# Patient Record
Sex: Male | Born: 1935 | Race: White | Hispanic: No | Marital: Married | Smoking: Never smoker
Health system: Southern US, Community
[De-identification: ages and names within clinical notes are randomized; demographics above are authoritative.]

## PROBLEM LIST (undated history)

## (undated) DIAGNOSIS — M1990 Unspecified osteoarthritis, unspecified site: Secondary | ICD-10-CM

## (undated) DIAGNOSIS — E039 Hypothyroidism, unspecified: Secondary | ICD-10-CM

## (undated) DIAGNOSIS — K219 Gastro-esophageal reflux disease without esophagitis: Secondary | ICD-10-CM

## (undated) DIAGNOSIS — I480 Paroxysmal atrial fibrillation: Secondary | ICD-10-CM

## (undated) DIAGNOSIS — N183 Chronic kidney disease, stage 3 unspecified: Secondary | ICD-10-CM

## (undated) DIAGNOSIS — E611 Iron deficiency: Secondary | ICD-10-CM

## (undated) DIAGNOSIS — D509 Iron deficiency anemia, unspecified: Secondary | ICD-10-CM

## (undated) DIAGNOSIS — J45909 Unspecified asthma, uncomplicated: Secondary | ICD-10-CM

## (undated) DIAGNOSIS — I10 Essential (primary) hypertension: Secondary | ICD-10-CM

## (undated) DIAGNOSIS — I2510 Atherosclerotic heart disease of native coronary artery without angina pectoris: Secondary | ICD-10-CM

## (undated) DIAGNOSIS — I739 Peripheral vascular disease, unspecified: Secondary | ICD-10-CM

## (undated) DIAGNOSIS — D649 Anemia, unspecified: Secondary | ICD-10-CM

## (undated) DIAGNOSIS — I442 Atrioventricular block, complete: Secondary | ICD-10-CM

## (undated) DIAGNOSIS — K2970 Gastritis, unspecified, without bleeding: Secondary | ICD-10-CM

## (undated) DIAGNOSIS — K635 Polyp of colon: Secondary | ICD-10-CM

## (undated) HISTORY — DX: Gastritis, unspecified, without bleeding: K29.70

## (undated) HISTORY — DX: Unspecified asthma, uncomplicated: J45.909

## (undated) HISTORY — DX: Iron deficiency anemia, unspecified: D50.9

## (undated) HISTORY — PX: INSERT / REPLACE / REMOVE PACEMAKER: SUR710

## (undated) HISTORY — DX: Peripheral vascular disease, unspecified: I73.9

## (undated) HISTORY — PX: CATARACT EXTRACTION: SUR2

## (undated) HISTORY — DX: Polyp of colon: K63.5

## (undated) HISTORY — PX: HIP SURGERY: SHX245

---
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
## 1994-10-20 HISTORY — PX: JOINT REPLACEMENT: SHX530

## 1998-04-15 ENCOUNTER — Inpatient Hospital Stay (HOSPITAL_COMMUNITY): Admission: EM | Admit: 1998-04-15 | Discharge: 1998-04-18 | Payer: Self-pay | Admitting: Emergency Medicine

## 1998-08-12 ENCOUNTER — Encounter: Payer: Self-pay | Admitting: Emergency Medicine

## 1998-08-12 ENCOUNTER — Inpatient Hospital Stay (HOSPITAL_COMMUNITY): Admission: EM | Admit: 1998-08-12 | Discharge: 1998-08-15 | Payer: Self-pay | Admitting: Emergency Medicine

## 1999-01-18 ENCOUNTER — Inpatient Hospital Stay (HOSPITAL_COMMUNITY): Admission: EM | Admit: 1999-01-18 | Discharge: 1999-01-21 | Payer: Self-pay | Admitting: Emergency Medicine

## 1999-01-18 ENCOUNTER — Encounter: Payer: Self-pay | Admitting: Emergency Medicine

## 1999-02-13 ENCOUNTER — Ambulatory Visit (HOSPITAL_COMMUNITY): Admission: RE | Admit: 1999-02-13 | Discharge: 1999-02-13 | Payer: Self-pay | Admitting: Neurosurgery

## 1999-12-29 ENCOUNTER — Encounter: Payer: Self-pay | Admitting: Emergency Medicine

## 1999-12-30 ENCOUNTER — Encounter: Payer: Self-pay | Admitting: Emergency Medicine

## 1999-12-30 ENCOUNTER — Inpatient Hospital Stay: Admission: EM | Admit: 1999-12-30 | Discharge: 2000-01-05 | Payer: Self-pay | Admitting: Emergency Medicine

## 1999-12-30 ENCOUNTER — Inpatient Hospital Stay (HOSPITAL_COMMUNITY): Admission: EM | Admit: 1999-12-30 | Discharge: 1999-12-30 | Payer: Self-pay | Admitting: Psychiatry

## 2000-01-01 ENCOUNTER — Encounter: Payer: Self-pay | Admitting: Internal Medicine

## 2000-04-09 ENCOUNTER — Encounter: Payer: Self-pay | Admitting: *Deleted

## 2000-04-09 ENCOUNTER — Inpatient Hospital Stay (HOSPITAL_COMMUNITY): Admission: EM | Admit: 2000-04-09 | Discharge: 2000-04-13 | Payer: Self-pay | Admitting: *Deleted

## 2000-04-12 ENCOUNTER — Encounter: Payer: Self-pay | Admitting: Internal Medicine

## 2003-06-21 ENCOUNTER — Inpatient Hospital Stay (HOSPITAL_COMMUNITY): Admission: EM | Admit: 2003-06-21 | Discharge: 2003-06-22 | Payer: Self-pay | Admitting: Emergency Medicine

## 2003-09-22 ENCOUNTER — Ambulatory Visit (HOSPITAL_COMMUNITY): Admission: EM | Admit: 2003-09-22 | Discharge: 2003-09-22 | Payer: Self-pay | Admitting: Emergency Medicine

## 2003-09-23 ENCOUNTER — Observation Stay (HOSPITAL_COMMUNITY): Admission: EM | Admit: 2003-09-23 | Discharge: 2003-09-24 | Payer: Self-pay | Admitting: Emergency Medicine

## 2007-06-28 ENCOUNTER — Ambulatory Visit: Payer: Self-pay | Admitting: Cardiology

## 2007-06-29 ENCOUNTER — Ambulatory Visit: Payer: Self-pay | Admitting: Internal Medicine

## 2007-06-29 ENCOUNTER — Inpatient Hospital Stay (HOSPITAL_COMMUNITY): Admission: EM | Admit: 2007-06-29 | Discharge: 2007-07-04 | Payer: Self-pay | Admitting: Emergency Medicine

## 2007-07-04 ENCOUNTER — Encounter (INDEPENDENT_AMBULATORY_CARE_PROVIDER_SITE_OTHER): Payer: Self-pay | Admitting: *Deleted

## 2008-07-05 ENCOUNTER — Inpatient Hospital Stay (HOSPITAL_COMMUNITY): Admission: EM | Admit: 2008-07-05 | Discharge: 2008-07-11 | Payer: Self-pay | Admitting: Emergency Medicine

## 2010-10-05 LAB — CBC
HCT: 50.8 % (ref 39.0–52.0)
HCT: 36.1 % — ABNORMAL LOW (ref 39.0–52.0)
HCT: 37.5 % — ABNORMAL LOW (ref 39.0–52.0)
HCT: 41.8 % (ref 39.0–52.0)
Hemoglobin: 16.9 g/dL (ref 13.0–17.0)
Hemoglobin: 12.1 g/dL — ABNORMAL LOW (ref 13.0–17.0)
Hemoglobin: 12.8 g/dL — ABNORMAL LOW (ref 13.0–17.0)
Hemoglobin: 14.1 g/dL (ref 13.0–17.0)
MCHC: 33.2 g/dL (ref 30.0–36.0)
MCV: 89 fL (ref 78.0–100.0)
Platelets: 195 10*3/uL (ref 150–400)
Platelets: 143 10*3/uL — ABNORMAL LOW (ref 150–400)
Platelets: 150 10*3/uL (ref 150–400)
Platelets: 232 10*3/uL (ref 150–400)
RBC: 5.71 MIL/uL (ref 4.22–5.81)
RBC: 4.68 MIL/uL (ref 4.22–5.81)
RDW: 12.3 % (ref 11.5–15.5)
WBC: 24 10*3/uL — ABNORMAL HIGH (ref 4.0–10.5)
WBC: 10.8 10*3/uL — ABNORMAL HIGH (ref 4.0–10.5)
WBC: 5.6 10*3/uL (ref 4.0–10.5)
WBC: 7.3 10*3/uL (ref 4.0–10.5)

## 2010-10-05 LAB — URINALYSIS, ROUTINE W REFLEX MICROSCOPIC
Leukocytes, UA: NEGATIVE
Leukocytes, UA: NEGATIVE
Nitrite: NEGATIVE
Nitrite: NEGATIVE
Protein, ur: 100 mg/dL — AB
Specific Gravity, Urine: 1.015 (ref 1.005–1.030)
Specific Gravity, Urine: 1.018 (ref 1.005–1.030)
Urobilinogen, UA: 0.2 mg/dL (ref 0.0–1.0)
Urobilinogen, UA: 1 mg/dL (ref 0.0–1.0)
pH: 6.5 (ref 5.0–8.0)

## 2010-10-05 LAB — GLUCOSE, CAPILLARY
Glucose-Capillary: 140 mg/dL — ABNORMAL HIGH (ref 70–99)
Glucose-Capillary: 156 mg/dL — ABNORMAL HIGH (ref 70–99)
Glucose-Capillary: 102 mg/dL — ABNORMAL HIGH (ref 70–99)
Glucose-Capillary: 105 mg/dL — ABNORMAL HIGH (ref 70–99)
Glucose-Capillary: 106 mg/dL — ABNORMAL HIGH (ref 70–99)
Glucose-Capillary: 107 mg/dL — ABNORMAL HIGH (ref 70–99)
Glucose-Capillary: 109 mg/dL — ABNORMAL HIGH (ref 70–99)
Glucose-Capillary: 109 mg/dL — ABNORMAL HIGH (ref 70–99)
Glucose-Capillary: 110 mg/dL — ABNORMAL HIGH (ref 70–99)
Glucose-Capillary: 111 mg/dL — ABNORMAL HIGH (ref 70–99)
Glucose-Capillary: 112 mg/dL — ABNORMAL HIGH (ref 70–99)
Glucose-Capillary: 113 mg/dL — ABNORMAL HIGH (ref 70–99)
Glucose-Capillary: 114 mg/dL — ABNORMAL HIGH (ref 70–99)
Glucose-Capillary: 127 mg/dL — ABNORMAL HIGH (ref 70–99)
Glucose-Capillary: 139 mg/dL — ABNORMAL HIGH (ref 70–99)
Glucose-Capillary: 159 mg/dL — ABNORMAL HIGH (ref 70–99)
Glucose-Capillary: 166 mg/dL — ABNORMAL HIGH (ref 70–99)
Glucose-Capillary: 96 mg/dL (ref 70–99)
Glucose-Capillary: 97 mg/dL (ref 70–99)

## 2010-10-05 LAB — DIFFERENTIAL
Basophils Absolute: 0 10*3/uL (ref 0.0–0.1)
Basophils Absolute: 0 10*3/uL (ref 0.0–0.1)
Basophils Relative: 0 % (ref 0–1)
Eosinophils Relative: 0 % (ref 0–5)
Lymphocytes Relative: 2 % — ABNORMAL LOW (ref 12–46)
Monocytes Absolute: 0.7 10*3/uL (ref 0.1–1.0)
Monocytes Absolute: 1 10*3/uL (ref 0.1–1.0)
Neutro Abs: 22.7 10*3/uL — ABNORMAL HIGH (ref 1.7–7.7)
Neutro Abs: 8.8 10*3/uL — ABNORMAL HIGH (ref 1.7–7.7)
Neutrophils Relative %: 95 % — ABNORMAL HIGH (ref 43–77)

## 2010-10-05 LAB — URINE CULTURE
Colony Count: NO GROWTH
Culture: NO GROWTH
Special Requests: NEGATIVE

## 2010-10-05 LAB — BASIC METABOLIC PANEL
BUN: 10 mg/dL (ref 6–23)
BUN: 12 mg/dL (ref 6–23)
BUN: 13 mg/dL (ref 6–23)
BUN: 14 mg/dL (ref 6–23)
CO2: 18 mEq/L — ABNORMAL LOW (ref 19–32)
CO2: 20 mEq/L (ref 19–32)
CO2: 21 mEq/L (ref 19–32)
CO2: 23 mEq/L (ref 19–32)
CO2: 24 mEq/L (ref 19–32)
CO2: 22 mEq/L (ref 19–32)
CO2: 23 mEq/L (ref 19–32)
Calcium: 8 mg/dL — ABNORMAL LOW (ref 8.4–10.5)
Calcium: 8.1 mg/dL — ABNORMAL LOW (ref 8.4–10.5)
Calcium: 8.1 mg/dL — ABNORMAL LOW (ref 8.4–10.5)
Calcium: 7.8 mg/dL — ABNORMAL LOW (ref 8.4–10.5)
Calcium: 8.2 mg/dL — ABNORMAL LOW (ref 8.4–10.5)
Calcium: 9.4 mg/dL (ref 8.4–10.5)
Chloride: 89 mEq/L — ABNORMAL LOW (ref 96–112)
Chloride: 92 mEq/L — ABNORMAL LOW (ref 96–112)
Chloride: 94 mEq/L — ABNORMAL LOW (ref 96–112)
Creatinine, Ser: 1.14 mg/dL (ref 0.4–1.5)
Creatinine, Ser: 1.15 mg/dL (ref 0.4–1.5)
Creatinine, Ser: 0.81 mg/dL (ref 0.4–1.5)
Creatinine, Ser: 0.92 mg/dL (ref 0.4–1.5)
Creatinine, Ser: 0.97 mg/dL (ref 0.4–1.5)
GFR calc Af Amer: >60 mL/min (ref >60)
GFR calc Af Amer: >60 mL/min (ref >60)
GFR calc Af Amer: >60 mL/min (ref >60)
GFR calc Af Amer: >60 mL/min (ref >60)
GFR calc Af Amer: >60 mL/min (ref >60)
GFR calc Af Amer: >60 mL/min (ref >60)
GFR calc Af Amer: >60 mL/min (ref >60)
GFR calc non Af Amer: >60 mL/min (ref >60)
GFR calc non Af Amer: >60 mL/min (ref >60)
GFR calc non Af Amer: >60 mL/min (ref >60)
GFR calc non Af Amer: >60 mL/min (ref >60)
Glucose, Bld: 115 mg/dL — ABNORMAL HIGH (ref 70–99)
Glucose, Bld: 142 mg/dL — ABNORMAL HIGH (ref 70–99)
Glucose, Bld: 160 mg/dL — ABNORMAL HIGH (ref 70–99)
Glucose, Bld: 114 mg/dL — ABNORMAL HIGH (ref 70–99)
Glucose, Bld: 142 mg/dL — ABNORMAL HIGH (ref 70–99)
Potassium: 4 mEq/L (ref 3.5–5.1)
Potassium: 4.1 mEq/L (ref 3.5–5.1)
Potassium: 4.2 mEq/L (ref 3.5–5.1)
Potassium: 4.6 mEq/L (ref 3.5–5.1)
Potassium: 4 mEq/L (ref 3.5–5.1)
Potassium: 4.8 mEq/L (ref 3.5–5.1)
Sodium: 105 mEq/L — CL (ref 135–145)
Sodium: 109 mEq/L — CL (ref 135–145)
Sodium: 118 mEq/L — CL (ref 135–145)
Sodium: 122 mEq/L — ABNORMAL LOW (ref 135–145)
Sodium: 127 mEq/L — ABNORMAL LOW (ref 135–145)

## 2010-10-05 LAB — COMPREHENSIVE METABOLIC PANEL
ALT: 138 U/L — ABNORMAL HIGH (ref 0–53)
ALT: 72 U/L — ABNORMAL HIGH (ref 0–53)
ALT: 86 U/L — ABNORMAL HIGH (ref 0–53)
AST: 63 U/L — ABNORMAL HIGH (ref 0–37)
AST: 92 U/L — ABNORMAL HIGH (ref 0–37)
Albumin: 2.9 g/dL — ABNORMAL LOW (ref 3.5–5.2)
Albumin: 3.1 g/dL — ABNORMAL LOW (ref 3.5–5.2)
Albumin: 3.2 g/dL — ABNORMAL LOW (ref 3.5–5.2)
Alkaline Phosphatase: 116 U/L (ref 39–117)
Alkaline Phosphatase: 67 U/L (ref 39–117)
Alkaline Phosphatase: 70 U/L (ref 39–117)
Alkaline Phosphatase: 88 U/L (ref 39–117)
BUN: 7 mg/dL (ref 6–23)
BUN: 10 mg/dL (ref 6–23)
BUN: 12 mg/dL (ref 6–23)
BUN: 15 mg/dL (ref 6–23)
CO2: 14 mEq/L — ABNORMAL LOW (ref 19–32)
CO2: 22 mEq/L (ref 19–32)
CO2: 27 mEq/L (ref 19–32)
Calcium: 8.4 mg/dL (ref 8.4–10.5)
Calcium: 9.3 mg/dL (ref 8.4–10.5)
Chloride: 96 mEq/L (ref 96–112)
Chloride: 97 mEq/L (ref 96–112)
Creatinine, Ser: 0.97 mg/dL (ref 0.4–1.5)
GFR calc Af Amer: >60 mL/min (ref >60)
GFR calc Af Amer: >60 mL/min (ref >60)
GFR calc non Af Amer: 49 mL/min — ABNORMAL LOW (ref >60)
GFR calc non Af Amer: >60 mL/min (ref >60)
Glucose, Bld: 198 mg/dL — ABNORMAL HIGH (ref 70–99)
Glucose, Bld: 111 mg/dL — ABNORMAL HIGH (ref 70–99)
Glucose, Bld: 116 mg/dL — ABNORMAL HIGH (ref 70–99)
Glucose, Bld: 92 mg/dL (ref 70–99)
Potassium: 5.7 mEq/L — ABNORMAL HIGH (ref 3.5–5.1)
Potassium: 4.1 mEq/L (ref 3.5–5.1)
Potassium: 4.5 mEq/L (ref 3.5–5.1)
Potassium: 5.6 mEq/L — ABNORMAL HIGH (ref 3.5–5.1)
Sodium: 128 mEq/L — ABNORMAL LOW (ref 135–145)
Sodium: 133 mEq/L — ABNORMAL LOW (ref 135–145)
Total Bilirubin: 0.7 mg/dL (ref 0.3–1.2)
Total Bilirubin: 0.9 mg/dL (ref 0.3–1.2)
Total Protein: 7.3 g/dL (ref 6.0–8.3)
Total Protein: 5.8 g/dL — ABNORMAL LOW (ref 6.0–8.3)
Total Protein: 6.5 g/dL (ref 6.0–8.3)

## 2010-10-05 LAB — TSH: TSH: 1.59 u[IU]/mL (ref 0.350–4.500)

## 2010-10-05 LAB — CULTURE, BLOOD (ROUTINE X 2): Culture: NO GROWTH

## 2010-10-05 LAB — HEPATIC FUNCTION PANEL
ALT: 150 U/L — ABNORMAL HIGH (ref 0–53)
AST: 199 U/L — ABNORMAL HIGH (ref 0–37)
Albumin: 4.1 g/dL (ref 3.5–5.2)
Total Protein: 7.8 g/dL (ref 6.0–8.3)

## 2010-10-05 LAB — RENAL FUNCTION PANEL
Albumin: 2.8 g/dL — ABNORMAL LOW (ref 3.5–5.2)
BUN: 12 mg/dL (ref 6–23)
BUN: 9 mg/dL (ref 6–23)
CO2: 25 mEq/L (ref 19–32)
Chloride: 94 mEq/L — ABNORMAL LOW (ref 96–112)
Chloride: 98 mEq/L (ref 96–112)
Glucose, Bld: 111 mg/dL — ABNORMAL HIGH (ref 70–99)
Potassium: 4 mEq/L (ref 3.5–5.1)
Potassium: 5.1 mEq/L (ref 3.5–5.1)
Sodium: 130 mEq/L — ABNORMAL LOW (ref 135–145)

## 2010-10-05 LAB — HEMOGLOBIN A1C: Hgb A1c MFr Bld: 5.8 % (ref 4.6–6.1)

## 2010-10-05 LAB — ETHANOL: Alcohol, Ethyl (B): 10 mg/dL (ref 0–10)

## 2010-10-05 LAB — RAPID URINE DRUG SCREEN, HOSP PERFORMED
Amphetamines: NOT DETECTED
Benzodiazepines: NOT DETECTED
Tetrahydrocannabinol: NOT DETECTED

## 2010-10-05 LAB — URINE MICROSCOPIC-ADD ON

## 2010-10-05 LAB — SODIUM, URINE, RANDOM: Sodium, Ur: 81 mEq/L

## 2010-10-05 LAB — OSMOLALITY, URINE: Osmolality, Ur: 449 mOsm/kg (ref 390–1090)

## 2010-11-03 NOTE — Discharge Summary (Signed)
NAMEBRYDAN, DOWNARD              ACCOUNT NO.:  [id number]   MEDICAL RECORD NO.:  [id number]          PATIENT TYPE:  INP   LOCATION:  5506                         FACILITY:  [facility]   PHYSICIAN:  Peggye Pitt, M.D. DATE OF BIRTH:  1934-08-15   DATE OF ADMISSION:  07/04/2008  DATE OF DISCHARGE:  07/11/2008                               DISCHARGE SUMMARY   DISCHARGE DIAGNOSES:  1. Seizures thought to be secondary to a combination of alcohol      withdrawal plus severe hyponatremia.  2. Severe hyponatremia.  3. Alcohol abuse.  4. Urinary tract infection.  5. Transaminitis.   DISCHARGE MEDICATIONS:  1. Thiamine 100 mg daily.  2. Folate 1 mg daily.  3. Multivitamin 1 tablet daily.   DISPOSITION AND FOLLOWUP:  The patient is discharged home in stable  condition.  He has been aggressively counseled on alcohol cessation.  He  has refused inpatient placement for rehab.  I will ask case manager to  provide him with an AA number prior to discharge.  He has been also  advised to follow up with his primary care physician, Dr. Yetta Flock, in 2-4  weeks.   CONSULTATION ON THIS HOSPITALIZATION:  Dr. Darrick Penna with   [facility].   IMAGES AND PROCEDURES PERFORMED THIS HOSPITALIZATION:  1. Chest x-ray on July 04, 2008, no edema or pneumonia with a      probable hiatal hernia.  2. 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   HISTORY AND PHYSICAL EXAMINATION:  For full details, please refer to  history and physical dictated by Dr. Toniann Fail on July 04, 2008, but  in brief, Mr. Hard is a 76 year old Caucasian man with a known history  of alcohol abuse, who about a year ago was admitted for alcohol  withdrawal seizures, who was found to have tonic-clonic seizures  at  about 3:00 p.m. on day of admission by his wife.  He lost consciousness,  seizure lasted for about a minute, postictal state lasted for about 5  minutes.  He lost his bladder continence.  In the emergency department,  he was found to have a sodium of 103.  For that reason, he was brought  into the emergency department, and we were asked for further evaluation  and management.   HOSPITAL COURSE BY ACTIVE PROBLEMS:  1. Seizures:  He was treated with p.r.n. benzodiazepines.  He was      placed on an alcohol withdrawal protocol.  With correction of his      sodium, he had no further seizure activity.  2. For his severe hyponatremia with an initial sodium of 103, he was      started on hypertonic saline of 3%.  His sodium has since corrected      to 133 on day of discharge.  He has been off IV fluids for  the past      2 days.  He has been aggressively counseled on alcohol cessation as      this is probably the cause of his hyponatremia.  3. Transaminitis, which has been trending down.  I suspect this is      secondary to alcoholic hepatitis, which is resolving.  We recommend      a repeat liver function tests in about 4-6 weeks when he follows up      with his primary care physician.  4. For his urinary tract infection, he has completed a 5-day course of      Cipro for this.  5. For his alcohol abuse, he was placed on thiamine and folate and on      an alcohol withdrawal protocol while in the hospital.  6. Rest of chronic medical issues were not a problem this      hospitalization.   VITAL SIGNS ON DAY OF DISCHARGE:  Blood pressure 108/66, heart rate 90,  respirations 18, O2 sats 95% on room air with a temperature of 98.5.   LABORATORY DATA ON DAY OF DISCHARGE:  Sodium 133, potassium 4.5,  chloride 97, bicarb 29, BUN 15, creatinine 0.93 with a glucose of 92.  Total bili 0.7, alk phos 67, AST 64, and ALT 72.  Total protein 6.5 and  an albumin of 3.2.  WBCs 5.6, hemoglobin 12.8, and a  platelet count of  232.      Peggye Pitt, M.D.  Electronically Signed     EH/MEDQ  D:  07/11/2008  T:  07/12/2008  Job:  [id number]   cc:   Dr. Loretha Brasil  Dr. Darrick Penna

## 2010-11-03 NOTE — Procedures (Signed)
EEG NUMBER:  [id number].   REQUESTING PHYSICIAN:  Eduard Clos, MD   CLINICAL HISTORY:  A 76 year old man admitted with seizure and severe  hyponatremia.  History of alcohol abuse and withdrawal seizures.  EEG is  for evaluation.  The patient is described as awake and confused.  This  is a portable EEG done at the bedside without photic stimulation or  hyperventilation.   INTERPRETATION:  This record is greatly limited by abundant muscle and  movement artifact throughout, but there does appear to be a fairly  persistent low-amplitude waking background rhythm of approximately 9 Hz,  which appears roughly symmetrical in posterior areas.  No gross focal  abnormalities or epileptiform discharges are seen.  The patient appeared  to remain in awake state throughout the recording.  Single channel  devoted EKG is difficult to interpret, but apparently demonstrates an  irregular rhythm with a rate of approximately 72 beats per minute.   CONCLUSIONS:  Normal but very limited study.  There appears to be a low-  amplitude normal background, and no gross abnormalities were seen.  However, repeat EEG when the patient is more able to cooperate is  strongly recommended to more definitively exclude focal abnormalities or  the presence of epileptiform discharges.  A note is also made of an  apparently irregular EKG tracing, although again interpretation is  difficult.  Clinical correlation is strongly recommended.      Michael L. Thad Ranger, M.D.  Electronically Signed     YNW:GNFA  D:  07/05/2008 17:38:22  T:  07/06/2008 06:27:55  Job #:  [id number]

## 2010-11-03 NOTE — Consult Note (Signed)
[name], [name]              ACCOUNT NO.:  [id number]   MEDICAL RECORD NO.:  [id number]          PATIENT TYPE:  INP   LOCATION:  2103                         FACILITY:  [facility]   PHYSICIAN:  Fayrene Fearing L. Deterding, M.D.DATE OF BIRTH:  [birth year]   DATE OF CONSULTATION:  DATE OF DISCHARGE:                                 CONSULTATION   CONSULTING PHYSICIAN:  [facility].   REASON FOR CONSULT:  Severe hyponatremia.   HISTORY OF PRESENT ILLNESS:  This is a 76 year old gentleman with a  history of bilateral total hip replacement, COPD, hypertension, do not  know his medication at this time, history of gallstones, history of  abdominal hernia repair, who also has a long history of alcoholism,  history of seizures in the past, in January 2009 with hyponatremia,  sodium 114 at that time.  He says he drinks at least a 6-pack of beer a  day and is documented in this chart.  He drinks more when he has it.  He  was admitted with seizure on January 14, had a serum sodium 103.  He  cannot tell me his meds except ibuprofen and he does not know the  frequency or duration.  Meds on his records include albuterol and  Flovent.  In the past, he has been on clonidine and labetalol also.   His medications at current time include thiamine, probenecid, folic acid  and albuterol.   REVIEW OF SYSTEMS:  He answers just yes at the current time.   PAST MEDICAL HISTORY:  As listed above is all that is obtainable.   FAMILY HISTORY:  He cannot tell me.   SOCIAL HISTORY:  He is retired.  He does not know where he worked  before.  He apparently smokes, but he cannot tell me how much.   OBJECTIVE:  GENERAL:  He is plethoric,  disoriented.  VITAL SIGNS:  Blood pressure 136/45, temp 98.5, and 98% saturation on 2  L.  I's and O's:  He is putting out about 20-30 cc an hour of urine.  HEENT:  He has a lower denture that has gone at the current time.  Fundi  unremarkable.  NECK:  Without masses or  thyromegaly.  CARDIOVASCULAR:  Irregular with prematures and is sinus by EKG.  Grade  2/6 holosystolic murmur heard best at the apex.  PMI is 10 cm out from  the left midclavicular line in the fifth intercostal space.  Pulses are  2+/4+.  No bruit.  No edema.  LUNGS:  No rales, rhonchi, or wheezes.  Slightly decreased expansion and  decreased breath sounds.  ABDOMEN:  Positive bowel sounds.  The liver is down 5 cm.  Soft and  nontender.  SKIN:  Lot of excoriations on the legs.  NEUROLOGIC:  He is oriented only to person.  His motor is 4/5 and he  will not cooperate.  Cranial nerves II through XII grossly intact.  Deep  tendon reflexes are 1+/4+ and symmetric including upper and lower  extremities.   LABORATORY DATA:  Alcohol level 10 and cortisol 53.  Serum osmolality is  225  and urine osmolality 449.  Urine sodium 81, 0-2 white cells, 0-2 red  cells, and 300 mg of protein, bilirubin 1.8, alk phos 120, SGOT 199, and  SGPT of 150.   ASSESSMENT:  1. Decreased serum sodium secondary to beer drinker's potomania.  He      is not on __________ at this time, but it has __________ probably      also on his diuresis.  Urine osmolality is up a little bit,      probably consistent with __________ delivery.  He is correcting his      serum sodium at current rate that he had one night before changes      were made early this morning.  He has gone from 103 to 109 in      approximately 12 hours.  The current rate of fluid administration      of 3% sodium chloride at 3 cc an hour in 75 cc normal saline were      correct approximately half of his deficit in 24 hours with ongoing      adjustments based on laboratory and clinical status.  Risks are too      rapid of correction.  2. Alcohol abuse.  This is the primary issue that has caused this, and      he is at risk seizure as well as withdrawal.  3. Confusion.  4. Seizures secondary to #1 and #2.  5. Proteinuria on urinalysis.  Repeat that in  about 4-5 days.   PLAN:  1. Adjust his 3% in his normal saline.  2. Follows labs.  3. Prophylaxis procedures.  4. Repeat urinalysis.  5. Thiamine and magnesium.           ______________________________  Llana Aliment Deterding, M.D.     JLD/MEDQ  D:  07/05/2008  T:  07/06/2008  Job:  [id number]

## 2010-11-03 NOTE — Consult Note (Signed)
[name], [name]              ACCOUNT NO.:  [id number]   MEDICAL RECORD NO.:  [id number]          PATIENT TYPE:  INP   LOCATION:  3730                         FACILITY:  [facility]   PHYSICIAN:  Pricilla Riffle, MD, FACCDATE OF BIRTH:  [birth year]   DATE OF CONSULTATION:  07/03/2007  DATE OF DISCHARGE:                                 CONSULTATION   IDENTIFICATION:  The patient is a 76 year old gentleman who we are asked  to see regarding heart block.   HISTORY OF PRESENT ILLNESS:  The patient has no known history of cardiac  problems, question history of hypertension.  He was admitted on June 29, 2007, with an episode of blank stare and decreased level of  consciousness as well as left-sided pain.  He was at home in a recliner  at the time and said he just felt funny.  He asked his wife to get him  an Advil, and that is all he remembers.  He then woke up with EMS  surrounding him.  His wife said that she found with a blank stare when  she returned with the Advil.   He notes occasional dizziness with standing.  No spells like what he had  recently, no syncopal spells.  No chest pain.  He says he walks all day  without a problem.   On admission he was hypertensive, blood pressure over 210 systolic by  the records.  He was placed on Catapres patch TTS-2.  This was  discontinued on January 9, as well as labetalol 100 b.i.d.  This was  decreased to 50 b.i.d. on January 11 and discontinued.   ALLERGIES:  None.   CURRENT MEDICATIONS:  1. Aspirin 81.  2. Ativan 1 mg IV daily.  3. Multivitamin.  4. Nicotine patch.  5. Protonix 40 IV.  6. Sodium chloride IV.  7. Thiamine.   PAST MEDICAL HISTORY:  1. Asthma.  2. EtOH abuse with history of withdrawal in the past.  3. History of hypokalemia and hypomagnesemia secondary to EtOH use.  4. History of GE reflux.  5. Pancytopenia history.  6. History of hypertension.  7. Gallstones.  8. Bilateral hip replacements.  9. Status post  abdominal hernia repair.   SOCIAL HISTORY:  The patient lives in [place].  He is married,  does not smoke.  Drinks about 18 beers per week per his report.  He is a  retired Psychiatrist   FAMILY HISTORY:  Mother died of an MI.  Father died of a stroke at age  66.   REVIEW OF SYSTEMS:  Again, dizziness only when stands quickly.  No  syncope.  Has some cough and wheezing, nonproductive cough.  Arthralgias.  Otherwise, negative to the above problem except as noted  above.   PHYSICAL EXAMINATION:  Currently, the patient is in no acute distress  sitting in a recliner.  Blood pressure 130/65, pulse is 64 and regular,  temperature is 98.2, O2 saturation on room air is 96%.  HEENT:  Normocephalic, atraumatic.  EOMI.  PERRL.  Mucous membranes are  moist.  NECK:  JVP is normal, minus  bruit.  No thyromegaly.  LUNGS:  Bilateral wheezes.  No rales.  Moving air.  CARDIAC EXAM:  Regular rate and rhythm.  S1, S2.  No S3, S4, or murmurs.  PMI not definitely displaced.  ABDOMEN:  Supple, nontender.  No definite hepatomegaly.  Normal bowel  sounds.  No masses.  EXTREMITIES:  Show 2+ distal pulses.  No lower extremity edema.  NEUROLOGIC EXAM:  The patient currently alert and oriented x3.  Moving  all extremities.  Cranial nerves II-XII are grossly intact.  MUSCULOSKELETAL:  No joint deformities.  EXTREMITIES:  No cyanosis, clubbing or edema.     MRI of the brain incomplete, degraded sound because of motion.  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  No acute changes.  A 12-  lead EKG this a.m. shows normal sinus rhythm with type 1 second-degree  AV block.   LABS:  Significant for a hemoglobin of 14, WBC of 12.2.  BUN and  creatinine of 8 and 0.94, potassium of 4.1.  TSH of 2.98.  BNP 194 on  admission.  Note, MB fraction on admission was 39.1, myoglobin greater  than 500, troponin less than 0.05.  Total cholesterol 194, triglycerides  122, HDL of 38, LDL 132.   IMPRESSION:   The patient is a 76 year old gentleman with no known  cardiac problems other than hypertension, admitted with spell, absence-  like, today.  Telemetry with sinus rhythm and type 1 second-degree AV  block and 2:1 AV block.  He was asymptomatic.  Note beta blocker was  discontinued yesterday morning.  He was also placed on Catapres and  labetalol transiently secondary to hypertension.  Examination without  focal findings but found wheezing.  Labs significant for a TSH of 3 and  CK-MB is positive on the point-of-care markers of 39.1 but troponin was  normal at the time, less than 0.05.  Note myoglobin also increased at  greater than 500, probable skeletal.   IMPRESSION:  Bradycardia, no clear association with spell.  Would  continue telemetry.  Get echocardiogram.  Do not treat BP in future with  medications that slow AV conduction.  Can use Norvasc if needed.  His  blood pressure has been good here today.  Lipids, diet consult, EtOH  counseled.  Will continue to follow.  Would do full CK-MB and troponin.      Pricilla Riffle, MD, [facility]  Electronically Signed     PVR/MEDQ  D:  07/03/2007  T:  07/03/2007  Job:  [id number]

## 2010-11-03 NOTE — Procedures (Signed)
EEG NUMBER:  [id number] ordered by Dr. Garnette Czech.   HISTORY:  Premedicated with Valium.  This is a 76 year old man with left-  sided abdominal pain, decreased level of consciousness, episode of rigid  arms and blank stare, question of history of alcoholism, question of  seizure activity.   MEDICATIONS:  1. Ativan.  2. Catapres.  3. Normodyne.  4. Protonix.  5. Reglan.  6. Lopressor.  7. Xopenex.  8. Phenergan.  9. Robitussin.  10.Oxycodone.  11.Zofran.  12.Dilaudid.  13.Valium.   PROCEDURE:  This was a portable 17-channel EEG with 1 channel devoted to  EKG utilizing [employer] 1020 lead placement system.  The patient was  described clinically as being awake and combative.  Electrographically,  he appeared to be awake, possibly even asleep at the beginning of the  study with some central beta formation.  Overall however, the background  is very low amplitude and probably some slowing into the theta range,  but difficult to see because of the very low amplitude.  Again, there  was some beta activity seen chiefly centrally which could be drug  related or could be sleep spindles.  Later towards the of the study,  there was considerable motion artifact and patient movement.  No  definite interhemispheric asymmetries identified however and no definite  epileptiform discharges are seen.  Activation procedures were not  performed.  The EKG monitor reveals relatively regular rhythm with mild  tachycardia at 102 beats per minute.   CONCLUSION:  Mildly abnormal EEG demonstrating low amplitude study with  beta both of which may be seen in sleep or as a medication effect,  particularly with benzodiazepines.  No definite focal abnormalities or  seizure-like episodes are seen during the course of today's recording.  Clinical correlation is recommended.      Catherine A. Orlin Hilding, M.D.  Electronically Signed     VOZ:DGUY  D:  06/30/2007 21:20:52  T:  07/01/2007 11:50:58  Job #:   [id number]

## 2010-11-03 NOTE — H&P (Signed)
[name], [name]              ACCOUNT NO.:  [id number]   MEDICAL RECORD NO.:  [id number]          PATIENT TYPE:  EMS   LOCATION:  MAJO                         FACILITY:  [facility]   PHYSICIAN:  Eduard Clos, MDDATE OF BIRTH:  1935-02-16   DATE OF ADMISSION:  07/04/2008  DATE OF DISCHARGE:                              HISTORY & PHYSICAL   History provided by patient's wife and ER physician and previous  reports.   CHIEF COMPLAINT:  Seizures.   HISTORY OF PRESENT ILLNESS:  A 76 year old male with known history of  bronchial asthma and chronic alcoholism was brought into the ER after  the patient was found to have seizures by his wife.  His wife stated the  patient was suddenly thrown into fits around 3:00 p.m.  He had seizures  where he shook his whole body and lost consciousness.  The whole  incident of seizure lasted around a minute.  He lost consciousness where  around 5 minutes he had incontinence of his urine.  Subsequently, he  started regaining consciousness, he was completely confused.  Patient is  still confused.  He is able to talk somewhat in the last few hours.  Initially, he was not able to do even that.  Patient had a similar  seizure a year ago, which was attributable to his hyponatremia and  possible alcohol withdrawal.  The patient at this time is not  communicative, and most of the history was provided by the patient's  wife.  Patient has not complained of any chest pain or shortness of  breath, abdominal pain, nausea, vomiting, diarrhea, fever, chills, or  headache.  After this incident, the patient has no weakness of the  limbs.  He is sable to move all of his extremities.   In the ER, the patient was found to have a sodium of 103 and is  presently getting fluids and is being admitted for further evaluation  and management of his seizure and severe hyponatremia.  Patient's wife  also states that he drinks a lot of alcohol every day along with some  water.  He does drink a lot of water, per patient's wife.  He drinks  around 5-6 cans of beer every day.   PAST MEDICAL HISTORY:  1. History of seizures a year ago.  2. Bronchial asthma.   PAST SURGICAL HISTORY:  1. Left hip replacement.  2. Abdominal hernia repairs.   MEDICATIONS PRIOR TO ADMISSION:  1. Albuterol p.r.n. HFA.  2. Flovent 2 puffs every morning.   ALLERGIES:  No known drug allergies.   SOCIAL HISTORY:  Patient lives with his wife.  He does not smoke  cigarettes, drinks around 5-6 cans of beer every day.  He has no drug  abuse.   FAMILY HISTORY:  Nothing is contributory.   REVIEW OF SYSTEMS:  As in the history of present illness, nothing else  significant.   PHYSICAL EXAMINATION:  Patient is examined at the bedside.  Appears  confused.  VITAL SIGNS:  Blood pressure is 130/47, pulse 80 per minute, temperature  98.9, respirations 18 per minute, O2 sat 96%.  HEENT:  Anicteric.  No facial asymmetry.  PERRLA positive.  No neck  rigidity.  CHEST:  Bilateral air entry present.  No rhonchi, no crepitation.  HEART:  S1 and S2 heard.  ABDOMEN:  Soft.  Nontender.  Bowel sounds heard.  CNS:  Patient is alert but confused.  Does not follow commands.  Moves  upper and lower extremities 5/5.  EXTREMITIES:  Peripheral pulses felt.  No edema.   LABS:  Chest x-ray reveals a right central line deep in the right  atrium, 5 cm deep to the cavoatrial junction.  No new pneumothorax.  A  pleural density in the left retrocardiac region may be related to a  hiatal hernia but recommend a two-view.   Initial chest x-ray:  Low volume film with edema or __________  pneumonia, probable hiatal hernia.   CT head without contrast shows no acute intracranial abnormality.   CBC:  WBC is 24,000, hemoglobin 16.9, hematocrit 50.8, platelets 195,  neutrophils 95%.  Glucose 243.  Complete metabolic panel:  Sodium 103,  potassium 5.6, chloride less than 70, carbon dioxide 14, glucose 198,   BUN 7, creatinine 1.4, AST 185, ALT 138, total bilirubin 2, calcium 8.4,  alkaline phosphatase 116, albumin 3.9.  Drug screen negative.  Alcohol  level 10.  UA shows large blood, ketones 15, wbc's 0, leukocytes  negative, nitrites negative, cultures pending.   ASSESSMENT:  1. Severe hyponatremia.  2. Seizure, probably from severe hyponatremia and also alcohol      withdrawal.  3. Leukocytosis, probably reactionary.  4. Encephalopathy, probably from postictal state.  5. History of bronchial asthma.   PLAN:  We will admit patient to ICU.  Place patient on seizure  precautions.  Neuro checks.  After calculating patient's sodium deficit,  __________ deficit, and taking into consideration patient's seizure and  encephalopathy state, will start patient on 3% sodium chloride, wherein  we have increased his sodium by not more than 10 mg the first 24 hours  and 18 mg the next 48 hours.  We will do frequent BMET check every 4  hours to insure that it is not rapidly corrected or is not corrected.  Will get blood cultures, urine cultures.  We will ask the ER physician  to pull the central line a little above the right atrium.  I discussed  with the patient's wife about the critical nature of the patient's  condition.  We will get MRI, EEG, and probably need a neurology consult  in the a.m.      Eduard Clos, MD  Electronically Signed     ANK/MEDQ  D:  07/04/2008  T:  07/05/2008  Job:  [id number]

## 2010-11-03 NOTE — H&P (Signed)
[name], [name]              ACCOUNT NO.:  [id number]   MEDICAL RECORD NO.:  [id number]          PATIENT TYPE:  INP   LOCATION:  1828                         FACILITY:  [facility]   PHYSICIAN:  Elliot Cousin, M.D.    DATE OF BIRTH:  [birth year]   DATE OF ADMISSION:  06/28/2007  DATE OF DISCHARGE:                              HISTORY & PHYSICAL   PRIMARY CARE PHYSICIAN:  Dr. Yetta Flock in [place].   CHIEF COMPLAINT:  Left-sided abdominal pain, decreased in the level of  consciousness and an episode of rigid arms and a blank stare.   HISTORY OF THE PRESENT ILLNESS:  The patient is a 76 year old man with a  past medical history significant for alcoholism, status post bilateral  hip replacements and asthma.  He presents to the emergency department  with the chief complaint of left-sided pain.  The history is provided  mostly by the patient's wife, Mrs. Crom.  According to Mrs. Canty the  patient has not felt well over the past 3-4 days.  Earlier yesterday he  complained of left-sided abdominal pain.  He had not been eating or  drinking very much.  The patient, however, had been drinking beer,  approximately four to six beers daily by Mrs. Mcmullen's account. He  requested Advil for the pain  She went to the medicine cabinet to get  the Advil; however, when she came back the patient had a blank stare on  his face.  His eyes were glassy.  All of a sudden his arms extended out  in front of him rigid and with some shaking.  The rigid arms and blank  stare lasted for approximately 15 seconds.  Then he became unresponsive  for approximately 15 minutes.  Mrs. Hukill called the EMS and when the  EMS arrived he became more alert.  The patient was subsequently brought  to the emergency department at [facility].  During his stay in the emergency department the patient had one episode  of a coffee ground-like emesis.   At home the patient had had several days of diarrhea, nausea and  vomiting.  No bright red blood per rectum, no black tarry stools and no  coffee ground emesis at home.  The patient states that the abdominal  pain has subsided; however, he now has some left arm pain after the IV  was placed/inserted.   During the evaluation in the emergency department the patient was noted  to be hypertensive.  A number of studies were ordered by the emergency  department physician.  The CT scan of the head revealed a small  amount  of high density along the left frontal convexity, blood versus artifact.  His lab data were significant for a serum sodium of 114, WBC of 12.2,  glucose of 147 and an SGOT of 61.  The patient will be admitted for  further evaluation and management.   PAST MEDICAL HISTORY:  1. Asthma.  2. Alcoholism with a history of alcohol withdrawal symptoms in 2001      and 2005.  3. Hypokalemia, hypomagnesemia and pancytopenia all secondary to  alcohol abuse in 2001-2005.  4. Gastroesophageal reflux disease.  5. History of hypertension.  6. History of gallstones.  7. Status post bilateral hip replacements and subsequent ORIF of the      right hip arthroplasty.  8. Status post abdominal hernia repairs in the past.   MEDICATIONS:  1. Albuterol MDI as needed.  2. Flovent 2 puffs every morning.   ALLERGIES:  No known drug allergies.   SOCIAL HISTORY:  The patient is married.  [place]  He has two children.  He is semi retired.  He denies  tobacco use.  He drinks six beers daily and sometimes less.  He seldom  drinks hard liquor anymore.  He acknowledges that he does not drink as  much as he used to.   FAMILY HISTORY:  The patient's father died of a stroke.  His mother died  of a heart attack.   PHYSICAL EXAMINATION:  VITAL SIGNS:  Temperature 97.9, blood pressure  187/107, pulse 74, respiratory rate 18, and oxygen saturation 99% on  room air.  GENERAL APPEARANCE:  In general the patient is a 76 year old  obese  Caucasian man who is currently lying in bed in no acute distress,  although he does appear ill.  HEENT:  The head is normocephalic and atraumatic.  Pupils are equal,  round and react to light.  Extraocular movements are intact.  Conjunctivae are clear.  Sclerae are white.  Nasal mucosa is dry.  No  sinus tenderness.  Oropharynx reveals dentures.  Mucous membranes are  dry.  No posterior exudates or erythema.  NECK:  The neck is supple.  No adenopathy.  No thyromegaly.  No bruits.  No JVD.  LUNGS:  The lungs have decreased breath sounds in the bases, otherwise  clear.  HEART:  The heart reveals S1 and S2 with no murmurs, rubs or gallops.  ABDOMEN:  The abdomen is obese with positive bowel sounds.  Soft,  nontender and nondistended.  No hepatosplenomegaly. No masses palpated.  There is only mild left-sided CVA tenderness.  GENITALIA AND RECTAL:  Genitourinary and rectal exams are deferred.  EXTREMITIES:  In the extremities the pedal pulses are palpable  bilateral.  No pretibial edema and no pedal edema.  The left arm has  some tenderness at the antecubital area at approximately the IV site;  however, there is no surrounding erythema, edema or swelling.  There is  no effusion at the joint.  NEUROLOGIC EXAMINATION:  Neurologically the patient is alert and  oriented times two.  Cranial nerves II-XII are intact.  The patient does  appear tremulous.  Strength is 5/5 throughout.  Sensation is intact.   LABORATORY DATA:  Admission laboratories;  EKG reveals normal sinus  rhythm with a first degree A-V block and a heart rate of 96 beats per  minute.  Sodium 114 and repeat was 117 after 2 liters of normal saline,  potassium 4.4, chloride 81, CO2 22, glucose 147, BUN 6, and creatinine  0.85. Total bilirubin 0.7, alkaline phosphatase 102, SGOT 61, SGPT 31,  total protein 6.7, albumin 3.5, and calcium 7.6.  CK/MB 39.1, troponin I  less 0.05 and myoglobin greater than 500.  WBC 12.2,  hemoglobin 14.1 and  platelets 235,000.   ASSESSMENT:  1. Severe hyponatremia.  The patient's presenting serum sodium was      114.  More than likely the hyponatremia is secondary to a      combination of alcohol abuse, vomiting,  diarrhea, and generalized      poor by mouth intake otherwise.  2. Probably seizure activity.  The patient's wife disclosed symptoms      consistent with a possible seizure.  More than likely the patient      did have a seizure in the setting of severe hyponatremia and      possible alcohol abuse without recent beer intake over the past 12-      18 hours.  3. Nonspecific left-sided abdominal pain.  The patient's abdominal      examination is benign.  4. Questionable blood versus artifact per computerized tomographic      scan of the head.  5. Nausea, vomiting and diarrhea.  There is a question of hematemesis      in the emergency department.  The patient probably has an      underlying gastroenteritis; however, the patient certainly has risk      factors for acute gastritis, peptic ulcer disease and esophagitis.  6. Left arm pain.  The x-ray of the left humerus was negative.   PLAN:  1. The patient will be admitted for further evaluation and management.  2. The patient received 2 liters of normal saline in the emergency      department.  We will continue volume repletion with normal saline      at 150 mL an hour.  For further evaluation we will check a BMET      every six hours, amylase, lipase and abdominal x-ray.  3. For workup of the hyponatremia we will check a serum cortisol      level, uric acid, TSH and osmolalities.  4. We will start the Ativan protocol and vitamin therapy.  5. Seizure precautions.  6. Protonix intravenously every 12 hours.  7. We will start a clear liquid diet only for now.  8. Blood pressure management with clonidine and labetalol.  9. For further evaluation we will check a follow up CT scan of the      head and obtain an  EEG.      Elliot Cousin, M.D.  Electronically Signed     DF/MEDQ  D:  06/29/2007  T:  06/29/2007  Job:  [id number]

## 2010-11-03 NOTE — Consult Note (Signed)
[name], [name]              ACCOUNT NO.:  [id number]   MEDICAL RECORD NO.:  [id number]          PATIENT TYPE:  INP   LOCATION:  3730                         FACILITY:  [facility]   PHYSICIAN:  Pramod P. Pearlean Brownie, MD    DATE OF BIRTH:  [birth year]   DATE OF CONSULTATION:  07/03/2007  DATE OF DISCHARGE:                                 CONSULTATION   REASON FOR REFERRAL:  Possible seizure.   HISTORY OF PRESENT ILLNESS:  [name] is a 76 year old Caucasian male  with known medical history of chronic alcoholism, asthma, hypertension,  bilateral hip replacement who presented to the emergency room, brought  by EMS following an episode when he did not feel well, had some nausea,  vomiting, did not eat well for the past two or three days.  His wife  noted that he would tend to drink beer despite eating anything.  He told  his wife he has some abdominal pain and when she was to the kitchen to  bring him some medicine upon returning, noted that he had a blank stare  and glassy eyes.  He extended his arms in front of him and started  jerking them for few minutes and then it took him around 15 minutes to  slowly start recovering to his baseline.  He remained confused and had  some speech difficulties by the time EMS got there.  He complained of  some left shoulder pain on arrival here.  He had some mild cough.  His  admission sodium was 114 and it was repeated and was found to be 117.  He has no known prior history of seizures or similar episodes.  No  history of subdural hematoma or strokes.   PAST MEDICAL HISTORY:  As stated above.   PAST SURGICAL HISTORY:  1. Bilateral hip replacement.  2. Abdominal hernia repair.   HOME MEDICATIONS:  Flovent and albuterol.   SOCIAL HISTORY:  The patient admits to drinking three to four beers  every day, more on weekends.  He does not smoke.  He lives in  [place], [place], with his wife.  He is semi-retired.   REVIEW OF SYSTEMS:  As  documented above.   PHYSICAL EXAMINATION:  GENERAL APPEARANCE:  A middle-aged Caucasian male  who is, at present, not in distress.  VITAL SIGNS:  He is afebrile today with temperature 98.2, pulse rate 64  per minute, regular sinus, respiratory rate 18 per minute, blood  pressure 130/65, Sats 96% on room air.  HEENT:  Head is nontraumatic.  Neck is supple.  There is no bruit.  ENT  exam unremarkable.  CARDIAC:  Regular heart sounds.  NEUROLOGIC:  The patient is awake, alert and oriented to time, place and  person.  There is no aphasia, apraxia or dysarthria.  He is slow to  respond to questions and has a flat affect, but follows commands quite  well.  Face is symmetric.  Tongue is midline.  Palatal movements normal.  Motor system exam reveals no upper extremity drift.  He has symmetric  strength, tone, reflexes.  He has a broad-based,  slightly ataxic gait.  His left plantar is upgoing, right is downgoing.   LABORATORY DATA:  Today sodium is up to 135.  On admission, it was 117  and 114.  His RPR is negative.  B12 is normal.  Cortisol level was 22.6.  Hemoglobin A1c is 5.6.  THS is __________ .   CT scan on admission showed a questionable area of hyperdensity in the  left frontal region, query artifact.  An MRI scan was obtained but was  suboptimal due to motion artifacts, however, shows no acute abnormality.  There are questionable remote age right frontal infarct may be seen but  is not definite.   EEG dated June 30, 2007, shows low amplitude waves with excessive  beta activity.   IMPRESSION:  A 72-year gentleman with single episode of what sounds like  a complex partial seizure with and without secondary generalization in  the setting of severe dehydration and chronic alcoholism.   PLAN:  I agree with correction of his reversible medical parameters.  I  do not believe anticonvulsant therapy is indicated at the present time.  Counseled him to quit smoking and advised him not  to drive for the next  3-6 months as mandated by [employer].  If he continues to have  seizures which are nonalcohol-related or has an abnormal EEG or MRI upon  follow-up, may reconsider.  Kindly call for questions.           ______________________________  Sunny Schlein. Pearlean Brownie, MD     PPS/MEDQ  D:  07/03/2007  T:  07/03/2007  Job:  [id number]

## 2010-11-06 NOTE — H&P (Signed)
[facility]  Patient:    [name], [name]                     MRN: [id number] Adm. Date:  [id number] Attending:  Miguel Aschoff Dictator:   Eduard Roux, N.P.                         History and Physical  IDENTIFYING INFORMATION:  The patient is a 76 year old married white male voluntarily admitted for alcohol dependency and is requesting detox.  HISTORY OF PRESENT ILLNESS:  The family brought the patient to the emergency room stating that they had been trying to detox him from alcohol for the past three weeks, and have been staying with him around the clock.  He was unable to go without alcohol for greater than than 15 minutes at that time and, according to the family, had been drinking two pints of vodka a day x 3 weeks prior to this.  The patients history was greater than that.  His last drink was on December 30, 1999.  Additionally, the patient fell one week ago while drinking, and received a fractured left ankle and did not know that it was fractured.  This was treated and splinted in the emergency room on December 30, 1999.   The patient was extremely agitated while being evaluated in the emergency room.  He was requesting alcohol and attempting to leave.  He presents today extremely tremulous, very anxious.  The family and the patient report decreased sleep, only four hours per night, that he has not eaten in three weeks.  He is denying suicidal ideation and homicidal ideation.  The family and the patient initially state that the patient has no history of seizures with alcohol cessation.  However, the patient later disclosed to this writer that perhaps he had had a small one.  The patient appears quite ill and is difficult to participate in treatment.  A great deal of this H&P has been gleaned from the assessment process on December 30, 1999.  PAST PSYCHIATRIC HISTORY:  The patient has had three previous detox admissions at [facility]  [facility], [facility] and [facility] in the year 2000.  SOCIAL HISTORY:  He has been married for 45 years and lives with his wife.  he has extremely supportive children and, as noted in the HPI, presented to the emergency room in the company of his son and daughter-in-law.  FAMILY HISTORY:  Both parents were alcoholics.  Bother is in recovery.  ALCOHOL AND DRUG HISTORY:  The patient has been drinking since the age of 54.  His longest period of sobriety has been seven years.  He is unable to articulate when that was.  As noted in the HPI, he feels extremely ill. Questionable history of seizures and DTs with cessation.  PAST MEDICAL HISTORY:  PRIMARY CARE PHYSICIAN:  Unable to ascertain at this point.  MEDICAL PROBLEMS:  Hypertension.  Asthma.  bilateral hip replacements in 1988. Reflux.  MEDICATIONS:  Albuterol MDI inhaler.  Zestril 10 mg.  DRUG ALLERGIES:  No known drug allergies.  PHYSICAL EXAMINATION:  Physical examination was performed at [facility] Emergency Room with significant findings of left ankle fracture that was splinted and Ace wrapped secondary to a fall that he sustained one week ago. He is extremely tremulous and having diarrhea.  He received 2 L of IV fluid while in the hospital, 100 mg of  Librium while in the emergency room, multivitamin, 1 mg of folate, 40 mEq potassium and thiamine 100 mg.  His I-STAT was drawn and revealed a potassium of 3.1.  His blood alcohol level was 382.  LABORATORY DATA:  Laboratory values drawn on December 30, 1999 in the morning reveal pancytopenia, white blood cells at 1.9, red blood cells 3.23, hemoglobin 9.8, hematocrit 29.  MCV 89 and platelets were 67.  MENTAL STATUS EXAMINATION:  Disheveled white male.  He exhibits gross body tremors.  His speech is halting and relevant.  His mood identify very irritable and anxious.  He appears very ill.  Thought processes are coherent. There is no evidence  that he is attending internal stimuli or tactile hallucinations.  His cognitive function is unable to ascertain at present.  he presents with impaired insight and judgement.  ADMITTING DIAGNOSES: Axis I:    Alcohol dependency. Axis II:   None. Axis III:  1. Asthma.            2. Hypertension.            3. History of left ankle fracture one week ago.            4. History of bilateral hip replacements. Axis IV:   Severe related to psychosocial problems with alcohol dependence and            medical problems. Axis V:    Current global assessment of functioning 20.  Highest in the past            year 60.  TREATMENT PLAN AND RECOMMENDATIONS:  The patient was voluntarily admitted to [facility], 15 minute checks for safety were provided as well as fall precautions.  The patient will be transferred to [facility] Emergency Room on December 30, 1999 in the morning for evaluation of pancytopenia. The patient is at higher risk for detox and perhaps will need IV fluids as well as IV Ativan in order to assist with detox.  The patient was initially started on a high Librium protocol.  We attempted to force fluids.  PENDING LABORATORY VALUES:  TSH, T4, T3 and CMET.  TENTATIVE LENGTH OF STAY AND DISCHARGE PLAN:  Transfer to [facility] Emergency Room.  Will ascertain after that if the patient is admitted.  The patient is to follow up with [facility] in 3-4 days. DD:  12/30/99 TD:  12/30/99 Job: [id number]

## 2010-11-06 NOTE — H&P (Signed)
[place]. [facility]  Patient:    [name], [name]                     MRN: [id number] Adm. Date:  [id number] Disc. Date: [id number] Attending:  Miguel Aschoff Dictator:   Jetty Duhamel, M.D. CC:         Charlott Rakes, M.D., [place], [place]   History and Physical  DATE OF BIRTH: 1935/04/18  CHIEF COMPLAINT: Alcohol withdrawal/tremors.  HISTORY OF PRESENT ILLNESS: Mr. Donald is a 76 year old Caucasian male with a previous history significant for alcoholism.  He has been seen multiple times in the emergency room and has required admission on prior occasional due to alcohol related problems.  Tonight the patient was found at home by his son shaking uncontrollably and he stated that he desired to be seen by a physician.  The son reports that the patient has been on a "binge" for the last four to five weeks.  He does state that he believes his father has been trying to cut back on his drinking and that he has noticed he has drank considerably less over the past two days.  The son states his father has become so weak that he is unable to get his own alcohol and therefore his recent absence may have been involuntary.  The patient at this time is heavily sedated with Ativan given by the emergency room physician and therefore is unable to provide his own history.  The son does report it is commonplace for the patient to stay at home and drink heavily and to suffer multiple falls while doing so.  The patient is apparently married but his wife is out of town at a business meeting and has gone for approximately three weeks now.  The son is not aware of his father losing consciousness.  REVIEW OF SYSTEMS: Unable to obtain Review Of Systems from the patient because he is sedated with Ativan at this time.  The son states his father had had no complaints until tonight, as detailed in the History of Present Illness.  PAST MEDICAL HISTORY:  1. Alcoholism.   The son reports the patient has been drinking since the age     of [age] as his family used alcohol to treat his "asthma".  He has been     prone to binge drinking for approximately three to four years now, but     apparently was very functional and "not an alcoholic" prior to the last     three years, per the son.  The patient required admission in July 2000     secondary to alcohol withdrawal and a left temporal cerebral contusion     secondary to an alcohol related fall.  2. History of hypertension.  3. History of gastroesophageal reflux disease.  CT demonstrated thickening     at the GE junction of unknown etiology in July 2000.  4. Bilateral hip osteoarthritis requiring bilateral hip replacements at     unknown date.  5. Status post abdominal herniorrhaphy.  6. "Asthma", per son.  7. History of hypertension.  MEDICATIONS:  1. Zestril 20 mg p.o. q.d.  2. Flovent 110 mcg 3-4 puffs p.r.n. - ?  3. Prevacid 15 mg p.o. q.d.  Medications noted from discharge in July 2000 - family unaware of medications.  ALLERGIES: No known drug allergies per old medical records.  FAMILY HISTORY: Per old medical records the patients mother died in her 76s from myocardial  infarction.  Father died in his 101s from myocardial infarction.  The patient has one brother with prostate cancer and a different brother with bladder cancer.  SOCIAL HISTORY: The patient lives in [place], [place], [place] outside of [place], [place].  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  He is married.  He does not smoke but he does use snuff occasionally.  PHYSICAL EXAMINATION:  VITAL SIGNS: Temperature 99.1 degrees, blood pressure 111/72, heart rate 132, respiratory rate 22.  GENERAL: Disheveled appearing 76 year old Caucasian male, who is not responding to questions and is quite somnolent secondary to high-dose Ativan.  HEENT: The patient has  prominent bilateral black eyes, with multiple cutaneous abrasions of varying ages about the forehead and zygomatic arches.  There is no evidence of broken nose.  PERRLA.  I am unable to test extraocular movements.  The left conjunctiva is injected and the right is unremarkable. There is no epistaxis and nasal mucosa is pink and moist.  There is no hemotympanum and tympanic membranes are clear without bulging or discharge bilaterally.  OC/OP clear.  Poor dental hygiene.  NECK: No lymphadenopathy, no thyromegaly.  LUNGS: Clear to auscultation bilaterally, though breaths are shallow, with no appreciable wheeze.  CARDIOVASCULAR: Tachycardic at approximately 120 beats per minute, with 2/6 systolic flow-type murmur, with no gallop or rub.  ABDOMEN: Thin, diffusely tender, soft.  Bowel sounds present.  No organomegaly.  No rebound.  No ascites.  No spider angioma.  EXTREMITIES: Poorly kept nails, with multiple fractured nails and lost nails of upper and lower extremities.  Femoral and radial pulses 2+ bilaterally with trace dorsalis pedis pulses bilaterally in lower extremities.  No edema or cyanosis.  CUTANEOUS: The patient has multiple ecchymoses of varying ages, with the most prominent being about his left acromion process, as well as a large ecchymotic area involving the majority of the lateral aspect of the right thigh.  GU: The patient has a rash in the groin consistent with tinea.  Uncircumcised, normal male genitalia.  NEUROLOGIC: The patient is unable to cooperate with neurologic examination.  I am unable to check cranial nerves or extremity strength.  The patient is moving all four extremities spontaneously intermittently.  There is clonus at the ankles bilaterally.  DTRs are 1+ and symmetric throughout, and the patient does withdraw to pressure at nail beds in bilateral upper and lower extremities.  LABORATORY DATA: Alcohol level is 15.  Hemoglobin 12, WBC 4.1,  platelets 103,000; MCV 86.  Sodium 130, potassium 4.4, chloride 95, CO2 16, BUN 8,  creatinine 0.8.  Calcium 7.4, glucose 114.  Total protein 5.7, albumin 2.8. AST 70, ALT 26.  Alkaline phosphatase 130, total bilirubin 1.4.  CT of the head per the ER physician was read as showing no acute disease with generalized cerebral atrophy.  Cervical spine film reading per radiologist as reported by ER physician shows evidence of old C6 fracture with no acute disease.  IMPRESSION/PLAN:  1. Alcohol withdrawal.  The patient clearly is in symptoms of alcohol     withdrawal.  This likely is secondary to the fact that he has become so     weak from malnutrition as evidenced by his albumin of 2.8 that he has been     unable to obtain his own alcohol.  Alcohol level of 15 suggests that the     patient has not had anything to drink for quite a while in that on  previous admissions his standard alcohol level seems to be around 400.  We     will treat symptoms with Ativan in that Librium is currently not available     in the hospital.  Additionally, we will dose him with intravenous fluids     in the form of D5 normal saline with supplemental potassium to assure that     he maintains appropriate hydration and intravascular volume.  Supplemental     thiamine and folate will be given and gastrointestinal prophylaxis will     also be dosed.  We will maintain the patient in a telemetry bed so as to     monitor and assure that no cardiac arrhythmias are encountered.     Additionally, he will be placed on seizure precautions.  Despite his     multiple contusions and abrasions physical examination as well as     radiologic examination failed to reveal any evidence of fractured bones.     As the patient begins to become more conscious we will counsel him     extensively on the detrimental effects of ongoing alcohol abuse.  If the     patient is willing we will consult Dr. Jeanie Sewer for more thorough      evaluation and recommendations for future treatment strategies.  2. Gastroesophageal reflux disease.  Will treat the patient with intravenous     Protonix while his mental status is diminished and change him to p.o. form     when he is awake and tolerating this.  At that time we will further     question him as to work-up of his CT scan results from July 2000 which     revealed a thickening at the gastroesophageal junction.  It is concerning     that this could represent an esophageal cancer and I am not able to     interview the patient for symptoms of such at this time due to his     depressed mental status.  3. Hypertension.  Will treat the patient with intravenous metoprolol on a     p.r.n. basis.  At this time I do not expect that hypertension will be     a problem but with withdrawal we must watch this closely.  Will change to     p.o. medications as soon as the patient is alert and tolerating such.  4. Asthma.  There is no evidence of asthma at this time and certainly no     active wheezing.  We will continue to follow examination and treat on a     p.r.n. basis. DD:  04/10/00 TD:  04/10/00 Job: [id number]

## 2010-11-06 NOTE — Consult Note (Signed)
[facility]  Patient:    [name], [name]                     MRN: [id number] Proc. Date: 01/01/00 Adm. Date:  [id number] Attending:  Miguel Aschoff CC:         R. Valma Cava, M.D.             Miguel Aschoff, M.D.                          Consultation Report  HISTORY OF PRESENT ILLNESS:  Mr. Ciano is a 76 year old male currently admitted to Dr. Charissa Bash for alcohol withdrawal, pancytopenia and a left distal fibula fracture. There was no clear cut history of the patients fall that could be documented secondary to the fact that he has been on a drinking binge for quite some time; however, it is estimated that he may have fallen over a week ago. Nevertheless, he was brought to [facility] on July 10 and was found to be in delirium tremens and was x-rayed and showed a left distal fibula fracture. He was splinted. He has seen Dr. Montez Morita in the past and my office was called.  The patient now complains a little bit of left ankle pain but not bad.  PHYSICAL EXAMINATION:  His splint is removed. He has some resolving ecchymosis which appears to be more than just 32 days old. A little bit of tenderness of the distal fibula, nontender medially. Good general range of motion, good pulses, sensation to light touch.  X-rays show essentially a minimally displaced Weber type B distal fibula fracture with ankle mortis being symmetric and small avulsion off the talus.  IMPRESSION:  Left distal fibula fracture small bulging off talus, acceptable position.  RECOMMENDATION:  More than likely this patient has been walking on this for over a week before the x-rays were made, therefore, I think it is probably stable.  Today we will place him into a well molded short leg fiberglass cast and allow him to weightbear as tolerated at the decision of physical therapy. This would normally be treated as an outpatient. I recommend follow-up in the office  with x-rays in a couple of weeks. Pain medicine of choice Darvocet-N 100. Follow the patient as an outpatient after his discharge. Please make sure the discharge plan is arranged. Follow-up as noted above. Thank you for allowing [place] to see this patient in consult. DD:  01/01/00 TD:  01/01/00 Job: [id number]

## 2010-11-06 NOTE — Op Note (Signed)
NAME:  [name], [name]                        ACCOUNT NO.:  [id number]   MEDICAL RECORD NO.:  [id number]                   PATIENT TYPE:  INP   LOCATION:  0109                                 FACILITY:  [facility]   PHYSICIAN:  Madlyn Frankel. Charlann Boxer, M.D.               DATE OF BIRTH:  1935/02/05   DATE OF PROCEDURE:  09/23/2003  DATE OF DISCHARGE:                                 OPERATIVE REPORT   PREOPERATIVE DIAGNOSIS:  Posteriorly dislocated right total hip replacement.   POSTOPERATIVE DIAGNOSES:  1. Posterior dislocation, right total hip replacement.  2. Weber-B right ankle fracture, stable.   PROCEDURE:  1. Closed reduction, right total hip replacement.  2. Closed reduction and application of splint, right ankle.   ANESTHESIA:  General.   COMPLICATIONS:  None apparent.   ASSISTANT:  None.   INDICATIONS FOR PROCEDURE:  [name] is a 76 year old white male with a  significant history of alcoholism who is status post right total hip  replacement by Dr. Montez Morita unknown years ago per his wife.  The patient is  also status post revision left total hip replacement, unknown date.   The patient was seen and evaluated as early as April 3rd, which was  yesterday, for dislocation of the same hip.  He underwent closed reduction  from Dr. Lequita Halt.  The patient was placed into a knee immobilizer and  discharged to home.  The patient drank approximately a fifth of liquor at  home and dislocating it and presented to the ER the morning of April 4.  Upon evaluation, the patient was noted to have painful swelling of the right  ankle with an unknown history of incidence.  He had been given medication  that subdued his temperament.  However, he was sitting in a flexed and  internally rotated position indicating a posterior dislocation.  Radiographic evaluation indicated posterior hip dislocation without  periprosthetic branch or loosening of components.  No ankle films were  obtained.   The  patient's wife was available for consent for closed reduction.   PROCEDURE IN DETAIL:  The patient was brought to the operative theatre.  Once adequate anesthesia was established, the patient was positioned supine  on the operating table.  Fluoroscopic images of the ankle revealed a well-  perfused type ankle pattern which was stable through an external rotation  stress.  Under general anesthesia, the patient's hip was reduced without  complication or difficulty.  Fluoroscopic imaging of the hip revealed a  stable hip.  Stable range of motion at 90 degrees of hip flexion  and  internal rotation to about 20 degrees, the hip appeared stable.   At this point, the patient's right ankle was placed into a posterior simple  sugar tone splint.  The patient's knee was placed in a knee immobilizer and  abduction pillow placed.   The patient will be admitted for observation with 1 mg of Ativan given  q.8h.  in addition to his pain medications.                                               Madlyn Frankel Charlann Boxer, M.D.    MDO/MEDQ  D:  09/23/2003  T:  09/23/2003  Job:  [id number]

## 2010-11-06 NOTE — Discharge Summary (Signed)
[facility]  Patient:    [name], [name]                     MRN: [id number] Adm. Date:  [id number] Disc. Date: [id number] Attending:  Miguel Aschoff                           Discharge Summary  ADMISSION DIAGNOSES: Axis I:    Alcohol dependence. Axis II:   None. Axis III:  1. Asthma.            2. Hypertension.            3. History of left ankle fracture one week ago.            4. History of bilateral hip replacements. Axis IV:   Severe, related to psychosocial problems and alcohol dependence and            medical problems. Axis V:    GAF 20, highest past year 60.  DISCHARGE DIAGNOSES: Axis I:    1. Alcohol dependence.            2. Delirium tremens. Axis II:   Deferred. Axis III:  1. Asthma.            2. Hypertension.            3. History of left ankle fracture one week ago.            4. History of bilateral hip replacements. Axis IV:   Severe, related to psychosocial and alcohol dependence. Axis V:    Global assessment of functioning on admission 20, 20 on            discharge.  BRIEF HISTORY:  The patient is a 77 year old married Caucasian male who is admitted for detox on a voluntary basis.  The family brought the patient to the emergency room at [facility], stating they had been trying to have him detoxified from alcohol for the past few weeks.  The patient has been moderately tremulous and the family had been supplying him with up to two pints a day of vodka for three weeks.  His last drink was on the day of his admission.  Additionally, the patient fell a week ago while drinking and secured a fracture of the left ankle and did not even know it was fractured because of his inebriated state.  The fracture was splinted in the emergency room on the day of his admission.  PERTINENT PHYSICAL AND LABORATORY DATA:  Physical examination on admission showed marked tremor and history of hypertension and alcohol-related seizures. His speech  was slurred and he had a posterior splint supporting the fracture in place.  We took an x-ray of his left ankle which showed a distal left fibular fracture.  X-ray of the left foot showed a non-displaced fracture of the distal left fibular ______diaphyseal region.  Urine drug screen was unremarkable.  Blood chemistry showed hypokalemia with potassium of 3.1, decreased BUN at 3, glucose mildly elevated at 114, LDH was low at 7.6, PCO2 was 17.7, bicarbonate 19, hematocrit was 37.  Alcohol level on admission 368.  The patient also showed pancytopenia.  HOSPITAL COURSE:  Given the magnitude of his physical distress and his pancytopenia, the patient was transferred to [facility] ER.  He was evaluated at [facility] ER and admitted at the med-psych ward at that hospital for medical detoxification and management of his multiple  physical problems. DD:  02/08/00 TD:  02/08/00 Job: [id number]

## 2010-11-06 NOTE — Discharge Summary (Signed)
NAMEEVART, MCDONNELL              ACCOUNT NO.:  [id number]   MEDICAL RECORD NO.:  [id number]          PATIENT TYPE:  INP   LOCATION:  3730                         FACILITY:  [facility]   PHYSICIAN:  Hettie Holstein, D.O.    DATE OF BIRTH:  Sep 20, 1934   DATE OF ADMISSION:  06/28/2007  DATE OF DISCHARGE:  07/04/2007                               DISCHARGE SUMMARY   PRIMARY CARE PHYSICIAN:  Dr. Yetta Flock in [place].   FINAL DIAGNOSIS:  Isolated seizure event and alteration in mental  status.   SECONDARY DIAGNOSES:  1. Hyponatremia.  His urine osmolality was at 120 and this was felt to      possibly be related to the potomania.  2. Alcoholism with withdrawal; a questionable seizure without      reoccurrence, perhaps withdrawal associated.  3. Cerebrovascular disease with a chronic infarct on the right frontal      lobe per magnetic resonance imaging.  4. Right rotator cuff tear.   CONSULTATIONS THIS ADMISSION:  Marlan Palau, MD, of Neurology.   MEDICATIONS ON DISCHARGE:  1. Albuterol q.i.d. p.r.n.  2. Flovent inhaled daily as before.  3. Prilosec 20 mg daily as before.   DISPOSITION:  He was instructed not to drive until seizure-free x6  months.  He was instructed to follow with his primary care physician,  Dr. Yetta Flock, in [place].  He was instructed to follow up with Dr. Cornelius Moras.  He was instructed to call at [phone].   LABORATORY DATA:  The patient's sodium at time of discharge was 135,  potassium 4.1, BUN 8, creatinine 0.94.  This was improved from his  presenting numbers where his sodium was quite low at 114.  This improved  with fluid restriction.   MRI as noted above revealed no evidence of acute infarct.  He was seen  by Dr. Pearlean Brownie, who felt that his seizure was from the hyponatremia.  He  underwent a two-D echocardiogram, which revealed an ejection fraction of  60 to 65%, mild increase in LV wall thickness.  He also underwent a  Cardiology consultation for bradycardia, a  Mobitz Type I, blood pressure  was discontinued, and these resolved without reoccurrence.  He was felt  to be medically stable for  discharge home as well as to address his rotator cuff tear with his  primary orthopedic surgeon, and follow up with his primary care  physician ultimately for medical clearance and release to drive after  confirmation that he has been seizure-free for six months.      Hettie Holstein, D.O.  Electronically Signed     ESS/MEDQ  D:  08/23/2007  T:  08/23/2007  Job:  [id number]   cc:   Dr. Yetta Flock in [place] (which one??)

## 2010-11-06 NOTE — Op Note (Signed)
NAME:  MAISON, KESTENBAUM                        ACCOUNT NO.:  [id number]   MEDICAL RECORD NO.:  [id number]                   PATIENT TYPE:  EMS   LOCATION:  ED                                   FACILITY:  [facility]   PHYSICIAN:  Ollen Gross, M.D.                 DATE OF BIRTH:  28-May-1935   DATE OF PROCEDURE:  09/22/2003  DATE OF DISCHARGE:                                 OPERATIVE REPORT   PREOPERATIVE DIAGNOSES:  Right hip dislocation.   POSTOPERATIVE DIAGNOSES:  Right hip dislocation.   PROCEDURE:  Closed reduction right total hip arthroplasty dislocation.   SURGEON:  Ollen Gross, M.D.   ANESTHESIA:  General.   COMPLICATIONS:  None.   CONDITION:  Stable to recovery.   BRIEF CLINICAL NOTE:  Mr. Hermann is a 76 year old male who according to his  wife was drinking heavily this weekend and fell off the couch earlier this  morning with immediate right hip pain and deformity. He was rushed to our  emergency room by [facility] and noted to have a right total hip  arthroplasty dislocation. Dr. Debria Garret had done his primary hip  replacement in 1988.  Mr. Griffie sustained a similar dislocation in December  of 2004 treated with closed reduction. The radiographs show a posterior  superior dislocation. He presents now for closed reduction. Of note, his  alcohol level was 306 upon arrival to the emergency room.   DESCRIPTION OF PROCEDURE:  After successful administration of general  anesthetic, the reduction maneuvers performed with the hip in the 90/90  position and traction applied.  I was initially unable to reduce and close  on the structure and thus we moved him over to the hospital bed to use  fluoroscopic guidance. With further muscle relaxing, I was subsequently then  able to get him close reduced and was confirmed to be a concentric reduction  with C-arm fluoroscopy.  His range of motion was stable with flexion to 90,  rotation about 30 in each direction and then  full extension and abduction to  about 40.  Leg lengths were equalized after the reduction. I then placed him  into a knee immobilizer and he was awakened and transported to recovery in  stable condition.                                               Ollen Gross, M.D.    FA/MEDQ  D:  09/22/2003  T:  09/22/2003  Job:  [id number]

## 2010-11-06 NOTE — Discharge Summary (Signed)
[facility]  Patient:    [name], [name]                     MRN: [id number] Adm. Date:  [id number] Disc. Date: [id number] Attending:  Miguel Aschoff CC:         R. Valma Cava, M.D.   Discharge Summary  DATE OF BIRTH:  03-03-1935  CONSULTATIONS:  Dr. Valma Cava.  PROCEDURES:  Placement of fiberglass short-leg cast left leg.  DISCHARGE DIAGNOSES:  1. Alcohol intoxication with withdrawal.  2. Chronic alcoholism.  3. 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  4. Alcohol hepatitis.  5. History of gastroesophageal reflux disease, status post hiatal hernia     repair.  6. History of hypertension.  7. History of bilateral total hip replacement.  8. History of hernia repair.  9. Nondisplaced distal left fibular fracture. 10. Hypokalemia. 11. History of chronic obstructive pulmonary disease. 12. History of diet-controlled type 2 diabetes mellitus. 13. Cholelithiasis by ultrasound.  DISCHARGE MEDICATIONS:  1. Zestril 10 mg p.o. q.d.  2. Prevacid 15 mg p.o. q.d.  3. Aspirin 325 mg p.o. q.d.  4. Flovent 220 mcg two puffs b.i.d.  5. Albuterol inhaler two puffs q.4h. p.r.n.  6. Darvocet-N 100 one to two q.4-6h. p.r.n. leg pain.  DISCHARGE FOLLOWUP:  Dr. Thomasena Edis should evaluate the patient during last week of July or first week of August for repeat x-ray and fracture re-evaluation. He is to schedule an appointment with his regular physician in two to four weeks for repeat blood work.  He is referred to [facility] for alcohol rehab.  SUMMARY OF HISTORY:  The patient is a 76 year old chronic alcoholic who had been on a binge since before July 4 with minimal food intake resulting in marked weakness and severe gait disorder.  He was taken to the [facility] Emergency Room for help where he was assessed to have alcohol intoxication and a nondisplaced left fibular fracture, thereafter sent to [facility] for detox. Abnormal  results on lab work prompted transfer back to [facility] Emergency Room for evaluation where alcohol marrow suppression was surmised and no indication for admission was noted.  Dr. Jodi Marble did not feel he could manage the patient at [facility], however, and I was asked to admit the patient on unassigned call as he was at that time very lethargic secondary to Librium and sleep deprivation.  For details of Admission History and Physical, please see H & P.  HOSPITAL COURSE: #1 - ALCOHOL INTOXICATION AND WITHDRAWAL:  Mr. Turpin was noted to be quite tremulous.  He was placed on IV fluids, Librium taper, thiamine, and vitamins. He did sustain a fairly prolonged withdrawal period with marked delirium, but did not have seizures.  He gradually awakened and was increasingly able to tolerate meals, although with a chronically depressed appetite secondary to his prior binge.  His family expressed concern and fear about Mr. Mclaren continued alcohol problem and dangerous personality transformations when under the influence.  Mr. Brenning awakened from his delirium to be quite remorseful, and he indicated a desire to undergo therapy.  To this end he is referred to the [facility] treatment facility.  #2 - ALCOHOL-RELATED BONE MARROW SUPPRESSION PANCYTOPENIA:  On admission during dehydrated state hemoglobin noted low at 11, platelets 67, WBC 1.4. Within two days hemoglobin had drifted to 10.8 probably secondary to rehydration, and white blood count had risen to 2.4 with platelets rising to 115.  This is consistent with alcohol-related marrow suppression, and is expected to take many days to weeks before recovery to normal levels.  The anemia is likely to be multifactorial in an alcoholic secondary to direct effect of alcohol on marrow as well as potential gastritis-related GI blood losses.  Right upper quadrant and left upper quadrant ultrasounds were obtained to rule out  splenomegaly.  They revealed normal-appearing liver and spleen parenchyma and size with incidental finding of cholelithiasis.  #3 - NONDISPLACED DISTAL LEFT FIBULAR FRACTURE:  Mr. Arvelo probably sustained his injury several days prior to admission, as he had been complaining of a sore ankle during attempts to ambulate at home.  This information was not apparent on admission, although swollen ankle was noted on exam and pain reported when he was able to cooperate with exam.  Ortho consultation was provided, and short-leg walking cast was applied.  DISPOSITION:  Mr. Dains improved to the point where he was ambulatory, although weak.  He is advised to continue walking with a Batt.  DISCHARGE LABORATORIES:  Sodium 141, potassium 3.6, glucose 85, BUN 6, creatinine 0.7, albumin 3, AST 60, ALT 44, ALP 86, total bilirubin 1.  Iron 64, total iron-binding capacity 192, percent saturation 33, B12 659, RBC folate 410, ferritin 500.  Urinalysis negative.  CBC reported above. DD:  03/22/00 TD:  03/22/00 Job: [id number]

## 2010-11-06 NOTE — Discharge Summary (Signed)
[name]. [facility]  Patient:    [name], [name]                     MRN: [id number] Adm. Date:  [id number] Disc. Date: [id number] Attending:  Miguel Ruiz CC:         [name], M.D., [name], [place]   Discharge Summary  CONSULTS:  None.  PROCEDURES:  None.  DISCHARGE DIAGNOSES: 1. Chronic alcoholism with alcohol withdrawal, recurrent. 2. Pancytopenia secondary to chronic alcoholism. 3. Hypokalemia, hypomagnesemia, secondary to chronic alcoholism. 4. History of asthma. 5. History of hypertension. 6. History of gastroesophageal reflux disease, status post hiatal hernia    repair. 7. History of abdominal herniorrhaphy. 8. History of bilateral hip osteoarthritis requiring bilateral hip    replacements.  DISCHARGE MEDICATIONS: 1. Flovent and albuterol to resume his previous dose (he does not recall    dosing regimen). 2. Magnesium oxide 400 mg p.o. b.i.d. x 2 weeks. 3. ______ 25 mg p.o. q.d. to complete course. 4. Multivitamin 1 p.o. q.d.  DISCHARGE FOLLOW-UP:  The patient is to make an appointment with his primary care doctor to be seen within two weeks.  He is to contact [facility] for substance abuse counseling and for treatment resources. Home health physical therapy has been arranged.  SUMMARY:  The patient is a 76 year old man with a significant history of chronic alcoholism, seen multiple times in the emergency room and requiring admission at least twice during the preceding year.  On the night of admission, he was found at home by his son shaking uncontrollably and requesting to see a physician.  His family reported that he had been on a binge for the preceding 4-5 weeks, to the exclusion of proper nutrition or hydration.  He was admitted for further evaluation and care.  For further details of the H&P, please see dictated report by Dr. Jetty Ruiz, the admitting physician on unassigned call.  HOSPITAL  COURSE: #1 - CHRONIC ALCOHOLISM WITH ACUTE WITHDRAWAL:  [name] was in withdrawal upon admission, which was treated with scheduled Librium and p.r.n. Ativan. With time, his symptoms diminished, although he had some persistence in delirium at the time of discharge home.  He was able to ambulate with a [name] under the care of physical therapy at the time of discharge, and recommendation is for continued physical therapy, but if it is in the home to concentrate on gait and strengthening.  [name] again was confronted with his alcoholism problem, which is of grave concern to his family.  All alcohol had been cleared from the home prior to his return home.  During a recent hospitalization at [facility], we had requested the patient seek alcohol rehab counseling at [facility].  He states that he felt too weak to attend, and soon after his return home, resumed drinking and never established contact.  Again, he desires assistance and seems motivated, but I am not confident he will follow through.  #2 - ELECTROLYTE ABNORMALITY SECONDARY TO CHRONIC ALCOHOLISM:  Potassium was noted to be low at 3.1 and magnesium 0.7, both of which were repleted this hospitalization.  He will continue to require some magnesium repletion upon discharge.  #3 - PANCYTOPENIA:  This has been noted on prior admissions, with historic improvement following sedation in his case.  Ultrasound was done at [facility] within the past six months.  At discharge, hemoglobin is 10.5, white count 2.8, platelets 72, MCV 87.3.  Serum evaluation  was not done this admission.  #4 - ASTHMA:  The patient states he has had asthma since childhood.  During his withdrawal, he experienced significant side effects from beta agonist therapy.  He will need to continue his MDIs upon discharge. DD:  05/05/00 TD:  05/05/00 Job: [id number]

## 2010-11-06 NOTE — Op Note (Signed)
NAME:  [name], [name]                        ACCOUNT NO.:  [id number]   MEDICAL RECORD NO.:  [id number]                   PATIENT TYPE:  INP   LOCATION:  0102                                 FACILITY:  [facility]   PHYSICIAN:  Vania Rea. Supple, M.D.               DATE OF BIRTH:  [birth year]   DATE OF PROCEDURE:  06/21/2003  DATE OF DISCHARGE:                                 OPERATIVE REPORT   PREOPERATIVE DIAGNOSIS:  Dislocated right total hip arthroplasty.   POSTOPERATIVE DIAGNOSIS:  Dislocated right total hip arthroplasty.   PROCEDURE:  Closed reduction of dislocated right total hip arthroplasty.   SURGEON OF RECORD:  Vania Rea. Supple, M.D.   ANESTHESIA:  General endotracheal.   COMPLICATIONS:  None.   HISTORY:  [name] is a 76 year old gentleman who has had a right total  hip arthroplasty by Ronnell Guadalajara, M.D., a number of years ago and he has  done well until last night, at which time he apparently fell while at home  and was noted to have increased right hip pain, loss of motion, and apparent  leg length discrepancy.  He apparently was placed on the couch to sleep last  night and when he awoke at 4 this morning had continued complaints of right  hip deformity and was brought to the emergency room, where x-rays confirmed  the posterior dislocation of the right total hip implant.  This is his first  dislocation.  The patient was subsequently brought to the operating room at  this time for planned closed reduction.   Preoperatively [name] was counseled on treatment options as well as  risks versus benefits thereof.  Possible complications of failure of  reduction, fracture, or recurrent dislocation were reviewed.  He understands  and accepts and agrees with the planned procedure.   PROCEDURE IN DETAIL:  After undergoing routine preoperative clearance, the  patient is brought to the operating room and on the emergency room gurney he  went smooth induction of general  endotracheal anesthesia.  A reduction  maneuver was then performed with flexion of the hip and internal rotation of  the thigh and longitudinal traction.  Several attempts were required and  ultimately the hip was successfully reduced with fluoroscopic imaging used  to confirm concentric reduction of the hip and a stable range of motion.  The patient was then placed into a knee immobilizer, extubated, and taken to  the recovery room in stable condition.                                               Vania Rea. Rennis Chris, M.D.    KMS/MEDQ  D:  06/21/2003  T:  06/21/2003  Job:  [id number]

## 2011-03-10 LAB — BASIC METABOLIC PANEL
BUN: 5 — ABNORMAL LOW
BUN: 6
BUN: 8
BUN: 8
CO2: 24
CO2: 27
Calcium: 8.1 — ABNORMAL LOW
Calcium: 8.8
Chloride: 100
Chloride: 101
Chloride: 102
Creatinine, Ser: 0.94
Creatinine, Ser: 0.96
Creatinine, Ser: 0.97
Creatinine, Ser: 0.99
GFR calc Af Amer: >60
GFR calc non Af Amer: >60
GFR calc non Af Amer: >60
Glucose, Bld: 106 — ABNORMAL HIGH

## 2011-03-10 LAB — OSMOLALITY, URINE: Osmolality, Ur: 120 — ABNORMAL LOW

## 2011-03-10 LAB — COMPREHENSIVE METABOLIC PANEL
Albumin: 3.5
Alkaline Phosphatase: 102
BUN: 6
Calcium: 7.6 — ABNORMAL LOW
Creatinine, Ser: 0.85
Glucose, Bld: 147 — ABNORMAL HIGH
Potassium: 4.4
Total Protein: 6.7

## 2011-03-10 LAB — DIFFERENTIAL
Basophils Relative: 0
Lymphocytes Relative: 4 — ABNORMAL LOW
Lymphs Abs: 0.5 — ABNORMAL LOW
Monocytes Absolute: 1.5 — ABNORMAL HIGH
Monocytes Relative: 12
Neutro Abs: 10.3 — ABNORMAL HIGH
Neutrophils Relative %: 84 — ABNORMAL HIGH

## 2011-03-10 LAB — URINE MICROSCOPIC-ADD ON

## 2011-03-10 LAB — I-STAT 8, (EC8 V) (CONVERTED LAB)
Chloride: 84 — ABNORMAL LOW
Glucose, Bld: 149 — ABNORMAL HIGH
Hemoglobin: 15.6
Potassium: 4.7
Sodium: 114 — CL
TCO2: 22
pH, Ven: 7.345 — ABNORMAL HIGH

## 2011-03-10 LAB — HEMOGLOBIN A1C
Hgb A1c MFr Bld: 5.6
Mean Plasma Glucose: 122

## 2011-03-10 LAB — URIC ACID: Uric Acid, Serum: 6.7

## 2011-03-10 LAB — MYOGLOBIN, SERUM: Myoglobin: 228 — ABNORMAL HIGH

## 2011-03-10 LAB — URINALYSIS, ROUTINE W REFLEX MICROSCOPIC
Bilirubin Urine: NEGATIVE
Glucose, UA: NEGATIVE
Specific Gravity, Urine: 1.015

## 2011-03-10 LAB — CBC
HCT: 40.4
Hemoglobin: 14.1
MCHC: 34.8
MCV: 86.3
Platelets: 235
RDW: 12.4

## 2011-03-10 LAB — LIPID PANEL
HDL: 38 — ABNORMAL LOW
LDL Cholesterol: 132 — ABNORMAL HIGH
Triglycerides: 122

## 2011-03-10 LAB — CARDIAC PANEL(CRET KIN+CKTOT+MB+TROPI): Relative Index: 0.3

## 2011-03-10 LAB — OSMOLALITY: Osmolality: 241 — ABNORMAL LOW

## 2011-03-10 LAB — MAGNESIUM: Magnesium: 1.8

## 2011-03-10 LAB — VITAMIN B12: Vitamin B-12: >2000 — ABNORMAL HIGH (ref 211–911)

## 2011-03-10 LAB — PROTIME-INR
INR: 1
Prothrombin Time: 13.7

## 2011-03-10 LAB — RPR: RPR Ser Ql: NONREACTIVE

## 2011-03-10 LAB — APTT: aPTT: 33

## 2011-03-10 LAB — POCT CARDIAC MARKERS: Myoglobin, poc: >500

## 2015-04-01 DIAGNOSIS — Z45018 Encounter for adjustment and management of other part of cardiac pacemaker: Secondary | ICD-10-CM | POA: Insufficient documentation

## 2015-04-01 DIAGNOSIS — I480 Paroxysmal atrial fibrillation: Secondary | ICD-10-CM

## 2015-04-01 DIAGNOSIS — I442 Atrioventricular block, complete: Secondary | ICD-10-CM

## 2015-04-01 DIAGNOSIS — Z0000 Encounter for general adult medical examination without abnormal findings: Secondary | ICD-10-CM

## 2015-04-01 DIAGNOSIS — E039 Hypothyroidism, unspecified: Secondary | ICD-10-CM | POA: Insufficient documentation

## 2015-04-01 HISTORY — DX: Paroxysmal atrial fibrillation: I48.0

## 2015-04-01 HISTORY — DX: Atrioventricular block, complete: I44.2

## 2015-04-01 HISTORY — DX: Encounter for general adult medical examination without abnormal findings: Z00.00

## 2015-04-23 DIAGNOSIS — R072 Precordial pain: Secondary | ICD-10-CM | POA: Insufficient documentation

## 2015-04-23 DIAGNOSIS — I739 Peripheral vascular disease, unspecified: Secondary | ICD-10-CM | POA: Insufficient documentation

## 2015-04-23 HISTORY — DX: Precordial pain: R07.2

## 2015-07-16 DIAGNOSIS — I498 Other specified cardiac arrhythmias: Secondary | ICD-10-CM | POA: Diagnosis not present

## 2015-07-16 DIAGNOSIS — Z45018 Encounter for adjustment and management of other part of cardiac pacemaker: Secondary | ICD-10-CM | POA: Diagnosis not present

## 2015-07-31 DIAGNOSIS — M47816 Spondylosis without myelopathy or radiculopathy, lumbar region: Secondary | ICD-10-CM | POA: Diagnosis not present

## 2015-07-31 DIAGNOSIS — E039 Hypothyroidism, unspecified: Secondary | ICD-10-CM | POA: Diagnosis not present

## 2015-07-31 DIAGNOSIS — I739 Peripheral vascular disease, unspecified: Secondary | ICD-10-CM | POA: Diagnosis not present

## 2015-07-31 DIAGNOSIS — Z6827 Body mass index (BMI) 27.0-27.9, adult: Secondary | ICD-10-CM | POA: Diagnosis not present

## 2015-07-31 DIAGNOSIS — I2510 Atherosclerotic heart disease of native coronary artery without angina pectoris: Secondary | ICD-10-CM | POA: Diagnosis not present

## 2015-07-31 DIAGNOSIS — I359 Nonrheumatic aortic valve disorder, unspecified: Secondary | ICD-10-CM | POA: Diagnosis not present

## 2015-07-31 DIAGNOSIS — E785 Hyperlipidemia, unspecified: Secondary | ICD-10-CM | POA: Diagnosis not present

## 2015-09-01 DIAGNOSIS — Z6827 Body mass index (BMI) 27.0-27.9, adult: Secondary | ICD-10-CM | POA: Diagnosis not present

## 2015-09-01 DIAGNOSIS — J441 Chronic obstructive pulmonary disease with (acute) exacerbation: Secondary | ICD-10-CM | POA: Diagnosis not present

## 2015-09-09 DIAGNOSIS — I209 Angina pectoris, unspecified: Secondary | ICD-10-CM | POA: Diagnosis not present

## 2015-09-09 DIAGNOSIS — I739 Peripheral vascular disease, unspecified: Secondary | ICD-10-CM | POA: Diagnosis not present

## 2015-09-09 DIAGNOSIS — I442 Atrioventricular block, complete: Secondary | ICD-10-CM | POA: Diagnosis not present

## 2015-09-09 DIAGNOSIS — I480 Paroxysmal atrial fibrillation: Secondary | ICD-10-CM | POA: Diagnosis not present

## 2015-10-09 DIAGNOSIS — I442 Atrioventricular block, complete: Secondary | ICD-10-CM | POA: Diagnosis not present

## 2015-10-09 DIAGNOSIS — Z950 Presence of cardiac pacemaker: Secondary | ICD-10-CM | POA: Diagnosis not present

## 2015-10-09 DIAGNOSIS — I209 Angina pectoris, unspecified: Secondary | ICD-10-CM | POA: Diagnosis not present

## 2015-10-09 DIAGNOSIS — I739 Peripheral vascular disease, unspecified: Secondary | ICD-10-CM | POA: Diagnosis not present

## 2015-10-09 DIAGNOSIS — I480 Paroxysmal atrial fibrillation: Secondary | ICD-10-CM | POA: Diagnosis not present

## 2015-10-16 DIAGNOSIS — I209 Angina pectoris, unspecified: Secondary | ICD-10-CM | POA: Diagnosis not present

## 2015-10-21 DIAGNOSIS — Z950 Presence of cardiac pacemaker: Secondary | ICD-10-CM | POA: Diagnosis not present

## 2015-12-03 DIAGNOSIS — E785 Hyperlipidemia, unspecified: Secondary | ICD-10-CM | POA: Diagnosis not present

## 2015-12-03 DIAGNOSIS — M47816 Spondylosis without myelopathy or radiculopathy, lumbar region: Secondary | ICD-10-CM | POA: Diagnosis not present

## 2015-12-03 DIAGNOSIS — I739 Peripheral vascular disease, unspecified: Secondary | ICD-10-CM | POA: Diagnosis not present

## 2015-12-03 DIAGNOSIS — Z6827 Body mass index (BMI) 27.0-27.9, adult: Secondary | ICD-10-CM | POA: Diagnosis not present

## 2015-12-03 DIAGNOSIS — I2510 Atherosclerotic heart disease of native coronary artery without angina pectoris: Secondary | ICD-10-CM | POA: Diagnosis not present

## 2015-12-03 DIAGNOSIS — I359 Nonrheumatic aortic valve disorder, unspecified: Secondary | ICD-10-CM | POA: Diagnosis not present

## 2015-12-03 DIAGNOSIS — E039 Hypothyroidism, unspecified: Secondary | ICD-10-CM | POA: Diagnosis not present

## 2015-12-03 DIAGNOSIS — J449 Chronic obstructive pulmonary disease, unspecified: Secondary | ICD-10-CM | POA: Diagnosis not present

## 2015-12-04 DIAGNOSIS — Z950 Presence of cardiac pacemaker: Secondary | ICD-10-CM | POA: Diagnosis not present

## 2015-12-04 DIAGNOSIS — I739 Peripheral vascular disease, unspecified: Secondary | ICD-10-CM | POA: Diagnosis not present

## 2015-12-04 DIAGNOSIS — I442 Atrioventricular block, complete: Secondary | ICD-10-CM | POA: Diagnosis not present

## 2015-12-04 DIAGNOSIS — I209 Angina pectoris, unspecified: Secondary | ICD-10-CM | POA: Diagnosis not present

## 2015-12-04 DIAGNOSIS — R072 Precordial pain: Secondary | ICD-10-CM | POA: Diagnosis not present

## 2015-12-04 DIAGNOSIS — I480 Paroxysmal atrial fibrillation: Secondary | ICD-10-CM | POA: Diagnosis not present

## 2016-02-03 DIAGNOSIS — Z0000 Encounter for general adult medical examination without abnormal findings: Secondary | ICD-10-CM | POA: Diagnosis not present

## 2016-02-12 DIAGNOSIS — I442 Atrioventricular block, complete: Secondary | ICD-10-CM | POA: Diagnosis not present

## 2016-02-12 DIAGNOSIS — I480 Paroxysmal atrial fibrillation: Secondary | ICD-10-CM | POA: Diagnosis not present

## 2016-02-12 DIAGNOSIS — I739 Peripheral vascular disease, unspecified: Secondary | ICD-10-CM | POA: Diagnosis not present

## 2016-02-12 DIAGNOSIS — Z950 Presence of cardiac pacemaker: Secondary | ICD-10-CM | POA: Diagnosis not present

## 2016-03-15 DIAGNOSIS — I739 Peripheral vascular disease, unspecified: Secondary | ICD-10-CM | POA: Diagnosis not present

## 2016-03-15 DIAGNOSIS — I209 Angina pectoris, unspecified: Secondary | ICD-10-CM | POA: Diagnosis not present

## 2016-03-15 DIAGNOSIS — I480 Paroxysmal atrial fibrillation: Secondary | ICD-10-CM | POA: Diagnosis not present

## 2016-03-17 DIAGNOSIS — Z1211 Encounter for screening for malignant neoplasm of colon: Secondary | ICD-10-CM | POA: Diagnosis not present

## 2016-04-08 DIAGNOSIS — E785 Hyperlipidemia, unspecified: Secondary | ICD-10-CM | POA: Diagnosis not present

## 2016-04-08 DIAGNOSIS — I2510 Atherosclerotic heart disease of native coronary artery without angina pectoris: Secondary | ICD-10-CM | POA: Diagnosis not present

## 2016-04-08 DIAGNOSIS — E039 Hypothyroidism, unspecified: Secondary | ICD-10-CM | POA: Diagnosis not present

## 2016-04-08 DIAGNOSIS — J449 Chronic obstructive pulmonary disease, unspecified: Secondary | ICD-10-CM | POA: Diagnosis not present

## 2016-04-08 DIAGNOSIS — I359 Nonrheumatic aortic valve disorder, unspecified: Secondary | ICD-10-CM | POA: Diagnosis not present

## 2016-04-08 DIAGNOSIS — I739 Peripheral vascular disease, unspecified: Secondary | ICD-10-CM | POA: Diagnosis not present

## 2016-04-15 DIAGNOSIS — R972 Elevated prostate specific antigen [PSA]: Secondary | ICD-10-CM | POA: Diagnosis not present

## 2016-04-15 DIAGNOSIS — N3000 Acute cystitis without hematuria: Secondary | ICD-10-CM | POA: Diagnosis not present

## 2016-04-20 DIAGNOSIS — N4289 Other specified disorders of prostate: Secondary | ICD-10-CM | POA: Diagnosis not present

## 2016-04-20 DIAGNOSIS — R972 Elevated prostate specific antigen [PSA]: Secondary | ICD-10-CM | POA: Diagnosis not present

## 2016-04-20 DIAGNOSIS — N4232 Atypical small acinar proliferation of prostate: Secondary | ICD-10-CM | POA: Diagnosis not present

## 2016-04-20 DIAGNOSIS — C61 Malignant neoplasm of prostate: Secondary | ICD-10-CM | POA: Diagnosis not present

## 2016-04-27 DIAGNOSIS — C61 Malignant neoplasm of prostate: Secondary | ICD-10-CM | POA: Diagnosis not present

## 2016-05-14 DIAGNOSIS — Z950 Presence of cardiac pacemaker: Secondary | ICD-10-CM | POA: Diagnosis not present

## 2016-07-02 DIAGNOSIS — I209 Angina pectoris, unspecified: Secondary | ICD-10-CM | POA: Diagnosis not present

## 2016-07-02 DIAGNOSIS — I480 Paroxysmal atrial fibrillation: Secondary | ICD-10-CM | POA: Diagnosis not present

## 2016-07-02 DIAGNOSIS — I442 Atrioventricular block, complete: Secondary | ICD-10-CM | POA: Diagnosis not present

## 2016-07-02 DIAGNOSIS — Z45018 Encounter for adjustment and management of other part of cardiac pacemaker: Secondary | ICD-10-CM | POA: Diagnosis not present

## 2016-07-02 DIAGNOSIS — I739 Peripheral vascular disease, unspecified: Secondary | ICD-10-CM | POA: Diagnosis not present

## 2016-08-13 DIAGNOSIS — Z950 Presence of cardiac pacemaker: Secondary | ICD-10-CM | POA: Diagnosis not present

## 2016-08-17 DIAGNOSIS — C61 Malignant neoplasm of prostate: Secondary | ICD-10-CM | POA: Diagnosis not present

## 2016-08-17 DIAGNOSIS — E782 Mixed hyperlipidemia: Secondary | ICD-10-CM | POA: Diagnosis not present

## 2016-08-17 DIAGNOSIS — S134XXA Sprain of ligaments of cervical spine, initial encounter: Secondary | ICD-10-CM | POA: Diagnosis not present

## 2016-08-17 DIAGNOSIS — E038 Other specified hypothyroidism: Secondary | ICD-10-CM | POA: Diagnosis not present

## 2016-08-17 DIAGNOSIS — I25110 Atherosclerotic heart disease of native coronary artery with unstable angina pectoris: Secondary | ICD-10-CM | POA: Diagnosis not present

## 2016-08-17 DIAGNOSIS — E039 Hypothyroidism, unspecified: Secondary | ICD-10-CM | POA: Diagnosis not present

## 2016-08-25 DIAGNOSIS — N3020 Other chronic cystitis without hematuria: Secondary | ICD-10-CM | POA: Diagnosis not present

## 2016-08-25 DIAGNOSIS — R972 Elevated prostate specific antigen [PSA]: Secondary | ICD-10-CM | POA: Diagnosis not present

## 2016-08-25 DIAGNOSIS — N318 Other neuromuscular dysfunction of bladder: Secondary | ICD-10-CM | POA: Diagnosis not present

## 2016-08-25 DIAGNOSIS — C61 Malignant neoplasm of prostate: Secondary | ICD-10-CM | POA: Diagnosis not present

## 2016-11-12 DIAGNOSIS — Z950 Presence of cardiac pacemaker: Secondary | ICD-10-CM | POA: Diagnosis not present

## 2016-12-14 DIAGNOSIS — C61 Malignant neoplasm of prostate: Secondary | ICD-10-CM | POA: Diagnosis not present

## 2016-12-14 DIAGNOSIS — J449 Chronic obstructive pulmonary disease, unspecified: Secondary | ICD-10-CM | POA: Diagnosis not present

## 2016-12-14 DIAGNOSIS — E782 Mixed hyperlipidemia: Secondary | ICD-10-CM | POA: Diagnosis not present

## 2016-12-14 DIAGNOSIS — I359 Nonrheumatic aortic valve disorder, unspecified: Secondary | ICD-10-CM | POA: Diagnosis not present

## 2016-12-14 DIAGNOSIS — I10 Essential (primary) hypertension: Secondary | ICD-10-CM | POA: Diagnosis not present

## 2016-12-14 DIAGNOSIS — I2510 Atherosclerotic heart disease of native coronary artery without angina pectoris: Secondary | ICD-10-CM | POA: Diagnosis not present

## 2016-12-14 DIAGNOSIS — E038 Other specified hypothyroidism: Secondary | ICD-10-CM | POA: Diagnosis not present

## 2016-12-29 DIAGNOSIS — E785 Hyperlipidemia, unspecified: Secondary | ICD-10-CM | POA: Insufficient documentation

## 2016-12-29 DIAGNOSIS — I442 Atrioventricular block, complete: Secondary | ICD-10-CM | POA: Diagnosis not present

## 2016-12-29 DIAGNOSIS — I739 Peripheral vascular disease, unspecified: Secondary | ICD-10-CM

## 2016-12-29 DIAGNOSIS — Z45018 Encounter for adjustment and management of other part of cardiac pacemaker: Secondary | ICD-10-CM | POA: Diagnosis not present

## 2016-12-29 DIAGNOSIS — I209 Angina pectoris, unspecified: Secondary | ICD-10-CM | POA: Diagnosis not present

## 2016-12-29 DIAGNOSIS — I779 Disorder of arteries and arterioles, unspecified: Secondary | ICD-10-CM | POA: Insufficient documentation

## 2016-12-29 DIAGNOSIS — I480 Paroxysmal atrial fibrillation: Secondary | ICD-10-CM | POA: Diagnosis not present

## 2016-12-29 HISTORY — DX: Disorder of arteries and arterioles, unspecified: I77.9

## 2016-12-29 HISTORY — DX: Hyperlipidemia, unspecified: E78.5

## 2017-02-07 ENCOUNTER — Inpatient Hospital Stay: Admission: AD | Admit: 2017-02-07 | Payer: Self-pay | Source: Other Acute Inpatient Hospital | Admitting: Cardiology

## 2017-02-07 DIAGNOSIS — I252 Old myocardial infarction: Secondary | ICD-10-CM | POA: Diagnosis not present

## 2017-02-07 DIAGNOSIS — Z0000 Encounter for general adult medical examination without abnormal findings: Secondary | ICD-10-CM | POA: Diagnosis not present

## 2017-02-07 DIAGNOSIS — I25110 Atherosclerotic heart disease of native coronary artery with unstable angina pectoris: Secondary | ICD-10-CM | POA: Diagnosis not present

## 2017-02-07 DIAGNOSIS — E785 Hyperlipidemia, unspecified: Secondary | ICD-10-CM | POA: Diagnosis not present

## 2017-02-07 DIAGNOSIS — Z950 Presence of cardiac pacemaker: Secondary | ICD-10-CM | POA: Diagnosis not present

## 2017-02-07 DIAGNOSIS — R072 Precordial pain: Secondary | ICD-10-CM | POA: Diagnosis not present

## 2017-02-07 DIAGNOSIS — R110 Nausea: Secondary | ICD-10-CM | POA: Diagnosis not present

## 2017-02-07 DIAGNOSIS — R0602 Shortness of breath: Secondary | ICD-10-CM | POA: Diagnosis not present

## 2017-02-07 DIAGNOSIS — Z6831 Body mass index (BMI) 31.0-31.9, adult: Secondary | ICD-10-CM | POA: Diagnosis not present

## 2017-02-07 DIAGNOSIS — R079 Chest pain, unspecified: Secondary | ICD-10-CM | POA: Diagnosis not present

## 2017-02-07 DIAGNOSIS — I10 Essential (primary) hypertension: Secondary | ICD-10-CM | POA: Diagnosis not present

## 2017-02-08 ENCOUNTER — Inpatient Hospital Stay (HOSPITAL_COMMUNITY)
Admission: RE | Disposition: A | Discharge status: Home / Self Care | Payer: Self-pay | Source: Ambulatory Visit | Attending: Cardiology

## 2017-02-08 ENCOUNTER — Inpatient Hospital Stay (HOSPITAL_COMMUNITY): Payer: PPO

## 2017-02-08 ENCOUNTER — Inpatient Hospital Stay (HOSPITAL_COMMUNITY)
Admission: RE | Admit: 2017-02-08 | Discharge: 2017-02-09 | DRG: 287 | Disposition: A | Discharge status: Home / Self Care | Payer: PPO | Source: Ambulatory Visit | Attending: Cardiology | Admitting: Cardiology

## 2017-02-08 ENCOUNTER — Encounter (HOSPITAL_COMMUNITY): Payer: Self-pay | Admitting: Nurse Practitioner

## 2017-02-08 DIAGNOSIS — I10 Essential (primary) hypertension: Secondary | ICD-10-CM | POA: Diagnosis not present

## 2017-02-08 DIAGNOSIS — I25110 Atherosclerotic heart disease of native coronary artery with unstable angina pectoris: Principal | ICD-10-CM | POA: Diagnosis present

## 2017-02-08 DIAGNOSIS — Z8249 Family history of ischemic heart disease and other diseases of the circulatory system: Secondary | ICD-10-CM

## 2017-02-08 DIAGNOSIS — Z950 Presence of cardiac pacemaker: Secondary | ICD-10-CM | POA: Diagnosis not present

## 2017-02-08 DIAGNOSIS — E538 Deficiency of other specified B group vitamins: Secondary | ICD-10-CM | POA: Diagnosis not present

## 2017-02-08 DIAGNOSIS — I2510 Atherosclerotic heart disease of native coronary artery without angina pectoris: Secondary | ICD-10-CM

## 2017-02-08 DIAGNOSIS — I6529 Occlusion and stenosis of unspecified carotid artery: Secondary | ICD-10-CM | POA: Diagnosis not present

## 2017-02-08 DIAGNOSIS — J45909 Unspecified asthma, uncomplicated: Secondary | ICD-10-CM | POA: Diagnosis present

## 2017-02-08 DIAGNOSIS — I208 Other forms of angina pectoris: Secondary | ICD-10-CM | POA: Diagnosis present

## 2017-02-08 DIAGNOSIS — R079 Chest pain, unspecified: Secondary | ICD-10-CM | POA: Diagnosis not present

## 2017-02-08 DIAGNOSIS — K219 Gastro-esophageal reflux disease without esophagitis: Secondary | ICD-10-CM | POA: Diagnosis present

## 2017-02-08 DIAGNOSIS — I200 Unstable angina: Secondary | ICD-10-CM | POA: Diagnosis not present

## 2017-02-08 DIAGNOSIS — I2089 Other forms of angina pectoris: Secondary | ICD-10-CM | POA: Diagnosis present

## 2017-02-08 DIAGNOSIS — E039 Hypothyroidism, unspecified: Secondary | ICD-10-CM | POA: Diagnosis not present

## 2017-02-08 DIAGNOSIS — I447 Left bundle-branch block, unspecified: Secondary | ICD-10-CM | POA: Diagnosis not present

## 2017-02-08 DIAGNOSIS — I441 Atrioventricular block, second degree: Secondary | ICD-10-CM | POA: Diagnosis not present

## 2017-02-08 DIAGNOSIS — E785 Hyperlipidemia, unspecified: Secondary | ICD-10-CM | POA: Diagnosis present

## 2017-02-08 DIAGNOSIS — I480 Paroxysmal atrial fibrillation: Secondary | ICD-10-CM | POA: Diagnosis present

## 2017-02-08 DIAGNOSIS — Z7982 Long term (current) use of aspirin: Secondary | ICD-10-CM

## 2017-02-08 HISTORY — PX: LEFT HEART CATH AND CORONARY ANGIOGRAPHY: CATH118249

## 2017-02-08 HISTORY — DX: Other forms of angina pectoris: I20.8

## 2017-02-08 HISTORY — DX: Other forms of angina pectoris: I20.89

## 2017-02-08 HISTORY — DX: Unstable angina: I20.0

## 2017-02-08 LAB — LIPID PANEL
CHOL/HDL RATIO: 4 ratio
CHOLESTEROL: 172 mg/dL (ref 0–200)
HDL: 43 mg/dL (ref >40)
LDL Cholesterol: 94 mg/dL (ref 0–99)
Triglycerides: 176 mg/dL — ABNORMAL HIGH (ref <150)
VLDL: 35 mg/dL (ref 0–40)

## 2017-02-08 LAB — TROPONIN I
Troponin I: <0.03 ng/mL (ref <0.03)
Troponin I: <0.03 ng/mL (ref <0.03)
Troponin I: <0.03 ng/mL (ref <0.03)

## 2017-02-08 LAB — BASIC METABOLIC PANEL
ANION GAP: 6 (ref 5–15)
BUN: 17 mg/dL (ref 6–20)
CHLORIDE: 103 mmol/L (ref 101–111)
CO2: 27 mmol/L (ref 22–32)
Calcium: 9.1 mg/dL (ref 8.9–10.3)
Creatinine, Ser: 1.36 mg/dL — ABNORMAL HIGH (ref 0.61–1.24)
GFR calc non Af Amer: 48 mL/min — ABNORMAL LOW (ref >60)
GFR, EST AFRICAN AMERICAN: 55 mL/min — AB (ref >60)
Glucose, Bld: 97 mg/dL (ref 65–99)
POTASSIUM: 4.1 mmol/L (ref 3.5–5.1)
Sodium: 136 mmol/L (ref 135–145)

## 2017-02-08 LAB — COMPREHENSIVE METABOLIC PANEL
ALT: 19 U/L (ref 17–63)
ANION GAP: 8 (ref 5–15)
AST: 29 U/L (ref 15–41)
Albumin: 3.4 g/dL — ABNORMAL LOW (ref 3.5–5.0)
Alkaline Phosphatase: 65 U/L (ref 38–126)
BILIRUBIN TOTAL: 0.6 mg/dL (ref 0.3–1.2)
BUN: 18 mg/dL (ref 6–20)
CO2: 25 mmol/L (ref 22–32)
Calcium: 9 mg/dL (ref 8.9–10.3)
Chloride: 101 mmol/L (ref 101–111)
Creatinine, Ser: 1.37 mg/dL — ABNORMAL HIGH (ref 0.61–1.24)
GFR calc Af Amer: 55 mL/min — ABNORMAL LOW (ref >60)
GFR, EST NON AFRICAN AMERICAN: 47 mL/min — AB (ref >60)
Glucose, Bld: 102 mg/dL — ABNORMAL HIGH (ref 65–99)
POTASSIUM: 4.1 mmol/L (ref 3.5–5.1)
SODIUM: 134 mmol/L — AB (ref 135–145)
TOTAL PROTEIN: 6.1 g/dL — AB (ref 6.5–8.1)

## 2017-02-08 LAB — CBC
HCT: 35.7 % — ABNORMAL LOW (ref 39.0–52.0)
Hemoglobin: 11.9 g/dL — ABNORMAL LOW (ref 13.0–17.0)
MCH: 29.5 pg (ref 26.0–34.0)
MCHC: 33.3 g/dL (ref 30.0–36.0)
MCV: 88.6 fL (ref 78.0–100.0)
PLATELETS: 194 10*3/uL (ref 150–400)
RBC: 4.03 MIL/uL — ABNORMAL LOW (ref 4.22–5.81)
RDW: 13.4 % (ref 11.5–15.5)
WBC: 4.8 10*3/uL (ref 4.0–10.5)

## 2017-02-08 LAB — CBC WITH DIFFERENTIAL/PLATELET
Basophils Absolute: 0 10*3/uL (ref 0.0–0.1)
Basophils Relative: 1 %
EOS ABS: 0.3 10*3/uL (ref 0.0–0.7)
EOS PCT: 4 %
HCT: 38 % — ABNORMAL LOW (ref 39.0–52.0)
Hemoglobin: 12.5 g/dL — ABNORMAL LOW (ref 13.0–17.0)
LYMPHS ABS: 1.1 10*3/uL (ref 0.7–4.0)
Lymphocytes Relative: 18 %
MCH: 29.2 pg (ref 26.0–34.0)
MCHC: 32.9 g/dL (ref 30.0–36.0)
MCV: 88.8 fL (ref 78.0–100.0)
Monocytes Absolute: 0.8 10*3/uL (ref 0.1–1.0)
Monocytes Relative: 14 %
Neutro Abs: 3.8 10*3/uL (ref 1.7–7.7)
Neutrophils Relative %: 63 %
PLATELETS: 202 10*3/uL (ref 150–400)
RBC: 4.28 MIL/uL (ref 4.22–5.81)
RDW: 13.5 % (ref 11.5–15.5)
WBC: 6 10*3/uL (ref 4.0–10.5)

## 2017-02-08 LAB — CREATININE, SERUM
CREATININE: 1.28 mg/dL — AB (ref 0.61–1.24)
GFR calc non Af Amer: 52 mL/min — ABNORMAL LOW (ref >60)
GFR, EST AFRICAN AMERICAN: 60 mL/min — AB (ref >60)

## 2017-02-08 LAB — TSH: TSH: 3.081 u[IU]/mL (ref 0.350–4.500)

## 2017-02-08 SURGERY — LEFT HEART CATH AND CORONARY ANGIOGRAPHY
Anesthesia: LOCAL

## 2017-02-08 MED ORDER — ASPIRIN 81 MG PO CHEW
324.0000 mg | CHEWABLE_TABLET | ORAL | Status: AC
  Administered 2017-02-08: 324 mg via ORAL
  Filled 2017-02-08: qty 4

## 2017-02-08 MED ORDER — AMLODIPINE BESYLATE 5 MG PO TABS
5.0000 mg | ORAL_TABLET | Freq: Every day | ORAL | Status: DC
  Administered 2017-02-08 – 2017-02-09 (×2): 5 mg via ORAL
  Filled 2017-02-08 (×2): qty 1

## 2017-02-08 MED ORDER — LIDOCAINE HCL (PF) 1 % IJ SOLN
INTRAMUSCULAR | Status: DC | PRN
  Administered 2017-02-08: 2 mL

## 2017-02-08 MED ORDER — SODIUM CHLORIDE 0.9 % IV SOLN: 250.0000 mL | INTRAVENOUS | Status: DC | PRN

## 2017-02-08 MED ORDER — RANOLAZINE ER 500 MG PO TB12
500.0000 mg | ORAL_TABLET | Freq: Two times a day (BID) | ORAL | Status: DC
  Administered 2017-02-08 – 2017-02-09 (×4): 500 mg via ORAL
  Filled 2017-02-08 (×4): qty 1

## 2017-02-08 MED ORDER — HEPARIN (PORCINE) IN NACL 2-0.9 UNIT/ML-% IJ SOLN
INTRAMUSCULAR | Status: AC
  Filled 2017-02-08: qty 500

## 2017-02-08 MED ORDER — IOPAMIDOL (ISOVUE-370) INJECTION 76%
INTRAVENOUS | Status: AC
  Filled 2017-02-08: qty 100

## 2017-02-08 MED ORDER — ONDANSETRON HCL 4 MG/2ML IJ SOLN: 4.0000 mg | Freq: Four times a day (QID) | INTRAMUSCULAR | Status: DC | PRN

## 2017-02-08 MED ORDER — ASPIRIN 81 MG PO CHEW
81.0000 mg | CHEWABLE_TABLET | ORAL | Status: AC
  Administered 2017-02-08: 81 mg via ORAL

## 2017-02-08 MED ORDER — ASPIRIN EC 81 MG PO TBEC
81.0000 mg | DELAYED_RELEASE_TABLET | Freq: Every day | ORAL | Status: DC
  Administered 2017-02-08 – 2017-02-09 (×2): 81 mg via ORAL
  Filled 2017-02-08 (×3): qty 1

## 2017-02-08 MED ORDER — FENTANYL CITRATE (PF) 100 MCG/2ML IJ SOLN
INTRAMUSCULAR | Status: DC | PRN
  Administered 2017-02-08: 25 ug via INTRAVENOUS

## 2017-02-08 MED ORDER — HEPARIN SODIUM (PORCINE) 5000 UNIT/ML IJ SOLN
5000.0000 [IU] | Freq: Three times a day (TID) | INTRAMUSCULAR | Status: DC
  Administered 2017-02-08: 5000 [IU] via SUBCUTANEOUS
  Filled 2017-02-08: qty 1

## 2017-02-08 MED ORDER — ATORVASTATIN CALCIUM 80 MG PO TABS
80.0000 mg | ORAL_TABLET | Freq: Every day | ORAL | Status: DC
  Administered 2017-02-08: 80 mg via ORAL
  Filled 2017-02-08: qty 1

## 2017-02-08 MED ORDER — PANTOPRAZOLE SODIUM 40 MG PO TBEC
40.0000 mg | DELAYED_RELEASE_TABLET | Freq: Every day | ORAL | Status: DC
  Administered 2017-02-08 – 2017-02-09 (×2): 40 mg via ORAL
  Filled 2017-02-08 (×2): qty 1

## 2017-02-08 MED ORDER — HEPARIN (PORCINE) IN NACL 2-0.9 UNIT/ML-% IJ SOLN
INTRAMUSCULAR | Status: AC | PRN
  Administered 2017-02-08: 1500 mL

## 2017-02-08 MED ORDER — ACETAMINOPHEN 325 MG PO TABS: 650.0000 mg | ORAL_TABLET | ORAL | Status: DC | PRN

## 2017-02-08 MED ORDER — HEPARIN SODIUM (PORCINE) 5000 UNIT/ML IJ SOLN
5000.0000 [IU] | Freq: Three times a day (TID) | INTRAMUSCULAR | Status: DC
  Administered 2017-02-08 – 2017-02-09 (×2): 5000 [IU] via SUBCUTANEOUS
  Filled 2017-02-08 (×3): qty 1

## 2017-02-08 MED ORDER — MIDAZOLAM HCL 2 MG/2ML IJ SOLN
INTRAMUSCULAR | Status: DC | PRN
  Administered 2017-02-08: 1 mg via INTRAVENOUS

## 2017-02-08 MED ORDER — VERAPAMIL HCL 2.5 MG/ML IV SOLN
INTRAVENOUS | Status: DC | PRN
  Administered 2017-02-08: 10 mL via INTRA_ARTERIAL

## 2017-02-08 MED ORDER — SODIUM CHLORIDE 0.9% FLUSH
3.0000 mL | Freq: Two times a day (BID) | INTRAVENOUS | Status: DC
  Administered 2017-02-08 – 2017-02-09 (×2): 3 mL via INTRAVENOUS

## 2017-02-08 MED ORDER — LEVOTHYROXINE SODIUM 50 MCG PO TABS
50.0000 ug | ORAL_TABLET | Freq: Every day | ORAL | Status: DC
  Administered 2017-02-08 – 2017-02-09 (×2): 50 ug via ORAL
  Filled 2017-02-08 (×2): qty 1

## 2017-02-08 MED ORDER — FENTANYL CITRATE (PF) 100 MCG/2ML IJ SOLN
INTRAMUSCULAR | Status: AC
  Filled 2017-02-08: qty 2

## 2017-02-08 MED ORDER — MIDAZOLAM HCL 2 MG/2ML IJ SOLN
INTRAMUSCULAR | Status: AC
  Filled 2017-02-08: qty 2

## 2017-02-08 MED ORDER — SODIUM CHLORIDE 0.9 % WEIGHT BASED INFUSION: 1.0000 mL/kg/h | INTRAVENOUS | Status: AC

## 2017-02-08 MED ORDER — IOPAMIDOL (ISOVUE-370) INJECTION 76%
INTRAVENOUS | Status: DC | PRN
  Administered 2017-02-08: 70 mL via INTRA_ARTERIAL

## 2017-02-08 MED ORDER — ASPIRIN 300 MG RE SUPP
300.0000 mg | RECTAL | Status: AC
  Filled 2017-02-08: qty 1

## 2017-02-08 MED ORDER — CHLORTHALIDONE 25 MG PO TABS
25.0000 mg | ORAL_TABLET | Freq: Every day | ORAL | Status: DC
  Administered 2017-02-08 – 2017-02-09 (×2): 25 mg via ORAL
  Filled 2017-02-08 (×2): qty 1

## 2017-02-08 MED ORDER — ISOSORBIDE MONONITRATE ER 60 MG PO TB24
60.0000 mg | ORAL_TABLET | Freq: Every day | ORAL | Status: DC
  Administered 2017-02-08 – 2017-02-09 (×2): 60 mg via ORAL
  Filled 2017-02-08 (×2): qty 1

## 2017-02-08 MED ORDER — SODIUM CHLORIDE 0.9% FLUSH: 3.0000 mL | INTRAVENOUS | Status: DC | PRN

## 2017-02-08 MED ORDER — SODIUM CHLORIDE 0.9 % IV SOLN
INTRAVENOUS | Status: DC
  Administered 2017-02-08: 06:00:00 via INTRAVENOUS

## 2017-02-08 MED ORDER — SODIUM CHLORIDE 0.9% FLUSH
3.0000 mL | INTRAVENOUS | Status: DC | PRN
  Administered 2017-02-09: 3 mL via INTRAVENOUS
  Filled 2017-02-08: qty 3

## 2017-02-08 MED ORDER — LIDOCAINE HCL (PF) 1 % IJ SOLN
INTRAMUSCULAR | Status: AC
Start: 2017-02-08 — End: ?
  Filled 2017-02-08: qty 30

## 2017-02-08 MED ORDER — HEPARIN SODIUM (PORCINE) 1000 UNIT/ML IJ SOLN
INTRAMUSCULAR | Status: AC
Start: 2017-02-08 — End: ?
  Filled 2017-02-08: qty 1

## 2017-02-08 MED ORDER — HEPARIN SODIUM (PORCINE) 1000 UNIT/ML IJ SOLN
INTRAMUSCULAR | Status: DC | PRN
  Administered 2017-02-08: 4500 [IU] via INTRAVENOUS

## 2017-02-08 MED ORDER — SODIUM CHLORIDE 0.9% FLUSH
3.0000 mL | Freq: Two times a day (BID) | INTRAVENOUS | Status: DC
  Administered 2017-02-08 (×2): 3 mL via INTRAVENOUS

## 2017-02-08 MED ORDER — SODIUM CHLORIDE 0.9% FLUSH
3.0000 mL | Freq: Two times a day (BID) | INTRAVENOUS | Status: DC
  Administered 2017-02-08: 3 mL via INTRAVENOUS

## 2017-02-08 MED ORDER — VERAPAMIL HCL 2.5 MG/ML IV SOLN
INTRAVENOUS | Status: AC
  Filled 2017-02-08: qty 2

## 2017-02-08 MED ORDER — NITROGLYCERIN 0.4 MG SL SUBL: 0.4000 mg | SUBLINGUAL_TABLET | SUBLINGUAL | Status: DC | PRN

## 2017-02-08 SURGICAL SUPPLY — 10 items
CATH 5FR JL3.5 JR4 ANG PIG MP (CATHETERS) ×2 IMPLANT
CATH LAUNCHER 5F RADL (CATHETERS) ×1 IMPLANT
CATHETER LAUNCHER 5F RADL (CATHETERS) ×2
GLIDESHEATH SLEND SS 6F .021 (SHEATH) ×2 IMPLANT
GUIDEWIRE INQWIRE 1.5J.035X260 (WIRE) ×1 IMPLANT
INQWIRE 1.5J .035X260CM (WIRE) ×2
KIT HEART LEFT (KITS) ×2 IMPLANT
PACK CARDIAC CATHETERIZATION (CUSTOM PROCEDURE TRAY) ×2 IMPLANT
TRANSDUCER W/STOPCOCK (MISCELLANEOUS) ×2 IMPLANT
TUBING CIL FLEX 10 FLL-RA (TUBING) ×2 IMPLANT

## 2017-02-08 NOTE — Progress Notes (Signed)
New pt admission from [facility]. Pt brought to the floor in stable condition. Vitals taken. Initial Assessment done. All immediate pertinent needs to patient addressed. Patient Guide given to patient. Important safety instructions relating to hospitalization reviewed with patient. Patient verbalized understanding. Paged cardiology for the patient's arrival to the unit. Will continue to monitor pt.

## 2017-02-08 NOTE — Interval H&P Note (Signed)
History and Physical Interval Note:  02/08/2017 12:04 PM  [name]  has presented today for surgery, with the diagnosis of cp  The various methods of treatment have been discussed with the patient and family. After consideration of risks, benefits and other options for treatment, the patient has consented to  Procedure(s): LEFT HEART CATH AND CORONARY ANGIOGRAPHY (N/A) as a surgical intervention .  The patient's history has been reviewed, patient examined, no change in status, stable for surgery.  I have reviewed the patient's chart and labs.  Questions were answered to the patient's satisfaction.   Cath Lab Visit (complete for each Cath Lab visit)  Clinical Evaluation Leading to the Procedure:   ACS: Yes.    Non-ACS:    Anginal Classification: CCS III  Anti-ischemic medical therapy: Maximal Therapy (2 or more classes of medications)  Non-Invasive Test Results: No non-invasive testing performed  Prior CABG: No previous CABG        [name] 02/08/2017 12:04 PM

## 2017-02-08 NOTE — Progress Notes (Signed)
Progress Note  Patient Name: [name] Date of Encounter: 02/08/2017  Primary Cardiologist: Agustin Cree  Subjective   No chest pain.   Inpatient Medications    Scheduled Meds: . amLODipine  5 mg Oral Daily  . aspirin EC  81 mg Oral Daily  . atorvastatin  80 mg Oral q1800  . chlorthalidone  25 mg Oral Daily  . heparin  5,000 Units Subcutaneous Q8H  . isosorbide mononitrate  60 mg Oral Daily  . levothyroxine  50 mcg Oral QAC breakfast  . pantoprazole  40 mg Oral Daily  . ranolazine  500 mg Oral BID  . sodium chloride flush  3 mL Intravenous Q12H  . sodium chloride flush  3 mL Intravenous Q12H   Continuous Infusions: . sodium chloride    . sodium chloride    . sodium chloride 75 mL/hr at 02/08/17 0539   PRN Meds: sodium chloride, sodium chloride, acetaminophen, nitroGLYCERIN, ondansetron (ZOFRAN) IV, sodium chloride flush, sodium chloride flush   Vital Signs    Vitals:   02/08/17 0017 02/08/17 0434 02/08/17 0500 02/08/17 0807  BP: (!) 150/61 (!) 173/70 (!) 160/80 (!) 165/59  Pulse: 74 64 62 65  Resp: 18 18  17   Temp: 97.8 F (36.6 C) 98.4 F (36.9 C)  98.2 F (36.8 C)  TempSrc: Oral Oral  Oral  SpO2: 95% 98%  96%  Weight: 191 lb 1.6 oz (86.7 kg)     Height: 5\' 4"  (1.626 m)       Intake/Output Summary (Last 24 hours) at 02/08/17 1042 Last data filed at 02/08/17 3810  Gross per 24 hour  Intake                0 ml  Output              600 ml  Net             -600 ml   Filed Weights   02/08/17 0017  Weight: 191 lb 1.6 oz (86.7 kg)    Telemetry    Vpaced - Personally Reviewed  ECG    Vpaced - Personally Reviewed  Physical Exam   General: Well developed, well nourished, male appearing in no acute distress. Head: Normocephalic, atraumatic.  Neck: Supple without bruits, JVD. Lungs:  Resp regular and unlabored, CTA. Heart: RRR, S1, S2, no S3, S4, or murmur; no rub. Abdomen: Soft, non-tender, non-distended with normoactive bowel sounds. No  hepatomegaly. No rebound/guarding. No obvious abdominal masses. Extremities: No clubbing, cyanosis, edema. Distal pedal pulses are 2+ bilaterally. Neuro: Alert and oriented X 3. Moves all extremities spontaneously. Psych: Normal affect.  Labs    Chemistry Recent Labs Lab 02/08/17 0204 02/08/17 0716  NA 134* 136  K 4.1 4.1  CL 101 103  CO2 25 27  GLUCOSE 102* 97  BUN 18 17  CREATININE 1.37* 1.36*  CALCIUM 9.0 9.1  PROT 6.1*  --   ALBUMIN 3.4*  --   AST 29  --   ALT 19  --   ALKPHOS 65  --   BILITOT 0.6  --   GFRNONAA 47* 48*  GFRAA 55* 55*  ANIONGAP 8 6     Hematology Recent Labs Lab 02/08/17 0204  WBC 6.0  RBC 4.28  HGB 12.5*  HCT 38.0*  MCV 88.8  MCH 29.2  MCHC 32.9  RDW 13.5  PLT 202    Cardiac Enzymes Recent Labs Lab 02/08/17 0204 02/08/17 0716  TROPONINI <0.03 <0.03   No  results for input(s): TROPIPOC in the last 168 hours.   BNPNo results for input(s): BNP, PROBNP in the last 168 hours.   DDimer No results for input(s): DDIMER in the last 168 hours.    Radiology    No results found.  Cardiac Studies     Patient Profile     82 y.o. 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   Assessment & Plan    1. CAD: Presented to [facility] with unstable angina and transferred to [facility] for further work up. Echo from [place] showed EF of 50-55%. EKG this morning showed vpacing with TWI in Lead I, and aVL. Seen by Dr. Aundra Dubin this am and planned for cardiac cath today. Trop negx2.  -- continue ASA, Imdur, Ranexa, and statin (added this admission)  2. Carotid Stenosis: carotid dopplers pending  3. PAF: SR on telemetry. Was on Xarelto in the past, but developed chest pain. ChadsVasc 4.   4. Hypothyroidism: on Levoxyl, TSH 3.081  5. 2nd degree HB: [employer] PPM  Signed, Reino Bellis, NP  02/08/2017, 10:42 AM  Pager # [phone]   See my note today.    Lauree Chandler 02/08/2017 3:57 PM

## 2017-02-08 NOTE — Discharge Summary (Signed)
Discharge Summary    Patient ID: [name],  MRN: [id number], DOB/AGE: Aug 05, 1934 82 y.o.  Admit date: 02/08/2017 Discharge date: 02/09/2017  Primary Care Provider: No primary care provider on file. Primary Cardiologist: Agustin Cree   Discharge Diagnoses    Active Problems:   Unstable angina ([place])   Allergies No Known Allergies  Diagnostic Studies/Procedures    LHC: 02/08/17  Conclusion     Mid RCA lesion, 60 %stenosed.  Prox LAD lesion, 40 %stenosed.  Prox LAD to Mid LAD lesion, 50 %stenosed.  LPDA lesion, 60 %stenosed.   1. Nonobstructive CAD  Plan: medical management.    _____________   History of Present Illness     [name] was generally doing well until 4-5 days prior to admission.  He works regularly in his [employer].  Normally, he does not have exertional dyspnea or chest pain.  However, he is on both ranolazine and Imdur.  He says that he has never had a heart catheterization and unable to find record of a heart cath on Care Everywhere.  He apparently has had chest pain at times in the past.  His last functional study appeared to have been a Cardiolite in 4/17 that per later notes (do not have original report) showed no ischemia.  He has a history of paroxysmal atrial fibrillation, but has refused anticoagulation since taking Xarelto and thinking that it caused chest pain.    4-5 days prior to admission, he started to develop central chest tightness after walking a short distance (around house).  Chest pain resolved with rest.  He has had multiple episodes a day.  He also felt short of breath walking a short distance. Given these symptoms, he went to the ER at [facility] and was admitted.  He was transferred from there to [facility].  At [place], he had an echo showing EF 50-55%, mild MR, mild AI, mild TR.  ECG per report showed a LBBB and NSR, but we were not sent a copy.  Troponin was negative based on labs sent to [facility] with him. No  chest pain on arrival.   Hospital Course     He underwent LHC with Dr. Martinique with non obstructive CAD noted. Post cath labs showed stable Cr 1.28 and Hgb 11.9. Of note Hx of PAF, but noted to be in SR with Vpacing during admission. Addressed the idea of [place] with him again, but patient not agreeable to start at this time. No complications noted post cath.  He was seen by Dr. Burt Knack and determined stable for discharge home. Follow up in the office has been arranged. Medications are listed below.     General: Well developed, well nourished, male appearing in no acute distress. Head: Normocephalic, atraumatic.  Neck: Supple without bruits, JVD. Lungs:  Resp regular and unlabored, CTA. Heart: RRR, S1, S2, no S3, S4, or murmur; no rub. Abdomen: Soft, non-tender, non-distended with normoactive bowel sounds. No hepatomegaly. No rebound/guarding. No obvious abdominal masses. Extremities: No clubbing, cyanosis, edema. Distal pedal pulses are 2+ bilaterally. R radial cath site stable without bruising or hematoma Neuro: Alert and oriented X 3. Moves all extremities spontaneously. Psych: Normal affect.  _____________  Discharge Vitals Blood pressure (!) 163/70, pulse 72, temperature 98.6 F (37 C), temperature source Oral, resp. rate 18, height 5\' 4"  (1.626 m), weight 190 lb 7.6 oz (86.4 kg), SpO2 97 %.  Filed Weights   02/08/17 0017 02/09/17 0601  Weight: 191 lb 1.6 oz (86.7  kg) 190 lb 7.6 oz (86.4 kg)    Labs & Radiologic Studies    CBC  Recent Labs  02/08/17 0204 02/08/17 1423  WBC 6.0 4.8  NEUTROABS 3.8  --   HGB 12.5* 11.9*  HCT 38.0* 35.7*  MCV 88.8 88.6  PLT 202 416   Basic Metabolic Panel  Recent Labs  02/08/17 0204 02/08/17 0716 02/08/17 1423  NA 134* 136  --   K 4.1 4.1  --   CL 101 103  --   CO2 25 27  --   GLUCOSE 102* 97  --   BUN 18 17  --   CREATININE 1.37* 1.36* 1.28*  CALCIUM 9.0 9.1  --    Liver Function Tests  Recent Labs  02/08/17 0204  AST 29    ALT 19  ALKPHOS 65  BILITOT 0.6  PROT 6.1*  ALBUMIN 3.4*   No results for input(s): LIPASE, AMYLASE in the last 72 hours. Cardiac Enzymes  Recent Labs  02/08/17 0204 02/08/17 0716 02/08/17 1319  TROPONINI <0.03 <0.03 <0.03   BNP Invalid input(s): POCBNP D-Dimer No results for input(s): DDIMER in the last 72 hours. Hemoglobin A1C No results for input(s): HGBA1C in the last 72 hours. Fasting Lipid Panel  Recent Labs  02/08/17 0204  CHOL 172  HDL 43  LDLCALC 94  TRIG 176*  CHOLHDL 4.0   Thyroid Function Tests  Recent Labs  02/08/17 0204  TSH 3.081   _____________  No results found. Disposition   Pt is being discharged home today in good condition.  Follow-up Plans & Appointments    Follow-up Information    [facility] On 02/22/2017.   Specialty:  Cardiology Why:  at 10am for your follow up appt.  Contact information: [address] [place]       Lillard Anes, MD. Go on 02/10/2017.   Specialty:  Family Medicine Why:  keep appointment on 8/23 Contact information: [place] [address] [phone]          Discharge Instructions    Call MD for:  redness, tenderness, or signs of infection (pain, swelling, redness, odor or green/yellow discharge around incision site)    Complete by:  As directed    Diet - low sodium heart healthy    Complete by:  As directed    Discharge instructions    Complete by:  As directed    Radial Site Care Refer to this sheet in the next few weeks. These instructions provide you with information on caring for yourself after your procedure. Your caregiver may also give you more specific instructions. Your treatment has been planned according to current medical practices, but problems sometimes occur. Call your caregiver if you have any problems or questions after your procedure. HOME CARE INSTRUCTIONS You may  shower the day after the procedure.Remove the bandage (dressing) and gently wash the site with plain soap and water.Gently pat the site dry.  Do not apply powder or lotion to the site.  Do not submerge the affected site in water for 3 to 5 days.  Inspect the site at least twice daily.  Do not flex or bend the affected arm for 24 hours.  No lifting over 5 pounds (2.3 kg) for 5 days after your procedure.  Do not drive home if you are discharged the same day of the procedure. Have someone else drive you.  You may drive  24 hours after the procedure unless otherwise instructed by your caregiver.  What to expect: Any bruising will usually fade within 1 to 2 weeks.  Blood that collects in the tissue (hematoma) may be painful to the touch. It should usually decrease in size and tenderness within 1 to 2 weeks.  SEEK IMMEDIATE MEDICAL CARE IF: You have unusual pain at the radial site.  You have redness, warmth, swelling, or pain at the radial site.  You have drainage (other than a small amount of blood on the dressing).  You have chills.  You have a fever or persistent symptoms for more than 72 hours.  You have a fever and your symptoms suddenly get worse.  Your arm becomes pale, cool, tingly, or numb.  You have heavy bleeding from the site. Hold pressure on the site.   Increase activity slowly    Complete by:  As directed       Discharge Medications   Discharge Medication List as of 02/09/2017  2:10 PM    CONTINUE these medications which have CHANGED   Details  chlorthalidone (HYGROTON) 25 MG tablet Take 1 tablet (25 mg total) by mouth daily., Starting Wed 02/09/2017, Normal      CONTINUE these medications which have NOT CHANGED   Details  albuterol (PROVENTIL HFA;VENTOLIN HFA) 108 (90 Base) MCG/ACT inhaler Inhale 2 puffs into the lungs 4 (four) times daily as needed for wheezing or shortness of breath., Historical Med    amLODipine (NORVASC) 5 MG tablet Take 5 mg by mouth daily.,  Historical Med    aspirin 325 MG tablet Take 325 mg by mouth daily., Historical Med    atorvastatin (LIPITOR) 40 MG tablet Take 40 mg by mouth daily., Historical Med    Calcium Carbonate-Vitamin D (CALCIUM 500 + D PO) Take 1 tablet by mouth daily., Historical Med    fluticasone (FLOVENT HFA) 110 MCG/ACT inhaler Inhale 2 puffs into the lungs 2 (two) times daily., Historical Med    folic acid (FOLVITE) 539 MCG tablet Take 400 mcg by mouth daily., Historical Med    isosorbide mononitrate (IMDUR) 60 MG 24 hr tablet Take 60 mg by mouth daily., Historical Med    levothyroxine (SYNTHROID, LEVOTHROID) 50 MCG tablet Take 50 mcg by mouth daily before breakfast., Historical Med    Multiple Vitamins-Minerals (MULTIVITAMIN PO) Take 1 tablet by mouth daily., Historical Med    nitroGLYCERIN (NITROSTAT) 0.4 MG SL tablet Place 0.4 mg under the tongue every 5 (five) minutes as needed for chest pain., Historical Med    omeprazole (PRILOSEC OTC) 20 MG tablet Take 20 mg by mouth daily., Historical Med    ranolazine (RANEXA) 500 MG 12 hr tablet Take 500 mg by mouth 2 (two) times daily., Historical Med    vitamin B-12 (CYANOCOBALAMIN) 250 MCG tablet Take 250 mcg by mouth daily., Historical Med          Outstanding Labs/Studies   N/A  Duration of Discharge Encounter   Greater than 30 minutes including physician time.  Signed, Reino Bellis NP-C 02/09/2017, 4:35 PM  Patient seen, examined. Available data reviewed. Agree with findings, assessment, and plan as outlined by Reino Bellis, NP-C. My exam today: Vitals:   02/09/17 0944 02/09/17 1139  BP: (!) 172/81 (!) 163/70  Pulse:  72  Resp:  18  Temp:  98.6 F (37 C)  SpO2:  97%   Pt is alert and oriented, NAD HEENT: normal Neck: JVP - normal Lungs: CTA bilaterally CV: RRR with 2/6  Systolic murmur at the LSB Abd: soft, NT, Positive BS, no hepatomegaly Ext: no C/C/E, distal pulses intact and equal, right radial site clear Skin:  warm/dry no rash  The patient's cardiac catheterization study is reviewed. He has nonobstructive coronary artery disease. Laboratory data is reviewed. He is stable for discharge today. He will follow-up with his primary cardiologist in [place]. He has declined a chronic oral anticoagulant drug for paroxysmal atrial fibrillation. There has been no atrial fibrillation identified on his telemetry while he has been here.  Sherren Mocha, M.D. 02/09/2017 4:35 PM

## 2017-02-08 NOTE — Progress Notes (Signed)
Pt slept well, pt is aware about cardiac cath, NPO, consent been taken, extra IV provided by IV team, NS started at 75cc/hr, pre-cath Aspirin given 81mg , will contiue to monitor

## 2017-02-08 NOTE — Progress Notes (Signed)
   02/08/17 1300  First Vascular Site Assessment  #1 - Location of Site Assessment Right radial  #1 - Vascular Site Assessment Scale Level 1  #1 - Air in TR Band 16 cc (2 cc added, pt arrived from cath lab with bleeding noted)

## 2017-02-08 NOTE — Progress Notes (Signed)
Pt admitted with chest pain. Cardiac cath today with moderate non-obstructive disease. Plans for medical management of CAD. He is on ASA/statin/Ranexa and Imdur. Not clear why he is not on a beta blocker, HR is mid 60s and he has pacemaker. He is having some oozing from his radial cath site. Will monitor overnight tonight. I addressed anti-coagulation given his atrial fib. He tried Xarelto in the past and it caused chest pain. He may be willing to try another agent. May be best to discuss with his cardiologist as an outpatient.   Lauree Chandler 02/08/2017 3:56 PM

## 2017-02-08 NOTE — Consult Note (Signed)
   [facility] CM Inpatient Consult   02/08/2017  [name] [birth year] [id number]   Patient was screened for [facility] Care Management needs in the [place].  Patient admitted with unstable angina and scheduled for a cardiac catheterization today.  Met briefly with the patient at the bedside.  Patient endorses Dr. Reinaldo Meeker, "he's the regular doctor I see in [place]."  Patient denies any issues with transportation, medications, or social needs at this time.  Explained [facility] Management as a benefit and the patient accepted a brochure, 24 hour magnet for nurse advise line and contact information.  No needs identified. For questions or referrals, please contact:  Natividad Brood, RN BSN [facility] Liaison  [phone] business mobile phone Toll free office [phone]

## 2017-02-08 NOTE — H&P (Signed)
Cardiology Admission History and Physical:   Patient ID: [name]; MRN: [id number]; DOB: Nov 07, 1934   Admission date: 02/08/2017  Primary Care Provider: No primary care provider on file. Primary Cardiologist: Agustin Cree  Chief Complaint:  Chest pain  Patient Profile:   DOMONIQUE BROUILLARD is a 82 y.o. 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   History of Present Illness:   Mr. Kouba was generally doing well until 4-5 days ago.  He works regularly in his Chi[employer].  Normally, he does not have exertional dyspnea or chest pain.  However, he is on both ranolazine and Imdur.  He says that he has never had a heart catheterization and I can find no record of a heart cath on Care Everywhere.  He apparently has had chest pain at times in the past.  His last functional study appears to have been a Cardiolite in 4/17 that per later notes (do not have original report) showed no ischemia.  He has a history of paroxysmal atrial fibrillation, but has refused anticoagulation since taking Xarelto and thinking that it caused chest pain.    4-5 days ago, he started to develop central chest tightness after walking a short distance (around house).  Chest pain resolves with rest.  He has had multiple episodes a day.  He also feels short of breath walking a short distance.  Given these symptoms, he went to the ER at [facility] and was admitted.  He was transferred from there to [facility].  At [place], he had an echo showing EF 50-55%, mild MR, mild AI, mild TR.  ECG per report showed a LBBB and NSR, but we were not sent a copy.  Troponin was negative based on labs sent up here with him. Currently, no chest pain.   PMH: 1. Atrial fibrillation: Paroxysmal.  Had chest pain with Xarelto and has refused further anticoagulation.  2. 2nd degree heart block: s/p [employer] PPM.  3. Hypothyroidism 4. Asthma  5. B12  deficiency 6. Carotid stenosis: Last dopplers in 2016 showed occluded RICA.  7. GERD 8. Chest pain: Cardiolite (4/17) with no ischemia per notes.  9. HTN 10. Hyperlipidemia  Medications Prior to Admission: ASA 325  Amlodipine 5 daily Imdur 60 daily Atorvastatin 10 daily Levoxyl 50 daily Omeprazole 20 daily Ranolazine 500 bid Chlorthalidone 25 daily       Allergies:   Not on File  Social History:   Married, lives in [place], never smoked, drinks about 2 beers/day.    Family History:   Mother with prior MI.   ROS:  All systems reviewed and negative except as per HPI.   Physical Exam/Data:   Vitals:   02/08/17 0017  BP: (!) 150/61  Pulse: 74  Resp: 18  Temp: 97.8 F (36.6 C)  TempSrc: Oral  SpO2: 95%  Weight: 191 lb 1.6 oz (86.7 kg)  Height: 5\' 4"  (1.626 m)   No intake or output data in the 24 hours ending 02/08/17 0116 Filed Weights   02/08/17 0017  Weight: 191 lb 1.6 oz (86.7 kg)   Body mass index is 32.8 kg/m.  General: NAD Neck: No JVD, no thyromegaly or thyroid nodule.  Lungs: Clear to auscultation bilaterally with normal respiratory effort. CV: Nondisplaced PMI.  Heart regular S1/S2, no S3/S4, 2/6 SEM RUSB.  No peripheral edema. Left carotid bruit.  Normal pedal pulses.  Abdomen: Soft, nontender, no hepatosplenomegaly, no distention.  Skin: Intact without lesions or rashes.  Neurologic: Alert and oriented x 3.  Psych: Normal affect. Extremities: No clubbing or cyanosis.  HEENT: Normal.   EKG:  The ECG that was done at [facility] reportedly showed NSR with LBBB but is not available.    Laboratory Data: K 4.2, creatinine 1.1, troponin < 0.01, pro-BNP 635, hgb 13.8  Assessment and Plan:   BRANDOM KERWIN is a 82 y.o. 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  1. CAD: I can find no record of prior cath but he has had episodes of chest pain and is on  anti-anginals at home.  Cardiolite in 4/17 per notes showed no ischemia.  ECG is not available but per notes showed NSR with LBBB.  Troponin negative at [place].  Patient's symptoms are concerning for unstable angina (chest tightness with minimal exertion).  He is currently chest pain free.  Echo at [place] with EF 50-55%.  - Will obtain ECG here.  - Continue to cycle troponin.  - Can hold off on IV heparin unless troponin returns positive (he is no having chest pain actively).  - Continue ASA, add atorvastatin 80 daily.  Continue home Imdur and ranolazine for now.  - I think he need coronary angiography.  I discussed risks/benefits of procedure and he agrees to proceed.   2. Carotid stenosis: RICA occlusion on prior study, has left bruit.  Will order carotid dopplers.  3. Atrial fibrillation: NSR currently on telemetry.  Paroxysmal AF, has refused anticoagulation multiple times in the past since he developed chest pain while on Xarelto.  Can re-address anticoagulation during this hospitalization.  CHADSVASC = 4.  4. Hypothyroidism: Continue Levoxyl.  5. 2nd degree heart block: Has MDT PPM.   Signed, Loralie Champagne, MD  02/08/2017 1:16 AM

## 2017-02-09 ENCOUNTER — Inpatient Hospital Stay (HOSPITAL_COMMUNITY): Payer: PPO

## 2017-02-09 DIAGNOSIS — R0989 Other specified symptoms and signs involving the circulatory and respiratory systems: Secondary | ICD-10-CM | POA: Diagnosis not present

## 2017-02-09 DIAGNOSIS — R079 Chest pain, unspecified: Secondary | ICD-10-CM

## 2017-02-09 DIAGNOSIS — I25110 Atherosclerotic heart disease of native coronary artery with unstable angina pectoris: Secondary | ICD-10-CM | POA: Diagnosis not present

## 2017-02-09 MED ORDER — CHLORTHALIDONE 25 MG PO TABS: 25.0000 mg | ORAL_TABLET | Freq: Every day | ORAL | 1 refills | Status: DC

## 2017-02-09 MED FILL — Lidocaine HCl Local Inj 1%: INTRAMUSCULAR | Qty: 20 | Status: AC

## 2017-02-09 NOTE — Progress Notes (Signed)
Discharged instructions reviewed with pt and wife verb understanding. Pt left via wheelchair in NAD with all belongings at side.sd

## 2017-02-09 NOTE — Progress Notes (Signed)
Patient doppler is complete and ok to be discharged per [facility].

## 2017-02-09 NOTE — Progress Notes (Signed)
CARDIAC REHAB PHASE I   PRE:  Rate/Rhythm: 74 paced  BP:  Sitting: 190/70        SaO2: 98 RA  MODE:  Ambulation: 400 ft   POST:  Rate/Rhythm: 89 paced  BP:  Sitting: 137/82         SaO2: 98 RA  Pt ambulated 400 ft on RA, IV, assist x1, steady gait, tolerated well with no complaints. BP somewhat elevated, has not received his morning medicine. Reviewed restrictions, ntg, exercise guidelines, and heart healthy diet handout. Pt verbalized understanding. Pt not a candidate for phase 2 cardiac rehab (no qualifying diagnosis). Pt to edge of bed per pt request after walk, call bell within reach.   [phone] Lenna Sciara, RN, BSN 02/09/2017 9:44 AM

## 2017-02-09 NOTE — Progress Notes (Signed)
VASCULAR LAB PRELIMINARY  PRELIMINARY  PRELIMINARY  PRELIMINARY  Carotid duplex completed.    Preliminary report:  Right - Known occlusion sine 2016. Vertebral artery flow is antegrade. Left - 1% to 39% ICA stenosis. Vertebral artery flow is antegrade.  Fausto Sampedro, RVS 02/09/2017, 1:51 PM

## 2017-02-22 ENCOUNTER — Encounter: Payer: Self-pay | Admitting: Cardiology

## 2017-02-22 ENCOUNTER — Ambulatory Visit (INDEPENDENT_AMBULATORY_CARE_PROVIDER_SITE_OTHER): Payer: PPO | Admitting: Cardiology

## 2017-02-22 VITALS — BP 116/60 | HR 84 | Resp 14 | Ht 64.0 in | Wt 192.8 lb

## 2017-02-22 DIAGNOSIS — I442 Atrioventricular block, complete: Secondary | ICD-10-CM

## 2017-02-22 DIAGNOSIS — I480 Paroxysmal atrial fibrillation: Secondary | ICD-10-CM

## 2017-02-22 DIAGNOSIS — I739 Peripheral vascular disease, unspecified: Secondary | ICD-10-CM | POA: Diagnosis not present

## 2017-02-22 MED ORDER — NITROGLYCERIN 0.4 MG SL SUBL: 0.4000 mg | SUBLINGUAL_TABLET | SUBLINGUAL | 5 refills | Status: DC | PRN

## 2017-02-22 MED ORDER — APIXABAN 5 MG PO TABS: 5.0000 mg | ORAL_TABLET | Freq: Two times a day (BID) | ORAL | 11 refills | Status: DC

## 2017-02-22 NOTE — Addendum Note (Signed)
Addended by: Aleatha Borer on: 02/22/2017 12:07 PM   Modules accepted: Orders

## 2017-02-22 NOTE — Patient Instructions (Addendum)
Medication Instructions:  Your physician has recommended you make the following change in your medication: Start Eliquis 5 mg 1 tablet twice a day, after being contacted with the results of todays labs. A prescription has been sent to th pharmacy as well however do not pick the medication up until you finish the samples.  STOP aspirin once we advise you that you can start Eliquis.   Labwork: Your physician recommends that you have lab work today to check you blood counts before starting blood thinner.   Testing/Procedures: None   Follow-Up: Your physician recommends that you schedule a follow-up appointment in: 3 months   Any Other Special Instructions Will Be Listed Below (If Applicable).  Please note that any paperwork needing to be filled out by the provider will need to be addressed at the front desk prior to seeing the provider. Please note that any paperwork FMLA, Disability or other documents regarding health condition is subject to a $25.00 charge that must be received prior to completion of paperwork in the form of a money order or check.     If you need a refill on your cardiac medications before your next appointment, please call your pharmacy.

## 2017-02-22 NOTE — Addendum Note (Signed)
Addended by: Kathyrn Sheriff on: 02/22/2017 10:54 AM   Modules accepted: Orders

## 2017-02-22 NOTE — Progress Notes (Signed)
Cardiology Office Note:    Date:  02/22/2017   ID:  [name], DOB 1934/11/26, MRN [id number]  PCP:  Lillard Anes, MD  Cardiologist:  Jenne Campus, MD    Referring MD: No ref. provider found   Chief Complaint  Patient presents with  . Hospitalization Follow-up  Visit after recent hospitalization  History of Present Illness:    [name] is a 82 y.o. male  with complete heart block, paroxysmal mitral fibrillation, presumed coronary artery disease. He started experiencing chest tightness and exertional shortness of breath and up going to [facility], after that he was transferred to [facility] for cardiac catheterization cardiac catheterization did not show any critical obstructive lesions, he was discharged home. Since that time he is doing well. 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 He is doing well. We talked again about anticoagulation he does have proximal mitral fibrillation which CHADS2Vas score of 4. Finally he agreed to it. I will discontinue his aspirin and I will check his CBC, I will give him samples of Eliquis 5 g twice daily.  Past Medical History:  Diagnosis Date  . Asthma   . Peripheral vascular disease [facility])     Past Surgical History:  Procedure Laterality Date  . CATARACT EXTRACTION    . HIP SURGERY    . INSERT / REPLACE / REMOVE PACEMAKER     [employer]  . LEFT HEART CATH AND CORONARY ANGIOGRAPHY N/A 02/08/2017   Procedure: LEFT HEART CATH AND CORONARY ANGIOGRAPHY;  Surgeon: Martinique, Peter M, MD;  Location: [facility];  Service: Cardiovascular;  Laterality: N/A;    Current Medications: Current Meds  Medication Sig  . albuterol (PROVENTIL HFA;VENTOLIN HFA) 108 (90 Base) MCG/ACT inhaler Inhale 2 puffs into the lungs 4 (four) times daily as needed for wheezing or shortness of breath.  Marland Kitchen amLODipine (NORVASC) 5 MG tablet Take 5 mg by mouth daily.  Marland Kitchen aspirin 325 MG  tablet Take 325 mg by mouth daily.  Marland Kitchen atorvastatin (LIPITOR) 40 MG tablet Take 40 mg by mouth daily.  . Calcium Carbonate-Vitamin D (CALCIUM 500 + D PO) Take 1 tablet by mouth daily.  . chlorthalidone (HYGROTON) 25 MG tablet Take 1 tablet (25 mg total) by mouth daily.  . fluticasone (FLOVENT HFA) 110 MCG/ACT inhaler Inhale 2 puffs into the lungs 2 (two) times daily.  . folic acid (FOLVITE) 440 MCG tablet Take 400 mcg by mouth daily.  . isosorbide mononitrate (IMDUR) 60 MG 24 hr tablet Take 60 mg by mouth daily.  Marland Kitchen levothyroxine (SYNTHROID, LEVOTHROID) 50 MCG tablet Take 50 mcg by mouth daily before breakfast.  . Multiple Vitamins-Minerals (MULTIVITAMIN PO) Take 1 tablet by mouth daily.  . nitroGLYCERIN (NITROSTAT) 0.4 MG SL tablet Place 0.4 mg under the tongue every 5 (five) minutes as needed for chest pain.  Marland Kitchen omeprazole (PRILOSEC OTC) 20 MG tablet Take 20 mg by mouth daily.  . ranolazine (RANEXA) 500 MG 12 hr tablet Take 500 mg by mouth 2 (two) times daily.  . vitamin B-12 (CYANOCOBALAMIN) 250 MCG tablet Take 250 mcg by mouth daily.     Allergies:   Patient has no known allergies.   Social History   Social History  . Marital status: Married    Spouse name: N/A  . Number of children: N/A  . Years of education: N/A   Social History Main Topics  . Smoking status: Never Smoker  .  Smokeless tobacco: Never Used  . Alcohol use Yes  . Drug use: No  . Sexual activity: Not Asked   Other Topics Concern  . None   Social History Narrative  . None     Family History: The patient's family history includes Heart attack in his mother. ROS:   Please see the history of present illness.    All 14 point review of systems negative except as described per history of present illness  EKGs/Labs/Other Studies Reviewed:      Recent Labs: 02/08/2017: ALT 19; BUN 17; Creatinine, Ser 1.28; Hemoglobin 11.9; Platelets 194; Potassium 4.1; Sodium 136; TSH 3.081  Recent Lipid Panel    Component  Value Date/Time   CHOL 172 02/08/2017 0204   TRIG 176 (H) 02/08/2017 0204   HDL 43 02/08/2017 0204   CHOLHDL 4.0 02/08/2017 0204   VLDL 35 02/08/2017 0204   LDLCALC 94 02/08/2017 0204    Physical Exam:    VS:  BP 116/60   Pulse 84   Resp 14   Ht 5\' 4"  (1.626 m)   Wt 192 lb 12.8 oz (87.5 kg)   BMI 33.09 kg/m     Wt Readings from Last 3 Encounters:  02/22/17 192 lb 12.8 oz (87.5 kg)  02/09/17 190 lb 7.6 oz (86.4 kg)     GEN:  Well nourished, well developed in no acute distress HEENT: Normal NECK: No JVD; No carotid bruits LYMPHATICS: No lymphadenopathy CARDIAC: RRR, no murmurs, no rubs, no gallops RESPIRATORY:  Clear to auscultation without rales, wheezing or rhonchi  ABDOMEN: Soft, non-tender, non-distended MUSCULOSKELETAL:  No edema; No deformity  SKIN: Warm and dry LOWER EXTREMITIES: no swelling NEUROLOGIC:  Alert and oriented x 3 PSYCHIATRIC:  Normal affect   ASSESSMENT:    1. Paroxysmal atrial fibrillation (HCC)   2. Peripheral vascular disease ([place])   3. Complete heart block (HCC)    PLAN:    In order of problems listed above:  1. Paroxysmal mitral fibrillation: Finally agreed for anticoagulation which I will initiate after checking his CBC. 2. Peripheral vascular disease, asymptomatic, he does have completely occluded carotid arterial disease. 3. Complete heart block: Recent interrogation of his device showed normal function. We'll follow-up.  All information from [facility] as well as from [place], hospitalization reviewed with the patient.   Medication Adjustments/Labs and Tests Ordered: Current medicines are reviewed at length with the patient today.  Concerns regarding medicines are outlined above.  No orders of the defined types were placed in this encounter.  Medication changes: No orders of the defined types were placed in this encounter.   Signed, Park Liter, MD, [facility] 02/22/2017 10:37 AM    [place]

## 2017-02-24 LAB — CBC WITH DIFFERENTIAL/PLATELET
BASOS ABS: 0.1 10*3/uL (ref 0.0–0.2)
BASOS: 1 %
EOS (ABSOLUTE): 0.3 10*3/uL (ref 0.0–0.4)
Eos: 4 %
HEMOGLOBIN: 13 g/dL (ref 13.0–17.7)
Hematocrit: 39.8 % (ref 37.5–51.0)
Immature Grans (Abs): 0 10*3/uL (ref 0.0–0.1)
Immature Granulocytes: 0 %
LYMPHS ABS: 1.5 10*3/uL (ref 0.7–3.1)
Lymphs: 18 %
MCH: 30.2 pg (ref 26.6–33.0)
MCHC: 32.7 g/dL (ref 31.5–35.7)
MCV: 92 fL (ref 79–97)
Monocytes Absolute: 1 10*3/uL — ABNORMAL HIGH (ref 0.1–0.9)
Monocytes: 12 %
NEUTROS ABS: 5.4 10*3/uL (ref 1.4–7.0)
Neutrophils: 65 %
PLATELETS: 279 10*3/uL (ref 150–379)
RBC: 4.31 x10E6/uL (ref 4.14–5.80)
RDW: 13.8 % (ref 12.3–15.4)
WBC: 8.3 10*3/uL (ref 3.4–10.8)

## 2017-02-28 ENCOUNTER — Other Ambulatory Visit: Payer: Self-pay | Admitting: Cardiology

## 2017-04-18 DIAGNOSIS — C61 Malignant neoplasm of prostate: Secondary | ICD-10-CM | POA: Diagnosis not present

## 2017-04-18 DIAGNOSIS — I10 Essential (primary) hypertension: Secondary | ICD-10-CM | POA: Diagnosis not present

## 2017-04-18 DIAGNOSIS — I69351 Hemiplegia and hemiparesis following cerebral infarction affecting right dominant side: Secondary | ICD-10-CM | POA: Diagnosis not present

## 2017-04-18 DIAGNOSIS — I2510 Atherosclerotic heart disease of native coronary artery without angina pectoris: Secondary | ICD-10-CM | POA: Diagnosis not present

## 2017-04-18 DIAGNOSIS — E038 Other specified hypothyroidism: Secondary | ICD-10-CM | POA: Diagnosis not present

## 2017-04-18 DIAGNOSIS — F5102 Adjustment insomnia: Secondary | ICD-10-CM | POA: Diagnosis not present

## 2017-04-18 DIAGNOSIS — I359 Nonrheumatic aortic valve disorder, unspecified: Secondary | ICD-10-CM | POA: Diagnosis not present

## 2017-04-18 DIAGNOSIS — E782 Mixed hyperlipidemia: Secondary | ICD-10-CM | POA: Diagnosis not present

## 2017-04-18 DIAGNOSIS — J449 Chronic obstructive pulmonary disease, unspecified: Secondary | ICD-10-CM | POA: Diagnosis not present

## 2017-04-20 DIAGNOSIS — R531 Weakness: Secondary | ICD-10-CM | POA: Diagnosis not present

## 2017-04-20 DIAGNOSIS — I69351 Hemiplegia and hemiparesis following cerebral infarction affecting right dominant side: Secondary | ICD-10-CM | POA: Diagnosis not present

## 2017-04-20 DIAGNOSIS — I639 Cerebral infarction, unspecified: Secondary | ICD-10-CM | POA: Diagnosis not present

## 2017-05-03 DIAGNOSIS — R1084 Generalized abdominal pain: Secondary | ICD-10-CM | POA: Diagnosis not present

## 2017-05-03 DIAGNOSIS — R1110 Vomiting, unspecified: Secondary | ICD-10-CM | POA: Diagnosis not present

## 2017-05-03 DIAGNOSIS — R109 Unspecified abdominal pain: Secondary | ICD-10-CM | POA: Diagnosis not present

## 2017-05-03 DIAGNOSIS — R140 Abdominal distension (gaseous): Secondary | ICD-10-CM | POA: Diagnosis not present

## 2017-05-11 DIAGNOSIS — I2510 Atherosclerotic heart disease of native coronary artery without angina pectoris: Secondary | ICD-10-CM | POA: Diagnosis not present

## 2017-05-11 DIAGNOSIS — F322 Major depressive disorder, single episode, severe without psychotic features: Secondary | ICD-10-CM | POA: Diagnosis not present

## 2017-05-13 DIAGNOSIS — Z950 Presence of cardiac pacemaker: Secondary | ICD-10-CM | POA: Diagnosis not present

## 2017-05-25 ENCOUNTER — Encounter: Payer: Self-pay | Admitting: Cardiology

## 2017-05-25 ENCOUNTER — Ambulatory Visit (INDEPENDENT_AMBULATORY_CARE_PROVIDER_SITE_OTHER): Payer: PPO | Admitting: Cardiology

## 2017-05-25 VITALS — BP 140/64 | HR 74 | Ht 64.0 in | Wt 192.0 lb

## 2017-05-25 DIAGNOSIS — E785 Hyperlipidemia, unspecified: Secondary | ICD-10-CM

## 2017-05-25 DIAGNOSIS — I6930 Unspecified sequelae of cerebral infarction: Secondary | ICD-10-CM

## 2017-05-25 DIAGNOSIS — I442 Atrioventricular block, complete: Secondary | ICD-10-CM | POA: Diagnosis not present

## 2017-05-25 DIAGNOSIS — I480 Paroxysmal atrial fibrillation: Secondary | ICD-10-CM | POA: Diagnosis not present

## 2017-05-25 DIAGNOSIS — I6521 Occlusion and stenosis of right carotid artery: Secondary | ICD-10-CM

## 2017-05-25 DIAGNOSIS — I6990 Unspecified sequelae of unspecified cerebrovascular disease: Secondary | ICD-10-CM

## 2017-05-25 HISTORY — DX: Unspecified sequelae of unspecified cerebrovascular disease: I69.90

## 2017-05-25 HISTORY — DX: Unspecified sequelae of cerebral infarction: I69.30

## 2017-05-25 MED ORDER — ASPIRIN EC 81 MG PO TBEC: 81.0000 mg | DELAYED_RELEASE_TABLET | Freq: Every day | ORAL | Status: DC

## 2017-05-25 NOTE — Addendum Note (Signed)
Addended by: Aleatha Borer on: 05/25/2017 10:23 AM   Modules accepted: Orders

## 2017-05-25 NOTE — Progress Notes (Signed)
Cardiology Office Note:    Date:  05/25/2017   ID:  [name], DOB March 31, 1935, MRN [id number]  PCP:  Lillard Anes, MD  Cardiologist:  Jenne Campus, MD    Referring MD: Lillard Anes,*   Chief Complaint  Patient presents with  . Follow-up  Not doing well  History of Present Illness:    [name] is a 82 y.o. male with coronary artery disease, peripheral vascular disease, paroxysmal atrial fibrillation.  Last time I have seen he was at the beginning of September and finally had accepted offer of anticoagulation.  Anticoagulation was promptly started however at the end of this month he developed stroke he is right side became weak he was not able to walk well however he did not go to hospital eventually about a month later he did have a CAT scan of his head done which showed some left frontal lobe age undetermined stroke.  There was no bleeding.  He is doing poorly he said he does not have energy he does not want to do anything the strength to his right arm and right leg improved so he is able to walk however he is basically sits all day in the chair does not do much.  Denies having any chest pain tightness squeezing pressure burning chest however a few weeks ago he ended up going to the hospital because of abdominal discomfort.  Then he was sent home with no final diagnosis.   Past Medical History:  Diagnosis Date  . Asthma   . Peripheral vascular disease [facility])     Past Surgical History:  Procedure Laterality Date  . CATARACT EXTRACTION    . HIP SURGERY    . INSERT / REPLACE / REMOVE PACEMAKER     [employer]  . LEFT HEART CATH AND CORONARY ANGIOGRAPHY N/A 02/08/2017   Procedure: LEFT HEART CATH AND CORONARY ANGIOGRAPHY;  Surgeon: Martinique, Peter M, MD;  Location: [place] [facility];  Service: Cardiovascular;  Laterality: N/A;    Current Medications: Current Meds  Medication Sig  . albuterol (PROVENTIL HFA;VENTOLIN HFA) 108 (90 Base) MCG/ACT inhaler  Inhale 2 puffs into the lungs 4 (four) times daily as needed for wheezing or shortness of breath.  Marland Kitchen amLODipine (NORVASC) 5 MG tablet TAKE 1 TABLET(5 MG) BY MOUTH DAILY  . apixaban (ELIQUIS) 5 MG TABS tablet Take 1 tablet (5 mg total) by mouth 2 (two) times daily.  Marland Kitchen atorvastatin (LIPITOR) 40 MG tablet Take 40 mg by mouth daily.  . Calcium Carbonate-Vitamin D (CALCIUM 500 + D PO) Take 1 tablet by mouth daily.  . chlorthalidone (HYGROTON) 25 MG tablet Take 1 tablet (25 mg total) by mouth daily.  . fluticasone (FLOVENT HFA) 110 MCG/ACT inhaler Inhale 2 puffs into the lungs 2 (two) times daily.  . folic acid (FOLVITE) 235 MCG tablet Take 400 mcg by mouth daily.  . isosorbide mononitrate (IMDUR) 60 MG 24 hr tablet TAKE 1 TABLET(60 MG) BY MOUTH DAILY  . levothyroxine (SYNTHROID, LEVOTHROID) 50 MCG tablet Take 50 mcg by mouth daily before breakfast.  . Multiple Vitamins-Minerals (MULTIVITAMIN PO) Take 1 tablet by mouth daily.  . nitroGLYCERIN (NITROSTAT) 0.4 MG SL tablet Place 1 tablet (0.4 mg total) under the tongue every 5 (five) minutes as needed for chest pain.  Marland Kitchen omeprazole (PRILOSEC OTC) 20 MG tablet Take 20 mg by mouth daily.  . ranolazine (RANEXA) 500 MG 12 hr tablet Take 500 mg by mouth 2 (two) times daily.  . vitamin  B-12 (CYANOCOBALAMIN) 250 MCG tablet Take 250 mcg by mouth daily.     Allergies:   Patient has no known allergies.   Social History   Socioeconomic History  . Marital status: Married    Spouse name: None  . Number of children: None  . Years of education: None  . Highest education level: None  Social Needs  . Financial resource strain: None  . Food insecurity - worry: None  . Food insecurity - inability: None  . Transportation needs - medical: None  . Transportation needs - non-medical: None  Occupational History  . None  Tobacco Use  . Smoking status: Never Smoker  . Smokeless tobacco: Never Used  Substance and Sexual Activity  . Alcohol use: Yes  . Drug use:  No  . Sexual activity: None  Other Topics Concern  . None  Social History Narrative  . None     Family History: The patient's family history includes Heart attack in his mother. ROS:   Please see the history of present illness.    All 14 point review of systems negative except as described per history of present illness  EKGs/Labs/Other Studies Reviewed:      Recent Labs: 02/08/2017: ALT 19; BUN 17; Creatinine, Ser 1.28; Potassium 4.1; Sodium 136; TSH 3.081 02/22/2017: Hemoglobin 13.0; Platelets 279  Recent Lipid Panel    Component Value Date/Time   CHOL 172 02/08/2017 0204   TRIG 176 (H) 02/08/2017 0204   HDL 43 02/08/2017 0204   CHOLHDL 4.0 02/08/2017 0204   VLDL 35 02/08/2017 0204   LDLCALC 94 02/08/2017 0204    Physical Exam:    VS:  BP 140/64   Pulse 74   Ht 5\' 4"  (1.626 m)   Wt 192 lb (87.1 kg)   SpO2 91%   BMI 32.96 kg/m     Wt Readings from Last 3 Encounters:  05/25/17 192 lb (87.1 kg)  02/22/17 192 lb 12.8 oz (87.5 kg)  02/09/17 190 lb 7.6 oz (86.4 kg)     GEN:  Well nourished, well developed in no acute distress HEENT: Normal NECK: No JVD; No carotid bruits LYMPHATICS: No lymphadenopathy CARDIAC: RRR, no murmurs, no rubs, no gallops RESPIRATORY:  Clear to auscultation without rales, wheezing or rhonchi  ABDOMEN: Soft, non-tender, non-distended MUSCULOSKELETAL:  No edema; No deformity  SKIN: Warm and dry LOWER EXTREMITIES: no swelling NEUROLOGIC:  Alert and oriented x 3 PSYCHIATRIC:  Normal affect   ASSESSMENT:    1. Complete heart block (HCC)   2. Paroxysmal atrial fibrillation ([place])   3. Occlusion of right carotid artery   4. Dyslipidemia   5. Late effect of cerebrovascular accident (CVA)    PLAN:    In order of problems listed above:  1. Complete heart block: That is being addressed with the pacemaker.  I reviewed interrogation of his device from the beginning of this month and looks like functioning properly and battery status is  good 2. Paroxysmal atrial fibrillation: Anticoagulated with his high chads vas score.  But since he ended up having a stroke event while on anticoagulation I will ask him to add aspirin to his medical regiment only 81 mg. 3. Completely occluded righ carotid artery: I will ask him to have another carotid ultrasound to rule out any problem with the left artery his symptoms involves the right side of his body which will correlate to the left side of his brain therefore looking at the left carotid artery will be essential. 4. Dyslipidemia:  Continue with atorvastatin. 5. Late effect of CVA he is recovering from it.   Medication Adjustments/Labs and Tests Ordered: Current medicines are reviewed at length with the patient today.  Concerns regarding medicines are outlined above.  No orders of the defined types were placed in this encounter.  Medication changes: No orders of the defined types were placed in this encounter.   Signed, Park Liter, MD, [facility] 05/25/2017 10:12 AM    [place]

## 2017-05-25 NOTE — Patient Instructions (Signed)
Medication Instructions:  Your physician has recommended you make the following change in your medication:  1) Start taking Asprin 81 mg daily  Labwork: Your physician recommends that you have lab work today: BMP and CBC, we are checking your Kidney Function, electrolytes and blood count.  Testing/Procedures: Your physician has requested that you have a carotid duplex. This test is an ultrasound of the carotid arteries in your neck. It looks at blood flow through these arteries that supply the brain with blood. Allow one hour for this exam. There are no restrictions or special instructions.  Follow-Up: Your physician recommends that you schedule a follow-up appointment in: 1 month with Dr. Agustin Cree.  Any Other Special Instructions Will Be Listed Below (If Applicable).     If you need a refill on your cardiac medications before your next appointment, please call your pharmacy.

## 2017-05-26 LAB — BASIC METABOLIC PANEL
BUN/Creatinine Ratio: 13 (ref 10–24)
BUN: 15 mg/dL (ref 8–27)
CALCIUM: 9.5 mg/dL (ref 8.6–10.2)
CHLORIDE: 103 mmol/L (ref 96–106)
CO2: 25 mmol/L (ref 20–29)
Creatinine, Ser: 1.19 mg/dL (ref 0.76–1.27)
GFR, EST AFRICAN AMERICAN: 66 mL/min/{1.73_m2} (ref >59)
GFR, EST NON AFRICAN AMERICAN: 57 mL/min/{1.73_m2} — AB (ref >59)
Glucose: 101 mg/dL — ABNORMAL HIGH (ref 65–99)
POTASSIUM: 4.7 mmol/L (ref 3.5–5.2)
SODIUM: 140 mmol/L (ref 134–144)

## 2017-05-26 LAB — CBC
HEMOGLOBIN: 12.3 g/dL — AB (ref 13.0–17.7)
Hematocrit: 37.9 % (ref 37.5–51.0)
MCH: 29.4 pg (ref 26.6–33.0)
MCHC: 32.5 g/dL (ref 31.5–35.7)
MCV: 91 fL (ref 79–97)
PLATELETS: 264 10*3/uL (ref 150–379)
RBC: 4.19 x10E6/uL (ref 4.14–5.80)
RDW: 13.2 % (ref 12.3–15.4)
WBC: 6.7 10*3/uL (ref 3.4–10.8)

## 2017-05-27 ENCOUNTER — Ambulatory Visit (HOSPITAL_BASED_OUTPATIENT_CLINIC_OR_DEPARTMENT_OTHER)
Admission: RE | Admit: 2017-05-27 | Discharge: 2017-05-27 | Disposition: A | Discharge status: Home / Self Care | Payer: PPO | Source: Ambulatory Visit | Attending: Cardiology | Admitting: Cardiology

## 2017-05-27 DIAGNOSIS — E785 Hyperlipidemia, unspecified: Secondary | ICD-10-CM | POA: Diagnosis not present

## 2017-05-27 DIAGNOSIS — I6523 Occlusion and stenosis of bilateral carotid arteries: Secondary | ICD-10-CM | POA: Diagnosis not present

## 2017-05-27 DIAGNOSIS — I442 Atrioventricular block, complete: Secondary | ICD-10-CM | POA: Diagnosis not present

## 2017-05-27 DIAGNOSIS — I480 Paroxysmal atrial fibrillation: Secondary | ICD-10-CM | POA: Diagnosis not present

## 2017-05-27 DIAGNOSIS — I6521 Occlusion and stenosis of right carotid artery: Secondary | ICD-10-CM | POA: Diagnosis not present

## 2017-05-27 DIAGNOSIS — I6930 Unspecified sequelae of cerebral infarction: Secondary | ICD-10-CM | POA: Diagnosis not present

## 2017-05-27 LAB — VAS US CAROTID
LCCADDIAS: 22 cm/s
LEFT ECA DIAS: -7 cm/s
LEFT VERTEBRAL DIAS: 12 cm/s
LICADDIAS: -23 cm/s
LICADSYS: -72 cm/s
Left CCA dist sys: 111 cm/s
Left CCA prox dias: 20 cm/s
Left CCA prox sys: 92 cm/s
Left ICA prox dias: -34 cm/s
Left ICA prox sys: -110 cm/s
RCCAPDIAS: 3 cm/s
RIGHT ECA DIAS: -6 cm/s
Right CCA prox sys: 62 cm/s

## 2017-05-27 NOTE — Progress Notes (Addendum)
  Tech performed carotid duplex ultrasound. Right ICA occluded, Left ICA is 1-39% stenosed, moderate to severe bilateral heterogeneous plaque is seen throughout the CCA, bulb, and bifurcation.  Joelene Millin 05/27/2017, 12:24 PM

## 2017-06-07 ENCOUNTER — Other Ambulatory Visit: Payer: Self-pay

## 2017-06-07 MED ORDER — RANOLAZINE ER 500 MG PO TB12: 500.0000 mg | ORAL_TABLET | Freq: Two times a day (BID) | ORAL | 6 refills | Status: DC

## 2017-06-10 DIAGNOSIS — F322 Major depressive disorder, single episode, severe without psychotic features: Secondary | ICD-10-CM | POA: Diagnosis not present

## 2017-06-10 DIAGNOSIS — I10 Essential (primary) hypertension: Secondary | ICD-10-CM | POA: Diagnosis not present

## 2017-06-27 ENCOUNTER — Ambulatory Visit (INDEPENDENT_AMBULATORY_CARE_PROVIDER_SITE_OTHER): Payer: PPO | Admitting: Cardiology

## 2017-06-27 ENCOUNTER — Encounter: Payer: Self-pay | Admitting: Cardiology

## 2017-06-27 ENCOUNTER — Other Ambulatory Visit: Payer: Self-pay

## 2017-06-27 VITALS — BP 134/72 | HR 72 | Ht 64.0 in | Wt 191.0 lb

## 2017-06-27 DIAGNOSIS — E785 Hyperlipidemia, unspecified: Secondary | ICD-10-CM | POA: Diagnosis not present

## 2017-06-27 DIAGNOSIS — I480 Paroxysmal atrial fibrillation: Secondary | ICD-10-CM | POA: Diagnosis not present

## 2017-06-27 DIAGNOSIS — I739 Peripheral vascular disease, unspecified: Secondary | ICD-10-CM | POA: Diagnosis not present

## 2017-06-27 DIAGNOSIS — I442 Atrioventricular block, complete: Secondary | ICD-10-CM

## 2017-06-27 DIAGNOSIS — I6930 Unspecified sequelae of cerebral infarction: Secondary | ICD-10-CM | POA: Diagnosis not present

## 2017-06-27 NOTE — Progress Notes (Signed)
Cardiology Office Note:    Date:  06/27/2017   ID:  [name], DOB 05/21/1935, MRN [id number]  PCP:  Lillard Anes, MD  Cardiologist:  Jenne Campus, MD    Referring MD: Lillard Anes,*   No chief complaint on file. Doing better  History of Present Illness:    [name] is a 83 y.o. male with recent CVA.  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  No chest pain tightness squeezing pressure burning chest.  Past Medical History:  Diagnosis Date  . Asthma   . Peripheral vascular disease [facility])     Past Surgical History:  Procedure Laterality Date  . CATARACT EXTRACTION    . HIP SURGERY    . INSERT / REPLACE / REMOVE PACEMAKER     [employer]  . LEFT HEART CATH AND CORONARY ANGIOGRAPHY N/A 02/08/2017   Procedure: LEFT HEART CATH AND CORONARY ANGIOGRAPHY;  Surgeon: Martinique, Peter M, MD;  Location: [place] [facility];  Service: Cardiovascular;  Laterality: N/A;    Current Medications: Current Meds  Medication Sig  . albuterol (PROVENTIL HFA;VENTOLIN HFA) 108 (90 Base) MCG/ACT inhaler Inhale 2 puffs into the lungs 4 (four) times daily as needed for wheezing or shortness of breath.  Marland Kitchen amLODipine (NORVASC) 5 MG tablet TAKE 1 TABLET(5 MG) BY MOUTH DAILY  . apixaban (ELIQUIS) 5 MG TABS tablet Take 1 tablet (5 mg total) by mouth 2 (two) times daily.  Marland Kitchen aspirin EC 81 MG tablet Take 1 tablet (81 mg total) by mouth daily.  Marland Kitchen atorvastatin (LIPITOR) 40 MG tablet Take 40 mg by mouth daily.  . Calcium Carbonate-Vitamin D (CALCIUM 500 + D PO) Take 1 tablet by mouth daily.  . chlorthalidone (HYGROTON) 25 MG tablet Take 1 tablet (25 mg total) by mouth daily.  . fluticasone (FLOVENT HFA) 110 MCG/ACT inhaler Inhale 2 puffs into the lungs 2 (two) times daily.  . folic acid  (FOLVITE) 505 MCG tablet Take 400 mcg by mouth daily.  . isosorbide mononitrate (IMDUR) 60 MG 24 hr tablet TAKE 1 TABLET(60 MG) BY MOUTH DAILY  . levothyroxine (SYNTHROID, LEVOTHROID) 50 MCG tablet Take 50 mcg by mouth daily before breakfast.  . Multiple Vitamins-Minerals (MULTIVITAMIN PO) Take 1 tablet by mouth daily.  . nitroGLYCERIN (NITROSTAT) 0.4 MG SL tablet Place 1 tablet (0.4 mg total) under the tongue every 5 (five) minutes as needed for chest pain.  Marland Kitchen omeprazole (PRILOSEC OTC) 20 MG tablet Take 20 mg by mouth daily.  . ranolazine (RANEXA) 500 MG 12 hr tablet Take 1 tablet (500 mg total) by mouth 2 (two) times daily.  . vitamin B-12 (CYANOCOBALAMIN) 250 MCG tablet Take 250 mcg by mouth daily.     Allergies:   Patient has no known allergies.   Social History   Socioeconomic History  . Marital status: Married    Spouse name: Not on file  . Number of children: Not on file  . Years of education: Not on file  . Highest education level: Not on file  Social Needs  . Financial resource strain: Not on file  . Food insecurity - worry: Not on file  . Food insecurity - inability: Not on file  . Transportation needs - medical: Not on file  . Transportation needs - non-medical:  Not on file  Occupational History  . Not on file  Tobacco Use  . Smoking status: Never Smoker  . Smokeless tobacco: Never Used  Substance and Sexual Activity  . Alcohol use: Yes  . Drug use: No  . Sexual activity: Not on file  Other Topics Concern  . Not on file  Social History Narrative  . Not on file     Family History: The patient's family history includes Heart attack in his mother. ROS:   Please see the history of present illness.    All 14 point review of systems negative except as described per history of present illness  EKGs/Labs/Other Studies Reviewed:      Recent Labs: 02/08/2017: ALT 19; TSH 3.081 05/25/2017: BUN 15; Creatinine, Ser 1.19; Hemoglobin 12.3; Platelets 264; Potassium 4.7;  Sodium 140  Recent Lipid Panel    Component Value Date/Time   CHOL 172 02/08/2017 0204   TRIG 176 (H) 02/08/2017 0204   HDL 43 02/08/2017 0204   CHOLHDL 4.0 02/08/2017 0204   VLDL 35 02/08/2017 0204   LDLCALC 94 02/08/2017 0204    Physical Exam:    VS:  BP 134/72 (BP Location: Left Arm, Patient Position: Sitting, Cuff Size: Normal)   Pulse 72   Ht 5\' 4"  (1.626 m)   Wt 191 lb 0.6 oz (86.7 kg)   SpO2 98%   BMI 32.79 kg/m     Wt Readings from Last 3 Encounters:  06/27/17 191 lb 0.6 oz (86.7 kg)  05/25/17 192 lb (87.1 kg)  02/22/17 192 lb 12.8 oz (87.5 kg)     GEN:  Well nourished, well developed in no acute distress HEENT: Normal NECK: No JVD; No carotid bruits LYMPHATICS: No lymphadenopathy CARDIAC: RRR, no murmurs, no rubs, no gallops RESPIRATORY:  Clear to auscultation without rales, wheezing or rhonchi  ABDOMEN: Soft, non-tender, non-distended MUSCULOSKELETAL:  No edema; No deformity  SKIN: Warm and dry LOWER EXTREMITIES: no swelling NEUROLOGIC:  Alert and oriented x 3 PSYCHIATRIC:  Normal affect   ASSESSMENT:    1. Complete heart block (HCC)   2. Paroxysmal atrial fibrillation ([place])   3. Peripheral vascular disease ([place])   4. Dyslipidemia   5. Late effect of cerebrovascular accident (CVA)    PLAN:    In order of problems listed above:  1. Complete heart block: He does have a pacemaker will continue monitoring. 2. Proximal atrial fibrillation: Denies having any palpitations.  Anticoagulated which I will continue 3. Peripheral vascular disease: Recent peripheral vascular study reviewed no new finding completed of the right carotid artery. Dyslipidemia will call primary care physician to get fasting lipid profile Late effect of CVA stable actually improving.  While he seems to be doing better I will see him back in about 3 months   Medication Adjustments/Labs and Tests Ordered: Current medicines are reviewed at length with the patient today.  Concerns  regarding medicines are outlined above.  No orders of the defined types were placed in this encounter.  Medication changes: No orders of the defined types were placed in this encounter.   Signed, Park Liter, MD, [facility] 06/27/2017 10:34 AM    [place]

## 2017-06-27 NOTE — Patient Instructions (Signed)
Medication Instructions:  Your physician recommends that you continue on your current medications as directed. Please refer to the Current Medication list given to you today.  Labwork: None   Testing/Procedures: None   Follow-Up: Your physician recommends that you schedule a follow-up appointment in: 3 months   Any Other Special Instructions Will Be Listed Below (If Applicable).  Please note that any paperwork needing to be filled out by the provider will need to be addressed at the front desk prior to seeing the provider. Please note that any paperwork FMLA, Disability or other documents regarding health condition is subject to a $25.00 charge that must be received prior to completion of paperwork in the form of a money order or check.     If you need a refill on your cardiac medications before your next appointment, please call your pharmacy.  

## 2017-07-26 ENCOUNTER — Other Ambulatory Visit: Payer: Self-pay | Admitting: Cardiology

## 2017-07-28 DIAGNOSIS — I2510 Atherosclerotic heart disease of native coronary artery without angina pectoris: Secondary | ICD-10-CM | POA: Diagnosis not present

## 2017-07-28 DIAGNOSIS — E038 Other specified hypothyroidism: Secondary | ICD-10-CM | POA: Diagnosis not present

## 2017-07-28 DIAGNOSIS — J449 Chronic obstructive pulmonary disease, unspecified: Secondary | ICD-10-CM | POA: Diagnosis not present

## 2017-07-28 DIAGNOSIS — E782 Mixed hyperlipidemia: Secondary | ICD-10-CM | POA: Diagnosis not present

## 2017-07-28 DIAGNOSIS — C61 Malignant neoplasm of prostate: Secondary | ICD-10-CM | POA: Diagnosis not present

## 2017-07-28 DIAGNOSIS — F5102 Adjustment insomnia: Secondary | ICD-10-CM | POA: Diagnosis not present

## 2017-07-28 DIAGNOSIS — S01511A Laceration without foreign body of lip, initial encounter: Secondary | ICD-10-CM | POA: Diagnosis not present

## 2017-07-28 DIAGNOSIS — I69351 Hemiplegia and hemiparesis following cerebral infarction affecting right dominant side: Secondary | ICD-10-CM | POA: Diagnosis not present

## 2017-07-28 DIAGNOSIS — I10 Essential (primary) hypertension: Secondary | ICD-10-CM | POA: Diagnosis not present

## 2017-07-28 DIAGNOSIS — I359 Nonrheumatic aortic valve disorder, unspecified: Secondary | ICD-10-CM | POA: Diagnosis not present

## 2017-08-14 ENCOUNTER — Encounter (HOSPITAL_COMMUNITY): Payer: Self-pay

## 2017-08-14 ENCOUNTER — Other Ambulatory Visit: Payer: Self-pay

## 2017-08-14 ENCOUNTER — Inpatient Hospital Stay (HOSPITAL_COMMUNITY)
Admission: EM | Admit: 2017-08-14 | Discharge: 2017-08-20 | DRG: 481 | Disposition: A | Discharge status: Home Health | Payer: PPO | Attending: Internal Medicine | Admitting: Internal Medicine

## 2017-08-14 DIAGNOSIS — Z7901 Long term (current) use of anticoagulants: Secondary | ICD-10-CM

## 2017-08-14 DIAGNOSIS — M79604 Pain in right leg: Secondary | ICD-10-CM | POA: Diagnosis not present

## 2017-08-14 DIAGNOSIS — Z950 Presence of cardiac pacemaker: Secondary | ICD-10-CM | POA: Diagnosis not present

## 2017-08-14 DIAGNOSIS — S72301A Unspecified fracture of shaft of right femur, initial encounter for closed fracture: Principal | ICD-10-CM | POA: Diagnosis present

## 2017-08-14 DIAGNOSIS — S72001A Fracture of unspecified part of neck of right femur, initial encounter for closed fracture: Secondary | ICD-10-CM | POA: Diagnosis not present

## 2017-08-14 DIAGNOSIS — M9701XA Periprosthetic fracture around internal prosthetic right hip joint, initial encounter: Secondary | ICD-10-CM | POA: Diagnosis present

## 2017-08-14 DIAGNOSIS — I10 Essential (primary) hypertension: Secondary | ICD-10-CM | POA: Diagnosis present

## 2017-08-14 DIAGNOSIS — Z96641 Presence of right artificial hip joint: Secondary | ICD-10-CM

## 2017-08-14 DIAGNOSIS — Z7982 Long term (current) use of aspirin: Secondary | ICD-10-CM

## 2017-08-14 DIAGNOSIS — Z79890 Hormone replacement therapy (postmenopausal): Secondary | ICD-10-CM

## 2017-08-14 DIAGNOSIS — M978XXA Periprosthetic fracture around other internal prosthetic joint, initial encounter: Secondary | ICD-10-CM | POA: Diagnosis not present

## 2017-08-14 DIAGNOSIS — I442 Atrioventricular block, complete: Secondary | ICD-10-CM | POA: Diagnosis present

## 2017-08-14 DIAGNOSIS — K5900 Constipation, unspecified: Secondary | ICD-10-CM | POA: Diagnosis not present

## 2017-08-14 DIAGNOSIS — D62 Acute posthemorrhagic anemia: Secondary | ICD-10-CM | POA: Diagnosis not present

## 2017-08-14 DIAGNOSIS — N179 Acute kidney failure, unspecified: Secondary | ICD-10-CM | POA: Diagnosis present

## 2017-08-14 DIAGNOSIS — E039 Hypothyroidism, unspecified: Secondary | ICD-10-CM | POA: Diagnosis present

## 2017-08-14 DIAGNOSIS — R0789 Other chest pain: Secondary | ICD-10-CM | POA: Diagnosis not present

## 2017-08-14 DIAGNOSIS — S299XXA Unspecified injury of thorax, initial encounter: Secondary | ICD-10-CM | POA: Diagnosis not present

## 2017-08-14 DIAGNOSIS — S72091A Other fracture of head and neck of right femur, initial encounter for closed fracture: Secondary | ICD-10-CM | POA: Diagnosis not present

## 2017-08-14 DIAGNOSIS — I480 Paroxysmal atrial fibrillation: Secondary | ICD-10-CM | POA: Diagnosis present

## 2017-08-14 DIAGNOSIS — I2510 Atherosclerotic heart disease of native coronary artery without angina pectoris: Secondary | ICD-10-CM | POA: Diagnosis present

## 2017-08-14 DIAGNOSIS — S7291XA Unspecified fracture of right femur, initial encounter for closed fracture: Secondary | ICD-10-CM | POA: Diagnosis not present

## 2017-08-14 DIAGNOSIS — Y92009 Unspecified place in unspecified non-institutional (private) residence as the place of occurrence of the external cause: Secondary | ICD-10-CM | POA: Diagnosis not present

## 2017-08-14 DIAGNOSIS — Z96643 Presence of artificial hip joint, bilateral: Secondary | ICD-10-CM | POA: Diagnosis present

## 2017-08-14 DIAGNOSIS — Z7951 Long term (current) use of inhaled steroids: Secondary | ICD-10-CM | POA: Diagnosis not present

## 2017-08-14 DIAGNOSIS — W010XXA Fall on same level from slipping, tripping and stumbling without subsequent striking against object, initial encounter: Secondary | ICD-10-CM | POA: Diagnosis present

## 2017-08-14 DIAGNOSIS — S72009A Fracture of unspecified part of neck of unspecified femur, initial encounter for closed fracture: Secondary | ICD-10-CM | POA: Diagnosis present

## 2017-08-14 DIAGNOSIS — M25551 Pain in right hip: Secondary | ICD-10-CM | POA: Diagnosis present

## 2017-08-14 DIAGNOSIS — N183 Chronic kidney disease, stage 3 (moderate): Secondary | ICD-10-CM | POA: Diagnosis present

## 2017-08-14 DIAGNOSIS — E860 Dehydration: Secondary | ICD-10-CM | POA: Diagnosis present

## 2017-08-14 DIAGNOSIS — I739 Peripheral vascular disease, unspecified: Secondary | ICD-10-CM | POA: Diagnosis present

## 2017-08-14 DIAGNOSIS — I25110 Atherosclerotic heart disease of native coronary artery with unstable angina pectoris: Secondary | ICD-10-CM | POA: Diagnosis not present

## 2017-08-14 DIAGNOSIS — Z79899 Other long term (current) drug therapy: Secondary | ICD-10-CM

## 2017-08-14 DIAGNOSIS — I129 Hypertensive chronic kidney disease with stage 1 through stage 4 chronic kidney disease, or unspecified chronic kidney disease: Secondary | ICD-10-CM | POA: Diagnosis present

## 2017-08-14 DIAGNOSIS — S3993XA Unspecified injury of pelvis, initial encounter: Secondary | ICD-10-CM | POA: Diagnosis not present

## 2017-08-14 DIAGNOSIS — R52 Pain, unspecified: Secondary | ICD-10-CM

## 2017-08-14 DIAGNOSIS — Z8673 Personal history of transient ischemic attack (TIA), and cerebral infarction without residual deficits: Secondary | ICD-10-CM | POA: Diagnosis not present

## 2017-08-14 DIAGNOSIS — J45909 Unspecified asthma, uncomplicated: Secondary | ICD-10-CM | POA: Diagnosis present

## 2017-08-14 DIAGNOSIS — M25559 Pain in unspecified hip: Secondary | ICD-10-CM | POA: Diagnosis not present

## 2017-08-14 HISTORY — DX: Essential (primary) hypertension: I10

## 2017-08-14 MED ORDER — MORPHINE SULFATE (PF) 4 MG/ML IV SOLN
4.0000 mg | Freq: Once | INTRAVENOUS | Status: AC
  Administered 2017-08-15: 4 mg via INTRAVENOUS
  Filled 2017-08-14: qty 1

## 2017-08-14 NOTE — ED Triage Notes (Signed)
Per [facility], pt arrives from home where he bent over and had a fall onto the carpet. Pt denies hitting his head or losing consciousness. Pt reports 10/10 right hip pain. Pt has hx of r hip replacement. EMS reports some shortening to right leg. Pt is on Eliquis.

## 2017-08-15 ENCOUNTER — Emergency Department (HOSPITAL_COMMUNITY): Payer: PPO

## 2017-08-15 ENCOUNTER — Encounter (HOSPITAL_COMMUNITY): Payer: Self-pay | Admitting: Internal Medicine

## 2017-08-15 ENCOUNTER — Other Ambulatory Visit: Payer: Self-pay

## 2017-08-15 DIAGNOSIS — M25551 Pain in right hip: Secondary | ICD-10-CM | POA: Diagnosis present

## 2017-08-15 DIAGNOSIS — S72009A Fracture of unspecified part of neck of unspecified femur, initial encounter for closed fracture: Secondary | ICD-10-CM | POA: Diagnosis present

## 2017-08-15 DIAGNOSIS — S7291XA Unspecified fracture of right femur, initial encounter for closed fracture: Secondary | ICD-10-CM | POA: Diagnosis present

## 2017-08-15 DIAGNOSIS — I10 Essential (primary) hypertension: Secondary | ICD-10-CM | POA: Diagnosis present

## 2017-08-15 DIAGNOSIS — R52 Pain, unspecified: Secondary | ICD-10-CM | POA: Diagnosis not present

## 2017-08-15 DIAGNOSIS — D62 Acute posthemorrhagic anemia: Secondary | ICD-10-CM | POA: Diagnosis not present

## 2017-08-15 DIAGNOSIS — Z96641 Presence of right artificial hip joint: Secondary | ICD-10-CM | POA: Diagnosis not present

## 2017-08-15 DIAGNOSIS — I129 Hypertensive chronic kidney disease with stage 1 through stage 4 chronic kidney disease, or unspecified chronic kidney disease: Secondary | ICD-10-CM | POA: Diagnosis present

## 2017-08-15 DIAGNOSIS — Z7982 Long term (current) use of aspirin: Secondary | ICD-10-CM | POA: Diagnosis not present

## 2017-08-15 DIAGNOSIS — J45909 Unspecified asthma, uncomplicated: Secondary | ICD-10-CM | POA: Diagnosis present

## 2017-08-15 DIAGNOSIS — Z79899 Other long term (current) drug therapy: Secondary | ICD-10-CM | POA: Diagnosis not present

## 2017-08-15 DIAGNOSIS — S72001A Fracture of unspecified part of neck of right femur, initial encounter for closed fracture: Secondary | ICD-10-CM | POA: Diagnosis present

## 2017-08-15 DIAGNOSIS — Z79890 Hormone replacement therapy (postmenopausal): Secondary | ICD-10-CM | POA: Diagnosis not present

## 2017-08-15 DIAGNOSIS — S72301A Unspecified fracture of shaft of right femur, initial encounter for closed fracture: Secondary | ICD-10-CM | POA: Diagnosis present

## 2017-08-15 DIAGNOSIS — I442 Atrioventricular block, complete: Secondary | ICD-10-CM | POA: Diagnosis not present

## 2017-08-15 DIAGNOSIS — E860 Dehydration: Secondary | ICD-10-CM | POA: Diagnosis present

## 2017-08-15 DIAGNOSIS — Z8673 Personal history of transient ischemic attack (TIA), and cerebral infarction without residual deficits: Secondary | ICD-10-CM | POA: Diagnosis not present

## 2017-08-15 DIAGNOSIS — K5900 Constipation, unspecified: Secondary | ICD-10-CM | POA: Diagnosis not present

## 2017-08-15 DIAGNOSIS — Z950 Presence of cardiac pacemaker: Secondary | ICD-10-CM | POA: Diagnosis not present

## 2017-08-15 DIAGNOSIS — W010XXA Fall on same level from slipping, tripping and stumbling without subsequent striking against object, initial encounter: Secondary | ICD-10-CM | POA: Diagnosis present

## 2017-08-15 DIAGNOSIS — I2510 Atherosclerotic heart disease of native coronary artery without angina pectoris: Secondary | ICD-10-CM | POA: Diagnosis present

## 2017-08-15 DIAGNOSIS — R0789 Other chest pain: Secondary | ICD-10-CM | POA: Diagnosis not present

## 2017-08-15 DIAGNOSIS — Z96643 Presence of artificial hip joint, bilateral: Secondary | ICD-10-CM | POA: Diagnosis present

## 2017-08-15 DIAGNOSIS — N179 Acute kidney failure, unspecified: Secondary | ICD-10-CM | POA: Diagnosis present

## 2017-08-15 DIAGNOSIS — Y92009 Unspecified place in unspecified non-institutional (private) residence as the place of occurrence of the external cause: Secondary | ICD-10-CM | POA: Diagnosis not present

## 2017-08-15 DIAGNOSIS — N183 Chronic kidney disease, stage 3 (moderate): Secondary | ICD-10-CM | POA: Diagnosis present

## 2017-08-15 DIAGNOSIS — M9701XA Periprosthetic fracture around internal prosthetic right hip joint, initial encounter: Secondary | ICD-10-CM | POA: Diagnosis present

## 2017-08-15 DIAGNOSIS — I739 Peripheral vascular disease, unspecified: Secondary | ICD-10-CM | POA: Diagnosis present

## 2017-08-15 DIAGNOSIS — E039 Hypothyroidism, unspecified: Secondary | ICD-10-CM | POA: Diagnosis present

## 2017-08-15 DIAGNOSIS — Z7901 Long term (current) use of anticoagulants: Secondary | ICD-10-CM | POA: Diagnosis not present

## 2017-08-15 DIAGNOSIS — I480 Paroxysmal atrial fibrillation: Secondary | ICD-10-CM | POA: Diagnosis present

## 2017-08-15 DIAGNOSIS — Z7951 Long term (current) use of inhaled steroids: Secondary | ICD-10-CM | POA: Diagnosis not present

## 2017-08-15 HISTORY — DX: Essential (primary) hypertension: I10

## 2017-08-15 LAB — CBC WITH DIFFERENTIAL/PLATELET
BASOS ABS: 0 10*3/uL (ref 0.0–0.1)
Basophils Relative: 1 %
EOS PCT: 4 %
Eosinophils Absolute: 0.3 10*3/uL (ref 0.0–0.7)
HCT: 32.8 % — ABNORMAL LOW (ref 39.0–52.0)
Hemoglobin: 10.9 g/dL — ABNORMAL LOW (ref 13.0–17.0)
LYMPHS ABS: 1.6 10*3/uL (ref 0.7–4.0)
LYMPHS PCT: 19 %
MCH: 29.9 pg (ref 26.0–34.0)
MCHC: 33.2 g/dL (ref 30.0–36.0)
MCV: 90.1 fL (ref 78.0–100.0)
MONO ABS: 0.5 10*3/uL (ref 0.1–1.0)
MONOS PCT: 6 %
Neutro Abs: 6.1 10*3/uL (ref 1.7–7.7)
Neutrophils Relative %: 70 %
Platelets: 233 10*3/uL (ref 150–400)
RBC: 3.64 MIL/uL — ABNORMAL LOW (ref 4.22–5.81)
RDW: 13.1 % (ref 11.5–15.5)
WBC: 8.6 10*3/uL (ref 4.0–10.5)

## 2017-08-15 LAB — BASIC METABOLIC PANEL
ANION GAP: 10 (ref 5–15)
BUN: 22 mg/dL — AB (ref 6–20)
CHLORIDE: 103 mmol/L (ref 101–111)
CO2: 22 mmol/L (ref 22–32)
Calcium: 8.9 mg/dL (ref 8.9–10.3)
Creatinine, Ser: 1.82 mg/dL — ABNORMAL HIGH (ref 0.61–1.24)
GFR calc Af Amer: 39 mL/min — ABNORMAL LOW (ref >60)
GFR, EST NON AFRICAN AMERICAN: 33 mL/min — AB (ref >60)
GLUCOSE: 159 mg/dL — AB (ref 65–99)
POTASSIUM: 3.5 mmol/L (ref 3.5–5.1)
Sodium: 135 mmol/L (ref 135–145)

## 2017-08-15 LAB — PROTIME-INR
INR: 1.41
Prothrombin Time: 17.1 seconds — ABNORMAL HIGH (ref 11.4–15.2)

## 2017-08-15 MED ORDER — HYDROCODONE-ACETAMINOPHEN 5-325 MG PO TABS
1.0000 | ORAL_TABLET | Freq: Four times a day (QID) | ORAL | Status: DC | PRN
  Administered 2017-08-15 – 2017-08-17 (×7): 2 via ORAL
  Filled 2017-08-15 (×7): qty 2

## 2017-08-15 MED ORDER — PANTOPRAZOLE SODIUM 40 MG PO TBEC
40.0000 mg | DELAYED_RELEASE_TABLET | Freq: Every day | ORAL | Status: DC
  Administered 2017-08-15 – 2017-08-19 (×5): 40 mg via ORAL
  Filled 2017-08-15 (×6): qty 1

## 2017-08-15 MED ORDER — ISOSORBIDE MONONITRATE ER 60 MG PO TB24
60.0000 mg | ORAL_TABLET | Freq: Every day | ORAL | Status: DC
  Administered 2017-08-15 – 2017-08-19 (×5): 60 mg via ORAL
  Filled 2017-08-15 (×5): qty 1

## 2017-08-15 MED ORDER — FOLIC ACID 1 MG PO TABS
500.0000 ug | ORAL_TABLET | Freq: Every day | ORAL | Status: DC
  Administered 2017-08-15 – 2017-08-20 (×6): 0.5 mg via ORAL
  Filled 2017-08-15 (×6): qty 1

## 2017-08-15 MED ORDER — BUDESONIDE 0.5 MG/2ML IN SUSP
0.5000 mg | Freq: Two times a day (BID) | RESPIRATORY_TRACT | Status: DC
  Filled 2017-08-15 (×3): qty 2

## 2017-08-15 MED ORDER — RANOLAZINE ER 500 MG PO TB12
500.0000 mg | ORAL_TABLET | Freq: Two times a day (BID) | ORAL | Status: DC
  Administered 2017-08-15 – 2017-08-20 (×11): 500 mg via ORAL
  Filled 2017-08-15 (×11): qty 1

## 2017-08-15 MED ORDER — AMLODIPINE BESYLATE 5 MG PO TABS
5.0000 mg | ORAL_TABLET | Freq: Every day | ORAL | Status: DC
  Administered 2017-08-15 – 2017-08-20 (×6): 5 mg via ORAL
  Filled 2017-08-15 (×6): qty 1

## 2017-08-15 MED ORDER — HYDROMORPHONE HCL 1 MG/ML IJ SOLN
0.5000 mg | INTRAMUSCULAR | Status: DC | PRN
  Administered 2017-08-15 (×2): 1 mg via INTRAVENOUS
  Administered 2017-08-16: 04:00:00 2 mg via INTRAVENOUS
  Administered 2017-08-16 – 2017-08-17 (×4): 1 mg via INTRAVENOUS
  Filled 2017-08-15 (×3): qty 1
  Filled 2017-08-15: qty 2
  Filled 2017-08-15 (×4): qty 1

## 2017-08-15 MED ORDER — ATORVASTATIN CALCIUM 40 MG PO TABS
40.0000 mg | ORAL_TABLET | Freq: Every day | ORAL | Status: DC
  Administered 2017-08-15 – 2017-08-20 (×6): 40 mg via ORAL
  Filled 2017-08-15 (×6): qty 1

## 2017-08-15 MED ORDER — CYANOCOBALAMIN 250 MCG PO TABS
250.0000 ug | ORAL_TABLET | Freq: Every day | ORAL | Status: DC
  Administered 2017-08-15 – 2017-08-19 (×5): 250 ug via ORAL
  Filled 2017-08-15 (×6): qty 1

## 2017-08-15 MED ORDER — LEVOTHYROXINE SODIUM 50 MCG PO TABS
50.0000 ug | ORAL_TABLET | Freq: Every day | ORAL | Status: DC
  Administered 2017-08-15 – 2017-08-20 (×6): 50 ug via ORAL
  Filled 2017-08-15 (×7): qty 1

## 2017-08-15 MED ORDER — MORPHINE SULFATE (PF) 4 MG/ML IV SOLN
0.5000 mg | INTRAVENOUS | Status: DC | PRN
  Administered 2017-08-15 (×2): 0.52 mg via INTRAVENOUS
  Filled 2017-08-15 (×2): qty 1

## 2017-08-15 MED ORDER — NITROGLYCERIN 0.4 MG SL SUBL
0.4000 mg | SUBLINGUAL_TABLET | SUBLINGUAL | Status: DC | PRN
  Administered 2017-08-19 (×5): 0.4 mg via SUBLINGUAL
  Filled 2017-08-15 (×5): qty 1

## 2017-08-15 MED ORDER — SODIUM CHLORIDE 0.9 % IV SOLN
INTRAVENOUS | Status: DC
  Administered 2017-08-15 – 2017-08-17 (×3): via INTRAVENOUS

## 2017-08-15 MED ORDER — ALBUTEROL SULFATE (2.5 MG/3ML) 0.083% IN NEBU: 3.0000 mL | INHALATION_SOLUTION | Freq: Four times a day (QID) | RESPIRATORY_TRACT | Status: DC | PRN
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
## 2017-08-15 NOTE — Progress Notes (Signed)
Nutrition Brief Note  Patient identified on the Malnutrition Screening Tool (MST) Report  Wt Readings from Last 15 Encounters:  08/14/17 185 lb (83.9 kg)  06/27/17 191 lb 0.6 oz (86.7 kg)  05/25/17 192 lb (87.1 kg)  02/22/17 192 lb 12.8 oz (87.5 kg)  02/09/17 190 lb 7.6 oz (86.4 kg)    Body mass index is 30.79 kg/m. Patient meets criteria for obesity based on current BMI. Pt has lost 6 lbs (3% body weight) in the past 1.5 months. This is not significant for time frame. Pt with PMH of afib, complete heart block s/p pacemaker placement, CVA, hypothyroidism, HTN. Pt admitted s/p fall after slipping on a rug and experiencing associated R hip fx. Plan for surgery on hold; Dr. Moise Boring note from over night states "Patient has taken his apixaban a few hours ago. So patient cannot have the surgery for another 24 hours. So patient will be eating for now and will be on pain relief medications."   Current diet order is Heart Healthy. Labs and medications reviewed.   No nutrition interventions warranted at this time. If nutrition issues arise, please consult RD.      Jarome Matin, MS, RD, LDN, [facility] Inpatient Clinical Dietitian Pager # [phone] After hours/weekend pager # [phone]

## 2017-08-15 NOTE — Plan of Care (Signed)
  In a great deal of pain.   Bed rest ordered.  Awaiting surgery in 24 hours

## 2017-08-15 NOTE — ED Notes (Signed)
ED Provider at bedside. 

## 2017-08-15 NOTE — Progress Notes (Signed)
Consult-SNF  CSW aware of SNF consult. CSW will continue to follow and assist with any SNF discharge needs.   Kathrin Greathouse, Latanya Presser, MSW Clinical Social Worker  [phone] 08/15/2017  1:57 PM

## 2017-08-15 NOTE — ED Provider Notes (Signed)
[place] EMERGENCY DEPARTMENT Provider Note   CSN: [id number] Arrival date & time: 08/14/17  2331     History   Chief Complaint Chief Complaint  Patient presents with  . Fall    HPI TRACER GUTRIDGE is a 82 y.o. male.  Patient is an 83 year old male with past medical history of peripheral vascular disease, coronary artery disease, hypertension, paroxysmal A. fib presenting for evaluation of right hip pain.  He was walking this evening when he tripped, fell, and injured his right hip.  He reports 2 prior dislocations, but believes he broke at this time.  He denies any other injury in the fall.  He denies having struck his head or any loss of consciousness.   The history is provided by the patient.  Hip Pain  This is a new problem. The current episode started less than 1 hour ago. The problem occurs constantly. The problem has not changed since onset.Exacerbated by: movement and palpation. Nothing relieves the symptoms. He has tried nothing for the symptoms. The treatment provided no relief.    Past Medical History:  Diagnosis Date  . Asthma   . Hypertension   . Peripheral vascular disease [facility])     Patient Active Problem List   Diagnosis Date Noted  . Late effect of cerebrovascular accident (CVA) 05/25/2017  . Unstable angina ([place]) 02/08/2017  . Dyslipidemia 12/29/2016  . Right-sided carotid artery disease ([place]) 12/29/2016  . Peripheral vascular disease ([place]) 04/23/2015  . Precordial pain 04/23/2015  . Complete heart block ([place]) 04/01/2015  . Hypothyroidism 04/01/2015  . Pacemaker reprogramming/check 04/01/2015  . Paroxysmal atrial fibrillation ([place]) 04/01/2015    Past Surgical History:  Procedure Laterality Date  . CATARACT EXTRACTION    . HIP SURGERY    . INSERT / REPLACE / REMOVE PACEMAKER     [employer]  . LEFT HEART CATH AND CORONARY ANGIOGRAPHY N/A 02/08/2017   Procedure: LEFT HEART CATH AND CORONARY ANGIOGRAPHY;  Surgeon: Martinique, Peter  M, MD;  Location: [place] [facility];  Service: Cardiovascular;  Laterality: N/A;       Home Medications    Prior to Admission medications   Medication Sig Start Date End Date Taking? Authorizing Provider  albuterol (PROVENTIL HFA;VENTOLIN HFA) 108 (90 Base) MCG/ACT inhaler Inhale 2 puffs into the lungs 4 (four) times daily as needed for wheezing or shortness of breath.    [provider]  amLODipine (NORVASC) 5 MG tablet TAKE 1 TABLET(5 MG) BY MOUTH DAILY 07/26/17   Park Liter, MD  apixaban (ELIQUIS) 5 MG TABS tablet Take 1 tablet (5 mg total) by mouth 2 (two) times daily. 02/22/17   Park Liter, MD  aspirin EC 81 MG tablet Take 1 tablet (81 mg total) by mouth daily. 05/25/17   Park Liter, MD  atorvastatin (LIPITOR) 40 MG tablet Take 40 mg by mouth daily.    [provider]  Calcium Carbonate-Vitamin D (CALCIUM 500 + D PO) Take 1 tablet by mouth daily.    [provider]  chlorthalidone (HYGROTON) 25 MG tablet Take 1 tablet (25 mg total) by mouth daily. 02/09/17   Cheryln Manly, NP  fluticasone (FLOVENT HFA) 110 MCG/ACT inhaler Inhale 2 puffs into the lungs 2 (two) times daily.    [provider]  folic acid (FOLVITE) 654 MCG tablet Take 400 mcg by mouth daily.    [provider]  isosorbide mononitrate (IMDUR) 60 MG 24 hr tablet TAKE 1 TABLET(60 MG) BY  MOUTH DAILY 07/26/17   Park Liter, MD  levothyroxine (SYNTHROID, LEVOTHROID) 50 MCG tablet Take 50 mcg by mouth daily before breakfast.    [provider]  Multiple Vitamins-Minerals (MULTIVITAMIN PO) Take 1 tablet by mouth daily.    [provider]  nitroGLYCERIN (NITROSTAT) 0.4 MG SL tablet Place 1 tablet (0.4 mg total) under the tongue every 5 (five) minutes as needed for chest pain. 02/22/17   Park Liter, MD  omeprazole (PRILOSEC OTC) 20 MG tablet Take 20 mg by mouth daily.    [provider]  ranolazine (RANEXA) 500 MG 12 hr  tablet Take 1 tablet (500 mg total) by mouth 2 (two) times daily. 06/07/17   Park Liter, MD  vitamin B-12 (CYANOCOBALAMIN) 250 MCG tablet Take 250 mcg by mouth daily.    [provider]    Family History Family History  Problem Relation Age of Onset  . Heart attack Mother     Social History Social History   Tobacco Use  . Smoking status: Never Smoker  . Smokeless tobacco: Never Used  Substance Use Topics  . Alcohol use: Yes  . Drug use: No     Allergies   Patient has no known allergies.   Review of Systems Review of Systems  All other systems reviewed and are negative.    Physical Exam Updated Vital Signs BP (!) 159/69 (BP Location: Left Arm)   Pulse 73   Temp 98.4 F (36.9 C) (Oral)   Resp 16   Ht 5\' 5"  (1.651 m)   Wt 83.9 kg (185 lb)   SpO2 98%   BMI 30.79 kg/m   Physical Exam  Constitutional: He is oriented to person, place, and time. He appears well-developed and well-nourished. No distress.  HENT:  Head: Normocephalic and atraumatic.  Mouth/Throat: Oropharynx is clear and moist.  Neck: Normal range of motion. Neck supple.  Cardiovascular: Normal rate and regular rhythm. Exam reveals no friction rub.  No murmur heard. Pulmonary/Chest: Effort normal and breath sounds normal. No respiratory distress. He has no wheezes. He has no rales.  Abdominal: Soft. Bowel sounds are normal. He exhibits no distension. There is no tenderness.  Musculoskeletal: Normal range of motion. He exhibits no edema.  The right lower extremity is slightly shortened and externally rotated.  PMS is intact to the right foot.  He has tenderness to palpation over the right hip and pain with any movement of the leg.  Neurological: He is alert and oriented to person, place, and time. Coordination normal.  Skin: Skin is warm and dry. He is not diaphoretic.  Nursing note and vitals reviewed.    ED Treatments / Results  Labs (all labs ordered are listed, but only  abnormal results are displayed) Labs Reviewed  BASIC METABOLIC PANEL  CBC WITH DIFFERENTIAL/PLATELET  PROTIME-INR    EKG  EKG Interpretation None       Radiology No results found.  Procedures Procedures (including critical care time)  Medications Ordered in ED Medications  morphine 4 MG/ML injection 4 mg (4 mg Intravenous Given 08/15/17 0002)     Initial Impression / Assessment and Plan / ED Course  I have reviewed the triage vital signs and the nursing notes.  Pertinent labs & imaging results that were available during my care of the patient were reviewed by me and considered in my medical decision making (see chart for details).  X-rays show a proximal femur fracture around the hip prosthesis.  This was discussed  with Dr. Corine Shelter from orthopedics who would like the patient be admitted to medicine, preferably at [facility] where Dr. Alvan Dame operates.  I have spoken with Dr. Hal Hope who will admit.  Final Clinical Impressions(s) / ED Diagnoses   Final diagnoses:  None    ED Discharge Orders    None       Veryl Speak, MD 08/15/17 [phone]

## 2017-08-15 NOTE — ED Notes (Signed)
Per, Hal Hope, MD pt allowed to eat/drink tonight. Surgery will not be for 24 hours.

## 2017-08-15 NOTE — H&P (Signed)
History and Physical    [name] VQM:[id number] DOB: 30-Jun-1934 DOA: 08/14/2017  PCP: Lillard Anes, MD  Patient coming from: Home.  Chief Complaint: Fall.  HPI: [name] is a 83 y.o. male with history of paroxysmal atrial fibrillation, history of complete heart block status post pacemaker placement, history of CVA, hypothyroidism, hypertension was brought into the ER after patient had a fall at home.  Patient stated he slipped on a rug and fell.  Denies hitting his head or losing consciousness.  Denies any chest pain or shortness of breath or palpitations.  ED Course: The ER x-rays revealed right hip periprosthetic fracture.  Dr. Stann Mainland has been consulted and Dr. Alvan Dame will be seeing patient the next day for further recommendation on surgery.  Patient last took his apixaban tonight at around 8 PM.  Patient otherwise denies any chest pain or shortness of breath.  Review of Systems: As per HPI, rest all negative.   Past Medical History:  Diagnosis Date  . Asthma   . Hypertension   . Peripheral vascular disease [facility])     Past Surgical History:  Procedure Laterality Date  . CATARACT EXTRACTION    . HIP SURGERY    . INSERT / REPLACE / REMOVE PACEMAKER     [employer]  . LEFT HEART CATH AND CORONARY ANGIOGRAPHY N/A 02/08/2017   Procedure: LEFT HEART CATH AND CORONARY ANGIOGRAPHY;  Surgeon: Martinique, Peter M, MD;  Location: [place] CV [facility];  Service: Cardiovascular;  Laterality: N/A;     reports that  has never smoked. he has never used smokeless tobacco. He reports that he drinks alcohol. He reports that he does not use drugs.  No Known Allergies  Family History  Problem Relation Age of Onset  . Heart attack Mother     Prior to Admission medications   Medication Sig Start Date End Date Taking? Authorizing Provider  albuterol (PROVENTIL HFA;VENTOLIN HFA) 108 (90 Base) MCG/ACT inhaler Inhale 2 puffs into the lungs 4 (four) times daily as needed for  wheezing or shortness of breath.   Yes [provider]  amLODipine (NORVASC) 5 MG tablet TAKE 1 TABLET(5 MG) BY MOUTH DAILY 07/26/17  Yes Park Liter, MD  apixaban (ELIQUIS) 5 MG TABS tablet Take 1 tablet (5 mg total) by mouth 2 (two) times daily. 02/22/17  Yes Park Liter, MD  aspirin EC 325 MG tablet Take 325 mg by mouth daily.   Yes [provider]  atorvastatin (LIPITOR) 40 MG tablet Take 40 mg by mouth daily.   Yes [provider]  Calcium Carbonate-Vitamin D (CALCIUM 500 + D PO) Take 1 tablet by mouth daily.   Yes [provider]  chlorthalidone (HYGROTON) 25 MG tablet Take 1 tablet (25 mg total) by mouth daily. 02/09/17  Yes Cheryln Manly, NP  fluticasone (FLOVENT HFA) 110 MCG/ACT inhaler Inhale 2 puffs into the lungs 2 (two) times daily.   Yes [provider]  folic acid (FOLVITE) 932 MCG tablet Take 400 mcg by mouth daily.   Yes [provider]  isosorbide mononitrate (IMDUR) 60 MG 24 hr tablet TAKE 1 TABLET(60 MG) BY MOUTH DAILY 07/26/17  Yes Park Liter, MD  levothyroxine (SYNTHROID, LEVOTHROID) 50 MCG tablet Take 50 mcg by mouth daily before breakfast.   Yes [provider]  Multiple Vitamins-Minerals (MULTIVITAMIN PO) Take 1 tablet by mouth daily.   Yes [provider]  nitroGLYCERIN (NITROSTAT) 0.4 MG SL tablet Place 1 tablet (  0.4 mg total) under the tongue every 5 (five) minutes as needed for chest pain. 02/22/17  Yes Park Liter, MD  omeprazole (PRILOSEC OTC) 20 MG tablet Take 20 mg by mouth daily.   Yes [provider]  ranolazine (RANEXA) 500 MG 12 hr tablet Take 1 tablet (500 mg total) by mouth 2 (two) times daily. 06/07/17  Yes Park Liter, MD  vitamin B-12 (CYANOCOBALAMIN) 250 MCG tablet Take 250 mcg by mouth daily.   Yes [provider]  aspirin EC 81 MG tablet Take 1 tablet (81 mg total) by mouth daily. Patient not taking: Reported on 08/15/2017 05/25/17    Park Liter, MD    Physical Exam: Vitals:   08/15/17 0100 08/15/17 0130 08/15/17 0200 08/15/17 0215  BP: 137/62 (!) 143/61 (!) 157/68 129/69  Pulse:  70 69 71  Resp: 13 11 14 18   Temp:      TempSrc:      SpO2:  97% 96% 98%  Weight:      Height:          Constitutional: Moderately built and nourished. Vitals:   08/15/17 0100 08/15/17 0130 08/15/17 0200 08/15/17 0215  BP: 137/62 (!) 143/61 (!) 157/68 129/69  Pulse:  70 69 71  Resp: 13 11 14 18   Temp:      TempSrc:      SpO2:  97% 96% 98%  Weight:      Height:       Eyes: Anicteric no pallor. ENMT: No discharge from the ears eyes nose or mouth. Neck: No mass felt.  No JVD appreciated.  No neck rigidity. Respiratory: No rhonchi or crepitations. Cardiovascular: S1-S2 heard no murmurs appreciated. Abdomen: Soft nontender bowel sounds present. Musculoskeletal: Pain on moving right hip. Skin: No rash. Neurologic: Alert awake oriented to time place and person.  Moves all extremities. Psychiatric: Appears normal.  Normal affect.   Labs on Admission: I have personally reviewed following labs and imaging studies  CBC: Recent Labs  Lab 08/15/17 0013  WBC 8.6  NEUTROABS 6.1  HGB 10.9*  HCT 32.8*  MCV 90.1  PLT 854   Basic Metabolic Panel: Recent Labs  Lab 08/15/17 0013  NA 135  K 3.5  CL 103  CO2 22  GLUCOSE 159*  BUN 22*  CREATININE 1.82*  CALCIUM 8.9   GFR: Estimated Creatinine Clearance: 32.3 mL/min (A) (by C-G formula based on SCr of 1.82 mg/dL (H)). Liver Function Tests: No results for input(s): AST, ALT, ALKPHOS, BILITOT, PROT, ALBUMIN in the last 168 hours. No results for input(s): LIPASE, AMYLASE in the last 168 hours. No results for input(s): AMMONIA in the last 168 hours. Coagulation Profile: Recent Labs  Lab 08/15/17 0013  INR 1.41   Cardiac Enzymes: No results for input(s): CKTOTAL, CKMB, CKMBINDEX, TROPONINI in the last 168 hours. BNP (last 3 results) No results for input(s):  PROBNP in the last 8760 hours. HbA1C: No results for input(s): HGBA1C in the last 72 hours. CBG: No results for input(s): GLUCAP in the last 168 hours. Lipid Profile: No results for input(s): CHOL, HDL, LDLCALC, TRIG, CHOLHDL, LDLDIRECT in the last 72 hours. Thyroid Function Tests: No results for input(s): TSH, T4TOTAL, FREET4, T3FREE, THYROIDAB in the last 72 hours. Anemia Panel: No results for input(s): VITAMINB12, FOLATE, FERRITIN, TIBC, IRON, RETICCTPCT in the last 72 hours. Urine analysis:    Component Value Date/Time   COLORURINE AMBER BIOCHEMICALS MAY BE AFFECTED BY COLOR (A) 07/05/2008 0314   APPEARANCEUR CLOUDY (  A) 07/05/2008 0314   LABSPEC 1.018 07/05/2008 0314   PHURINE 6.5 07/05/2008 0314   GLUCOSEU NEGATIVE 07/05/2008 0314   HGBUR LARGE (A) 07/05/2008 0314   BILIRUBINUR SMALL (A) 07/05/2008 0314   KETONESUR 15 (A) 07/05/2008 0314   PROTEINUR 100 (A) 07/05/2008 0314   UROBILINOGEN 1.0 07/05/2008 0314   NITRITE NEGATIVE 07/05/2008 0314   LEUKOCYTESUR NEGATIVE 07/05/2008 0314   Sepsis Labs: @LABRCNTIP (procalcitonin:4,lacticidven:4) )No results found for this or any previous visit (from the past 240 hour(s)).   Radiological Exams on Admission: Dg Chest 1 View  Result Date: 08/15/2017 CLINICAL DATA:  Fall, right leg pain EXAM: CHEST 1 VIEW COMPARISON:  02/07/2017 FINDINGS: Left pacer remains in place, unchanged. Cardiomegaly. Aortic atherosclerosis. No confluent airspace opacities, effusions or edema. No pneumothorax. No acute bony abnormality. IMPRESSION: Cardiomegaly.  No active disease. Electronically Signed   By: Rolm Baptise M.D.   On: 08/15/2017 01:00   Dg Pelvis 1-2 Views  Result Date: 08/15/2017 CLINICAL DATA:  Fall.  Right leg pain. EXAM: PELVIS - 1-2 VIEW COMPARISON:  Right femur series performed today FINDINGS: Bilateral replacements. The proximal femoral shaft fracture seen on the femoral series cannot be visualized on this pelvis image. No acute bony  abnormality seen. No subluxation or dislocation. IMPRESSION: Bilateral hip replacements. No acute bony abnormality. See right femur series for further findings. Electronically Signed   By: Rolm Baptise M.D.   On: 08/15/2017 01:00   Dg Femur Min 2 Views Right  Result Date: 08/15/2017 CLINICAL DATA:  Fall, right leg pain EXAM: RIGHT FEMUR 2 VIEWS COMPARISON:  None. FINDINGS: Changes of prior right hip replacement. There is a fracture noted through the proximal femoral shaft about the femoral component. Mildly displaced fracture fragments. No additional acute bony abnormality. IMPRESSION: Proximal right femoral shaft fracture around the shaft component of the right hip replacement. Electronically Signed   By: Rolm Baptise M.D.   On: 08/15/2017 01:01    EKG: Independently reviewed.  Normal sinus rhythm with LBBB.  Patient has previous history of LBBB.  Assessment/Plan Principal Problem:   Hip fracture requiring operative repair, right, closed, initial encounter [facility]) Active Problems:   Hypothyroidism   Paroxysmal atrial fibrillation ([place])   Essential hypertension   Hip fracture ([place])    1. Right hip periprosthetic fracture status post mechanical fall -patient denies any chest pain or shortness of breath.  Has had cardiac cath in August 2018 which showed nonobstructive CAD.  Patient is at moderate risk for intermediate risk procedure.  Patient has had taken his apixaban few hours ago at 8 PM.  So patient cannot have the surgery for another 24 hours.  So patient will be eating for now and will be on pain relief medications. 2. Paroxysmal atrial fibrillation -holding apixaban in anticipation of possible surgery.  Patient is not on any rate limiting medications. 3. History of complete heart block status post pacemaker placement. 4. Hypertension on amlodipine. 5. History of CVA on statins and apixaban. 6. Hypothyroidism on Synthroid. 7. Acute renal failure -probably secondary to dehydration we will  gently hydrate closely follow metabolic panel. 8. Normocytic normochromic anemia -hemoglobin has further decreased from compared to December 2018.  Closely follow CBC.   DVT prophylaxis: SCDs. Code Status: Full code. Family Communication: Patient's wife. Disposition Plan: May need rehab. Consults called: Orthopedics. Admission status: Inpatient.   Rise Patience MD [facility] Pager [phone].  If 7PM-7AM, please contact night-coverage [email or web address] Password [facility]  08/15/2017, 2:41 AM

## 2017-08-15 NOTE — ED Notes (Signed)
Patient transported to X-ray 

## 2017-08-15 NOTE — Progress Notes (Signed)
PROGRESS NOTE    Patient: [name]     PCP: Lillard Anes, MD                    DOB: May 27, 1935            DOA: 08/14/2017 RDE:[id number]             DOS: 08/15/2017, 11:08 AM   LOS: 0 days   Date of Service: The patient was seen and examined on 08/15/2017  Subjective:  Patient was seen and examined.  Stable, no acute distress.  Only right hip pain with intention of movement. Apixaban has been on hold anticipating ORIF around 8 PM today.    ----------------------------------------------------------------------------------------------------------------------  Brief Narrative:   83 year old male with a history of paroxysmal atrial fibrillation, complete heart block, status post pacemaker placement, history of CVA, hypothyroidism, hypertension, on chronic anticoagulation with Eliquis presented status post accidental fall as he slipped on a rug. Initial evaluation revealed right hip.  He prostatic fracture.  Orthopedic team Dr. Lois Huxley Dr. Alvan Dame been consulted.     Principal Problem:   Hip fracture requiring operative repair, right, closed, initial encounter [facility]) Active Problems:   Hypothyroidism   Paroxysmal atrial fibrillation [facility])   Essential hypertension   Hip fracture [facility])   Assessment & Plan:   Right hip periprosthetic fracture status post mechanical fall  - Has had cardiac cath in August 2018 which showed nonobstructive CAD.  Patient is at moderate risk for intermediate risk procedure. - Patient has had taken his apixaban few hours ago at 8 PM.   -NPO now  - on pain relief medications. Paroxysmal atrial fibrillation -holding apixaban in anticipation of possible surgery.  Patient is not on any rate limiting medications.  History of complete heart block status post pacemaker placement.  Hypertension on amlodipine.  History of CVA on statins and apixaban.  Hypothyroidism on Synthroid.  Acute renal failure -probably secondary to dehydration we  will gently hydrate closely follow metabolic panel.  Normocytic normochromic anemia -hemoglobin has further decreased from compared to December 2018.  Closely follow CBC.   DVT prophylaxis: SCDs. Code Status: Full code. Family Communication: Patient's wife. Disposition Plan: May need rehab. Consults called: Orthopedics. Admission status: Inpatient.  Procedures:    Pending ORIF   Antimicrobials:  Anti-infectives (From admission, onward)   None       Objective: Vitals:   08/15/17 0215 08/15/17 0245 08/15/17 0352 08/15/17 1004  BP: 129/69 (!) 133/54 114/67 (!) 159/70  Pulse: 71 70 71   Resp: 18 11 15    Temp:   98.7 F (37.1 C)   TempSrc:   Oral   SpO2: 98% 96% 98%   Weight:      Height:        Intake/Output Summary (Last 24 hours) at 08/15/2017 1108 Last data filed at 08/15/2017 0352 Gross per 24 hour  Intake 80 ml  Output -  Net 80 ml   Filed Weights   08/14/17 2341  Weight: 83.9 kg (185 lb)    Examination:  General exam: Appears calm and comfortable  Psychiatry: Judgement and insight appear normal. Mood & affect appropriate. HEENT: WNLs Respiratory system: Clear to auscultation. Respiratory effort normal. Cardiovascular system: S1 & S2 heard, RRR. No JVD, murmurs, rubs, gallops or clicks. No pedal edema. Gastrointestinal system: Abd. nondistended, soft and nontender. No organomegaly or masses felt. Normal bowel sounds heard. Central nervous system: Alert and oriented. No focal neurological deficits. Extremities: Symmetric 5 x  5 power.,  Right hip pain, limited range of movement secondary to pain Skin: No rashes, lesions or ulcers   Data Reviewed: I have personally reviewed following labs and imaging studies  CBC: Recent Labs  Lab 08/15/17 0013  WBC 8.6  NEUTROABS 6.1  HGB 10.9*  HCT 32.8*  MCV 90.1  PLT 563   Basic Metabolic Panel: Recent Labs  Lab 08/15/17 0013  NA 135  K 3.5  CL 103  CO2 22  GLUCOSE 159*  BUN 22*  CREATININE 1.82*   CALCIUM 8.9   GFR: Estimated Creatinine Clearance: 32.3 mL/min (A) (by C-G formula based on SCr of 1.82 mg/dL (H)). Liver Function Tests: No results for input(s): AST, ALT, ALKPHOS, BILITOT, PROT, ALBUMIN in the last 168 hours. No results for input(s): LIPASE, AMYLASE in the last 168 hours. No results for input(s): AMMONIA in the last 168 hours. Coagulation Profile: Recent Labs  Lab 08/15/17 0013  INR 1.41   Cardiac Enzymes: No results for input(s): CKTOTAL, CKMB, CKMBINDEX, TROPONINI in the last 168 hours. BNP (last 3 results) No results for input(s): PROBNP in the last 8760 hours. HbA1C: No results for input(s): HGBA1C in the last 72 hours. CBG: No results for input(s): GLUCAP in the last 168 hours. Lipid Profile: No results for input(s): CHOL, HDL, LDLCALC, TRIG, CHOLHDL, LDLDIRECT in the last 72 hours. Thyroid Function Tests: No results for input(s): TSH, T4TOTAL, FREET4, T3FREE, THYROIDAB in the last 72 hours. Anemia Panel: No results for input(s): VITAMINB12, FOLATE, FERRITIN, TIBC, IRON, RETICCTPCT in the last 72 hours. Sepsis Labs: No results for input(s): PROCALCITON, LATICACIDVEN in the last 168 hours.  No results found for this or any previous visit (from the past 240 hour(s)).    Radiology Studies: Dg Chest 1 View  Result Date: 08/15/2017 CLINICAL DATA:  Fall, right leg pain EXAM: CHEST 1 VIEW COMPARISON:  02/07/2017 FINDINGS: Left pacer remains in place, unchanged. Cardiomegaly. Aortic atherosclerosis. No confluent airspace opacities, effusions or edema. No pneumothorax. No acute bony abnormality. IMPRESSION: Cardiomegaly.  No active disease. Electronically Signed   By: Rolm Baptise M.D.   On: 08/15/2017 01:00   Dg Pelvis 1-2 Views  Result Date: 08/15/2017 CLINICAL DATA:  Fall.  Right leg pain. EXAM: PELVIS - 1-2 VIEW COMPARISON:  Right femur series performed today FINDINGS: Bilateral replacements. The proximal femoral shaft fracture seen on the femoral series  cannot be visualized on this pelvis image. No acute bony abnormality seen. No subluxation or dislocation. IMPRESSION: Bilateral hip replacements. No acute bony abnormality. See right femur series for further findings. Electronically Signed   By: Rolm Baptise M.D.   On: 08/15/2017 01:00   Dg Femur Min 2 Views Right  Result Date: 08/15/2017 CLINICAL DATA:  Fall, right leg pain EXAM: RIGHT FEMUR 2 VIEWS COMPARISON:  None. FINDINGS: Changes of prior right hip replacement. There is a fracture noted through the proximal femoral shaft about the femoral component. Mildly displaced fracture fragments. No additional acute bony abnormality. IMPRESSION: Proximal right femoral shaft fracture around the shaft component of the right hip replacement. Electronically Signed   By: Rolm Baptise M.D.   On: 08/15/2017 01:01    Scheduled Meds: . amLODipine  5 mg Oral Daily  . atorvastatin  40 mg Oral Daily  . budesonide  0.5 mg Inhalation BID  . folic acid  875 mcg Oral Daily  . isosorbide mononitrate  60 mg Oral Daily  . levothyroxine  50 mcg Oral QAC breakfast  . pantoprazole  40  mg Oral Daily  . ranolazine  500 mg Oral BID  . vitamin B-12  250 mcg Oral Daily   Continuous Infusions: . sodium chloride      Time spent: >25 minutes  Deatra James, MD [facility],  Pager [phone]  If 7PM-7AM, please contact night-coverage [email or web address]   Password [email or web address]  08/15/2017, 11:08 AM

## 2017-08-16 DIAGNOSIS — N179 Acute kidney failure, unspecified: Secondary | ICD-10-CM

## 2017-08-16 DIAGNOSIS — Z7901 Long term (current) use of anticoagulants: Secondary | ICD-10-CM

## 2017-08-16 LAB — BASIC METABOLIC PANEL
Anion gap: 9 (ref 5–15)
BUN: 24 mg/dL — AB (ref 6–20)
CHLORIDE: 101 mmol/L (ref 101–111)
CO2: 24 mmol/L (ref 22–32)
CREATININE: 1.81 mg/dL — AB (ref 0.61–1.24)
Calcium: 8.8 mg/dL — ABNORMAL LOW (ref 8.9–10.3)
GFR calc Af Amer: 39 mL/min — ABNORMAL LOW (ref >60)
GFR calc non Af Amer: 34 mL/min — ABNORMAL LOW (ref >60)
Glucose, Bld: 106 mg/dL — ABNORMAL HIGH (ref 65–99)
Potassium: 4.3 mmol/L (ref 3.5–5.1)
Sodium: 134 mmol/L — ABNORMAL LOW (ref 135–145)

## 2017-08-16 LAB — CBC
HEMATOCRIT: 31 % — AB (ref 39.0–52.0)
HEMOGLOBIN: 10.1 g/dL — AB (ref 13.0–17.0)
MCH: 29.6 pg (ref 26.0–34.0)
MCHC: 32.6 g/dL (ref 30.0–36.0)
MCV: 90.9 fL (ref 78.0–100.0)
Platelets: 236 10*3/uL (ref 150–400)
RBC: 3.41 MIL/uL — ABNORMAL LOW (ref 4.22–5.81)
RDW: 12.8 % (ref 11.5–15.5)
WBC: 7.3 10*3/uL (ref 4.0–10.5)

## 2017-08-16 MED ORDER — CHLORHEXIDINE GLUCONATE 4 % EX LIQD
60.0000 mL | Freq: Once | CUTANEOUS | Status: AC
  Administered 2017-08-17: 4 via TOPICAL

## 2017-08-16 MED ORDER — SODIUM CHLORIDE 0.9 % IV SOLN
INTRAVENOUS | Status: DC
  Administered 2017-08-16: 15:00:00 via INTRAVENOUS

## 2017-08-16 MED ORDER — CEFAZOLIN SODIUM-DEXTROSE 2-4 GM/100ML-% IV SOLN
2.0000 g | INTRAVENOUS | Status: AC
  Administered 2017-08-17: 2 g via INTRAVENOUS
  Filled 2017-08-16: qty 100

## 2017-08-16 MED ORDER — POVIDONE-IODINE 10 % EX SWAB
2.0000 "application " | Freq: Once | CUTANEOUS | Status: AC
  Administered 2017-08-17: 2 via TOPICAL

## 2017-08-16 NOTE — Progress Notes (Signed)
Patient ID: [name], male   DOB: [birth year], 83 y.o.   MRN: [id number] Subjective:  doing ok given predicament Ready to get hip fixed though    Patient reports pain as moderate to severe with movement  Objective:   VITALS:   Vitals:   08/16/17 0603 08/16/17 0953  BP: (!) 168/67 (!) 165/56  Pulse: 88 86  Resp: 15 16  Temp: 98.3 F (36.8 C) 99.1 F (37.3 C)  SpO2: 90% 94%    Neurologically intact  RLE externally rotated  LABS Recent Labs    08/15/17 0013 08/16/17 0551  HGB 10.9* 10.1*  HCT 32.8* 31.0*  WBC 8.6 7.3  PLT 233 236    Recent Labs    08/15/17 0013 08/16/17 0551  NA 135 134*  K 3.5 4.3  BUN 22* 24*  CREATININE 1.82* 1.81*  GLUCOSE 159* 106*    Recent Labs    08/15/17 0013  INR 1.41     Assessment/Plan:   Right closed peri-prosthetic proximal femur fracture with stable femoral component   Plan: NPO after midnight To OR tomorrow for ORIF right periprosthetic femur fracture with stable prothesis Off ELiquis >48 hrs by that time Consent ordered

## 2017-08-16 NOTE — Progress Notes (Addendum)
Nurse tech went into room because patient was coughing. Nurse tech sat patients bed up because patient was coughing. Patient had been laying down trying to eat breakfast. Nurse offered to sit patient up to eat breakfast right after breakfast arrived. Patient refused. Nurse entered room, pt reports "choking" on egg. Patients breathing is labored, as patient is clearing throat. Nurse assessed patients lung sounds. Lungs are clear with expiratory wheezes in all fields. Patient reports feeling okay at this time and breathing is slowed to unlabored breaths before nurse left room. Patient called nurse into room again, needed basin to spit in. Patient reports spitting up the rest of the egg. Patient is not coughing at this time and is resting. Nurse will make provider aware of choking episode. Nurse reassessed lungs. Lung sounds are clear and patient reports feeling good.

## 2017-08-16 NOTE — Evaluation (Signed)
Clinical/Bedside Swallow Evaluation Patient Details  Name: [name] MRN: [id number] Date of Birth: Feb 13, 1935  Today's Date: 08/16/2017 Time: SLP Start Time (ACUTE ONLY): 8185 SLP Stop Time (ACUTE ONLY): 1648 SLP Time Calculation (min) (ACUTE ONLY): 13 min  Past Medical History:  Past Medical History:  Diagnosis Date  . Asthma   . Hypertension   . Peripheral vascular disease [facility])    Past Surgical History:  Past Surgical History:  Procedure Laterality Date  . CATARACT EXTRACTION    . HIP SURGERY    . INSERT / REPLACE / REMOVE PACEMAKER     [employer]  . LEFT HEART CATH AND CORONARY ANGIOGRAPHY N/A 02/08/2017   Procedure: LEFT HEART CATH AND CORONARY ANGIOGRAPHY;  Surgeon: Martinique, Peter M, MD;  Location: [place] [facility];  Service: Cardiovascular;  Laterality: N/A;   HPI:  83 year old male with history of paroxysmal atrial fibrillation on chronic anticoagulation with Eliquis, complete heart block status post pacemaker placement, history of CVA, hypothyroidism, hypertension presented after an accidental fall and was found to have right periprosthetic femur fracture.  SLP swallow eval ordered due to choking incident pt had this am on a small piece of scrambled egg, per pt's report.  He is emphatic that his swallowing is fine.    Assessment / Plan / Recommendation Clinical Impression  Pt presents with a normal oropharyngeal swallow with adequate mastication, brisk swallow response, and no overt s/s of aspiration.  He denies any swallowing problems and cites this morning's egg incident as unusual.  Continue current diet; no dysphagia.  SLP will sign off.  SLP Visit Diagnosis: Dysphagia, unspecified (R13.10)    Aspiration Risk  No limitations    Diet Recommendation   regular diet, thin liquids  Medication Administration: Whole meds with liquid    Other  Recommendations Oral Care Recommendations: Oral care BID   Follow up Recommendations        Frequency and Duration            Prognosis        Swallow Study   General Date of Onset: 08/14/17 HPI: 83 year old male with history of paroxysmal atrial fibrillation on chronic anticoagulation with Eliquis, complete heart block status post pacemaker placement, history of CVA, hypothyroidism, hypertension presented after an accidental fall and was found to have right periprosthetic femur fracture.  SLP swallow eval ordered due to choking incident pt has this am on a small piece of scrambled egg, per pt's report.  He is emphatic that his swallowing is fine.  Type of Study: Bedside Swallow Evaluation Previous Swallow Assessment: no Diet Prior to this Study: Regular;Thin liquids Temperature Spikes Noted: Yes Respiratory Status: Room air History of Recent Intubation: No Behavior/Cognition: Alert;Cooperative;Pleasant mood Oral Cavity Assessment: Within Functional Limits Oral Care Completed by SLP: No Oral Cavity - Dentition: Adequate natural dentition Vision: Functional for self-feeding Self-Feeding Abilities: Able to feed self Patient Positioning: Upright in bed Baseline Vocal Quality: Hoarse Volitional Cough: Strong Volitional Swallow: Able to elicit    Oral/Motor/Sensory Function Overall Oral Motor/Sensory Function: Within functional limits   Ice Chips Ice chips: Within functional limits   Thin Liquid Thin Liquid: Within functional limits Presentation: Straw;Self Fed    Nectar Thick Nectar Thick Liquid: Not tested   Honey Thick Honey Thick Liquid: Not tested   Puree Puree: Within functional limits Presentation: Self Fed   Solid   GO   Solid: Within functional limits Presentation: Self Fed        [name] 08/16/2017,4:51  PM

## 2017-08-16 NOTE — Progress Notes (Signed)
Patient ID: [name], male   DOB: 15-Sep-1934, 83 y.o.   MRN: [id number]  PROGRESS NOTE    SEBASTHIAN STAILEY  OEV:[id number] DOB: February 24, 1935 DOA: 08/14/2017 PCP: Lillard Anes, MD   Brief Narrative:  83 year old male with history of paroxysmal atrial fibrillation on chronic anticoagulation with Eliquis, complete heart block status post pacemaker placement, history of CVA, hypothyroidism, hypertension presented after an accidental fall and was found to have right periprosthetic femur fracture.  Orthopedics was consulted.  Assessment & Plan:   Principal Problem:   Hip fracture requiring operative repair, right, closed, initial encounter [facility]) Active Problems:   Hypothyroidism   Paroxysmal atrial fibrillation (HCC)   Essential hypertension   Hip fracture (HCC)   Right hip periprosthetic fracture status post mechanical fall -Has had cardiac cath in August 2018 which showed nonobstructive CAD. Patient is at moderate risk for intermediate risk procedure. -Orthopedics following.  Timing of surgery is being decided by orthopedics as patient took Eliquis evening prior to presentation.  Eliquis has been held since admission. -Continue pain management.  Fall precautions  Paroxysmal atrial fibrillation-holding apixaban in anticipation of possible surgery. Patient is not on any rate limiting medications.  Rate controlled.  History of complete heart block status post pacemaker placement. -Outpatient follow-up with cardiology/EP  Hypertension: Blood pressure on the higher side.  Continue amlodipine and isosorbide mononitrate.  History of CVA: Continue statin.  Apixaban on hold.  Hypothyroidism on Synthroid.  Acute renal failure-probably secondary to dehydration.  Continue hydration.  Repeat a.m. Labs  Normocytic normochromic anemia-hemoglobin stable.  Follow CBC    DVT prophylaxis: SCDs Code Status:   Full Family Communication: None at bedside Disposition Plan:  Depends on clinical outcome  Consultants: Orthopedics  Procedures: None  Antimicrobials: None   Subjective: Patient seen and examined at bedside.  He denies overnight fever, nausea or vomiting.  Complains of right hip pain.  Objective: Vitals:   08/15/17 2106 08/15/17 2352 08/16/17 0603 08/16/17 0953  BP: (!) 179/76 (!) 144/62 (!) 168/67 (!) 165/56  Pulse: 83 87 88 86  Resp: 15 16 15 16   Temp: 99.1 F (37.3 C) 100.2 F (37.9 C) 98.3 F (36.8 C) 99.1 F (37.3 C)  TempSrc: Oral Oral Oral Oral  SpO2: 94% 94% 90% 94%  Weight:      Height:        Intake/Output Summary (Last 24 hours) at 08/16/2017 1042 Last data filed at 08/16/2017 0953 Gross per 24 hour  Intake 2557.5 ml  Output 950 ml  Net 1607.5 ml   Filed Weights   08/14/17 2341  Weight: 83.9 kg (185 lb)    Examination:  General exam: Appears calm and comfortable  Respiratory system: Bilateral decreased breath sounds at bases Cardiovascular system: S1 & S2 heard, rate controlled.  Gastrointestinal system: Abdomen is nondistended, soft and nontender. Normal bowel sounds heard. Extremities: No cyanosis, clubbing, edema    Data Reviewed: I have personally reviewed following labs and imaging studies  CBC: Recent Labs  Lab 08/15/17 0013 08/16/17 0551  WBC 8.6 7.3  NEUTROABS 6.1  --   HGB 10.9* 10.1*  HCT 32.8* 31.0*  MCV 90.1 90.9  PLT 233 829   Basic Metabolic Panel: Recent Labs  Lab 08/15/17 0013 08/16/17 0551  NA 135 134*  K 3.5 4.3  CL 103 101  CO2 22 24  GLUCOSE 159* 106*  BUN 22* 24*  CREATININE 1.82* 1.81*  CALCIUM 8.9 8.8*   GFR: Estimated Creatinine Clearance: 32.5 mL/min (A) (  by C-G formula based on SCr of 1.81 mg/dL (H)). Liver Function Tests: No results for input(s): AST, ALT, ALKPHOS, BILITOT, PROT, ALBUMIN in the last 168 hours. No results for input(s): LIPASE, AMYLASE in the last 168 hours. No results for input(s): AMMONIA in the last 168 hours. Coagulation Profile: Recent  Labs  Lab 08/15/17 0013  INR 1.41   Cardiac Enzymes: No results for input(s): CKTOTAL, CKMB, CKMBINDEX, TROPONINI in the last 168 hours. BNP (last 3 results) No results for input(s): PROBNP in the last 8760 hours. HbA1C: No results for input(s): HGBA1C in the last 72 hours. CBG: No results for input(s): GLUCAP in the last 168 hours. Lipid Profile: No results for input(s): CHOL, HDL, LDLCALC, TRIG, CHOLHDL, LDLDIRECT in the last 72 hours. Thyroid Function Tests: No results for input(s): TSH, T4TOTAL, FREET4, T3FREE, THYROIDAB in the last 72 hours. Anemia Panel: No results for input(s): VITAMINB12, FOLATE, FERRITIN, TIBC, IRON, RETICCTPCT in the last 72 hours. Sepsis Labs: No results for input(s): PROCALCITON, LATICACIDVEN in the last 168 hours.  No results found for this or any previous visit (from the past 240 hour(s)).       Radiology Studies: Dg Chest 1 View  Result Date: 08/15/2017 CLINICAL DATA:  Fall, right leg pain EXAM: CHEST 1 VIEW COMPARISON:  02/07/2017 FINDINGS: Left pacer remains in place, unchanged. Cardiomegaly. Aortic atherosclerosis. No confluent airspace opacities, effusions or edema. No pneumothorax. No acute bony abnormality. IMPRESSION: Cardiomegaly.  No active disease. Electronically Signed   By: Rolm Baptise M.D.   On: 08/15/2017 01:00   Dg Pelvis 1-2 Views  Result Date: 08/15/2017 CLINICAL DATA:  Fall.  Right leg pain. EXAM: PELVIS - 1-2 VIEW COMPARISON:  Right femur series performed today FINDINGS: Bilateral replacements. The proximal femoral shaft fracture seen on the femoral series cannot be visualized on this pelvis image. No acute bony abnormality seen. No subluxation or dislocation. IMPRESSION: Bilateral hip replacements. No acute bony abnormality. See right femur series for further findings. Electronically Signed   By: Rolm Baptise M.D.   On: 08/15/2017 01:00   Dg Femur Min 2 Views Right  Result Date: 08/15/2017 CLINICAL DATA:  Fall, right leg pain  EXAM: RIGHT FEMUR 2 VIEWS COMPARISON:  None. FINDINGS: Changes of prior right hip replacement. There is a fracture noted through the proximal femoral shaft about the femoral component. Mildly displaced fracture fragments. No additional acute bony abnormality. IMPRESSION: Proximal right femoral shaft fracture around the shaft component of the right hip replacement. Electronically Signed   By: Rolm Baptise M.D.   On: 08/15/2017 01:01        Scheduled Meds: . amLODipine  5 mg Oral Daily  . atorvastatin  40 mg Oral Daily  . budesonide  0.5 mg Inhalation BID  . folic acid  454 mcg Oral Daily  . isosorbide mononitrate  60 mg Oral Daily  . levothyroxine  50 mcg Oral QAC breakfast  . pantoprazole  40 mg Oral Daily  . ranolazine  500 mg Oral BID  . vitamin B-12  250 mcg Oral Daily   Continuous Infusions: . sodium chloride 75 mL/hr at 08/15/17 2327     LOS: 1 day        Aline August, MD [facility] Pager [phone]  If 7PM-7AM, please contact night-coverage [email or web address] Password [facility] 08/16/2017, 10:42 AM

## 2017-08-17 ENCOUNTER — Inpatient Hospital Stay (HOSPITAL_COMMUNITY): Payer: PPO | Admitting: Anesthesiology

## 2017-08-17 ENCOUNTER — Encounter (HOSPITAL_COMMUNITY): Payer: Self-pay | Admitting: Anesthesiology

## 2017-08-17 ENCOUNTER — Encounter (HOSPITAL_COMMUNITY)
Admission: EM | Disposition: A | Discharge status: Home Health | Payer: Self-pay | Source: Home / Self Care | Attending: Nephrology

## 2017-08-17 DIAGNOSIS — R52 Pain, unspecified: Secondary | ICD-10-CM

## 2017-08-17 HISTORY — PX: ORIF PERIPROSTHETIC FRACTURE: SHX5034

## 2017-08-17 LAB — CBC
HEMATOCRIT: 26.5 % — AB (ref 39.0–52.0)
HEMOGLOBIN: 9.1 g/dL — AB (ref 13.0–17.0)
MCH: 31.3 pg (ref 26.0–34.0)
MCHC: 34.3 g/dL (ref 30.0–36.0)
MCV: 91.1 fL (ref 78.0–100.0)
Platelets: 189 10*3/uL (ref 150–400)
RBC: 2.91 MIL/uL — AB (ref 4.22–5.81)
RDW: 12.7 % (ref 11.5–15.5)
WBC: 6.3 10*3/uL (ref 4.0–10.5)

## 2017-08-17 LAB — BASIC METABOLIC PANEL
Anion gap: 7 (ref 5–15)
BUN: 18 mg/dL (ref 6–20)
CHLORIDE: 102 mmol/L (ref 101–111)
CO2: 25 mmol/L (ref 22–32)
Calcium: 8.5 mg/dL — ABNORMAL LOW (ref 8.9–10.3)
Creatinine, Ser: 1.34 mg/dL — ABNORMAL HIGH (ref 0.61–1.24)
GFR calc non Af Amer: 48 mL/min — ABNORMAL LOW (ref >60)
GFR, EST AFRICAN AMERICAN: 56 mL/min — AB (ref >60)
Glucose, Bld: 104 mg/dL — ABNORMAL HIGH (ref 65–99)
Potassium: 3.9 mmol/L (ref 3.5–5.1)
Sodium: 134 mmol/L — ABNORMAL LOW (ref 135–145)

## 2017-08-17 LAB — TYPE AND SCREEN
ABO/RH(D): O NEG
Antibody Screen: NEGATIVE

## 2017-08-17 LAB — ABO/RH: ABO/RH(D): O NEG

## 2017-08-17 LAB — SURGICAL PCR SCREEN
MRSA, PCR: NEGATIVE
STAPHYLOCOCCUS AUREUS: NEGATIVE

## 2017-08-17 LAB — MAGNESIUM: MAGNESIUM: 1.4 mg/dL — AB (ref 1.7–2.4)

## 2017-08-17 SURGERY — OPEN REDUCTION INTERNAL FIXATION (ORIF) PERIPROSTHETIC FRACTURE
Anesthesia: General | Laterality: Right

## 2017-08-17 MED ORDER — PROPOFOL 10 MG/ML IV BOLUS
INTRAVENOUS | Status: DC | PRN
  Administered 2017-08-17: 110 mg via INTRAVENOUS

## 2017-08-17 MED ORDER — METHOCARBAMOL 1000 MG/10ML IJ SOLN
500.0000 mg | Freq: Four times a day (QID) | INTRAVENOUS | Status: DC | PRN
  Filled 2017-08-17: qty 5

## 2017-08-17 MED ORDER — EPHEDRINE 5 MG/ML INJ
INTRAVENOUS | Status: AC
  Filled 2017-08-17: qty 10

## 2017-08-17 MED ORDER — PHENYLEPHRINE 40 MCG/ML (10ML) SYRINGE FOR IV PUSH (FOR BLOOD PRESSURE SUPPORT)
PREFILLED_SYRINGE | INTRAVENOUS | Status: DC | PRN
  Administered 2017-08-17 (×3): 80 ug via INTRAVENOUS

## 2017-08-17 MED ORDER — METHOCARBAMOL 500 MG PO TABS
500.0000 mg | ORAL_TABLET | Freq: Four times a day (QID) | ORAL | Status: DC | PRN
  Administered 2017-08-17 – 2017-08-19 (×3): 500 mg via ORAL
  Filled 2017-08-17 (×3): qty 1

## 2017-08-17 MED ORDER — METOCLOPRAMIDE HCL 5 MG/ML IJ SOLN: 5.0000 mg | Freq: Three times a day (TID) | INTRAMUSCULAR | Status: DC | PRN

## 2017-08-17 MED ORDER — HYDROCODONE-ACETAMINOPHEN 5-325 MG PO TABS: 1.0000 | ORAL_TABLET | ORAL | 0 refills | Status: DC | PRN

## 2017-08-17 MED ORDER — MENTHOL 3 MG MT LOZG
1.0000 | LOZENGE | OROMUCOSAL | Status: DC | PRN
  Filled 2017-08-17: qty 9

## 2017-08-17 MED ORDER — LIDOCAINE 2% (20 MG/ML) 5 ML SYRINGE
INTRAMUSCULAR | Status: AC
  Filled 2017-08-17: qty 10

## 2017-08-17 MED ORDER — PHENYLEPHRINE 40 MCG/ML (10ML) SYRINGE FOR IV PUSH (FOR BLOOD PRESSURE SUPPORT)
PREFILLED_SYRINGE | INTRAVENOUS | Status: AC
  Filled 2017-08-17: qty 10

## 2017-08-17 MED ORDER — MAGNESIUM CITRATE PO SOLN: 1.0000 | Freq: Once | ORAL | Status: DC | PRN

## 2017-08-17 MED ORDER — HYDROCODONE-ACETAMINOPHEN 5-325 MG PO TABS
1.0000 | ORAL_TABLET | ORAL | Status: DC | PRN
  Administered 2017-08-17: 22:00:00 2 via ORAL
  Administered 2017-08-18 – 2017-08-20 (×9): 1 via ORAL
  Filled 2017-08-17 (×4): qty 1
  Filled 2017-08-17: qty 2
  Filled 2017-08-17 (×3): qty 1
  Filled 2017-08-17: qty 2
  Filled 2017-08-17: qty 1

## 2017-08-17 MED ORDER — ALUM & MAG HYDROXIDE-SIMETH 200-200-20 MG/5ML PO SUSP
30.0000 mL | ORAL | Status: DC | PRN
  Administered 2017-08-19 (×2): 30 mL via ORAL
  Filled 2017-08-17 (×2): qty 30

## 2017-08-17 MED ORDER — DOCUSATE SODIUM 100 MG PO CAPS
100.0000 mg | ORAL_CAPSULE | Freq: Two times a day (BID) | ORAL | Status: DC
  Administered 2017-08-17 – 2017-08-20 (×6): 100 mg via ORAL
  Filled 2017-08-17 (×6): qty 1

## 2017-08-17 MED ORDER — MEPERIDINE HCL 50 MG/ML IJ SOLN: 6.2500 mg | INTRAMUSCULAR | Status: DC | PRN

## 2017-08-17 MED ORDER — ONDANSETRON HCL 4 MG/2ML IJ SOLN: 4.0000 mg | Freq: Four times a day (QID) | INTRAMUSCULAR | Status: DC | PRN

## 2017-08-17 MED ORDER — ROCURONIUM BROMIDE 10 MG/ML (PF) SYRINGE
PREFILLED_SYRINGE | INTRAVENOUS | Status: DC | PRN
  Administered 2017-08-17: 60 mg via INTRAVENOUS

## 2017-08-17 MED ORDER — FERROUS SULFATE 325 (65 FE) MG PO TABS
325.0000 mg | ORAL_TABLET | Freq: Three times a day (TID) | ORAL | Status: DC
  Administered 2017-08-18 – 2017-08-20 (×6): 325 mg via ORAL
  Filled 2017-08-17 (×6): qty 1

## 2017-08-17 MED ORDER — ONDANSETRON HCL 4 MG PO TABS: 4.0000 mg | ORAL_TABLET | Freq: Four times a day (QID) | ORAL | Status: DC | PRN

## 2017-08-17 MED ORDER — KETOROLAC TROMETHAMINE 15 MG/ML IJ SOLN
INTRAMUSCULAR | Status: AC
  Filled 2017-08-17: qty 1

## 2017-08-17 MED ORDER — SUCCINYLCHOLINE CHLORIDE 200 MG/10ML IV SOSY
PREFILLED_SYRINGE | INTRAVENOUS | Status: AC
  Filled 2017-08-17: qty 10

## 2017-08-17 MED ORDER — HYDROMORPHONE HCL 1 MG/ML IJ SOLN: 0.2500 mg | INTRAMUSCULAR | Status: DC | PRN

## 2017-08-17 MED ORDER — PHENOL 1.4 % MT LIQD
1.0000 | OROMUCOSAL | Status: DC | PRN
  Filled 2017-08-17: qty 177

## 2017-08-17 MED ORDER — SUGAMMADEX SODIUM 200 MG/2ML IV SOLN
INTRAVENOUS | Status: AC
  Filled 2017-08-17: qty 2

## 2017-08-17 MED ORDER — METHOCARBAMOL 500 MG PO TABS: 500.0000 mg | ORAL_TABLET | Freq: Four times a day (QID) | ORAL | 0 refills | Status: DC | PRN

## 2017-08-17 MED ORDER — ROCURONIUM BROMIDE 10 MG/ML (PF) SYRINGE
PREFILLED_SYRINGE | INTRAVENOUS | Status: AC
Start: 2017-08-17 — End: 2017-08-17
  Filled 2017-08-17: qty 10

## 2017-08-17 MED ORDER — HYDROMORPHONE HCL 1 MG/ML IJ SOLN
INTRAMUSCULAR | Status: AC
  Filled 2017-08-17: qty 1

## 2017-08-17 MED ORDER — ONDANSETRON HCL 4 MG/2ML IJ SOLN
INTRAMUSCULAR | Status: DC | PRN
  Administered 2017-08-17: 4 mg via INTRAVENOUS

## 2017-08-17 MED ORDER — BISACODYL 10 MG RE SUPP: 10.0000 mg | Freq: Every day | RECTAL | Status: DC | PRN

## 2017-08-17 MED ORDER — LIDOCAINE 2% (20 MG/ML) 5 ML SYRINGE
INTRAMUSCULAR | Status: DC | PRN
  Administered 2017-08-17: 100 mg via INTRAVENOUS

## 2017-08-17 MED ORDER — SUGAMMADEX SODIUM 200 MG/2ML IV SOLN
INTRAVENOUS | Status: DC | PRN
  Administered 2017-08-17: 200 mg via INTRAVENOUS

## 2017-08-17 MED ORDER — DEXAMETHASONE SODIUM PHOSPHATE 10 MG/ML IJ SOLN
INTRAMUSCULAR | Status: DC | PRN
  Administered 2017-08-17: 10 mg via INTRAVENOUS

## 2017-08-17 MED ORDER — METOCLOPRAMIDE HCL 5 MG PO TABS: 5.0000 mg | ORAL_TABLET | Freq: Three times a day (TID) | ORAL | Status: DC | PRN

## 2017-08-17 MED ORDER — POLYETHYLENE GLYCOL 3350 17 G PO PACK: 17.0000 g | PACK | Freq: Every day | ORAL | Status: DC | PRN

## 2017-08-17 MED ORDER — FENTANYL CITRATE (PF) 100 MCG/2ML IJ SOLN
INTRAMUSCULAR | Status: AC
  Filled 2017-08-17: qty 2

## 2017-08-17 MED ORDER — APIXABAN 2.5 MG PO TABS
2.5000 mg | ORAL_TABLET | Freq: Two times a day (BID) | ORAL | Status: DC
  Administered 2017-08-18 – 2017-08-20 (×5): 2.5 mg via ORAL
  Filled 2017-08-17 (×5): qty 1

## 2017-08-17 MED ORDER — LACTATED RINGERS IV SOLN
INTRAVENOUS | Status: DC | PRN
  Administered 2017-08-17: 19:00:00 via INTRAVENOUS
  Administered 2017-08-17: 1000 mL via INTRAVENOUS
  Administered 2017-08-17: 14:00:00 via INTRAVENOUS

## 2017-08-17 MED ORDER — KETOROLAC TROMETHAMINE 15 MG/ML IJ SOLN
15.0000 mg | Freq: Once | INTRAMUSCULAR | Status: AC
  Administered 2017-08-17: 15 mg via INTRAVENOUS

## 2017-08-17 MED ORDER — CEFAZOLIN SODIUM-DEXTROSE 2-4 GM/100ML-% IV SOLN
2.0000 g | Freq: Four times a day (QID) | INTRAVENOUS | Status: AC
  Administered 2017-08-18 (×2): 2 g via INTRAVENOUS
  Filled 2017-08-17 (×2): qty 100

## 2017-08-17 MED ORDER — FENTANYL CITRATE (PF) 100 MCG/2ML IJ SOLN
INTRAMUSCULAR | Status: DC | PRN
  Administered 2017-08-17: 75 ug via INTRAVENOUS
  Administered 2017-08-17: 25 ug via INTRAVENOUS

## 2017-08-17 MED ORDER — PROMETHAZINE HCL 25 MG/ML IJ SOLN: 6.2500 mg | INTRAMUSCULAR | Status: DC | PRN

## 2017-08-17 SURGICAL SUPPLY — 60 items
BAG ZIPLOCK 12X15 (MISCELLANEOUS) IMPLANT
BLADE SAW SGTL 11.0X1.19X90.0M (BLADE) IMPLANT
CABLE CERLAGE W/CRIMP 1.8 (Cable) ×6 IMPLANT
CABLE CERLAGE W/CRIMP 1.8MM (Cable) ×3 IMPLANT
CLOSURE WOUND 1/2 X4 (GAUZE/BANDAGES/DRESSINGS)
COVER SURGICAL LIGHT HANDLE (MISCELLANEOUS) ×3 IMPLANT
DERMABOND ADVANCED (GAUZE/BANDAGES/DRESSINGS) ×2
DERMABOND ADVANCED .7 DNX12 (GAUZE/BANDAGES/DRESSINGS) ×1 IMPLANT
DRAPE INCISE IOBAN 66X45 STRL (DRAPES) ×3 IMPLANT
DRAPE ORTHO SPLIT 77X108 STRL (DRAPES) ×4
DRAPE POUCH INSTRU U-SHP 10X18 (DRAPES) ×3 IMPLANT
DRAPE SURG 17X11 SM STRL (DRAPES) ×3 IMPLANT
DRAPE SURG ORHT 6 SPLT 77X108 (DRAPES) ×2 IMPLANT
DRAPE U-SHAPE 47X51 STRL (DRAPES) ×3 IMPLANT
DRSG AQUACEL AG ADV 3.5X14 (GAUZE/BANDAGES/DRESSINGS) ×3 IMPLANT
DRSG EMULSION OIL 3X16 NADH (GAUZE/BANDAGES/DRESSINGS) IMPLANT
DRSG PAD ABDOMINAL 8X10 ST (GAUZE/BANDAGES/DRESSINGS) IMPLANT
DURAPREP 26ML APPLICATOR (WOUND CARE) ×3 IMPLANT
ELECT BLADE TIP CTD 4 INCH (ELECTRODE) ×3 IMPLANT
ELECT REM PT RETURN 15FT ADLT (MISCELLANEOUS) ×3 IMPLANT
EVACUATOR 1/8 PVC DRAIN (DRAIN) IMPLANT
FACESHIELD WRAPAROUND (MASK) ×9 IMPLANT
GAUZE SPONGE 4X4 12PLY STRL (GAUZE/BANDAGES/DRESSINGS) IMPLANT
GLOVE BIOGEL M 7.0 STRL (GLOVE) IMPLANT
GLOVE BIOGEL PI IND STRL 7.5 (GLOVE) ×1 IMPLANT
GLOVE BIOGEL PI IND STRL 8.5 (GLOVE) ×1 IMPLANT
GLOVE BIOGEL PI INDICATOR 7.5 (GLOVE) ×2
GLOVE BIOGEL PI INDICATOR 8.5 (GLOVE) ×2
GLOVE ECLIPSE 8.0 STRL XLNG CF (GLOVE) IMPLANT
GLOVE ORTHO TXT STRL SZ7.5 (GLOVE) ×3 IMPLANT
GLOVE SURG ORTHO 8.0 STRL STRW (GLOVE) ×3 IMPLANT
GOWN STRL REUS W/TWL LRG LVL3 (GOWN DISPOSABLE) ×12 IMPLANT
GOWN STRL REUS W/TWL XL LVL3 (GOWN DISPOSABLE) IMPLANT
IMMOBILIZER KNEE 20 (SOFTGOODS)
IMMOBILIZER KNEE 20 THIGH 36 (SOFTGOODS) IMPLANT
KIT BASIN OR (CUSTOM PROCEDURE TRAY) ×3 IMPLANT
MANIFOLD NEPTUNE II (INSTRUMENTS) ×3 IMPLANT
NS IRRIG 1000ML POUR BTL (IV SOLUTION) ×3 IMPLANT
PACK TOTAL JOINT (CUSTOM PROCEDURE TRAY) ×3 IMPLANT
PASSER SUT SWANSON 36MM LOOP (INSTRUMENTS) IMPLANT
POSITIONER SURGICAL ARM (MISCELLANEOUS) ×3 IMPLANT
SPONGE LAP 18X18 X RAY DECT (DISPOSABLE) IMPLANT
SPONGE LAP 4X18 X RAY DECT (DISPOSABLE) IMPLANT
STAPLER SKIN PROX WIDE 3.9 (STAPLE) IMPLANT
STAPLER VISISTAT 35W (STAPLE) IMPLANT
STRIP CLOSURE SKIN 1/2X4 (GAUZE/BANDAGES/DRESSINGS) IMPLANT
SUCTION FRAZIER HANDLE 10FR (MISCELLANEOUS)
SUCTION TUBE FRAZIER 10FR DISP (MISCELLANEOUS) IMPLANT
SUT ETHIBOND NAB CT1 #1 30IN (SUTURE) IMPLANT
SUT MNCRL AB 3-0 PS2 18 (SUTURE) ×3 IMPLANT
SUT MNCRL AB 4-0 PS2 18 (SUTURE) IMPLANT
SUT MON AB 3-0 SH 27 (SUTURE) ×2
SUT MON AB 3-0 SH27 (SUTURE) ×1 IMPLANT
SUT STRATAFIX 1PDS 45CM VIOLET (SUTURE) ×3 IMPLANT
SUT VIC AB 1 CT1 36 (SUTURE) ×3 IMPLANT
SUT VIC AB 2-0 CT1 27 (SUTURE) ×4
SUT VIC AB 2-0 CT1 TAPERPNT 27 (SUTURE) ×2 IMPLANT
TOWEL OR 17X26 10 PK STRL BLUE (TOWEL DISPOSABLE) ×6 IMPLANT
TRAY FOLEY W/METER SILVER 16FR (SET/KITS/TRAYS/PACK) IMPLANT
WATER STERILE IRR 1000ML POUR (IV SOLUTION) IMPLANT

## 2017-08-17 NOTE — Progress Notes (Signed)
PROGRESS NOTE    Patient: [name]     PCP: Lillard Anes, MD                    DOB: May 25, 1935            DOA: 08/14/2017 NFA:[id number]             DOS: 08/17/2017, 2:10 PM   LOS: 2 days   Date of Service: The patient was seen and examined on 08/17/2017  Subjective:  Seen in pt's room, wathcing TV, no c/o's, wife at bedside, no questions.  For surgery this evening around 5 pm.    Brief Narrative:   83 year old male with a history of paroxysmal atrial fibrillation, complete heart block, status post pacemaker placement, history of CVA, hypothyroidism, hypertension, on chronic anticoagulation with Eliquis presented status post accidental fall as he slipped on a rug.  Initial evaluation revealed right hip periprosthetic fracture.  Orthopedic was consulted. Pt was admitted.   Exam: General exam: Appears calm and comfortable  HEENT: WNLs Respiratory system: Clear to auscultation. Respiratory effort normal. Cardiovascular system: S1 & S2 heard, RRR. No JVD, murmurs, rubs, gallops or clicks. No pedal edema. Gastrointestinal system: Abd. nondistended, soft and nontender. No organomegaly or masses felt. Normal bowel sounds heard. Central nervous system: Alert and oriented. No focal neurological deficits. Extremities: Symmetric 5 x 5 power.,  Right hip pain, limited range of movement secondary to pain Skin: No rashes, lesions or ulcers  Home meds: -albuterol hfa/ flovent hfa -ecasa/ ranexa 500 bid/ SL ntg prn/ imdur 60/ statin -eliquis 5 mg bid -norvasc 5qd/ chlorthalidone 25 qd -PPI/ MVI/ vitamins/ supplements   Principal Problem:   Hip fracture requiring operative repair, right, closed, initial encounter [facility]) Active Problems:   Hypothyroidism   Paroxysmal atrial fibrillation (HCC)   Essential hypertension   Hip fracture (HCC)   Assessment & Plan:  1) Right hip periprosthetic fracture status post mechanical fall  - Has had cardiac cath in August 2018 which  showed nonobstructive CAD.  Patient is at moderate risk for intermediate risk procedure. Apixaban held on admission, NPO, pain meds.  - for surgery this evening around 5 pm  2) Paroxysmal atrial fibrillation -holding apixaban in anticipation of possible surgery.  Patient is not on any rate limiting medications.  3) History of complete heart block status post pacemaker placement.  4) Hypertension on amlodipine, thiazide.  5) History of CVA on statins and apixaban.  6) Hypothyroidism on Synthroid.  7) Acute renal failure - due to dehydration, creat down 1.3 with IVF"s. Holding thiazide.   8) Normocytic normochromic anemia - Hb 9.1 today, prob due to venipuncture and IVF   Kelly Splinter MD [facility] pgr [phone] 08/17/2017, 2:13 PM   DVT prophylaxis: SCDs. Code Status: Full code. Family Communication: Patient's wife. Disposition Plan: May need rehab. Consults called: Orthopedics. Admission status: Inpatient.  Procedures:   Pending ORIF       Antimicrobials:  Anti-infectives (From admission, onward)   Start     Dose/Rate Route Frequency Ordered Stop   08/17/17 0600  ceFAZolin (ANCEF) IVPB 2g/100 mL premix     2 g 200 mL/hr over 30 Minutes Intravenous On call to O.R. 08/16/17 1351 08/18/17 0559       Objective: Vitals:   08/17/17 0135 08/17/17 0506 08/17/17 0921 08/17/17 1339  BP: (!) 151/66 (!) 148/63 (!) 144/55 (!) 145/55  Pulse: 74 79 78 80  Resp: 16 16 16  16  Temp: 98.6 F (37 C) 97.8 F (36.6 C) 98.3 F (36.8 C) 98 F (36.7 C)  TempSrc: Oral Oral Oral Oral  SpO2: 94% 94% 95% 91%  Weight:      Height:        Intake/Output Summary (Last 24 hours) at 08/17/2017 1410 Last data filed at 08/17/2017 1339 Gross per 24 hour  Intake 1158.33 ml  Output 2150 ml  Net -991.67 ml   Filed Weights   08/14/17 2341  Weight: 83.9 kg (185 lb)      Data Reviewed: I have personally reviewed following labs and imaging studies  CBC: Recent  Labs  Lab 08/15/17 0013 08/16/17 0551 08/17/17 0547  WBC 8.6 7.3 6.3  NEUTROABS 6.1  --   --   HGB 10.9* 10.1* 9.1*  HCT 32.8* 31.0* 26.5*  MCV 90.1 90.9 91.1  PLT 233 236 161   Basic Metabolic Panel: Recent Labs  Lab 08/15/17 0013 08/16/17 0551 08/17/17 0547  NA 135 134* 134*  K 3.5 4.3 3.9  CL 103 101 102  CO2 22 24 25   GLUCOSE 159* 106* 104*  BUN 22* 24* 18  CREATININE 1.82* 1.81* 1.34*  CALCIUM 8.9 8.8* 8.5*  MG  --   --  1.4*   GFR: Estimated Creatinine Clearance: 43.8 mL/min (A) (by C-G formula based on SCr of 1.34 mg/dL (H)). Liver Function Tests: No results for input(s): AST, ALT, ALKPHOS, BILITOT, PROT, ALBUMIN in the last 168 hours. No results for input(s): LIPASE, AMYLASE in the last 168 hours. No results for input(s): AMMONIA in the last 168 hours. Coagulation Profile: Recent Labs  Lab 08/15/17 0013  INR 1.41   Cardiac Enzymes: No results for input(s): CKTOTAL, CKMB, CKMBINDEX, TROPONINI in the last 168 hours. BNP (last 3 results) No results for input(s): PROBNP in the last 8760 hours. HbA1C: No results for input(s): HGBA1C in the last 72 hours. CBG: No results for input(s): GLUCAP in the last 168 hours. Lipid Profile: No results for input(s): CHOL, HDL, LDLCALC, TRIG, CHOLHDL, LDLDIRECT in the last 72 hours. Thyroid Function Tests: No results for input(s): TSH, T4TOTAL, FREET4, T3FREE, THYROIDAB in the last 72 hours. Anemia Panel: No results for input(s): VITAMINB12, FOLATE, FERRITIN, TIBC, IRON, RETICCTPCT in the last 72 hours. Sepsis Labs: No results for input(s): PROCALCITON, LATICACIDVEN in the last 168 hours.  Recent Results (from the past 240 hour(s))  Surgical PCR screen     Status: None   Collection Time: 08/16/17 11:00 PM  Result Value Ref Range Status   MRSA, PCR NEGATIVE NEGATIVE Final   Staphylococcus aureus NEGATIVE NEGATIVE Final    Comment: (NOTE) The Xpert SA Assay (FDA approved for NASAL specimens in patients 62 years of  age and older), is one component of a comprehensive surveillance program. It is not intended to diagnose infection nor to guide or monitor treatment. Performed at [facility], [place] [address], [place], [place] [address]       Radiology Studies: No results found.  Scheduled Meds: . amLODipine  5 mg Oral Daily  . atorvastatin  40 mg Oral Daily  . budesonide  0.5 mg Inhalation BID  . folic acid  540 mcg Oral Daily  . isosorbide mononitrate  60 mg Oral Daily  . levothyroxine  50 mcg Oral QAC breakfast  . pantoprazole  40 mg Oral Daily  . ranolazine  500 mg Oral BID  . vitamin B-12  250 mcg Oral Daily   Continuous Infusions: . sodium chloride 75  mL/hr at 08/17/17 0820  . sodium chloride 50 mL/hr at 08/16/17 1433  .  ceFAZolin (ANCEF) IV

## 2017-08-17 NOTE — Interval H&P Note (Signed)
History and Physical Interval Note:  08/17/2017 5:22 PM  [name]  has presented today for surgery, with the diagnosis of right hip periprosthetic fracture  The various methods of treatment have been discussed with the patient and family. After consideration of risks, benefits and other options for treatment, the patient has consented to  Procedure(s): OPEN REDUCTION INTERNAL FIXATION (ORIF) PERIPROSTHETIC FRACTURE RIGHT FEMUR (Right) as a surgical intervention .  The patient's history has been reviewed, patient examined, no change in status, stable for surgery.  I have reviewed the patient's chart and labs.  Questions were answered to the patient's satisfaction.     Mauri Pole

## 2017-08-17 NOTE — Transfer of Care (Signed)
Immediate Anesthesia Transfer of Care Note  Patient: [name]  Procedure(s) Performed: OPEN REDUCTION INTERNAL FIXATION (ORIF) PERIPROSTHETIC FRACTURE RIGHT FEMUR (Right )  Patient Location: PACU  Anesthesia Type:General  Level of Consciousness: drowsy and patient cooperative  Airway & Oxygen Therapy: Patient Spontanous Breathing and Patient connected to face mask  Post-op Assessment: Report given to RN and Post -op Vital signs reviewed and stable  Post vital signs: Reviewed and stable  Last Vitals:  Vitals:   08/17/17 0921 08/17/17 1339  BP: (!) 144/55 (!) 145/55  Pulse: 78 80  Resp: 16 16  Temp: 36.8 C 36.7 C  SpO2: 95% 91%    Last Pain:  Vitals:   08/17/17 1638  TempSrc:   PainSc: 3       Patients Stated Pain Goal: 5 (57/26/20 3559)  Complications: No apparent anesthesia complications

## 2017-08-17 NOTE — Anesthesia Preprocedure Evaluation (Addendum)
Anesthesia Evaluation  Patient identified by MRN, date of birth, ID band Patient awake    Reviewed: Allergy & Precautions, NPO status , Patient's Chart, lab work & pertinent test results  Airway Mallampati: I       Dental  (+) Edentulous Upper, Edentulous Lower   Pulmonary asthma ,    Pulmonary exam normal breath sounds clear to auscultation       Cardiovascular hypertension, Pt. on medications + angina + CAD  Normal cardiovascular exam+ dysrhythmias Atrial Fibrillation + pacemaker  Rhythm:Regular Rate:Normal     Neuro/Psych negative neurological ROS  negative psych ROS   GI/Hepatic Neg liver ROS, GERD  Medicated,  Endo/Other  Hypothyroidism   Renal/GU   negative genitourinary   Musculoskeletal negative musculoskeletal ROS (+)   Abdominal Normal abdominal exam  (+)   Peds  Hematology  (+) Blood dyscrasia, anemia ,   Anesthesia Other Findings [name]  CARDIAC CATHETERIZATION  Order# [id number]  Reading physician: Martinique, Peter M, MD Ordering physician: Martinique, Peter M, MD Study date: 02/08/17 Physicians   Panel Physicians Referring Physician Case Authorizing Physician Martinique, Peter M, MD (Primary)   Procedures   LEFT HEART CATH AND CORONARY ANGIOGRAPHY Conclusion     Mid RCA lesion, 60 %stenosed.  Prox LAD lesion, 40 %stenosed.  Prox LAD to Mid LAD lesion, 50 %stenosed.  LPDA lesion, 60 %stenosed.   1. Nonobstructive CAD  Plan: medical management.   Indications   Chest pain, unspecified type (R07.9 (ICD-10-CM)) Procedural Details/Technique   Technical   [place]  VAS US CAROTID DUPLEX BILATERAL  Order# [id number]  Reading physician: Elam Dutch, MD Ordering physician: Larey Dresser, MD Study date: 02/09/17 Patient Information   Patient Name [name], [name] Sex Male DOB 07/09/1934 SSN [id number] Study Information   Physician Technologist Supporting  Staff  Birdena Crandall, [place]  Reason For Exam  Priority: Routine  carotid bruit MERGE Images   Show images for VAS US CAROTID Syngo Images   Show images for VAS US CAROTID Result Notes for VAS US CAROTID   Notes recorded by Larey Dresser, MD on 02/12/2017 at 8:10 PM EDT Known occluded RICA. Mild LICA disease.   02/09/2017 5:42 PM - Interface, Rad Results In   Narrative             *[facility]*            [address], [place] [address]              [phone]  ------------------------------------------------------------------- Noninvasive Vascular Lab  Carotid Duplex Study  Patient:  [name], [name] MR #:    [id number] Study Date: 02/09/2017 Gender:   M Age:    83 Height:   162.6 cm Weight:   86.4 kg BSA:    2.01 m^2 Pt. Status: Room:    3E26C  ADMITTING  Fransico Him, MD SONOGRAPHER Toma Copier, RVS ATTENDING  Loralie Champagne, M.D. ORDERING   Loralie Champagne, M.D. REFERRING  Loralie Champagne, M.D.  Reports also to:  ------------------------------------------------------------------- History and indications:  Indications  785.9 Bruit. History  Diagnostic evaluation. A bruitof the left carotid artery. A bruitof the right carotid artery.  ------------------------------------------------------------------- Study information:  Study status: Routine. Procedure: The right CCA, right ECA, right ICA, right vertebral, left CCA, left ECA, left ICA, and left vertebral arteries were  examined. A vascular evaluation was performed with the patient in the supine position. Image quality was adequate.  Carotid duplex study.   Carotid Duplex exam including 2D imaging, color and spectral Doppler were performed on the extracranial carotid and vertebral arteries using  standard established protocols. Birthdate: Patient birthdate: 10-03-1934. Age: Patient is 83 yr old. Sex: Gender: male. Height: Height: 162.6 cm. Height: 64 in. Weight: Weight: 86.4 kg. Weight: 190.1 lb. Body mass index: BMI: 32.7 kg/m^2. Body surface area: BSA: 2.01 m^2. Study date: Study date: 02/09/2017. Study time: 12:23 PM. Location: Vascular laboratory. Patient status: Inpatient.  Arterial flow:  +---------------+---------+-------+--------------+----------------+ !Location    !V sys  !V ed  !Flow analysis !Comment     ! +---------------+---------+-------+--------------+----------------+ !Right CCA -  !61 cm/s !2 cm/s !--------------!Mild      ! !proximal    !     !    !       !heterogeneous  ! !        !     !    !       !plaque.     ! +---------------+---------+-------+--------------+----------------+ !Right CCA -  !-40 cm/s !-1 cm/s!--------------!Mild      ! !distal     !     !    !       !heterogneous  ! !        !     !    !       !plaque.     ! +---------------+---------+-------+--------------+----------------+ !Right ECA   !-173 cm/s!-2 cm/s!--------------!Moderate    ! !        !     !    !       !irregular    ! !        !     !    !       !calcific and non! !        !     !    !       !calcific plaque.! +---------------+---------+-------+--------------+----------------+ !Right ICA -  !-63 cm/s !-------!--------------!Severe     ! !proximal    !     !    !       !heterogeneous  ! !        !     !    !       !plaque.     ! +---------------+---------+-------+--------------+----------------+ !Right ICA - mid!---------!-------!--------------!Occluded.     ! +---------------+---------+-------+--------------+----------------+ !Right ICA -  !---------!-------!--------------!Occluded.    ! !distal     !     !    !       !        ! +---------------+---------+-------+--------------+----------------+ !Right vertebral!-41 cm/s !-9 cm/s!Antegrade flow!----------------! +---------------+---------+-------+--------------+----------------+ !Right     !-102 cm/s!-------!--------------!----------------! !subclavian   !     !    !       !        ! +---------------+---------+-------+--------------+----------------+ !Left CCA -   !106 cm/s !19 cm/s!--------------!Mild      ! !proximal    !     !    !       !heterogeneous  ! !        !     !    !       !plaque.     ! +---------------+---------+-------+--------------+----------------+ !Left CCA -   !100 cm/s !30 cm/s!--------------!Mild      ! !  distal     !     !    !       !heterogeneous  ! !        !     !    !       !plaque.     ! +---------------+---------+-------+--------------+----------------+ !Left ECA    !-123 cm/s!0 cm/s !--------------!Mild diffuse  ! !        !     !    !       !heterogeneous  ! !        !     !    !       !plaque.     ! +---------------+---------+-------+--------------+----------------+ !Left ICA -   !-129 cm/s!-34  !--------------!Mild      ! !proximal    !     !cm/s  !       !heterogeneous  ! !        !     !    !       !plaque.     ! +---------------+---------+-------+--------------+----------------+ !Left ICA - mid !-90 cm/s !-27  !--------------!----------------! !        !     !cm/s  !       !        ! +---------------+---------+-------+--------------+----------------+ !Left ICA -    !-91 cm/s !-31  !--------------!----------------! !distal     !     !cm/s  !       !        ! +---------------+---------+-------+--------------+----------------+ !Left vertebral !-66 cm/s !-22  !Antegrade flow!----------------! !        !     !cm/s  !       !        ! +---------------+---------+-------+--------------+----------------+ !Left subclavian!184 cm/s !-------!--------------!----------------! +---------------+---------+-------+--------------+----------------+  ------------------------------------------------------------------- Summary:  - Technically difficult due to respiratory interference. - Right - Known RICA occlusion since 2016. Vertebral artey flow is antegrade. - Left - 1% to 39% ICA stenosis. Vertebral artery flow is anterade.  Other specific details can be found in the table(s) above. Prepared and Electronically Authenticated by  Jessy Oto. Fields MD 2018-08-22T17:42:35 Lab and Collection   VAS US CAROTID - 02/08/2017  Surgical History   Surgical History    Procedure Laterality Date Comment Source INSERT / REPLACE / REMOVE PACEMAKER   [employer] Provider  Other Surgical History    Procedure Laterality Date Comment Source CATARACT EXTRACTION    Provider HIP SURGERY    Provider LEFT HEART CATH AND CORONARY ANGIOGRAPHY N/A 02/08/2017 Procedure: LEFT HEART CATH AND CORONARY ANGIOGRAPHY; Surgeon: Martinique, Peter M, MD; Location: [facility]; Service: Cardiovascular; Laterality: N/A; Provider  Medical History   Diagnosis Date Comment Source Asthma   Provider Hypertension   Provider Peripheral vascular disease [facility])   Provider Implants     No active implants to display in this view. Vitals   Height Weight BMI (Calculated) 5\' 4"  (1.626 m) 192 lb 12.8 oz (87.5 kg) 33.08 Imaging   Imaging Information Exam Information   Status Exam Begun  Exam Ended  Final (99) 02/09/2017 12:30  PM 02/09/2017 1:50 PM Order-Level Documents:   There are no order-level documents.  Encounter-Level Documents - 02/08/2017:   Scan on 02/10/2017 12:25 PM by Default, Provider, MDScan on 02/10/2017 12:25 PM by Default, Provider, MD  Scan on 02/10/2017 11:47 AM by Default, Provider, MDScan on 02/10/2017 11:47 AM by Default, Provider, MD  Scan on  02/10/2017 11:28 AM by Victory Dakin on 02/10/2017 11:28 AM by Lindwood Qua  Scan on 02/10/2017 11:27 AM by Victory Dakin on 02/10/2017 11:27 AM by Lindwood Qua  Scan on 02/10/2017 11:31 AM by Default, Provider, MDScan on 02/10/2017 11:31 AM by Default, Provider, MD  Document on 02/09/2017 2:10 PM by Rolm Baptise, RN: IP After Visit Summary  Document on 02/09/2017 12:18 PM by Rolm Baptise, RN: IP After Visit Summary  Scan on 02/08/2017 12:52 PM by Default, Provider, MDScan on 02/08/2017 12:52 PM by Default, Provider, MD  Scan on 02/09/2017 9:05 AM by Default, Provider, MDScan on 02/09/2017 9:05 AM by Default, Provider, MD  Scan on 02/08/2017 7:27 AM by Default, Provider, MDScan on 02/08/2017 7:27 AM by Default, Provider, MD  Scan on 02/09/2017 9:02 AM by Default, Provider, MDScan on 02/09/2017 9:02 AM by Default, Provider, MD  Scan on 02/08/2017 12:11 AM by Sharmaine Base K: 02/08/17 [place] on 02/08/2017 12:11 AM by Sharmaine Base K: 02/08/17 [facility] Demographic Facesheet    Signed   Electronically signed by Elam Dutch, MD on 02/09/17 at 1742 EDT External Result Report   External Result Report    Reproductive/Obstetrics                            Anesthesia Physical Anesthesia Plan  ASA: III  Anesthesia Plan: General   Post-op Pain Management:    Induction: Intravenous  PONV Risk Score and Plan: 3 and Ondansetron and Dexamethasone  Airway Management Planned: Oral ETT  Additional Equipment:   Intra-op Plan:   Post-operative Plan: Extubation in  OR  Informed Consent: I have reviewed the patients History and Physical, chart, labs and discussed the procedure including the risks, benefits and alternatives for the proposed anesthesia with the patient or authorized representative who has indicated his/her understanding and acceptance.     Plan Discussed with: CRNA and Surgeon  Anesthesia Plan Comments:         Anesthesia Quick Evaluation

## 2017-08-17 NOTE — Progress Notes (Signed)
     Subjective:   Procedure(s) (LRB): OPEN REDUCTION INTERNAL FIXATION (ORIF) PERIPROSTHETIC FRACTURE RIGHT FEMUR (Right)   Patient reports pain as moderate, pain controlled. Plan is for surgery today.  NPO now, and surgery planned for about 1700 today.  Objective:   VITALS:   Vitals:   08/17/17 0135 08/17/17 0506  BP: (!) 151/66 (!) 148/63  Pulse: 74 79  Resp: 16 16  Temp: 98.6 F (37 C) 97.8 F (36.6 C)  SpO2: 94% 94%    Intact pulses distally Dorsiflexion/Plantar flexion intact Compartment soft  LABS Recent Labs    08/15/17 0013 08/16/17 0551 08/17/17 0547  HGB 10.9* 10.1* 9.1*  HCT 32.8* 31.0* 26.5*  WBC 8.6 7.3 6.3  PLT 233 236 189    Recent Labs    08/15/17 0013 08/16/17 0551 08/17/17 0547  NA 135 134* 134*  K 3.5 4.3 3.9  BUN 22* 24* 18  CREATININE 1.82* 1.81* 1.34*  GLUCOSE 159* 106* 104*     Assessment/Plan:   Procedure(s) (LRB): OPEN REDUCTION INTERNAL FIXATION (ORIF) PERIPROSTHETIC FRACTURE RIGHT FEMUR (Right)  NPO now OR later today for ORIF of right periprosthetic femur fx per Dr. Alvan Dame Planning on SNF upon d/c after surgery     West Pugh. Zhania Shaheen   PAC  08/17/2017, 9:18 AM

## 2017-08-17 NOTE — Brief Op Note (Signed)
08/14/2017 - 08/17/2017  5:26 PM  PATIENT:  [name]  83 y.o. male  PRE-OPERATIVE DIAGNOSIS:  right hip periprosthetic proximal femur fracture  POST-OPERATIVE DIAGNOSIS:  right hip periprosthetic proximal femur fracture  PROCEDURE:  Procedure(s): OPEN REDUCTION INTERNAL FIXATION (ORIF) PERIPROSTHETIC FRACTURE RIGHT FEMUR (Right)  Zimmer Cables X3  SURGEON:  Surgeon(s) and Role:    Paralee Cancel, MD - Primary  PHYSICIAN ASSISTANT: Danae Orleans, PA-C  ANESTHESIA:   general  EBL:  150cc  BLOOD ADMINISTERED:none  DRAINS: none   LOCAL MEDICATIONS USED:  NONE  SPECIMEN:  No Specimen  DISPOSITION OF SPECIMEN:  N/A  COUNTS:  YES  TOURNIQUET:  * No tourniquets in log *  DICTATION: .Other Dictation: Dictation Number [id number]  PLAN OF CARE: Admit to inpatient   PATIENT DISPOSITION:  PACU - hemodynamically stable.   Delay start of Pharmacological VTE agent (>24hrs) due to surgical blood loss or risk of bleeding: no

## 2017-08-17 NOTE — Anesthesia Procedure Notes (Signed)
Procedure Name: Intubation Date/Time: 08/17/2017 5:43 PM Performed by: Lavina Hamman, CRNA Pre-anesthesia Checklist: Patient identified, Emergency Drugs available, Suction available, Patient being monitored and Timeout performed Patient Re-evaluated:Patient Re-evaluated prior to induction Oxygen Delivery Method: Circle system utilized Preoxygenation: Pre-oxygenation with 100% oxygen Induction Type: IV induction Ventilation: Mask ventilation without difficulty Laryngoscope Size: Mac and 4 Grade View: Grade I Tube type: Oral Tube size: 7.5 mm Number of attempts: 1 Airway Equipment and Method: Stylet Placement Confirmation: ETT inserted through vocal cords under direct vision,  positive ETCO2,  CO2 detector and breath sounds checked- equal and bilateral Secured at: 22 cm Tube secured with: Tape Dental Injury: Teeth and Oropharynx as per pre-operative assessment

## 2017-08-17 NOTE — H&P (View-Only) (Signed)
     Subjective:   Procedure(s) (LRB): OPEN REDUCTION INTERNAL FIXATION (ORIF) PERIPROSTHETIC FRACTURE RIGHT FEMUR (Right)   Patient reports pain as moderate, pain controlled. Plan is for surgery today.  NPO now, and surgery planned for about 1700 today.  Objective:   VITALS:   Vitals:   08/17/17 0135 08/17/17 0506  BP: (!) 151/66 (!) 148/63  Pulse: 74 79  Resp: 16 16  Temp: 98.6 F (37 C) 97.8 F (36.6 C)  SpO2: 94% 94%    Intact pulses distally Dorsiflexion/Plantar flexion intact Compartment soft  LABS Recent Labs    08/15/17 0013 08/16/17 0551 08/17/17 0547  HGB 10.9* 10.1* 9.1*  HCT 32.8* 31.0* 26.5*  WBC 8.6 7.3 6.3  PLT 233 236 189    Recent Labs    08/15/17 0013 08/16/17 0551 08/17/17 0547  NA 135 134* 134*  K 3.5 4.3 3.9  BUN 22* 24* 18  CREATININE 1.82* 1.81* 1.34*  GLUCOSE 159* 106* 104*     Assessment/Plan:   Procedure(s) (LRB): OPEN REDUCTION INTERNAL FIXATION (ORIF) PERIPROSTHETIC FRACTURE RIGHT FEMUR (Right)  NPO now OR later today for ORIF of right periprosthetic femur fx per Dr. Alvan Dame Planning on SNF upon d/c after surgery     West Pugh. Sou Nohr   PAC  08/17/2017, 9:18 AM

## 2017-08-17 NOTE — Anesthesia Postprocedure Evaluation (Signed)
Anesthesia Post Note  Patient: LORRIN NAWROT  Procedure(s) Performed: OPEN REDUCTION INTERNAL FIXATION (ORIF) PERIPROSTHETIC FRACTURE RIGHT FEMUR (Right )     Patient location during evaluation: PACU Anesthesia Type: General Level of consciousness: awake and sedated Pain management: pain level controlled Vital Signs Assessment: post-procedure vital signs reviewed and stable Respiratory status: spontaneous breathing Cardiovascular status: stable Postop Assessment: no apparent nausea or vomiting Anesthetic complications: no    Last Vitals:  Vitals:   08/17/17 2000 08/17/17 2053  BP: (!) 109/50 (!) 136/58  Pulse: 95 92  Resp: 16 16  Temp: 37.1 C 37.2 C  SpO2: 98% 94%    Last Pain:  Vitals:   08/17/17 2053  TempSrc: Oral  PainSc:    Pain Goal: Patients Stated Pain Goal: 2 (08/17/17 2000)               Aaliyan Brinkmeier JR,JOHN Mateo Flow

## 2017-08-18 ENCOUNTER — Encounter (HOSPITAL_COMMUNITY): Payer: Self-pay | Admitting: Orthopedic Surgery

## 2017-08-18 LAB — BASIC METABOLIC PANEL
Anion gap: 11 (ref 5–15)
BUN: 20 mg/dL (ref 6–20)
CALCIUM: 8.6 mg/dL — AB (ref 8.9–10.3)
CO2: 21 mmol/L — ABNORMAL LOW (ref 22–32)
CREATININE: 1.32 mg/dL — AB (ref 0.61–1.24)
Chloride: 101 mmol/L (ref 101–111)
GFR calc non Af Amer: 49 mL/min — ABNORMAL LOW (ref >60)
GFR, EST AFRICAN AMERICAN: 57 mL/min — AB (ref >60)
Glucose, Bld: 158 mg/dL — ABNORMAL HIGH (ref 65–99)
Potassium: 4.2 mmol/L (ref 3.5–5.1)
SODIUM: 133 mmol/L — AB (ref 135–145)

## 2017-08-18 LAB — CBC
HCT: 26.5 % — ABNORMAL LOW (ref 39.0–52.0)
Hemoglobin: 9 g/dL — ABNORMAL LOW (ref 13.0–17.0)
MCH: 30.8 pg (ref 26.0–34.0)
MCHC: 34 g/dL (ref 30.0–36.0)
MCV: 90.8 fL (ref 78.0–100.0)
Platelets: 224 10*3/uL (ref 150–400)
RBC: 2.92 MIL/uL — ABNORMAL LOW (ref 4.22–5.81)
RDW: 12.6 % (ref 11.5–15.5)
WBC: 8.2 10*3/uL (ref 4.0–10.5)

## 2017-08-18 NOTE — Op Note (Signed)
NAMEHUNTINGTON, LEVERICH              ACCOUNT NO.:  [id number]  MEDICAL RECORD NO.:  [id number]  LOCATION:  0626                         FACILITY:  [facility]  PHYSICIAN:  Pietro Cassis. Alvan Dame, M.D.  DATE OF BIRTH:  12-03-1934  DATE OF PROCEDURE:  08/17/2017 DATE OF DISCHARGE:                              OPERATIVE REPORT   PREOPERATIVE DIAGNOSIS:  Periprosthetic right proximal femur fracture around a stable femoral prosthesis.  POSTOPERATIVE DIAGNOSIS:  Periprosthetic right proximal femur fracture around a stable femoral prosthesis.  PROCEDURE:  Open reduction and internal fixation of right proximal femur fracture.  COMPOENTS USED:  Three Zimmer 1.8-mm cables.  SURGEON:  Pietro Cassis. Alvan Dame, M.D.  ASSISTANT:  Danae Orleans, PA-C.  Note that Mr. Guinevere Scarlet was present for the entirety of the case from preoperative positioning, perioperative management of the operative extremity, general facilitation of the case and primary wound closure.  ANESTHESIA:  General.  SPECIMENS:  None.  COMPLICATION:  None.  BLOOD LOSS:  About 100 cc.  INDICATIONS FOR PROCEDURE:  Mr. Outten is an 83 year old gentleman with prior history of right total hip replacement with [employer] Omnifit stems.  He has had a history of a left periprosthetic fracture treated in a similar fashion.  He unfortunately had a fall on a rug.  He sustained a fracture in his proximal femur.  His femoral component appeared to be, did not subside.  He was on Eliquis.  After appropriate delay in surgery, surgery was scheduled.  Risks and benefits were reviewed with him.  Consent was obtained for benefit of stability of his fracture and healing.  PROCEDURE IN DETAIL:  The patient was brought to the operative theater. Once adequate anesthesia, preoperative antibiotics, Ancef administered, he was positioned into the left lateral decubitus position with his right hip up.  The right lower extremity was then prepped and draped in sterile  fashion.  Time-out was performed, identifying the patient, planned procedure and extremity.  His old incision was noted.  I used the distal portion of incision from the area of the tip of the trochanter or the vastus ridge distally. Based on the appearance radiographically, it was all contained within the 12-14 cm of the stem.  Soft tissue dissection was carried down to the iliotibial band, which was incised.  I then elevated the vastus lateralis off the posterior intermuscular septum.  The cauterization of vessels was carried out as needed.  The fracture site was identified.  After debridement around it, I exposed the extent of the fracture that was visualized.  This correlated with plain film pictures.  I then used a [employer] and passed three cables over the extent of the fracture pattern.  These cables were then tensioned twice and then crimped and cut.  The fracture appeared to be anatomically reduced.  Please note that I used a Lane clamp to reduce the fracture into an anatomic position.  This was done prior to the cables being passed and then tightened down and crimped.  Once this was done, we irrigated the wound.  The vastus lateralis was laid back over top of the fixation and the iliotibial band reapproximated using #1 Vicryl and #1 Stratafix suture.  The  remainder of the wound was closed with 2-0 Vicryl and then running Monocryl stitch.  The skin was then cleaned, dried and dressed sterilely using surgical glue and an Aquacel dressing.  He was then brought to the recovery room in stable condition tolerating the procedure well.  It will be important for him to be partial weightbearing for 6-8 weeks to allow for bony union.  I will see him back in the office for 2 weeks for wound check and radiographs.  Discharge per Medical Team.     Pietro Cassis. Alvan Dame, M.D.     MDO/MEDQ  D:  08/17/2017  T:  08/18/2017  Job:  [id number]

## 2017-08-18 NOTE — Plan of Care (Signed)
Plan of care reviewed and discussed with patient.   

## 2017-08-18 NOTE — Progress Notes (Signed)
Inpatient Rehabilitation  Per PT request, patient was screened by Gunnar Fusi for appropriateness for an Inpatient Acute Rehab consult.  At this time do not anticipate patient will meet medical criteria for an IP Rehab admission.  Also doubtful that his insurance would approve IP Rehab for this diagnosis.  Would recommend an OT eval as well as clarification with spouse on level of assist she would be able to provide upon discharge.  Call if questions.   Carmelia Roller., CCC/SLP Admission Coordinator  [facility]  Cell [phone]

## 2017-08-18 NOTE — Evaluation (Signed)
Physical Therapy Evaluation Patient Details Name: [name] MRN: [id number] DOB: October 18, 1934 Today's Date: 08/18/2017   History of Present Illness  83 yo male  admitted 08/14/17 after fall at home, sustained  right femur periprosthetic fracture, S/P OPEN REDUCTION INTERNAL FIXATION (ORIF) PERIPROSTHETIC FRACTURE RIGHT FEMUR on 07/2717. H/O stroke, PPM, BTHA, Left femur periprosthetic fracture with cabling.   Clinical Impression  The patient tolerated  Mobilizing to standing and taking pivot steps to recliner with 2 assist and RW. The patient  Remained  PWB on the right, used KI on RLE. The  Patient will benefit from post acute rehab after DC. Wife will be caregiver with son intermittently assisting. Pt admitted with above diagnosis. Pt currently with functional limitations due to the deficits listed below (see PT Problem List). Pt will benefit from skilled PT to increase their independence and safety with mobility to allow discharge to the venue listed below.       Follow Up Recommendations CIR;SNF, unless family can provide 2 to assist at discharge. Wife not present to discuss.   Equipment Recommendations  None recommended by PT    Recommendations for Other Services Rehab consult     Precautions / Restrictions Precautions Precautions: Fall Precaution Comments: KI for comfort Required Braces or Orthoses: Knee Immobilizer - Right Restrictions Weight Bearing Restrictions: Yes RLE Weight Bearing: Weight bearing as tolerated RLE Partial Weight Bearing Percentage or Pounds: 50      Mobility  Bed Mobility Overal bed mobility: Needs Assistance Bed Mobility: Supine to Sit     Supine to sit: Max assist;+2 for physical assistance;+2 for safety/equipment;HOB elevated     General bed mobility comments: assist with right leg and trunk, extra time to mobilize  Transfers Overall transfer level: Needs assistance Equipment used: Rolling [place] (2 wheeled) Transfers: Sit to/from  [facility] Sit to Stand: Mod assist;+2 physical assistance;+2 safety/equipment Stand pivot transfers: +2 physical assistance;Mod assist;+2 safety/equipment       General transfer comment: bed raised, assist to power up and to steady at the RW, small steps to turn to recliner.  Ambulation/Gait                Stairs            Wheelchair Mobility    Modified Rankin (Stroke Patients Only)       Balance Overall balance assessment: History of Falls;Needs assistance Sitting-balance support: Feet supported;Bilateral upper extremity supported Sitting balance-Leahy Scale: Fair     Standing balance support: During functional activity;Bilateral upper extremity supported Standing balance-Leahy Scale: Poor Standing balance comment: relies on UEs                             Pertinent Vitals/Pain Pain Assessment: 0-10 Pain Score: 5  Pain Location: right thigh Pain Descriptors / Indicators: Discomfort;Operative site guarding Pain Intervention(s): Premedicated before session;Monitored during session;Patient requesting pain meds-RN notified;Limited activity within patient's tolerance    Home Living Family/patient expects to be discharged to:: Private residence Living Arrangements: Spouse/significant other;Children Available Help at Discharge: Family Type of Home: House Home Access: Stairs to enter Entrance Stairs-Rails: None Entrance Stairs-Number of Steps: 2 Home Layout: One level Home Equipment: Environmental consultant - 2 wheels   wheel chair   Prior Function Level of Independence: Independent               Hand Dominance        Extremity/Trunk Assessment   Upper Extremity Assessment Upper  Extremity Assessment: Generalized weakness    Lower Extremity Assessment Lower Extremity Assessment: RLE deficits/detail RLE Deficits / Details: leg in KI    Cervical / Trunk Assessment Cervical / Trunk Assessment: Kyphotic  Communication    Communication: No difficulties  Cognition Arousal/Alertness: Awake/alert Behavior During Therapy: WFL for tasks assessed/performed Overall Cognitive Status: Within Functional Limits for tasks assessed                                        General Comments      Exercises     Assessment/Plan    PT Assessment Patient needs continued PT services  PT Problem List Decreased strength;Decreased range of motion;Decreased activity tolerance;Decreased knowledge of use of DME;Decreased balance;Decreased safety awareness;Decreased mobility;Decreased knowledge of precautions;Pain       PT Treatment Interventions DME instruction;Gait training;Functional mobility training;Therapeutic activities;Patient/family education;Therapeutic exercise    PT Goals (Current goals can be found in the Care Plan section)  Acute Rehab PT Goals Patient Stated Goal: agrees to getting OOB PT Goal Formulation: With patient Time For Goal Achievement: 09/01/17 Potential to Achieve Goals: Good    Frequency Min 3X/week   Barriers to discharge        Co-evaluation               AM-PAC PT "6 Clicks" Daily Activity  Outcome Measure Difficulty turning over in bed (including adjusting bedclothes, sheets and blankets)?: Unable Difficulty moving from lying on back to sitting on the side of the bed? : Unable Difficulty sitting down on and standing up from a chair with arms (e.g., wheelchair, bedside commode, etc,.)?: Unable Help needed moving to and from a bed to chair (including a wheelchair)?: Total Help needed walking in hospital room?: Total Help needed climbing 3-5 steps with a railing? : Total 6 Click Score: 6    End of Session Equipment Utilized During Treatment: Gait belt;Right knee immobilizer Activity Tolerance: Patient tolerated treatment well Patient left: in chair;with call bell/phone within reach;with chair alarm set Nurse Communication: Mobility status PT Visit Diagnosis:  Unsteadiness on feet (R26.81);History of falling (Z91.81)    Time: 9892-1194 PT Time Calculation (min) (ACUTE ONLY): 26 min   Charges:   PT Evaluation $PT Eval Moderate Complexity: 1 Mod PT Treatments $Therapeutic Activity: 8-22 mins   PT G CodesTresa Endo PT [phone]   [name] 08/18/2017, 9:52 AM

## 2017-08-18 NOTE — Progress Notes (Signed)
     Subjective: 1 Day Post-Op Procedure(s) (LRB): OPEN REDUCTION INTERNAL FIXATION (ORIF) PERIPROSTHETIC FRACTURE RIGHT FEMUR (Right)   Patient reports pain as mild, pain controlled. No reported events throughout the night.    Objective:   VITALS:   Vitals:   08/18/17 0110 08/18/17 0554  BP: (!) 117/50 (!) 155/60  Pulse: 90 88  Resp: 16 16  Temp: 99 F (37.2 C) 98.6 F (37 C)  SpO2: 94% 97%    Dorsiflexion/Plantar flexion intact Incision: dressing C/D/I, bottom of dressing is pulling up and not longer adhesive No cellulitis present Compartment soft  LABS Recent Labs    08/16/17 0551 08/17/17 0547 08/18/17 0500  HGB 10.1* 9.1* 9.0*  HCT 31.0* 26.5* 26.5*  WBC 7.3 6.3 8.2  PLT 236 189 224    Recent Labs    08/16/17 0551 08/17/17 0547 08/18/17 0500  NA 134* 134* 133*  K 4.3 3.9 4.2  BUN 24* 18 20  CREATININE 1.81* 1.34* 1.32*  GLUCOSE 106* 104* 158*     Assessment/Plan: 1 Day Post-Op Procedure(s) (LRB): OPEN REDUCTION INTERNAL FIXATION (ORIF) PERIPROSTHETIC FRACTURE RIGHT FEMUR (Right) Asked nurse to replace dressing when able KI as needed for comfort PT to work with patient Medical service caring for patient, we will continue to follow   Ortho recommendations:  Resume home Eliquis upon d/c for anticoagulation, unless other medically indicated.  Norco for pain management (Rx written).  MiraLax and Colace for constipation  Iron 325 mg tid for 2-3 weeks   PWB 50% on the right leg.  Dressing to remain in place until follow in clinic in 2 weeks.  Dressing is waterproof and may shower with it in place.  Follow up in 2 weeks at [facility]. Follow up with OLIN,Elliott Lasecki D in 2 weeks.  Contact information:  [facility] [address] [place] Andre Gallego   PAC  08/18/2017, 8:57 AM

## 2017-08-18 NOTE — Care Management Important Message (Signed)
Important Message  Patient Details  Name: [name] MRN: [id number] Date of Birth: 30-Oct-1934   Medicare Important Message Given:  Yes    Kerin Salen 08/18/2017, 10:26 AMImportant Message  Patient Details  Name: [name] MRN: [id number] Date of Birth: 1935-04-30   Medicare Important Message Given:  Yes    Kerin Salen 08/18/2017, 10:26 AM

## 2017-08-18 NOTE — Progress Notes (Addendum)
Physical Therapy Treatment Patient Details Name: [name] MRN: [id number] DOB: [birth year] Today's Date: 08/18/2017    History of Present Illness 83 yo male  admitted 08/14/17 after fall at home, sustained  right femur periprosthetic fracture, S/P OPEN REDUCTION INTERNAL FIXATION (ORIF) PERIPROSTHETIC FRACTURE RIGHT FEMUR on 07/2717. H/O stroke, PPM, BTHA, Left femur periprosthetic fracture with cabling.     PT Comments    Patient required extensive assistance of 2  to stand from recliner and  pivot to bed and return to supine. Wife present. Per MD note, wife reports 24/4 caregivers available and plans DC home. Patient has WC and lift chair. Continue PT.  Please order OT.  Follow Up Recommendations  Home health PT;(wife reports home w/24 /7) SNF if not available at home     Equipment Recommendations  None recommended by PT    Recommendations for Other Services  OT consult     Precautions / Restrictions Precautions Precaution Comments: KI for comfort Required Braces or Orthoses: Knee Immobilizer - Right Restrictions RLE Weight Bearing: Partial weight bearing RLE Partial Weight Bearing Percentage or Pounds: 50    Mobility  Bed Mobility Overal bed mobility: Needs Assistance Bed Mobility: Sit to Supine       Sit to supine: Total assist;+2 for physical assistance;+2 for safety/equipment   General bed mobility comments: assist with both legs and trunk back into bed  Transfers Overall transfer level: Needs assistance Equipment used: Rolling [place] (2 wheeled) Transfers: Sit to/from [facility] Sit to Stand: Total assist;+2 physical assistance;+2 safety/equipment Stand pivot transfers: Max assist;+2 physical assistance;+2 safety/equipment       General transfer comment: 2 totalassist to stand from recliner, to power up to stand up. Extra time to slide the right leg back. Extra time and steady assist to take small steps to turn  and take small backward  steps to bed. cues for hand placement to reach back.  Ambulation/Gait                 Stairs            Wheelchair Mobility    Modified Rankin (Stroke Patients Only)       Balance                                            Cognition Arousal/Alertness: Awake/alert Behavior During Therapy: WFL for tasks assessed/performed Overall Cognitive Status: Within Functional Limits for tasks assessed                                        Exercises      General Comments        Pertinent Vitals/Pain Pain Score: 5  Pain Location: right leg Pain Descriptors / Indicators: Discomfort;Operative site guarding Pain Intervention(s): Limited activity within patient's tolerance;Monitored during session;Premedicated before session;Repositioned    Home Living                      Prior Function            PT Goals (current goals can now be found in the care plan section) Progress towards PT goals: Progressing toward goals    Frequency    Min 5X/week(plans home)      PT Plan Discharge plan needs  to be updated;Frequency needs to be updated(wife says home)    Co-evaluation              AM-PAC PT "6 Clicks" Daily Activity  Outcome Measure  Difficulty turning over in bed (including adjusting bedclothes, sheets and blankets)?: Unable Difficulty moving from lying on back to sitting on the side of the bed? : Unable Difficulty sitting down on and standing up from a chair with arms (e.g., wheelchair, bedside commode, etc,.)?: Unable Help needed moving to and from a bed to chair (including a wheelchair)?: Total Help needed walking in hospital room?: Total Help needed climbing 3-5 steps with a railing? : Total 6 Click Score: 6    End of Session Equipment Utilized During Treatment: Gait belt;Right knee immobilizer Activity Tolerance: Patient tolerated treatment well Patient left: in bed;with call bell/phone within  reach;with bed alarm set;with family/visitor present Nurse Communication: Mobility status PT Visit Diagnosis: Unsteadiness on feet (R26.81);History of falling (Z91.81)     Time: 1610-9604 PT Time Calculation (min) (ACUTE ONLY): 18 min  Charges:  $Therapeutic Activity: 8-22 mins                    G CodesTresa Endo PT [phone]    [name] 08/18/2017, 3:50 PM

## 2017-08-18 NOTE — Progress Notes (Signed)
PROGRESS NOTE    Patient: [name]     PCP: Lillard Anes, MD                    DOB: [birth year]            DOA: 08/14/2017 OXB:[id number]             DOS: 08/18/2017, 12:24 PM   LOS: 3 days   Date of Service: The patient was seen and examined on 08/18/2017  Subjective:  Seen in pt's room, wathcing TV, no c/o's, wife at bedside, no questions.  For surgery this evening around 5 pm.    Brief Narrative:   83 year old male with a history of paroxysmal atrial fibrillation, complete heart block, status post pacemaker placement, history of CVA, hypothyroidism, hypertension, on chronic anticoagulation with Eliquis presented status post accidental fall as he slipped on a rug.  Initial evaluation revealed right hip periprosthetic fracture.  Orthopedic was consulted. Pt was admitted.   Exam: General exam: Appears calm and comfortable  HEENT: WNLs Respiratory system: Clear to auscultation. Respiratory effort normal. Cardiovascular system: S1 & S2 heard, RRR. No JVD, murmurs, rubs, gallops or clicks. No pedal edema. Gastrointestinal system: Abd. nondistended, soft and nontender. No organomegaly or masses felt. Normal bowel sounds heard. Central nervous system: Alert and oriented. No focal neurological deficits. Extremities: Symmetric 5 x 5 power.,  Right hip pain, limited range of movement secondary to pain Skin: No rashes, lesions or ulcers  Home meds: -albuterol hfa/ flovent hfa -ecasa/ ranexa 500 bid/ SL ntg prn/ imdur 60/ statin -eliquis 5 mg bid -norvasc 5qd/ chlorthalidone 25 qd -PPI/ MVI/ vitamins/ supplements   Principal Problem:   Closed fracture of right femur (HCC) Active Problems:   Complete heart block (HCC)   Hypothyroidism   Paroxysmal atrial fibrillation (HCC)   Hip fracture requiring operative repair, right, closed, initial encounter [facility])   Essential hypertension   Hip fracture (HCC)   Assessment & Plan:  1) Right hip periprosthetic fracture status  post mechanical fall  - Has had cardiac cath in August 2018 which showed nonobstructive CAD. Intermediate risk for surgery.  Patient underwent ORIF of R femur fracture by Dr Alvan Dame on 08/17/17. - stable post-op, Hb stable 9.0, up in chair today - per surgery recommendations are    -resume Eliquis upon d/c at full dose (getting 1/2 dose postop now)  -norco for pain (they wrote Rx)  -miralax and colace for constipation, ordered per ortho  -Iron 325 tid for 2-3 wks, ordered per ortho  -PWB 50% on R leg  -dressing to remain in place for 2 wks  -may shower w dressing, it is waterproof  -f/u Dr Alvan Dame in 2 wks  - disposition > seen by P.T.who recommend CIR vs SNF vs home if can provide 2- assist.  Spoke w wife and patient, they have lots of help and prefer to go home.  Plan dc home in 1-2 days when patient stronger.    2) Paroxysmal atrial fibrillation  - resume full Eliquis on dc home (getting 1/2 dose now), per ortho  3) History of complete heart block status PPM  4) Hypertension - cont amlodipine, holding diuretic  5) History of CVA on statins and apixaban.  6) Hypothyroidism on Synthroid.  7) AKI / CKD 3 - baseline 1.2- 1.5, creat down with IVF's.    8) Normocytic normochromic anemia - Hb 9.0, stable   Kelly Splinter MD [place] pgr [phone]  08/17/2017, 2:13 PM   DVT prophylaxis: SCDs. Code Status: Full code. Family Communication: Patient's wife. Disposition Plan:  dc home 1-2 days when stronger Consults called: Orthopedics. Admission status: Inpatient.    Procedures:  - 08/17/17 > ORIF R proximal femur fracture, periprosthetic, Dr Alvan Dame : "... old incision was noted.  I used the distal portion of incision from the area of the tip of the trochanter or the vastus ridge distally. Based on the appearance radiographically, it was all contained within the 12-14 cm of the stem.  Soft tissue dissection was carried down to the iliotibial band, which  was incised.   I then elevated the vastus lateralis off the posterior intermuscular septum.  The cauterization of vessels was carried out as needed.  The fracture site was identified.  After debridement around it, I exposed the extent of the fracture that was visualized.  This correlated with plain film pictures.  I then used a [employer] and passed three cables over the extent of the fracture pattern.  These cables were then tensioned twice and then crimped and cut.  The fracture appeared to be anatomically reduced.  Please note that I used a Lane clamp to reduce the fracture into an anatomic position.  This was done prior to the cables being passed and then tightened down and crimped.  Once this was done, we irrigated the wound.  The vastus lateralis was laid back over top of the fixation and the iliotibial band reapproximated using #1 Vicryl and #1 Stratafix suture.  The remainder of the wound was closed with 2-0 Vicryl and then running Monocryl stitch.  The skin was then cleaned, dried and dressed sterilely using surgical glue and an Aquacel dressing.  He was then brought to the recovery room in stable condition tolerating the procedure well.        It will be important for him to be partial weightbearing for 6-8 weeks to allow for bony union.  I will see him back in the office for 2 weeks for wound check and radiographs.  Discharge per Medical Team."   Antimicrobials:  Anti-infectives (From admission, onward)   Start     Dose/Rate Route Frequency Ordered Stop   08/18/17 0000  ceFAZolin (ANCEF) IVPB 2g/100 mL premix     2 g 200 mL/hr over 30 Minutes Intravenous Every 6 hours 08/17/17 2013 08/18/17 0650   08/17/17 0600  ceFAZolin (ANCEF) IVPB 2g/100 mL premix     2 g 200 mL/hr over 30 Minutes Intravenous On call to O.R. 08/16/17 1351 08/17/17 1738       Objective: Vitals:   08/17/17 2300 08/18/17 0110 08/18/17 0554 08/18/17 0939  BP: (!) 114/52 (!) 117/50 (!) 155/60 (!) 157/61  Pulse:  94 90 88 86  Resp: 16 16 16 18   Temp: 99.5 F (37.5 C) 99 F (37.2 C) 98.6 F (37 C) 97.6 F (36.4 C)  TempSrc: Oral  Oral Oral  SpO2: 95% 94% 97% 99%  Weight:      Height:        Intake/Output Summary (Last 24 hours) at 08/18/2017 1224 Last data filed at 08/18/2017 1005 Gross per 24 hour  Intake 1780 ml  Output 1475 ml  Net 305 ml   Filed Weights   08/14/17 2341 08/17/17 1638  Weight: 83.9 kg (185 lb) 83.9 kg (185 lb)      Data Reviewed: I have personally reviewed following labs and imaging studies  CBC: Recent Labs  Lab 08/15/17 0013 08/16/17  0263 08/17/17 0547 08/18/17 0500  WBC 8.6 7.3 6.3 8.2  NEUTROABS 6.1  --   --   --   HGB 10.9* 10.1* 9.1* 9.0*  HCT 32.8* 31.0* 26.5* 26.5*  MCV 90.1 90.9 91.1 90.8  PLT 233 236 189 785   Basic Metabolic Panel: Recent Labs  Lab 08/15/17 0013 08/16/17 0551 08/17/17 0547 08/18/17 0500  NA 135 134* 134* 133*  K 3.5 4.3 3.9 4.2  CL 103 101 102 101  CO2 22 24 25  21*  GLUCOSE 159* 106* 104* 158*  BUN 22* 24* 18 20  CREATININE 1.82* 1.81* 1.34* 1.32*  CALCIUM 8.9 8.8* 8.5* 8.6*  MG  --   --  1.4*  --    GFR: Estimated Creatinine Clearance: 44.5 mL/min (A) (by C-G formula based on SCr of 1.32 mg/dL (H)). Liver Function Tests: No results for input(s): AST, ALT, ALKPHOS, BILITOT, PROT, ALBUMIN in the last 168 hours. No results for input(s): LIPASE, AMYLASE in the last 168 hours. No results for input(s): AMMONIA in the last 168 hours. Coagulation Profile: Recent Labs  Lab 08/15/17 0013  INR 1.41   Cardiac Enzymes: No results for input(s): CKTOTAL, CKMB, CKMBINDEX, TROPONINI in the last 168 hours. BNP (last 3 results) No results for input(s): PROBNP in the last 8760 hours. HbA1C: No results for input(s): HGBA1C in the last 72 hours. CBG: No results for input(s): GLUCAP in the last 168 hours. Lipid Profile: No results for input(s): CHOL, HDL, LDLCALC, TRIG, CHOLHDL, LDLDIRECT in the last 72 hours. Thyroid  Function Tests: No results for input(s): TSH, T4TOTAL, FREET4, T3FREE, THYROIDAB in the last 72 hours. Anemia Panel: No results for input(s): VITAMINB12, FOLATE, FERRITIN, TIBC, IRON, RETICCTPCT in the last 72 hours. Sepsis Labs: No results for input(s): PROCALCITON, LATICACIDVEN in the last 168 hours.  Recent Results (from the past 240 hour(s))  Surgical PCR screen     Status: None   Collection Time: 08/16/17 11:00 PM  Result Value Ref Range Status   MRSA, PCR NEGATIVE NEGATIVE Final   Staphylococcus aureus NEGATIVE NEGATIVE Final    Comment: (NOTE) The Xpert SA Assay (FDA approved for NASAL specimens in patients 12 years of age and older), is one component of a comprehensive surveillance program. It is not intended to diagnose infection nor to guide or monitor treatment. Performed at [facility], [place] [address] [place], [place] [address]       Radiology Studies: No results found.  Scheduled Meds: . amLODipine  5 mg Oral Daily  . apixaban  2.5 mg Oral BID  . atorvastatin  40 mg Oral Daily  . budesonide  0.5 mg Inhalation BID  . docusate sodium  100 mg Oral BID  . ferrous sulfate  325 mg Oral TID PC  . folic acid  774 mcg Oral Daily  . isosorbide mononitrate  60 mg Oral Daily  . levothyroxine  50 mcg Oral QAC breakfast  . pantoprazole  40 mg Oral Daily  . ranolazine  500 mg Oral BID  . vitamin B-12  250 mcg Oral Daily   Continuous Infusions: . methocarbamol (ROBAXIN)  IV

## 2017-08-19 DIAGNOSIS — S72001A Fracture of unspecified part of neck of right femur, initial encounter for closed fracture: Secondary | ICD-10-CM

## 2017-08-19 DIAGNOSIS — I10 Essential (primary) hypertension: Secondary | ICD-10-CM

## 2017-08-19 DIAGNOSIS — I480 Paroxysmal atrial fibrillation: Secondary | ICD-10-CM

## 2017-08-19 DIAGNOSIS — I442 Atrioventricular block, complete: Secondary | ICD-10-CM

## 2017-08-19 DIAGNOSIS — Z96641 Presence of right artificial hip joint: Secondary | ICD-10-CM

## 2017-08-19 LAB — CBC
HEMATOCRIT: 22.4 % — AB (ref 39.0–52.0)
HEMOGLOBIN: 7.8 g/dL — AB (ref 13.0–17.0)
MCH: 31.1 pg (ref 26.0–34.0)
MCHC: 34.8 g/dL (ref 30.0–36.0)
MCV: 89.2 fL (ref 78.0–100.0)
Platelets: 260 10*3/uL (ref 150–400)
RBC: 2.51 MIL/uL — ABNORMAL LOW (ref 4.22–5.81)
RDW: 12.9 % (ref 11.5–15.5)
WBC: 10 10*3/uL (ref 4.0–10.5)

## 2017-08-19 LAB — BASIC METABOLIC PANEL
Anion gap: 9 (ref 5–15)
BUN: 28 mg/dL — AB (ref 6–20)
CALCIUM: 8.9 mg/dL (ref 8.9–10.3)
CO2: 24 mmol/L (ref 22–32)
CREATININE: 1.21 mg/dL (ref 0.61–1.24)
Chloride: 100 mmol/L — ABNORMAL LOW (ref 101–111)
GFR calc Af Amer: >60 mL/min (ref >60)
GFR, EST NON AFRICAN AMERICAN: 55 mL/min — AB (ref >60)
GLUCOSE: 150 mg/dL — AB (ref 65–99)
Potassium: 4.2 mmol/L (ref 3.5–5.1)
SODIUM: 133 mmol/L — AB (ref 135–145)

## 2017-08-19 LAB — TROPONIN I
TROPONIN I: 0.03 ng/mL — AB (ref <0.03)
Troponin I: <0.03 ng/mL (ref <0.03)

## 2017-08-19 LAB — MAGNESIUM: MAGNESIUM: 1.6 mg/dL — AB (ref 1.7–2.4)

## 2017-08-19 LAB — HEMOGLOBIN AND HEMATOCRIT, BLOOD
HCT: 23.1 % — ABNORMAL LOW (ref 39.0–52.0)
Hemoglobin: 7.9 g/dL — ABNORMAL LOW (ref 13.0–17.0)

## 2017-08-19 MED ORDER — PANTOPRAZOLE SODIUM 40 MG PO TBEC
40.0000 mg | DELAYED_RELEASE_TABLET | Freq: Two times a day (BID) | ORAL | Status: DC
  Administered 2017-08-20: 40 mg via ORAL
  Filled 2017-08-19: qty 1

## 2017-08-19 MED ORDER — MORPHINE SULFATE (PF) 4 MG/ML IV SOLN
2.0000 mg | Freq: Once | INTRAVENOUS | Status: AC
  Administered 2017-08-19: 2 mg via INTRAVENOUS
  Filled 2017-08-19: qty 1

## 2017-08-19 MED ORDER — DEXTROSE 5 % IV SOLN
3.0000 g | Freq: Once | INTRAVENOUS | Status: AC
  Administered 2017-08-19: 3 g via INTRAVENOUS
  Filled 2017-08-19: qty 6

## 2017-08-19 MED ORDER — ASPIRIN 325 MG PO TABS
325.0000 mg | ORAL_TABLET | Freq: Once | ORAL | Status: AC
  Administered 2017-08-19: 325 mg via ORAL
  Filled 2017-08-19: qty 1

## 2017-08-19 MED ORDER — PANTOPRAZOLE SODIUM 40 MG PO TBEC
40.0000 mg | DELAYED_RELEASE_TABLET | Freq: Once | ORAL | Status: AC
  Administered 2017-08-19: 40 mg via ORAL
  Filled 2017-08-19: qty 1

## 2017-08-19 MED ORDER — GI COCKTAIL ~~LOC~~
30.0000 mL | Freq: Once | ORAL | Status: AC
  Administered 2017-08-19: 30 mL via ORAL
  Filled 2017-08-19: qty 30

## 2017-08-19 NOTE — Progress Notes (Signed)
Nurse tech requested nurse to come to patients room because patient is asking for nitroglycerin. Nurse entered room and patient is moaning. Patient explains he is having chest pain and needs nitroglycerin. Patients vital signs as follows: 173/76, 99, 20, 95% room air, chest pain at level 10.  Nurse administered 1 nitroglycerin tablet. Nurse paged Dr. Karleen Hampshire. Per Dr. Karleen Hampshire, order EKG and troponin level and give 2nd nitroglycerin if still having chest pain and blood pressure is elevated. Second set of vital signs as follows: 163/66, 96, 18, 97% room air, chest pain at level 5. Nurse administered second nitroglycerin tablet. 3rd set of vital signs as follows: 118/52, 88, 18, 95% room air, chest pain at level 1. Dr Karleen Hampshire entered the room, requested to give patient Mylanta and recheck vital signs in 1 hour. Dr. Karleen Hampshire has EKG in hand.

## 2017-08-19 NOTE — Progress Notes (Addendum)
Patient complaining of chest pain at level 10 on 0-10 pain scale. Vital signs as follows: 157/74, 106, 20, 98% room air. Patient is requesting nitroglycerin. Nurse paged Dr. Karleen Hampshire. Give one nitroglycerin per Dr. Karleen Hampshire. Dr. Karleen Hampshire is coming to room to see patient.    Per Dr. Karleen Hampshire give priolosec, maalox, EKG, and transfer patient to telemetry.

## 2017-08-19 NOTE — Progress Notes (Signed)
CRITICAL VALUE STICKER  CRITICAL VALUE: Troponin 0.03  RECEIVER (on-site recipient of call):Mishael Haran RN  DATE & TIME NOTIFIED: 08/19/17 2105  MD NOTIFIED: NP [place]   TIME OF NOTIFICATION:2318  RESPONSE: No new orders at this time

## 2017-08-19 NOTE — Progress Notes (Signed)
Physical Therapy Treatment Patient Details Name: [name] MRN: [id number] DOB: 07/03/1934 Today's Date: 08/19/2017    History of Present Illness 83 yo male  admitted 08/14/17 after fall at home, sustained  right femur periprosthetic fracture, S/P OPEN REDUCTION INTERNAL FIXATION (ORIF) PERIPROSTHETIC FRACTURE RIGHT FEMUR on 07/2717. H/O stroke, PPM, BTHA, Left femur periprosthetic fracture with cabling.     PT Comments    The patient had episode of chest pian requiring nitro and MD to assess earlier in the day. RN gave clearance to dangle with patient at the bed edge. The patient requires max assist of 2 persons for mobility. . Patient tolerated well. Patient and wife confirm that the plan is for patient to Dc to home. No other family present. Continue PT.   Follow Up Recommendations  Home health PT;SNF     Equipment Recommendations  None recommended by PT    Recommendations for Other Services       Precautions / Restrictions Precautions Precautions: Fall Precaution Comments: KI for comfort Required Braces or Orthoses: Knee Immobilizer - Right Restrictions RLE Weight Bearing: Partial weight bearing RLE Partial Weight Bearing Percentage or Pounds: 50    Mobility  Bed Mobility Overal bed mobility: Needs Assistance Bed Mobility: Supine to Sit;Sit to Supine     Supine to sit: Max assist;+2 for physical assistance;+2 for safety/equipment;HOB elevated Sit to supine: Total assist;+2 for physical assistance;+2 for safety/equipment   General bed mobility comments: assist with both legs and trunk back into bed,BP sitting 148/53, SATS 100%, HR 78 no chest pain  Transfers                 General transfer comment: did not stand today due to episode of chest pain in AM and  HGB 7.8. Work up is ongoing.  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 Exercises - Lower Extremity Long Arc Quad: AAROM;Right;20 reps;Seated    General Comments        Pertinent Vitals/Pain Pain Score: 4  Pain Location: right leg Pain Descriptors / Indicators: Discomfort;Operative site guarding Pain Intervention(s): Monitored during session;RN gave pain meds during session    Home Living                      Prior Function            PT Goals (current goals can now be found in the care plan section) Progress towards PT goals: Progressing toward goals    Frequency    Min 5X/week      PT Plan Current plan remains appropriate    Co-evaluation              AM-PAC PT "6 Clicks" Daily Activity  Outcome Measure  Difficulty turning over in bed (including adjusting bedclothes, sheets and blankets)?: Unable Difficulty moving from lying on back to sitting on the side of the bed? : Unable Difficulty sitting down on and standing up from a chair with arms (e.g., wheelchair, bedside commode, etc,.)?: Unable Help needed moving to and from a bed to chair (including a wheelchair)?: Total Help needed walking in hospital room?: Total Help needed climbing 3-5 steps with a railing? : Total 6 Click Score: 6    End of Session   Activity Tolerance: Patient tolerated treatment well Patient left: in bed;with call bell/phone within reach;with bed alarm set;with family/visitor present Nurse Communication: Mobility status PT Visit Diagnosis: Unsteadiness on feet (R26.81);History of falling (Z91.81)     Time: 6767-2094 PT Time Calculation (min) (ACUTE ONLY): 33 min  Charges:  $Therapeutic Activity: 23-37 mins                    G CodesTresa Endo PT [phone]  [name] 08/19/2017, 5:03 PM

## 2017-08-19 NOTE — Progress Notes (Signed)
Pt complained of 10/10 chest pain radiating to his jaw with SOB. NP on call notified. EKG obtained. O2 stats at 97 on room air. Sublingual nitro given at 2139. Patient stated that the nitro brought his pain down to a 5/10. Second nitro given 2144 and brought pain down to a 2/10. NP placed order for one time dose IV morphine. IV morphine given at 2149. Pt stated his pain 0/10 after morphine. Troponin drawn. No chest pain at this time and patient is no longer SOB. Will continue to monitor closely.

## 2017-08-19 NOTE — Progress Notes (Signed)
PROGRESS NOTE    [name]  LXB:[id number] DOB: 1935/03/02 DOA: 08/14/2017 PCP: Lillard Anes, MD    Brief Narrative:  83 year old male with a history of paroxysmal atrial fibrillation, complete heart block, status post pacemaker placement, history of CVA, hypothyroidism, hypertension, on chronic anticoagulation with Eliquis presented status post accidental fall as he slipped on a rug.  Initial evaluation revealed right hip periprosthetic fracture.  Orthopedic was consulted.he underwent ORIF of the right femur by Dr Alvan Dame on 2/27.     Assessment & Plan:   Principal Problem:   Closed fracture of right femur ([place]) Active Problems:   Complete heart block (HCC)   Hypothyroidism   Paroxysmal atrial fibrillation (HCC)   Hip fracture requiring operative repair, right, closed, initial encounter [facility])   Essential hypertension   Hip fracture ([place])   Right hip periprosthetic fracture status post mechanical fall Patient underwent ORIF of R femur fracture by Dr Alvan Dame on 08/17/17. Pain control and physical therapy.  pts hemoglobin dropped to 7.9, post op,transfuse to keep hemoglobin greater than 7.  Resume eliquis and continue with PWB on the right leg as recommended by orthopedics.  Stool softners . Follow up with orthopedics as recommended outpatient in 2 weeks.    Accessible atrial fibrillation Rate controlled on Eliquis for anticoagulation.   History of complete heart block status post PPM.  Hypertension continue with amlodipine.  And Eliquis.   Acute on chronic stage III kidney disease. Baseline creatinine 1.2 currently creatinine is back at baseline.   Acute anemia of blood loss secondary to surgery Hemoglobin baseline around 9 currently is at 7.9 transfuse to keep hemoglobin greater than 7.   Sternal burning sensation/substernal chest pain Nonradiating , occurs at rest associated with food.  Patient reports it feels like heartburn but requesting  nitroglycerin for relief.  Initial troponin negative second troponin is borderline elevated. EKG on 2 occasions similar to baseline.  No signs of ischemic changes. Patient also reports relief of the substernal burning with Mylanta and Protonix. Transfer the patient to telemetry for closer monitoring. Patient had cart left heart cardiac catheterization in August 2018 which shows nonobstructive CAD, but in view of recurrent chest pain will request cardiology evaluation in the morning. Follow troponins.  Increase Protonix to twice daily, GI cocktail 1 dose.   DVT prophylaxis: eliquis.  Code Status: full code.  Family Communication: none at bedside.  Disposition Plan: transfer to telemetry.   Consultants:   Orthopedics.   Procedures: ORIF of R femur fracture by Dr Alvan Dame on 08/17/17.      Antimicrobials: none.    Subjective: Reports burning sensation substernal, non radiating. Occurred when eating dinner and breakfast this am. Relieved after nitroglycerin.   Objective: Vitals:   08/19/17 1402 08/19/17 1429 08/19/17 1631 08/19/17 1643  BP: 126/62 135/68 (!) 157/74 (!) 148/53  Pulse: 86 92 (!) 106 95  Resp: 16  20 18   Temp: 98 F (36.7 C)     TempSrc: Oral     SpO2: 97% 99% 98% 96%  Weight:      Height:        Intake/Output Summary (Last 24 hours) at 08/19/2017 1700 Last data filed at 08/19/2017 1320 Gross per 24 hour  Intake 660 ml  Output 1550 ml  Net -890 ml   Filed Weights   08/14/17 2341 08/17/17 1638  Weight: 83.9 kg (185 lb) 83.9 kg (185 lb)    Examination:  General exam: elderly gentleman without any distress.  Respiratory system:  Clear to auscultation. Respiratory effort normal. No wheezing or rhonchi.  Cardiovascular system: S1 & S2 heard, RRR. No JVD,  No pedal edema. Gastrointestinal system: Abdomen is nondistended, soft and nontender. No organomegaly or masses felt. Normal bowel sounds heard. Central nervous system: Alert and oriented to place and person,  non focal. Very hard of hearing.  Extremities: Symmetric 5 x 5 power. Skin: No rashes, lesions or ulcers Psychiatry:  Mood & affect appropriate.     Data Reviewed: I have personally reviewed following labs and imaging studies  CBC: Recent Labs  Lab 08/15/17 0013 08/16/17 0551 08/17/17 0547 08/18/17 0500 08/19/17 0528 08/19/17 1045  WBC 8.6 7.3 6.3 8.2 10.0  --   NEUTROABS 6.1  --   --   --   --   --   HGB 10.9* 10.1* 9.1* 9.0* 7.8* 7.9*  HCT 32.8* 31.0* 26.5* 26.5* 22.4* 23.1*  MCV 90.1 90.9 91.1 90.8 89.2  --   PLT 233 236 189 224 260  --    Basic Metabolic Panel: Recent Labs  Lab 08/15/17 0013 08/16/17 0551 08/17/17 0547 08/18/17 0500 08/19/17 1045  NA 135 134* 134* 133* 133*  K 3.5 4.3 3.9 4.2 4.2  CL 103 101 102 101 100*  CO2 22 24 25  21* 24  GLUCOSE 159* 106* 104* 158* 150*  BUN 22* 24* 18 20 28*  CREATININE 1.82* 1.81* 1.34* 1.32* 1.21  CALCIUM 8.9 8.8* 8.5* 8.6* 8.9  MG  --   --  1.4*  --  1.6*   GFR: Estimated Creatinine Clearance: 48.6 mL/min (by C-G formula based on SCr of 1.21 mg/dL). Liver Function Tests: No results for input(s): AST, ALT, ALKPHOS, BILITOT, PROT, ALBUMIN in the last 168 hours. No results for input(s): LIPASE, AMYLASE in the last 168 hours. No results for input(s): AMMONIA in the last 168 hours. Coagulation Profile: Recent Labs  Lab 08/15/17 0013  INR 1.41   Cardiac Enzymes: Recent Labs  Lab 08/19/17 1045  TROPONINI <0.03   BNP (last 3 results) No results for input(s): PROBNP in the last 8760 hours. HbA1C: No results for input(s): HGBA1C in the last 72 hours. CBG: No results for input(s): GLUCAP in the last 168 hours. Lipid Profile: No results for input(s): CHOL, HDL, LDLCALC, TRIG, CHOLHDL, LDLDIRECT in the last 72 hours. Thyroid Function Tests: No results for input(s): TSH, T4TOTAL, FREET4, T3FREE, THYROIDAB in the last 72 hours. Anemia Panel: No results for input(s): VITAMINB12, FOLATE, FERRITIN, TIBC, IRON,  RETICCTPCT in the last 72 hours. Sepsis Labs: No results for input(s): PROCALCITON, LATICACIDVEN in the last 168 hours.  Recent Results (from the past 240 hour(s))  Surgical PCR screen     Status: None   Collection Time: 08/16/17 11:00 PM  Result Value Ref Range Status   MRSA, PCR NEGATIVE NEGATIVE Final   Staphylococcus aureus NEGATIVE NEGATIVE Final    Comment: (NOTE) The Xpert SA Assay (FDA approved for NASAL specimens in patients 45 years of age and older), is one component of a comprehensive surveillance program. It is not intended to diagnose infection nor to guide or monitor treatment. Performed at [facility], [place] [address], [place], [place] [address]          Radiology Studies: No results found.      Scheduled Meds: . amLODipine  5 mg Oral Daily  . apixaban  2.5 mg Oral BID  . atorvastatin  40 mg Oral Daily  . docusate sodium  100 mg Oral BID  .  ferrous sulfate  325 mg Oral TID PC  . folic acid  244 mcg Oral Daily  . isosorbide mononitrate  60 mg Oral Daily  . levothyroxine  50 mcg Oral QAC breakfast  . [START ON 08/20/2017] pantoprazole  40 mg Oral BID  . ranolazine  500 mg Oral BID  . vitamin B-12  250 mcg Oral Daily   Continuous Infusions: . magnesium sulfate 1 - 4 g bolus IVPB 3 g (08/19/17 1626)  . methocarbamol (ROBAXIN)  IV       LOS: 4 days    Time spent: 35 minutes    Hosie Poisson, MD [facility] Pager [phone]  If 7PM-7AM, please contact night-coverage [email or web address] Password [email or web address] 08/19/2017, 5:00 PM

## 2017-08-19 NOTE — Progress Notes (Signed)
     Subjective: 2 Days Post-Op Procedure(s) (LRB): OPEN REDUCTION INTERNAL FIXATION (ORIF) PERIPROSTHETIC FRACTURE RIGHT FEMUR (Right)   Patient reports pain as mild, pain controlled. Dressing was changed yesterday and hold well now.  He feel that he is doing well.  No events throughout the night.   Objective:   VITALS:   Vitals:   08/18/17 2119 08/19/17 0639  BP: (!) 153/52 (!) 139/54  Pulse: 85 78  Resp: 17 16  Temp: 97.6 F (36.4 C) 98 F (36.7 C)  SpO2: 99% 98%    Dorsiflexion/Plantar flexion intact Incision: dressing C/Ruiz/I No cellulitis present Compartment soft  LABS Recent Labs    08/17/17 0547 08/18/17 0500 08/19/17 0528  HGB 9.1* 9.0* 7.8*  HCT 26.5* 26.5* 22.4*  WBC 6.3 8.2 10.0  PLT 189 224 260    Recent Labs    08/17/17 0547 08/18/17 0500  NA 134* 133*  K 3.9 4.2  BUN 18 20  CREATININE 1.34* 1.32*  GLUCOSE 104* 158*     Assessment/Plan: 2 Days Post-Op Procedure(s) (LRB): OPEN REDUCTION INTERNAL FIXATION (ORIF) PERIPROSTHETIC FRACTURE RIGHT FEMUR (Right) KI as needed for comfort PT to work with patient Medical service caring for patient, we will continue to follow   Ortho recommendations:  Resume home Eliquis upon Ruiz/c for anticoagulation, unless other medically indicated.  Norco for pain management (Rx written).  MiraLax and Colace for constipation  Iron 325 mg tid for 2-3 weeks   PWB 50% on the right leg.  Dressing to remain in place until follow in clinic in 2 weeks.  Dressing is waterproof and may shower with it in place.  Follow up in 2 weeks at [facility]. Follow up with OLIN,[name] in 2 weeks.  Contact information:  [facility] [address], [address] [name]   PAC  08/19/2017, 7:24 AM

## 2017-08-19 NOTE — Progress Notes (Addendum)
Patient transferred from [place], VSS, tele applied. Denies any chest pain at this time. Will continue to monitor closely.

## 2017-08-20 DIAGNOSIS — S7291XA Unspecified fracture of right femur, initial encounter for closed fracture: Secondary | ICD-10-CM

## 2017-08-20 DIAGNOSIS — E039 Hypothyroidism, unspecified: Secondary | ICD-10-CM

## 2017-08-20 DIAGNOSIS — R0789 Other chest pain: Secondary | ICD-10-CM

## 2017-08-20 LAB — BASIC METABOLIC PANEL
ANION GAP: 10 (ref 5–15)
BUN: 28 mg/dL — AB (ref 6–20)
CO2: 27 mmol/L (ref 22–32)
Calcium: 9 mg/dL (ref 8.9–10.3)
Chloride: 101 mmol/L (ref 101–111)
Creatinine, Ser: 1.23 mg/dL (ref 0.61–1.24)
GFR, EST NON AFRICAN AMERICAN: 54 mL/min — AB (ref >60)
Glucose, Bld: 112 mg/dL — ABNORMAL HIGH (ref 65–99)
POTASSIUM: 4 mmol/L (ref 3.5–5.1)
SODIUM: 138 mmol/L (ref 135–145)

## 2017-08-20 LAB — TROPONIN I: TROPONIN I: 0.03 ng/mL — AB (ref <0.03)

## 2017-08-20 LAB — HEMOGLOBIN AND HEMATOCRIT, BLOOD
HEMATOCRIT: 23.8 % — AB (ref 39.0–52.0)
Hemoglobin: 8 g/dL — ABNORMAL LOW (ref 13.0–17.0)

## 2017-08-20 MED ORDER — ISOSORBIDE MONONITRATE ER 60 MG PO TB24
90.0000 mg | ORAL_TABLET | Freq: Every day | ORAL | Status: DC
  Administered 2017-08-20: 90 mg via ORAL
  Filled 2017-08-20: qty 1

## 2017-08-20 MED ORDER — OMEPRAZOLE MAGNESIUM 20 MG PO TBEC: 40.0000 mg | DELAYED_RELEASE_TABLET | Freq: Every day | ORAL | 0 refills | Status: DC

## 2017-08-20 MED ORDER — DOCUSATE SODIUM 100 MG PO CAPS: 100.0000 mg | ORAL_CAPSULE | Freq: Two times a day (BID) | ORAL | 0 refills | Status: DC

## 2017-08-20 MED ORDER — FERROUS SULFATE 325 (65 FE) MG PO TABS: 325.0000 mg | ORAL_TABLET | Freq: Three times a day (TID) | ORAL | 3 refills | Status: DC

## 2017-08-20 MED ORDER — ISOSORBIDE MONONITRATE ER 30 MG PO TB24: 90.0000 mg | ORAL_TABLET | Freq: Every day | ORAL | 0 refills | Status: DC

## 2017-08-20 NOTE — Consult Note (Signed)
Cardiology Consultation:   Patient ID: [name]; [id number]; 24-Sep-1934   Admit date: 08/14/2017 Date of Consult: 08/20/2017  Primary Care Provider: Lillard Anes, MD Primary Cardiologist: [name] Primary Electrophysiologist:  n/a   Patient Profile:   [name] is a 83 y.o. male with a hx of heart block with pacemaker and carotid stenosis who is being seen today for the evaluation of chest pain at the request of Dr [name].  History of Present Illness:   [name] 83 yo male history of heart block with PPM, PAF, carotid stenosis, and prior chest pain with negative nuclear stress test 09/2015 and cath 01/2017 with nonobstructive CAD admitted 08/15/17 with mechanical fall. Managed this admission for hip fracture s/p surgery 08/17/17. Cardiology consulted for chest pain  Description of symptoms are inconsistent. From primary team note he described pain associated with food, feeling like heartburn better with mylanta and protonix. To me he reports no association and no improvement with antacids, better with NG. Symptoms are similar to his symptoms last year when he had a cath.     01/2017 cath: LAD prox 40%, mid LAD 50%, RCA 60%, LPDA 60% 01/2017 echo: LVEF 50-55%, mild MR, mild TR Trop neg-->0.03-->0.03 CXR no acute process EKG v-paced  Past Medical History:  Diagnosis Date  . Asthma   . Hypertension   . Peripheral vascular disease [facility])     Past Surgical History:  Procedure Laterality Date  . CATARACT EXTRACTION    . HIP SURGERY    . INSERT / REPLACE / REMOVE PACEMAKER     [employer]  . LEFT HEART CATH AND CORONARY ANGIOGRAPHY N/A 02/08/2017   Procedure: LEFT HEART CATH AND CORONARY ANGIOGRAPHY;  Surgeon: Martinique, Peter M, MD;  Location: [facility];  Service: Cardiovascular;  Laterality: N/A;  . ORIF PERIPROSTHETIC FRACTURE Right 08/17/2017   Procedure: OPEN REDUCTION INTERNAL FIXATION (ORIF) PERIPROSTHETIC FRACTURE RIGHT FEMUR;  Surgeon: Scott Cancel,  MD;  Location: [facility];  Service: Orthopedics;  Laterality: Right;       Inpatient Medications: Scheduled Meds: . amLODipine  5 mg Oral Daily  . apixaban  2.5 mg Oral BID  . atorvastatin  40 mg Oral Daily  . docusate sodium  100 mg Oral BID  . ferrous sulfate  325 mg Oral TID PC  . folic acid  416 mcg Oral Daily  . isosorbide mononitrate  60 mg Oral Daily  . levothyroxine  50 mcg Oral QAC breakfast  . pantoprazole  40 mg Oral BID  . ranolazine  500 mg Oral BID  . vitamin B-12  250 mcg Oral Daily   Continuous Infusions: . methocarbamol (ROBAXIN)  IV     PRN Meds: albuterol, alum & mag hydroxide-simeth, bisacodyl, HYDROcodone-acetaminophen, HYDROmorphone (DILAUDID) injection, magnesium citrate, menthol-cetylpyridinium **OR** phenol, methocarbamol **OR** methocarbamol (ROBAXIN)  IV, metoCLOPramide **OR** metoCLOPramide (REGLAN) injection, nitroGLYCERIN, ondansetron **OR** ondansetron (ZOFRAN) IV, polyethylene glycol  Allergies:   No Known Allergies  Social History:   Social History   Socioeconomic History  . Marital status: Married    Spouse name: Not on file  . Number of children: Not on file  . Years of education: Not on file  . Highest education level: Not on file  Social Needs  . Financial resource strain: Not on file  . Food insecurity - worry: Not on file  . Food insecurity - inability: Not on file  . Transportation needs - medical: Not on file  . Transportation needs - non-medical: Not on file  Occupational History  . Not on file  Tobacco Use  . Smoking status: Never Smoker  . Smokeless tobacco: Never Used  Substance and Sexual Activity  . Alcohol use: Yes  . Drug use: No  . Sexual activity: Not on file  Other Topics Concern  . Not on file  Social History Narrative  . Not on file    Family History:    Family History  Problem Relation Age of Onset  . Heart attack Mother      ROS:  Please see the history of present illness.  All other ROS reviewed and  negative.     Physical Exam/Data:   Vitals:   08/19/17 1643 08/19/17 1751 08/19/17 2132 08/20/17 0601  BP: (!) 148/53 133/61 (!) 158/68 (!) 138/54  Pulse: 95 83 (!) 108 77  Resp: 18 18 18 18   Temp:  98.2 F (36.8 C) (!) 97.4 F (36.3 C) 98.1 F (36.7 C)  TempSrc:  Oral Oral Oral  SpO2: 96% 98% 98% 98%  Weight:      Height:        Intake/Output Summary (Last 24 hours) at 08/20/2017 0800 Last data filed at 08/20/2017 0601 Gross per 24 hour  Intake 586 ml  Output 2075 ml  Net -1489 ml   Filed Weights   08/14/17 2341 08/17/17 1638  Weight: 185 lb (83.9 kg) 185 lb (83.9 kg)   Body mass index is 30.79 kg/Ruiz.  General:  Well nourished, well developed, in no acute distress HEENT: normal Lymph: no adenopathy Neck: no JVD Endocrine:  No thryomegaly Vascular: No carotid bruits; FA pulses 2+ bilaterally without bruits  Cardiac:  normal S1, S2; RRR; no murmur  Lungs:  clear to auscultation bilaterally, no wheezing, rhonchi or rales  Abd: soft, nontender, no hepatomegaly  Ext: no edema Musculoskeletal:  No deformities, BUE and BLE strength normal and equal Skin: warm and dry  Neuro:  CNs 2-12 intact, no focal abnormalities noted Psych:  Normal affect      Laboratory Data:  Chemistry Recent Labs  Lab 08/17/17 0547 08/18/17 0500 08/19/17 1045  NA 134* 133* 133*  K 3.9 4.2 4.2  CL 102 101 100*  CO2 25 21* 24  GLUCOSE 104* 158* 150*  BUN 18 20 28*  CREATININE 1.34* 1.32* 1.21  CALCIUM 8.5* 8.6* 8.9  GFRNONAA 48* 49* 55*  GFRAA 56* 57* >60  ANIONGAP 7 11 9     No results for input(s): PROT, ALBUMIN, AST, ALT, ALKPHOS, BILITOT in the last 168 hours. Hematology Recent Labs  Lab 08/17/17 0547 08/18/17 0500 08/19/17 0528 08/19/17 1045  WBC 6.3 8.2 10.0  --   RBC 2.91* 2.92* 2.51*  --   HGB 9.1* 9.0* 7.8* 7.9*  HCT 26.5* 26.5* 22.4* 23.1*  MCV 91.1 90.8 89.2  --   MCH 31.3 30.8 31.1  --   MCHC 34.3 34.0 34.8  --   RDW 12.7 12.6 12.9  --   PLT 189 224 260  --      Cardiac Enzymes Recent Labs  Lab 08/19/17 1045 08/19/17 1608 08/19/17 2153  TROPONINI <0.03 0.03* 0.03*   No results for input(s): TROPIPOC in the last 168 hours.  BNPNo results for input(s): BNP, PROBNP in the last 168 hours.  DDimer No results for input(s): DDIMER in the last 168 hours.  Radiology/Studies:  No results found.  Assessment and Plan:   1. Chest pain - long history of chest pain, patient is on imdur and ranexa at home. Nonobstructive  cath 2018. - patient provides inconsistent description of symptoms. To primary team symptosm seem to be related to food, better with antacid. To me he denies any relation to food or improvement with antacids, better with NG.  - symptoms are similar to prior symptoms 1 year ago - EKG is v-paced and not interpretable for ischemia. Flat troponin just above the level of detection not conistent with ACS in setting of anemia, CKD recent surgery.  - cath 2018 with moderate nonobstructive disease.   - overall inconsistent story in describing his chest pain, symptoms overall are not overally conistent with cardiac etiology. Troponin trend not consistent with ACS. Will obtain echo to rule out any new cardiac dysfunction, increase long acting nitrate since better with NG. Antacid therapy has been intensified by primary team. No plans for repeat ischemic testing at this time.    For questions or updates, please contact [place] Please consult [email or web address] for contact info under Cardiology/STEMI.   Merrily Pew, MD  08/20/2017 8:00 AM

## 2017-08-20 NOTE — Progress Notes (Signed)
Paged on call Echo, [name], to update ECHO for imminent discharge.  No call back. [name] insisting to go home. Page MD. MD Karleen Hampshire with order to dc [name]. MD will follow up on outpatient echo. Case manager ok to dc [name]. DC teaching done using teachback method. [name] discharged with no apparent signs distress. Spouse picked up [name] in SUV. Advised against transfer to SUV because [name] is 50% weight bearing to right leg. [name] and wife state that they have done this before and they are ok to go home. [name] was 2 person max assist to get into car. [name] stated repeatedly that he has multiple people to assist him out of care into house when he gets home.

## 2017-08-20 NOTE — Progress Notes (Signed)
PT Cancellation Note  Patient Details Name: [name] MRN: [id number] DOB: 09/10/1934   Cancelled Treatment:    Reason Eval/Treat Not Completed: Medical issues which prohibited therapy. Another episode of Chest pain overnight requiring  Transfer to tele. Will resume PT when medically stable.   [name] 08/20/2017, 7:47 AM [name] PT [phone]

## 2017-08-20 NOTE — Progress Notes (Signed)
MD Karleen Hampshire with verbal order to change ECHO to imminent discharge. Order carried out. Paged Echo to update

## 2017-08-20 NOTE — Care Management Note (Signed)
Case Management Note  Patient Details  Name: [name] MRN: [id number] Date of Birth: 1935-05-06  Subjective/Objective:    Chest pain                Action/Plan: Spoke to pt and wife. Offered choice for [facility]. Pt states he had HH in the past. Agreeable to [facility] for [facility], PT and aide. Contacted [facility] with new referral. Pt has RW, bedside commode, lift chair, and crutches at home.    Expected Discharge Date:  08/20/17               Expected Discharge Plan:  [place]  In-House Referral:  NA  Discharge planning Services  CM Consult  Post Acute Care Choice:  Home Health Choice offered to:  Patient, Spouse  DME Arranged:  N/A DME Agency:  NA  HH Arranged:  RN, PT, Social Work [employer] Agency:  [place]  Status of Service:  Completed, signed off  If discussed at [employer], dates discussed:    Additional Comments:  Erenest Rasher, RN 08/20/2017, 2:15 PM

## 2017-08-21 NOTE — Discharge Summary (Signed)
Physician Discharge Summary  [name] KNL:[id number] DOB: [birth year] DOA: 08/14/2017  PCP: Lillard Anes, MD  Admit date: 08/14/2017 Discharge date: 08/20/2017  Admitted From: Home.  Disposition: Home   Recommendations for Outpatient Follow-up:  1. Follow up with PCP in 1-2 weeks 2. Please obtain BMP/CBC in one week 3. Please follow up with cardiology in one week for a repeat echocardiogram.   Home Health: yes.    Discharge Condition:guarded.  CODE STATUS:full code.  Diet recommendation: Heart Healthy    Brief/Interim Summary: 83 year old male with a history of paroxysmal atrial fibrillation, complete heart block, status post pacemaker placement, history of CVA, hypothyroidism, hypertension, on chronic anticoagulation with Eliquis presented status post accidental fall as he slipped on a rug. Initial evaluation revealed right hip periprosthetic fracture. Orthopedic was consulted.he underwent ORIF of the right femur by Dr Alvan Dame on 2/27. on 3/1 pt started having sub sternal burning sensation, relieved by nitro and antacids. EKG's did not show any ischemic changes. Troponin were flat and cardiology consult recmmended no further ischemic work up at this time.  Echocardiogram ordered, but did not want to get it done inpatient, and wanted to follow up with cardiology as outpatient.     Discharge Diagnoses:  Principal Problem:   Closed fracture of right femur [facility]) Active Problems:   Complete heart block (HCC)   Hypothyroidism   Paroxysmal atrial fibrillation (HCC)   Hip fracture requiring operative repair, right, closed, initial encounter [facility])   Essential hypertension   Hip fracture ([place])    Right hip periprosthetic fracture status post mechanical fall Patient underwent ORIF of R femur fracture by Dr Alvan Dame on 08/17/17. Pain control and physical therapy.  Resume eliquis and continue with PWB on the right leg as recommended by orthopedics.  Stool softners . Follow up  with orthopedics as recommended outpatient in 2 weeks.     Accessible atrial fibrillation Rate controlled on Eliquis for anticoagulation.   History of complete heart block status post PPM.  Hypertension continue with amlodipine.     Acute on chronic stage III kidney disease. Baseline creatinine 1.2 currently creatinine is back at baseline.   Acute anemia of blood loss secondary to surgery Hemoglobin baseline around 9 currently is at 8.  transfuse to keep hemoglobin greater than 7.   Sternal burning sensation/substernal chest pain Nonradiating , occurs at rest associated with food.  Patient reports it feels like heartburn but requesting nitroglycerin for relief.  Initial troponin negative second troponin is borderline elevated. EKG on 2 occasions similar to baseline.  No signs of ischemic changes. Patient also reports relief of the substernal burning with Mylanta and Protonix. Transferred the patient to telemetry for closer monitoring. Patient had cart left heart cardiac catheterization in August 2018 which shows nonobstructive CAD, but in view of recurrent chest pain , requested cardiology, who recommended no further ischemic work up at this time.  His pain appears to have resolved with protonix and GI cocktail.   Discharge Instructions  Discharge Instructions    Diet - low sodium heart healthy   Complete by:  As directed    Partial weight bearing   Complete by:  As directed    % Body Weight:  50   Laterality:  right   Extremity:  Lower     Allergies as of 08/20/2017   No Known Allergies     Medication List    TAKE these medications   albuterol 108 (90 Base) MCG/ACT inhaler Commonly known as:  PROVENTIL  HFA;VENTOLIN HFA Inhale 2 puffs into the lungs 4 (four) times daily as needed for wheezing or shortness of breath.   amLODipine 5 MG tablet Commonly known as:  NORVASC TAKE 1 TABLET(5 MG) BY MOUTH DAILY   apixaban 5 MG Tabs tablet Commonly known as:   ELIQUIS Take 1 tablet (5 mg total) by mouth 2 (two) times daily.   aspirin EC 325 MG tablet Take 325 mg by mouth daily. What changed:  Another medication with the same name was removed. Continue taking this medication, and follow the directions you see here.   atorvastatin 40 MG tablet Commonly known as:  LIPITOR Take 40 mg by mouth daily.   CALCIUM 500 + D PO Take 1 tablet by mouth daily.   chlorthalidone 25 MG tablet Commonly known as:  HYGROTON Take 1 tablet (25 mg total) by mouth daily.   docusate sodium 100 MG capsule Commonly known as:  COLACE Take 1 capsule (100 mg total) by mouth 2 (two) times daily.   ferrous sulfate 325 (65 FE) MG tablet Take 1 tablet (325 mg total) by mouth 3 (three) times daily after meals.   fluticasone 110 MCG/ACT inhaler Commonly known as:  FLOVENT HFA Inhale 2 puffs into the lungs 2 (two) times daily.   folic acid 416 MCG tablet Commonly known as:  FOLVITE Take 400 mcg by mouth daily.   HYDROcodone-acetaminophen 5-325 MG tablet Commonly known as:  NORCO/VICODIN Take 1-2 tablets by mouth every 4 (four) hours as needed for moderate pain or severe pain.   isosorbide mononitrate 30 MG 24 hr tablet Commonly known as:  IMDUR Take 3 tablets (90 mg total) by mouth daily. What changed:    medication strength  See the new instructions.   levothyroxine 50 MCG tablet Commonly known as:  SYNTHROID, LEVOTHROID Take 50 mcg by mouth daily before breakfast.   methocarbamol 500 MG tablet Commonly known as:  ROBAXIN Take 1 tablet (500 mg total) by mouth every 6 (six) hours as needed for muscle spasms.   MULTIVITAMIN PO Take 1 tablet by mouth daily.   nitroGLYCERIN 0.4 MG SL tablet Commonly known as:  NITROSTAT Place 1 tablet (0.4 mg total) under the tongue every 5 (five) minutes as needed for chest pain.   omeprazole 20 MG tablet Commonly known as:  PRILOSEC OTC Take 2 tablets (40 mg total) by mouth daily. What changed:  how much to  take   ranolazine 500 MG 12 hr tablet Commonly known as:  RANEXA Take 1 tablet (500 mg total) by mouth 2 (two) times daily.   vitamin B-12 250 MCG tablet Commonly known as:  CYANOCOBALAMIN Take 250 mcg by mouth daily.            Discharge Care Instructions  (From admission, onward)        Start     Ordered   08/17/17 0000  Partial weight bearing    Question Answer Comment  % Body Weight 50   Laterality right   Extremity Lower      08/17/17 1918     Follow-up Information    Paralee Cancel, MD. Schedule an appointment as soon as possible for a visit in 2 week(s).   Specialty:  Orthopedic Surgery Contact information: [address] [place] [address] [phone]        [facility], [facility]-Home Follow up.   Specialty:  Home Health Services Why:  Home Health RN, Physical Therapy, Occupational Therapy, Social Worker, aide. agency will call  to arrange initial visit Contact information: [address] [place] [address] [phone]          No Known Allergies  Consultations:  Cardiology  Orthopedics.    Procedures/Studies: Dg Chest 1 View  Result Date: 08/15/2017 CLINICAL DATA:  Fall, right leg pain EXAM: CHEST 1 VIEW COMPARISON:  02/07/2017 FINDINGS: Left pacer remains in place, unchanged. Cardiomegaly. Aortic atherosclerosis. No confluent airspace opacities, effusions or edema. No pneumothorax. No acute bony abnormality. IMPRESSION: Cardiomegaly.  No active disease. Electronically Signed   By: Rolm Baptise M.D.   On: 08/15/2017 01:00   Dg Pelvis 1-2 Views  Result Date: 08/15/2017 CLINICAL DATA:  Fall.  Right leg pain. EXAM: PELVIS - 1-2 VIEW COMPARISON:  Right femur series performed today FINDINGS: Bilateral replacements. The proximal femoral shaft fracture seen on the femoral series cannot be visualized on this pelvis image. No acute bony abnormality seen. No subluxation or dislocation. IMPRESSION: Bilateral hip  replacements. No acute bony abnormality. See right femur series for further findings. Electronically Signed   By: Rolm Baptise M.D.   On: 08/15/2017 01:00   Dg Femur Min 2 Views Right  Result Date: 08/15/2017 CLINICAL DATA:  Fall, right leg pain EXAM: RIGHT FEMUR 2 VIEWS COMPARISON:  None. FINDINGS: Changes of prior right hip replacement. There is a fracture noted through the proximal femoral shaft about the femoral component. Mildly displaced fracture fragments. No additional acute bony abnormality. IMPRESSION: Proximal right femoral shaft fracture around the shaft component of the right hip replacement. Electronically Signed   By: Rolm Baptise M.D.   On: 08/15/2017 01:01       Subjective: Wants to go home, denies any pain or any complaints.   Discharge Exam: Vitals:   08/19/17 2132 08/20/17 0601  BP: (!) 158/68 (!) 138/54  Pulse: (!) 108 77  Resp: 18 18  Temp: (!) 97.4 F (36.3 C) 98.1 F (36.7 C)  SpO2: 98% 98%   Vitals:   08/19/17 1643 08/19/17 1751 08/19/17 2132 08/20/17 0601  BP: (!) 148/53 133/61 (!) 158/68 (!) 138/54  Pulse: 95 83 (!) 108 77  Resp: 18 18 18 18   Temp:  98.2 F (36.8 C) (!) 97.4 F (36.3 C) 98.1 F (36.7 C)  TempSrc:  Oral Oral Oral  SpO2: 96% 98% 98% 98%  Weight:      Height:        General: Pt is alert, awake, not in acute distress Cardiovascular: RRR, S1/S2 +, no rubs, no gallops Respiratory: CTA bilaterally, no wheezing, no rhonchi Abdominal: Soft, NT, ND, bowel sounds + Extremities: no edema, no cyanosis    The results of significant diagnostics from this hospitalization (including imaging, microbiology, ancillary and laboratory) are listed below for reference.     Microbiology: Recent Results (from the past 240 hour(s))  Surgical PCR screen     Status: None   Collection Time: 08/16/17 11:00 PM  Result Value Ref Range Status   MRSA, PCR NEGATIVE NEGATIVE Final   Staphylococcus aureus NEGATIVE NEGATIVE Final    Comment: (NOTE) The  Xpert SA Assay (FDA approved for NASAL specimens in patients 61 years of age and older), is one component of a comprehensive surveillance program. It is not intended to diagnose infection nor to guide or monitor treatment. Performed at [facility], [place] [address], [place], [place] [address]      Labs: BNP (last 3 results) No results for input(s): BNP in the last 8760 hours. Basic Metabolic Panel: Recent  Labs  Lab 08/16/17 0551 08/17/17 0547 08/18/17 0500 08/19/17 1045 08/20/17 0803  NA 134* 134* 133* 133* 138  K 4.3 3.9 4.2 4.2 4.0  CL 101 102 101 100* 101  CO2 24 25 21* 24 27  GLUCOSE 106* 104* 158* 150* 112*  BUN 24* 18 20 28* 28*  CREATININE 1.81* 1.34* 1.32* 1.21 1.23  CALCIUM 8.8* 8.5* 8.6* 8.9 9.0  MG  --  1.4*  --  1.6*  --    Liver Function Tests: No results for input(s): AST, ALT, ALKPHOS, BILITOT, PROT, ALBUMIN in the last 168 hours. No results for input(s): LIPASE, AMYLASE in the last 168 hours. No results for input(s): AMMONIA in the last 168 hours. CBC: Recent Labs  Lab 08/15/17 0013 08/16/17 0551 08/17/17 0547 08/18/17 0500 08/19/17 0528 08/19/17 1045 08/20/17 0803  WBC 8.6 7.3 6.3 8.2 10.0  --   --   NEUTROABS 6.1  --   --   --   --   --   --   HGB 10.9* 10.1* 9.1* 9.0* 7.8* 7.9* 8.0*  HCT 32.8* 31.0* 26.5* 26.5* 22.4* 23.1* 23.8*  MCV 90.1 90.9 91.1 90.8 89.2  --   --   PLT 233 236 189 224 260  --   --    Cardiac Enzymes: Recent Labs  Lab 08/19/17 1045 08/19/17 1608 08/19/17 2153  TROPONINI <0.03 0.03* 0.03*   BNP: Invalid input(s): POCBNP CBG: No results for input(s): GLUCAP in the last 168 hours. D-Dimer No results for input(s): DDIMER in the last 72 hours. Hgb A1c No results for input(s): HGBA1C in the last 72 hours. Lipid Profile No results for input(s): CHOL, HDL, LDLCALC, TRIG, CHOLHDL, LDLDIRECT in the last 72 hours. Thyroid function studies No results for input(s): TSH, T4TOTAL, T3FREE, THYROIDAB in the  last 72 hours.  Invalid input(s): FREET3 Anemia work up No results for input(s): VITAMINB12, FOLATE, FERRITIN, TIBC, IRON, RETICCTPCT in the last 72 hours. Urinalysis    Component Value Date/Time   COLORURINE AMBER BIOCHEMICALS MAY BE AFFECTED BY COLOR (A) 07/05/2008 0314   APPEARANCEUR CLOUDY (A) 07/05/2008 0314   LABSPEC 1.018 07/05/2008 0314   PHURINE 6.5 07/05/2008 0314   GLUCOSEU NEGATIVE 07/05/2008 0314   HGBUR LARGE (A) 07/05/2008 0314   BILIRUBINUR SMALL (A) 07/05/2008 0314   KETONESUR 15 (A) 07/05/2008 0314   PROTEINUR 100 (A) 07/05/2008 0314   UROBILINOGEN 1.0 07/05/2008 0314   NITRITE NEGATIVE 07/05/2008 0314   LEUKOCYTESUR NEGATIVE 07/05/2008 0314   Sepsis Labs Invalid input(s): PROCALCITONIN,  WBC,  LACTICIDVEN Microbiology Recent Results (from the past 240 hour(s))  Surgical PCR screen     Status: None   Collection Time: 08/16/17 11:00 PM  Result Value Ref Range Status   MRSA, PCR NEGATIVE NEGATIVE Final   Staphylococcus aureus NEGATIVE NEGATIVE Final    Comment: (NOTE) The Xpert SA Assay (FDA approved for NASAL specimens in patients 73 years of age and older), is one component of a comprehensive surveillance program. It is not intended to diagnose infection nor to guide or monitor treatment. Performed at [facility], [place] [address], [place], [place] [address]      Time coordinating discharge: Over 30 minutes  SIGNED:   Hosie Poisson, MD  [facility] 08/21/2017, 7:00 PM Pager   If 7PM-7AM, please contact night-coverage [email or web address] Password [email or web address]

## 2017-08-22 DIAGNOSIS — I442 Atrioventricular block, complete: Secondary | ICD-10-CM | POA: Diagnosis not present

## 2017-08-22 DIAGNOSIS — D649 Anemia, unspecified: Secondary | ICD-10-CM | POA: Diagnosis not present

## 2017-08-22 DIAGNOSIS — Z8673 Personal history of transient ischemic attack (TIA), and cerebral infarction without residual deficits: Secondary | ICD-10-CM | POA: Diagnosis not present

## 2017-08-22 DIAGNOSIS — I480 Paroxysmal atrial fibrillation: Secondary | ICD-10-CM | POA: Diagnosis not present

## 2017-08-22 DIAGNOSIS — Z950 Presence of cardiac pacemaker: Secondary | ICD-10-CM | POA: Diagnosis not present

## 2017-08-22 DIAGNOSIS — W010XXD Fall on same level from slipping, tripping and stumbling without subsequent striking against object, subsequent encounter: Secondary | ICD-10-CM | POA: Diagnosis not present

## 2017-08-22 DIAGNOSIS — M9701XD Periprosthetic fracture around internal prosthetic right hip joint, subsequent encounter: Secondary | ICD-10-CM | POA: Diagnosis not present

## 2017-08-22 DIAGNOSIS — Z7982 Long term (current) use of aspirin: Secondary | ICD-10-CM | POA: Diagnosis not present

## 2017-08-22 DIAGNOSIS — Z96641 Presence of right artificial hip joint: Secondary | ICD-10-CM | POA: Diagnosis not present

## 2017-08-22 DIAGNOSIS — E039 Hypothyroidism, unspecified: Secondary | ICD-10-CM | POA: Diagnosis not present

## 2017-08-22 DIAGNOSIS — I739 Peripheral vascular disease, unspecified: Secondary | ICD-10-CM | POA: Diagnosis not present

## 2017-08-22 DIAGNOSIS — I10 Essential (primary) hypertension: Secondary | ICD-10-CM | POA: Diagnosis not present

## 2017-08-25 DIAGNOSIS — Z950 Presence of cardiac pacemaker: Secondary | ICD-10-CM | POA: Diagnosis not present

## 2017-09-01 DIAGNOSIS — M1711 Unilateral primary osteoarthritis, right knee: Secondary | ICD-10-CM | POA: Diagnosis not present

## 2017-09-01 DIAGNOSIS — Z4889 Encounter for other specified surgical aftercare: Secondary | ICD-10-CM | POA: Diagnosis not present

## 2017-09-01 DIAGNOSIS — M25551 Pain in right hip: Secondary | ICD-10-CM | POA: Diagnosis not present

## 2017-09-14 DIAGNOSIS — Z4789 Encounter for other orthopedic aftercare: Secondary | ICD-10-CM | POA: Diagnosis not present

## 2017-09-14 DIAGNOSIS — S728X1D Other fracture of right femur, subsequent encounter for closed fracture with routine healing: Secondary | ICD-10-CM | POA: Diagnosis not present

## 2017-09-14 DIAGNOSIS — M1711 Unilateral primary osteoarthritis, right knee: Secondary | ICD-10-CM | POA: Diagnosis not present

## 2017-09-14 DIAGNOSIS — M25561 Pain in right knee: Secondary | ICD-10-CM | POA: Diagnosis not present

## 2017-10-12 DIAGNOSIS — Z4732 Aftercare following explantation of hip joint prosthesis: Secondary | ICD-10-CM | POA: Diagnosis not present

## 2017-10-12 DIAGNOSIS — M25561 Pain in right knee: Secondary | ICD-10-CM | POA: Insufficient documentation

## 2017-10-14 ENCOUNTER — Ambulatory Visit (INDEPENDENT_AMBULATORY_CARE_PROVIDER_SITE_OTHER): Payer: PPO | Admitting: Cardiology

## 2017-10-14 ENCOUNTER — Encounter: Payer: Self-pay | Admitting: Cardiology

## 2017-10-14 VITALS — BP 138/64 | HR 68 | Ht 64.0 in | Wt 181.1 lb

## 2017-10-14 DIAGNOSIS — I10 Essential (primary) hypertension: Secondary | ICD-10-CM

## 2017-10-14 DIAGNOSIS — I442 Atrioventricular block, complete: Secondary | ICD-10-CM

## 2017-10-14 DIAGNOSIS — I480 Paroxysmal atrial fibrillation: Secondary | ICD-10-CM | POA: Diagnosis not present

## 2017-10-14 DIAGNOSIS — E785 Hyperlipidemia, unspecified: Secondary | ICD-10-CM

## 2017-10-14 NOTE — Progress Notes (Signed)
Cardiology Office Note:    Date:  10/14/2017   ID:  [name], DOB [birth year], MRN [id number]  PCP:  Lillard Anes, MD  Cardiologist:  Jenne Campus, MD    Referring MD: Lillard Anes,*   Chief Complaint  Patient presents with  . Follow-up  . Hospitalization Follow-up  Doing much better he was in the hospital because of leg fracture  History of Present Illness:    [name] is a 83 y.o. male with coronary artery disease, peripheral vascular disease, complete heart block, pacemaker.  Comes to our office in follow-up recently in February he ended up going to hospital because of fall he broke his right leg and required surgery went to surgery with no difficulties doing well recovering still have some difficulty walking uses [place].  Denies have any cardiac complaints no chest pain tightness squeezing pressure burning chest.  No cardiac complication of surgery.  Had a long discussion about what to do with the situation he is taking anticoagulation for prevention of CVA because of paroxysmal atrial fibrillation at the same time he sustained a fall which obviously make the situation somewhat difficult.  We decided to stop his aspirin there is no recent coronary event, will continue with Eliquis as previously  Past Medical History:  Diagnosis Date  . Asthma   . Hypertension   . Peripheral vascular disease [facility])     Past Surgical History:  Procedure Laterality Date  . CATARACT EXTRACTION    . HIP SURGERY    . INSERT / REPLACE / REMOVE PACEMAKER     [employer]  . LEFT HEART CATH AND CORONARY ANGIOGRAPHY N/A 02/08/2017   Procedure: LEFT HEART CATH AND CORONARY ANGIOGRAPHY;  Surgeon: Martinique, Peter M, MD;  Location: [facility];  Service: Cardiovascular;  Laterality: N/A;  . ORIF PERIPROSTHETIC FRACTURE Right 08/17/2017   Procedure: OPEN REDUCTION INTERNAL FIXATION (ORIF) PERIPROSTHETIC FRACTURE RIGHT FEMUR;  Surgeon: Paralee Cancel, MD;  Location: [facility]  ORS;  Service: Orthopedics;  Laterality: Right;    Current Medications: Current Meds  Medication Sig  . albuterol (PROVENTIL HFA;VENTOLIN HFA) 108 (90 Base) MCG/ACT inhaler Inhale 2 puffs into the lungs 4 (four) times daily as needed for wheezing or shortness of breath.  Marland Kitchen amLODipine (NORVASC) 5 MG tablet TAKE 1 TABLET(5 MG) BY MOUTH DAILY  . apixaban (ELIQUIS) 5 MG TABS tablet Take 1 tablet (5 mg total) by mouth 2 (two) times daily.  Marland Kitchen aspirin EC 325 MG tablet Take 325 mg by mouth daily.  Marland Kitchen atorvastatin (LIPITOR) 40 MG tablet Take 40 mg by mouth daily.  . Calcium Carbonate-Vitamin D (CALCIUM 500 + D PO) Take 1 tablet by mouth daily.  . chlorthalidone (HYGROTON) 25 MG tablet Take 1 tablet (25 mg total) by mouth daily.  . fluticasone (FLOVENT HFA) 110 MCG/ACT inhaler Inhale 2 puffs into the lungs 2 (two) times daily.  . isosorbide mononitrate (IMDUR) 60 MG 24 hr tablet Take 1 tablet by mouth daily.  Marland Kitchen levothyroxine (SYNTHROID, LEVOTHROID) 50 MCG tablet Take 50 mcg by mouth daily before breakfast.  . Multiple Vitamins-Minerals (MULTIVITAMIN PO) Take 1 tablet by mouth daily.  . nitroGLYCERIN (NITROSTAT) 0.4 MG SL tablet Place 1 tablet (0.4 mg total) under the tongue every 5 (five) minutes as needed for chest pain.  Marland Kitchen omeprazole (PRILOSEC OTC) 20 MG tablet Take 2 tablets (40 mg total) by mouth daily.  . ranolazine (RANEXA) 500 MG 12 hr tablet Take 1 tablet (500 mg total) by mouth  2 (two) times daily.  . vitamin B-12 (CYANOCOBALAMIN) 250 MCG tablet Take 250 mcg by mouth daily.     Allergies:   Patient has no known allergies.   Social History   Socioeconomic History  . Marital status: Married    Spouse name: Not on file  . Number of children: Not on file  . Years of education: Not on file  . Highest education level: Not on file  Occupational History  . Not on file  Social Needs  . Financial resource strain: Not on file  . Food insecurity:    Worry: Not on file    Inability: Not on  file  . Transportation needs:    Medical: Not on file    Non-medical: Not on file  Tobacco Use  . Smoking status: Never Smoker  . Smokeless tobacco: Never Used  Substance and Sexual Activity  . Alcohol use: Yes  . Drug use: No  . Sexual activity: Not on file  Lifestyle  . Physical activity:    Days per week: Not on file    Minutes per session: Not on file  . Stress: Not on file  Relationships  . Social connections:    Talks on phone: Not on file    Gets together: Not on file    Attends religious service: Not on file    Active member of club or organization: Not on file    Attends meetings of clubs or organizations: Not on file    Relationship status: Not on file  Other Topics Concern  . Not on file  Social History Narrative  . Not on file     Family History: The patient's family history includes Heart attack in his mother. ROS:   Please see the history of present illness.    All 14 point review of systems negative except as described per history of present illness  EKGs/Labs/Other Studies Reviewed:      Recent Labs: 02/08/2017: ALT 19; TSH 3.081 08/19/2017: Magnesium 1.6; Platelets 260 08/20/2017: BUN 28; Creatinine, Ser 1.23; Hemoglobin 8.0; Potassium 4.0; Sodium 138  Recent Lipid Panel    Component Value Date/Time   CHOL 172 02/08/2017 0204   TRIG 176 (H) 02/08/2017 0204   HDL 43 02/08/2017 0204   CHOLHDL 4.0 02/08/2017 0204   VLDL 35 02/08/2017 0204   LDLCALC 94 02/08/2017 0204    Physical Exam:    VS:  BP 138/64   Pulse 68   Ht 5\' 4"  (1.626 m)   Wt 181 lb 1.9 oz (82.2 kg)   SpO2 95%   BMI 31.09 kg/m     Wt Readings from Last 3 Encounters:  10/14/17 181 lb 1.9 oz (82.2 kg)  08/17/17 185 lb (83.9 kg)  06/27/17 191 lb 0.6 oz (86.7 kg)     GEN:  Well nourished, well developed in no acute distress HEENT: Normal NECK: No JVD; No carotid bruits LYMPHATICS: No lymphadenopathy CARDIAC: RRR, no murmurs, no rubs, no gallops RESPIRATORY:  Clear to  auscultation without rales, wheezing or rhonchi  ABDOMEN: Soft, non-tender, non-distended MUSCULOSKELETAL:  No edema; No deformity  SKIN: Warm and dry LOWER EXTREMITIES: no swelling NEUROLOGIC:  Alert and oriented x 3 PSYCHIATRIC:  Normal affect   ASSESSMENT:    1. Paroxysmal atrial fibrillation (HCC)   2. Dyslipidemia   3. Complete heart block ([place])   4. Essential hypertension    PLAN:    In order of problems listed above:  1. Paroxysmal atrial fibrillation: Seems to be  doing well from that point review anticoagulation will continue his chads 2 vascular equals 3 but his tendency to falls is a very difficult issue.  I gave him some hints about how to look around his house to make sure there is nothing that he can tripped on and be careful. 2. Dyslipidemia, will call primary care physician to get his fasting lipid profile 3. Complete heart block.  Addressed with the pacemaker.  Recent interrogation normal function.  We will be back in hospital wil enroll him in our pacemaker clinic 4. Essential hypertension: His blood pressure well controlled we will continue present management   Medication Adjustments/Labs and Tests Ordered: Current medicines are reviewed at length with the patient today.  Concerns regarding medicines are outlined above.  No orders of the defined types were placed in this encounter.  Medication changes: No orders of the defined types were placed in this encounter.   Signed, Park Liter, MD, [facility] 10/14/2017 2:18 PM    [facility]

## 2017-10-14 NOTE — Patient Instructions (Signed)
Medication Instructions:  Your physician has recommended you make the following change in your medication:  STOP taking you Asprin  Labwork: None ordered  Testing/Procedures: None ordered  Follow-Up: Your physician recommends that you schedule a follow-up appointment in: 4 months with Dr. Agustin Cree   Any Other Special Instructions Will Be Listed Below (If Applicable).     If you need a refill on your cardiac medications before your next appointment, please call your pharmacy.

## 2017-10-17 ENCOUNTER — Encounter: Payer: Self-pay | Admitting: *Deleted

## 2017-11-09 DIAGNOSIS — M25561 Pain in right knee: Secondary | ICD-10-CM | POA: Diagnosis not present

## 2017-11-09 DIAGNOSIS — Z4789 Encounter for other orthopedic aftercare: Secondary | ICD-10-CM | POA: Diagnosis not present

## 2017-11-25 DIAGNOSIS — Z950 Presence of cardiac pacemaker: Secondary | ICD-10-CM | POA: Diagnosis not present

## 2017-12-02 DIAGNOSIS — J438 Other emphysema: Secondary | ICD-10-CM | POA: Diagnosis not present

## 2017-12-02 DIAGNOSIS — R799 Abnormal finding of blood chemistry, unspecified: Secondary | ICD-10-CM | POA: Diagnosis not present

## 2017-12-02 DIAGNOSIS — S7221XA Displaced subtrochanteric fracture of right femur, initial encounter for closed fracture: Secondary | ICD-10-CM | POA: Diagnosis not present

## 2017-12-02 DIAGNOSIS — I10 Essential (primary) hypertension: Secondary | ICD-10-CM | POA: Diagnosis not present

## 2017-12-02 DIAGNOSIS — E038 Other specified hypothyroidism: Secondary | ICD-10-CM | POA: Diagnosis not present

## 2017-12-02 DIAGNOSIS — E782 Mixed hyperlipidemia: Secondary | ICD-10-CM | POA: Diagnosis not present

## 2017-12-02 DIAGNOSIS — I2510 Atherosclerotic heart disease of native coronary artery without angina pectoris: Secondary | ICD-10-CM | POA: Diagnosis not present

## 2017-12-21 DIAGNOSIS — M25561 Pain in right knee: Secondary | ICD-10-CM | POA: Diagnosis not present

## 2017-12-21 DIAGNOSIS — M79651 Pain in right thigh: Secondary | ICD-10-CM | POA: Diagnosis not present

## 2017-12-21 DIAGNOSIS — M25551 Pain in right hip: Secondary | ICD-10-CM | POA: Diagnosis not present

## 2017-12-21 DIAGNOSIS — S7291XD Unspecified fracture of right femur, subsequent encounter for closed fracture with routine healing: Secondary | ICD-10-CM | POA: Diagnosis not present

## 2018-01-02 DIAGNOSIS — Z13820 Encounter for screening for osteoporosis: Secondary | ICD-10-CM | POA: Diagnosis not present

## 2018-01-02 DIAGNOSIS — M810 Age-related osteoporosis without current pathological fracture: Secondary | ICD-10-CM | POA: Diagnosis not present

## 2018-01-11 DIAGNOSIS — S7291XG Unspecified fracture of right femur, subsequent encounter for closed fracture with delayed healing: Secondary | ICD-10-CM | POA: Diagnosis not present

## 2018-01-12 ENCOUNTER — Telehealth: Payer: Self-pay | Admitting: Cardiology

## 2018-01-12 DIAGNOSIS — S7221XD Displaced subtrochanteric fracture of right femur, subsequent encounter for closed fracture with routine healing: Secondary | ICD-10-CM | POA: Diagnosis not present

## 2018-01-12 DIAGNOSIS — Z01810 Encounter for preprocedural cardiovascular examination: Secondary | ICD-10-CM | POA: Diagnosis not present

## 2018-01-12 NOTE — Telephone Encounter (Signed)
Patient's wife called to inform us that his surgery clearance is being sent to Dr. Raliegh Ip today. Call back if patient needs to be seen. Surgery is August 5.

## 2018-01-16 NOTE — Telephone Encounter (Signed)
Cardiac clearance form signed by Dr. Agustin Cree and successfully faxed to [facility] as requested on Friday, 01/13/18.

## 2018-01-19 NOTE — Patient Instructions (Signed)
[name]  01/19/2018   Your procedure is scheduled on: 01-23-18   Report to [facility] Main  Entrance    Report to Admitting at 11:00 AM    Call this number if you have problems the morning of surgery [phone]   Remember: Do not eat food or drink liquids :After Midnight. You may have a Clear Liquid Diet from Midnight until 7:30 AM. After 7:30 AM, nothing until after surgery.     CLEAR LIQUID DIET   Foods Allowed                                                                     Foods Excluded  Coffee and tea, regular and decaf                             liquids that you cannot  Plain Jell-O in any flavor                                             see through such as: Fruit ices (not with fruit pulp)                                     milk, soups, orange juice  Iced Popsicles                                    All solid food Carbonated beverages, regular and diet                                    Cranberry, grape and apple juices Sports drinks like Gatorade Lightly seasoned clear broth or consume(fat free) Sugar, honey syrup  Sample Menu Breakfast                                Lunch                                     Supper Cranberry juice                    Beef broth                            Chicken broth Jell-O                                     Grape juice  Apple juice Coffee or tea                        Jell-O                                      Popsicle                                                Coffee or tea                        Coffee or tea  _____________________________________________________________________     Take these medicines the morning of surgery with A SIP OF WATER: Amlodipine (Norvasc), Isosorbide Mononitrate (Imdur), Levothyroxine (Synthroid), Omeprazole (Prilosec), and Ranolazine (Ranexa)                                You may not have any metal on your body including hair  pins and              piercings  Do not wear jewelry, lotions, powders, cologne, or deodorant             Men may shave face and neck.   Do not bring valuables to the hospital. [place].  Contacts, dentures or bridgework may not be worn into surgery.  Leave suitcase in the car. After surgery it may be brought to your room.     Special Instructions: N/A              Please read over the following fact sheets you were given: _____________________________________________________________________          [facility] - Preparing for Surgery Before surgery, you can play an important role.  Because skin is not sterile, your skin needs to be as free of germs as possible.  You can reduce the number of germs on your skin by washing with CHG (chlorahexidine gluconate) soap before surgery.  CHG is an antiseptic cleaner which kills germs and bonds with the skin to continue killing germs even after washing. Please DO NOT use if you have an allergy to CHG or antibacterial soaps.  If your skin becomes reddened/irritated stop using the CHG and inform your nurse when you arrive at Short Stay. Do not shave (including legs and underarms) for at least 48 hours prior to the first CHG shower.  You may shave your face/neck. Please follow these instructions carefully:  1.  Shower with CHG Soap the night before surgery and the  morning of Surgery.  2.  If you choose to wash your hair, wash your hair first as usual with your  normal  shampoo.  3.  After you shampoo, rinse your hair and body thoroughly to remove the  shampoo.                           4.  Use CHG as you would any other liquid soap.  You can apply chg directly  to the skin and wash  Gently with a scrungie or clean washcloth.  5.  Apply the CHG Soap to your body ONLY FROM THE NECK DOWN.   Do not use on face/ open                           Wound or open sores. Avoid contact with eyes,  ears mouth and genitals (private parts).                       Wash face,  Genitals (private parts) with your normal soap.             6.  Wash thoroughly, paying special attention to the area where your surgery  will be performed.  7.  Thoroughly rinse your body with warm water from the neck down.  8.  DO NOT shower/wash with your normal soap after using and rinsing off  the CHG Soap.                9.  Pat yourself dry with a clean towel.            10.  Wear clean pajamas.            11.  Place clean sheets on your bed the night of your first shower and do not  sleep with pets. Day of Surgery : Do not apply any lotions/deodorants the morning of surgery.  Please wear clean clothes to the hospital/surgery center.  FAILURE TO FOLLOW THESE INSTRUCTIONS MAY RESULT IN THE CANCELLATION OF YOUR SURGERY PATIENT SIGNATURE_________________________________  NURSE SIGNATURE__________________________________  ________________________________________________________________________    Adam Phenix  An incentive spirometer is a tool that can help keep your lungs clear and active. This tool measures how well you are filling your lungs with each breath. Taking long deep breaths may help reverse or decrease the chance of developing breathing (pulmonary) problems (especially infection) following:  A long period of time when you are unable to move or be active. BEFORE THE PROCEDURE   If the spirometer includes an indicator to show your best effort, your nurse or respiratory therapist will set it to a desired goal.  If possible, sit up straight or lean slightly forward. Try not to slouch.  Hold the incentive spirometer in an upright position. INSTRUCTIONS FOR USE  1. Sit on the edge of your bed if possible, or sit up as far as you can in bed or on a chair. 2. Hold the incentive spirometer in an upright position. 3. Breathe out normally. 4. Place the mouthpiece in your mouth and seal your  lips tightly around it. 5. Breathe in slowly and as deeply as possible, raising the piston or the ball toward the top of the column. 6. Hold your breath for 3-5 seconds or for as long as possible. Allow the piston or ball to fall to the bottom of the column. 7. Remove the mouthpiece from your mouth and breathe out normally. 8. Rest for a few seconds and repeat Steps 1 through 7 at least 10 times every 1-2 hours when you are awake. Take your time and take a few normal breaths between deep breaths. 9. The spirometer may include an indicator to show your best effort. Use the indicator as a goal to work toward during each repetition. 10. After each set of 10 deep breaths, practice coughing to be sure your lungs are clear. If you have an incision (the cut made at the time  of surgery), support your incision when coughing by placing a pillow or rolled up towels firmly against it. Once you are able to get out of bed, walk around indoors and cough well. You may stop using the incentive spirometer when instructed by your caregiver.  RISKS AND COMPLICATIONS  Take your time so you do not get dizzy or light-headed.  If you are in pain, you may need to take or ask for pain medication before doing incentive spirometry. It is harder to take a deep breath if you are having pain. AFTER USE  Rest and breathe slowly and easily.  It can be helpful to keep track of a log of your progress. Your caregiver can provide you with a simple table to help with this. If you are using the spirometer at home, follow these instructions: [place] IF:   You are having difficultly using the spirometer.  You have trouble using the spirometer as often as instructed.  Your pain medication is not giving enough relief while using the spirometer.  You develop fever of 100.5 F (38.1 C) or higher. SEEK IMMEDIATE MEDICAL CARE IF:   You cough up bloody sputum that had not been present before.  You develop fever of 102 F  (38.9 C) or greater.  You develop worsening pain at or near the incision site. MAKE SURE YOU:   Understand these instructions.  Will watch your condition.  Will get help right away if you are not doing well or get worse. Document Released: 10/18/2006 Document Revised: 08/30/2011 Document Reviewed: 12/19/2006 Exit[employer] Patient Information 2014 Exit[employer], [place].   ________________________________________________________________________ WHAT IS A BLOOD TRANSFUSION?   Blood Transfusion Information  A transfusion is the replacement of blood or some of its parts. Blood is made up of multiple cells which provide different functions.  Red blood cells carry oxygen and are used for blood loss replacement.  White blood cells fight against infection.  Platelets control bleeding.  Plasma helps clot blood.  Other blood products are available for specialized needs, such as hemophilia or other clotting disorders. BEFORE THE TRANSFUSION  Who gives blood for transfusions?   Healthy volunteers who are fully evaluated to make sure their blood is safe. This is blood bank blood. Transfusion therapy is the safest it has ever been in the practice of medicine. Before blood is taken from a donor, a complete history is taken to make sure that person has no history of diseases nor engages in risky social behavior (examples are intravenous drug use or sexual activity with multiple partners). The donor's travel history is screened to minimize risk of transmitting infections, such as malaria. The donated blood is tested for signs of infectious diseases, such as HIV and hepatitis. The blood is then tested to be sure it is compatible with you in order to minimize the chance of a transfusion reaction. If you or a relative donates blood, this is often done in anticipation of surgery and is not appropriate for emergency situations. It takes many days to process the donated blood. RISKS AND COMPLICATIONS Although  transfusion therapy is very safe and saves many lives, the main dangers of transfusion include:   Getting an infectious disease.  Developing a transfusion reaction. This is an allergic reaction to something in the blood you were given. Every precaution is taken to prevent this. The decision to have a blood transfusion has been considered carefully by your caregiver before blood is given. Blood is not given unless the benefits outweigh the risks.  AFTER THE TRANSFUSION  Right after receiving a blood transfusion, you will usually feel much better and more energetic. This is especially true if your red blood cells have gotten low (anemic). The transfusion raises the level of the red blood cells which carry oxygen, and this usually causes an energy increase.  The nurse administering the transfusion will monitor you carefully for complications. HOME CARE INSTRUCTIONS  No special instructions are needed after a transfusion. You may find your energy is better. Speak with your caregiver about any limitations on activity for underlying diseases you may have. SEEK MEDICAL CARE IF:   Your condition is not improving after your transfusion.  You develop redness or irritation at the intravenous (IV) site. SEEK IMMEDIATE MEDICAL CARE IF:  Any of the following symptoms occur over the next 12 hours:  Shaking chills.  You have a temperature by mouth above 102 F (38.9 C), not controlled by medicine.  Chest, back, or muscle pain.  People around you feel you are not acting correctly or are confused.  Shortness of breath or difficulty breathing.  Dizziness and fainting.  You get a rash or develop hives.  You have a decrease in urine output.  Your urine turns a dark color or changes to pink, red, or brown. Any of the following symptoms occur over the next 10 days:  You have a temperature by mouth above 102 F (38.9 C), not controlled by medicine.  Shortness of breath.  Weakness after normal  activity.  The white part of the eye turns yellow (jaundice).  You have a decrease in the amount of urine or are urinating less often.  Your urine turns a dark color or changes to pink, red, or brown. Document Released: 06/04/2000 Document Revised: 08/30/2011 Document Reviewed: 01/22/2008 [facility] Patient Information 2014 [place], [place].  _______________________________________________________________________

## 2018-01-19 NOTE — Progress Notes (Signed)
01-12-18 (Epic) Cardiac Clearance from Dr. Jeryl Columbia  08-19-17 (Epic) EKG  08-15-17 (Epic) CXR

## 2018-01-20 ENCOUNTER — Encounter (HOSPITAL_COMMUNITY)
Admission: RE | Admit: 2018-01-20 | Discharge: 2018-01-20 | Disposition: A | Discharge status: Home / Self Care | Payer: PPO | Source: Ambulatory Visit | Attending: Orthopedic Surgery | Admitting: Orthopedic Surgery

## 2018-01-20 ENCOUNTER — Other Ambulatory Visit: Payer: Self-pay

## 2018-01-20 ENCOUNTER — Encounter (HOSPITAL_COMMUNITY): Payer: Self-pay

## 2018-01-20 DIAGNOSIS — I25110 Atherosclerotic heart disease of native coronary artery with unstable angina pectoris: Secondary | ICD-10-CM | POA: Diagnosis not present

## 2018-01-20 DIAGNOSIS — Z96641 Presence of right artificial hip joint: Secondary | ICD-10-CM | POA: Diagnosis not present

## 2018-01-20 DIAGNOSIS — Z7901 Long term (current) use of anticoagulants: Secondary | ICD-10-CM | POA: Diagnosis not present

## 2018-01-20 DIAGNOSIS — I739 Peripheral vascular disease, unspecified: Secondary | ICD-10-CM | POA: Diagnosis present

## 2018-01-20 DIAGNOSIS — J45909 Unspecified asthma, uncomplicated: Secondary | ICD-10-CM | POA: Diagnosis present

## 2018-01-20 DIAGNOSIS — M9701XA Periprosthetic fracture around internal prosthetic right hip joint, initial encounter: Secondary | ICD-10-CM | POA: Diagnosis present

## 2018-01-20 DIAGNOSIS — M9701XD Periprosthetic fracture around internal prosthetic right hip joint, subsequent encounter: Secondary | ICD-10-CM | POA: Diagnosis not present

## 2018-01-20 DIAGNOSIS — S72141A Displaced intertrochanteric fracture of right femur, initial encounter for closed fracture: Secondary | ICD-10-CM | POA: Diagnosis not present

## 2018-01-20 DIAGNOSIS — I10 Essential (primary) hypertension: Secondary | ICD-10-CM | POA: Diagnosis present

## 2018-01-20 DIAGNOSIS — Z7951 Long term (current) use of inhaled steroids: Secondary | ICD-10-CM | POA: Diagnosis not present

## 2018-01-20 DIAGNOSIS — I480 Paroxysmal atrial fibrillation: Secondary | ICD-10-CM | POA: Diagnosis not present

## 2018-01-20 DIAGNOSIS — Z79899 Other long term (current) drug therapy: Secondary | ICD-10-CM | POA: Diagnosis not present

## 2018-01-20 DIAGNOSIS — I2510 Atherosclerotic heart disease of native coronary artery without angina pectoris: Secondary | ICD-10-CM | POA: Diagnosis present

## 2018-01-20 DIAGNOSIS — E039 Hypothyroidism, unspecified: Secondary | ICD-10-CM | POA: Diagnosis present

## 2018-01-20 DIAGNOSIS — K219 Gastro-esophageal reflux disease without esophagitis: Secondary | ICD-10-CM | POA: Diagnosis present

## 2018-01-20 HISTORY — DX: Unspecified osteoarthritis, unspecified site: M19.90

## 2018-01-20 HISTORY — DX: Atherosclerotic heart disease of native coronary artery without angina pectoris: I25.10

## 2018-01-20 HISTORY — DX: Gastro-esophageal reflux disease without esophagitis: K21.9

## 2018-01-20 LAB — CBC
HEMATOCRIT: 32.2 % — AB (ref 39.0–52.0)
Hemoglobin: 10.8 g/dL — ABNORMAL LOW (ref 13.0–17.0)
MCH: 29 pg (ref 26.0–34.0)
MCHC: 33.5 g/dL (ref 30.0–36.0)
MCV: 86.6 fL (ref 78.0–100.0)
Platelets: 288 10*3/uL (ref 150–400)
RBC: 3.72 MIL/uL — AB (ref 4.22–5.81)
RDW: 16.8 % — AB (ref 11.5–15.5)
WBC: 6.4 10*3/uL (ref 4.0–10.5)

## 2018-01-20 LAB — BASIC METABOLIC PANEL
Anion gap: 8 (ref 5–15)
BUN: 22 mg/dL (ref 8–23)
CO2: 24 mmol/L (ref 22–32)
Calcium: 8.7 mg/dL — ABNORMAL LOW (ref 8.9–10.3)
Chloride: 104 mmol/L (ref 98–111)
Creatinine, Ser: 1.54 mg/dL — ABNORMAL HIGH (ref 0.61–1.24)
GFR calc non Af Amer: 41 mL/min — ABNORMAL LOW (ref >60)
GFR, EST AFRICAN AMERICAN: 47 mL/min — AB (ref >60)
Glucose, Bld: 100 mg/dL — ABNORMAL HIGH (ref 70–99)
POTASSIUM: 4.6 mmol/L (ref 3.5–5.1)
SODIUM: 136 mmol/L (ref 135–145)

## 2018-01-20 NOTE — Progress Notes (Signed)
CBC and BMP results routed to Dr. Alvan Dame for review

## 2018-01-22 NOTE — H&P (Signed)
[name] is an 83 y.o. male.    Chief Complaint:     S/P right periprosthetic femur fracture / nonunion   Procedure: Redo ORIF right periprosthetic femur fracture  HPI: Pt is a 83 y.o. male complaining of right femur pain for 3-4 months.  Pain had continually increased during that time.  X-rays in the clinic show failure of previous ORIF. Pt has tried various conservative treatments which have failed to alleviate their symptoms, including NSAIDs/analgesic meds, cortisone injection, activity modification and use of assistance device. Pain is 10 out of 10 at it's worse. He is effected by the pain during activity and at night.  Various options are discussed with the patient. Risks, benefits and expectations were discussed with the patient. Patient understand the risks, benefits and expectations and wishes to proceed with surgery.    PCP: Lillard Anes, MD  D/C Plans:       Home  Post-op Meds:       No Rx given   Tranexamic Acid:      To be given - IV   Decadron:      Is to be given  FYI:     Eliquis  Norco  DME:   Already has equipment   PT:   No PT   PMH: Past Medical History:  Diagnosis Date  . Arthritis   . Asthma   . Coronary artery disease   . GERD (gastroesophageal reflux disease)   . Hypertension   . Peripheral vascular disease (HCC)     PSH: Past Surgical History:  Procedure Laterality Date  . CATARACT EXTRACTION    . HIP SURGERY    . INSERT / REPLACE / REMOVE PACEMAKER     [employer]  . JOINT REPLACEMENT Right 10/20/1994   Hip  (Left Hip in 10/96)  . LEFT HEART CATH AND CORONARY ANGIOGRAPHY N/A 02/08/2017   Procedure: LEFT HEART CATH AND CORONARY ANGIOGRAPHY;  Surgeon: Martinique, Peter M, MD;  Location: [place] [facility];  Service: Cardiovascular;  Laterality: N/A;  . ORIF PERIPROSTHETIC FRACTURE Right 08/17/2017   Procedure: OPEN REDUCTION INTERNAL FIXATION (ORIF) PERIPROSTHETIC FRACTURE RIGHT FEMUR;  Surgeon: Paralee Cancel, MD;  Location: [facility]  ORS;  Service: Orthopedics;  Laterality: Right;    Social History:  reports that he has never smoked. He has never used smokeless tobacco. He reports that he drinks about 0.6 oz of alcohol per week. He reports that he does not use drugs.  Allergies:  No Known Allergies  Medications: No current facility-administered medications for this encounter.    Current Outpatient Medications  Medication Sig Dispense Refill  . albuterol (PROVENTIL HFA;VENTOLIN HFA) 108 (90 Base) MCG/ACT inhaler Inhale 2 puffs into the lungs 4 (four) times daily as needed for wheezing or shortness of breath.    Marland Kitchen amLODipine (NORVASC) 5 MG tablet TAKE 1 TABLET(5 MG) BY MOUTH DAILY 90 tablet 1  . apixaban (ELIQUIS) 5 MG TABS tablet Take 1 tablet (5 mg total) by mouth 2 (two) times daily. (Patient not taking: Reported on 01/20/2018) 60 tablet 11  . atorvastatin (LIPITOR) 40 MG tablet Take 40 mg by mouth daily.    . Calcium Carbonate-Vitamin D (CALCIUM 500 + D PO) Take 1 tablet by mouth daily.    . chlorthalidone (HYGROTON) 25 MG tablet Take 1 tablet (25 mg total) by mouth daily. 30 tablet 1  . fluticasone (FLOVENT HFA) 110 MCG/ACT inhaler Inhale 2 puffs into the lungs 2 (two) times daily.     Marland Kitchen  isosorbide mononitrate (IMDUR) 60 MG 24 hr tablet Take 60 mg by mouth daily.     Marland Kitchen levothyroxine (SYNTHROID, LEVOTHROID) 50 MCG tablet Take 50 mcg by mouth daily before breakfast.    . Multiple Vitamins-Minerals (MULTIVITAMIN PO) Take 1 tablet by mouth daily.    . nitroGLYCERIN (NITROSTAT) 0.4 MG SL tablet Place 1 tablet (0.4 mg total) under the tongue every 5 (five) minutes as needed for chest pain. 25 tablet 5  . omeprazole (PRILOSEC OTC) 20 MG tablet Take 2 tablets (40 mg total) by mouth daily. (Patient taking differently: Take 20 mg by mouth daily. ) 30 tablet 0  . ranolazine (RANEXA) 500 MG 12 hr tablet Take 1 tablet (500 mg total) by mouth 2 (two) times daily. 60 tablet 6  . vitamin B-12 (CYANOCOBALAMIN) 1000 MCG tablet Take  1,000 mcg by mouth daily.         Review of Systems  Constitutional: Negative.   HENT: Negative.   Eyes: Negative.   Respiratory: Negative.   Cardiovascular: Negative.   Gastrointestinal: Positive for heartburn.  Genitourinary: Negative.   Musculoskeletal: Positive for joint pain.  Skin: Negative.   Neurological: Negative.   Endo/Heme/Allergies: Negative.   Psychiatric/Behavioral: Negative.        Physical Exam  Constitutional: He is oriented to person, place, and time. He appears well-developed.  HENT:  Head: Normocephalic.  Eyes: Pupils are equal, round, and reactive to light.  Neck: Neck supple. No JVD present. No tracheal deviation present. No thyromegaly present.  Cardiovascular: Normal rate, regular rhythm and intact distal pulses.  Respiratory: Effort normal and breath sounds normal. No respiratory distress. He has no wheezes.  GI: Soft. There is no tenderness. There is no guarding.  Musculoskeletal:       Right hip: He exhibits decreased range of motion, decreased strength, tenderness and bony tenderness. He exhibits no swelling, no deformity and no laceration (healed previous incision).  Lymphadenopathy:    He has no cervical adenopathy.  Neurological: He is alert and oriented to person, place, and time.  Skin: Skin is warm and dry.  Psychiatric: He has a normal mood and affect.       Assessment/Plan Assessment:   S/P right periprosthetic femur fracture / nonunion    Plan: Patient will undergo a redo ORIF right periprosthetic femur fracture on 01/23/2018 per Dr. Alvan Dame at [facility]. Risks benefits and expectations were discussed with the patient. Patient understand risks, benefits and expectations and wishes to proceed.     West Pugh Asiyah Pineau   PA-C  01/22/2018, 11:12 PM

## 2018-01-23 ENCOUNTER — Other Ambulatory Visit: Payer: Self-pay

## 2018-01-23 ENCOUNTER — Inpatient Hospital Stay (HOSPITAL_COMMUNITY): Payer: PPO

## 2018-01-23 ENCOUNTER — Inpatient Hospital Stay (HOSPITAL_COMMUNITY): Payer: PPO | Admitting: Anesthesiology

## 2018-01-23 ENCOUNTER — Inpatient Hospital Stay (HOSPITAL_COMMUNITY)
Admission: RE | Admit: 2018-01-23 | Discharge: 2018-01-25 | DRG: 482 | Disposition: A | Discharge status: Home / Self Care | Payer: PPO | Source: Ambulatory Visit | Attending: Orthopedic Surgery | Admitting: Orthopedic Surgery

## 2018-01-23 ENCOUNTER — Encounter (HOSPITAL_COMMUNITY)
Admission: RE | Disposition: A | Discharge status: Home / Self Care | Payer: Self-pay | Source: Ambulatory Visit | Attending: Orthopedic Surgery

## 2018-01-23 ENCOUNTER — Encounter (HOSPITAL_COMMUNITY): Payer: Self-pay | Admitting: Anesthesiology

## 2018-01-23 DIAGNOSIS — I10 Essential (primary) hypertension: Secondary | ICD-10-CM | POA: Diagnosis present

## 2018-01-23 DIAGNOSIS — J45909 Unspecified asthma, uncomplicated: Secondary | ICD-10-CM | POA: Diagnosis present

## 2018-01-23 DIAGNOSIS — E039 Hypothyroidism, unspecified: Secondary | ICD-10-CM | POA: Diagnosis present

## 2018-01-23 DIAGNOSIS — Z79899 Other long term (current) drug therapy: Secondary | ICD-10-CM

## 2018-01-23 DIAGNOSIS — S7290XA Unspecified fracture of unspecified femur, initial encounter for closed fracture: Secondary | ICD-10-CM | POA: Insufficient documentation

## 2018-01-23 DIAGNOSIS — Z7951 Long term (current) use of inhaled steroids: Secondary | ICD-10-CM

## 2018-01-23 DIAGNOSIS — S7291XA Unspecified fracture of right femur, initial encounter for closed fracture: Secondary | ICD-10-CM | POA: Diagnosis present

## 2018-01-23 DIAGNOSIS — Z7901 Long term (current) use of anticoagulants: Secondary | ICD-10-CM | POA: Diagnosis not present

## 2018-01-23 DIAGNOSIS — M9701XA Periprosthetic fracture around internal prosthetic right hip joint, initial encounter: Principal | ICD-10-CM | POA: Diagnosis present

## 2018-01-23 DIAGNOSIS — Z96649 Presence of unspecified artificial hip joint: Secondary | ICD-10-CM

## 2018-01-23 DIAGNOSIS — I2510 Atherosclerotic heart disease of native coronary artery without angina pectoris: Secondary | ICD-10-CM | POA: Diagnosis present

## 2018-01-23 DIAGNOSIS — I739 Peripheral vascular disease, unspecified: Secondary | ICD-10-CM | POA: Diagnosis present

## 2018-01-23 DIAGNOSIS — E669 Obesity, unspecified: Secondary | ICD-10-CM | POA: Diagnosis present

## 2018-01-23 DIAGNOSIS — K219 Gastro-esophageal reflux disease without esophagitis: Secondary | ICD-10-CM | POA: Diagnosis present

## 2018-01-23 HISTORY — PX: ORIF PERIPROSTHETIC FRACTURE: SHX5034

## 2018-01-23 LAB — TYPE AND SCREEN
ABO/RH(D): O NEG
ANTIBODY SCREEN: NEGATIVE

## 2018-01-23 SURGERY — OPEN REDUCTION INTERNAL FIXATION (ORIF) PERIPROSTHETIC FRACTURE
Anesthesia: General | Site: Hip | Laterality: Right

## 2018-01-23 MED ORDER — DOCUSATE SODIUM 100 MG PO CAPS
100.0000 mg | ORAL_CAPSULE | Freq: Two times a day (BID) | ORAL | Status: DC
  Administered 2018-01-23 – 2018-01-25 (×4): 100 mg via ORAL
  Filled 2018-01-23 (×4): qty 1

## 2018-01-23 MED ORDER — BUDESONIDE 0.25 MG/2ML IN SUSP
0.2500 mg | Freq: Two times a day (BID) | RESPIRATORY_TRACT | Status: DC
  Administered 2018-01-23 – 2018-01-24 (×3): 0.25 mg via RESPIRATORY_TRACT
  Filled 2018-01-23 (×4): qty 2

## 2018-01-23 MED ORDER — TRANEXAMIC ACID 1000 MG/10ML IV SOLN
1000.0000 mg | INTRAVENOUS | Status: AC
  Administered 2018-01-23: 1000 mg via INTRAVENOUS
  Filled 2018-01-23: qty 10

## 2018-01-23 MED ORDER — NITROGLYCERIN 0.4 MG SL SUBL: 0.4000 mg | SUBLINGUAL_TABLET | SUBLINGUAL | Status: DC | PRN

## 2018-01-23 MED ORDER — FENTANYL CITRATE (PF) 100 MCG/2ML IJ SOLN
INTRAMUSCULAR | Status: AC
  Filled 2018-01-23: qty 2

## 2018-01-23 MED ORDER — SODIUM CHLORIDE 0.9 % IJ SOLN
INTRAMUSCULAR | Status: AC
  Filled 2018-01-23: qty 20

## 2018-01-23 MED ORDER — SUFENTANIL CITRATE 50 MCG/ML IV SOLN
INTRAVENOUS | Status: AC
  Filled 2018-01-23: qty 1

## 2018-01-23 MED ORDER — CEFAZOLIN SODIUM-DEXTROSE 2-4 GM/100ML-% IV SOLN
2.0000 g | INTRAVENOUS | Status: AC
  Administered 2018-01-23: 2 g via INTRAVENOUS
  Filled 2018-01-23: qty 100

## 2018-01-23 MED ORDER — AMLODIPINE BESYLATE 5 MG PO TABS
5.0000 mg | ORAL_TABLET | Freq: Every day | ORAL | Status: DC
  Administered 2018-01-24 – 2018-01-25 (×2): 5 mg via ORAL
  Filled 2018-01-23 (×2): qty 1

## 2018-01-23 MED ORDER — MENTHOL 3 MG MT LOZG: 1.0000 | LOZENGE | OROMUCOSAL | Status: DC | PRN

## 2018-01-23 MED ORDER — CELECOXIB 200 MG PO CAPS
200.0000 mg | ORAL_CAPSULE | Freq: Two times a day (BID) | ORAL | Status: DC
  Administered 2018-01-23 – 2018-01-25 (×4): 200 mg via ORAL
  Filled 2018-01-23 (×4): qty 1

## 2018-01-23 MED ORDER — ALBUTEROL SULFATE (2.5 MG/3ML) 0.083% IN NEBU: 2.5000 mg | INHALATION_SOLUTION | Freq: Four times a day (QID) | RESPIRATORY_TRACT | Status: DC | PRN

## 2018-01-23 MED ORDER — FERROUS SULFATE 325 (65 FE) MG PO TABS
325.0000 mg | ORAL_TABLET | Freq: Three times a day (TID) | ORAL | Status: DC
  Administered 2018-01-24 – 2018-01-25 (×4): 325 mg via ORAL
  Filled 2018-01-23 (×4): qty 1

## 2018-01-23 MED ORDER — PHENOL 1.4 % MT LIQD: 1.0000 | OROMUCOSAL | Status: DC | PRN

## 2018-01-23 MED ORDER — SODIUM CHLORIDE 0.9 % IR SOLN
Status: DC | PRN
  Administered 2018-01-23: 1000 mL

## 2018-01-23 MED ORDER — HYDROCODONE-ACETAMINOPHEN 7.5-325 MG PO TABS
1.0000 | ORAL_TABLET | ORAL | Status: DC | PRN
  Administered 2018-01-23: 2 via ORAL
  Filled 2018-01-23 (×2): qty 1
  Filled 2018-01-23: qty 2

## 2018-01-23 MED ORDER — DIPHENHYDRAMINE HCL 12.5 MG/5ML PO ELIX: 12.5000 mg | ORAL_SOLUTION | ORAL | Status: DC | PRN

## 2018-01-23 MED ORDER — METHOCARBAMOL 500 MG PO TABS: 500.0000 mg | ORAL_TABLET | Freq: Four times a day (QID) | ORAL | Status: DC | PRN

## 2018-01-23 MED ORDER — ROCURONIUM BROMIDE 10 MG/ML (PF) SYRINGE
PREFILLED_SYRINGE | INTRAVENOUS | Status: AC
  Filled 2018-01-23: qty 10

## 2018-01-23 MED ORDER — DEXAMETHASONE SODIUM PHOSPHATE 10 MG/ML IJ SOLN
10.0000 mg | Freq: Once | INTRAMUSCULAR | Status: AC
  Administered 2018-01-24: 10 mg via INTRAVENOUS
  Filled 2018-01-23: qty 1

## 2018-01-23 MED ORDER — CHLORHEXIDINE GLUCONATE 4 % EX LIQD: 60.0000 mL | Freq: Once | CUTANEOUS | Status: DC

## 2018-01-23 MED ORDER — LIDOCAINE 2% (20 MG/ML) 5 ML SYRINGE
INTRAMUSCULAR | Status: AC
  Filled 2018-01-23: qty 5

## 2018-01-23 MED ORDER — LIDOCAINE 2% (20 MG/ML) 5 ML SYRINGE
INTRAMUSCULAR | Status: DC | PRN
  Administered 2018-01-23: 50 mg via INTRAVENOUS

## 2018-01-23 MED ORDER — METHOCARBAMOL 500 MG IVPB - SIMPLE MED
500.0000 mg | Freq: Four times a day (QID) | INTRAVENOUS | Status: DC | PRN
  Administered 2018-01-23: 500 mg via INTRAVENOUS
  Filled 2018-01-23: qty 50

## 2018-01-23 MED ORDER — METOCLOPRAMIDE HCL 5 MG PO TABS: 5.0000 mg | ORAL_TABLET | Freq: Three times a day (TID) | ORAL | Status: DC | PRN

## 2018-01-23 MED ORDER — DEXAMETHASONE SODIUM PHOSPHATE 10 MG/ML IJ SOLN
INTRAMUSCULAR | Status: AC
  Filled 2018-01-23: qty 1

## 2018-01-23 MED ORDER — DEXAMETHASONE SODIUM PHOSPHATE 10 MG/ML IJ SOLN
INTRAMUSCULAR | Status: DC | PRN
  Administered 2018-01-23: 10 mg via INTRAVENOUS

## 2018-01-23 MED ORDER — LEVOTHYROXINE SODIUM 50 MCG PO TABS
50.0000 ug | ORAL_TABLET | Freq: Every day | ORAL | Status: DC
  Administered 2018-01-24 – 2018-01-25 (×2): 50 ug via ORAL
  Filled 2018-01-23 (×2): qty 1

## 2018-01-23 MED ORDER — TRANEXAMIC ACID 1000 MG/10ML IV SOLN
1000.0000 mg | Freq: Once | INTRAVENOUS | Status: AC
  Administered 2018-01-23: 1000 mg via INTRAVENOUS
  Filled 2018-01-23: qty 1000

## 2018-01-23 MED ORDER — ACETAMINOPHEN 325 MG PO TABS: 325.0000 mg | ORAL_TABLET | Freq: Four times a day (QID) | ORAL | Status: DC | PRN

## 2018-01-23 MED ORDER — OMEPRAZOLE 20 MG PO CPDR
20.0000 mg | DELAYED_RELEASE_CAPSULE | Freq: Every day | ORAL | Status: DC
  Administered 2018-01-24 – 2018-01-25 (×2): 20 mg via ORAL
  Filled 2018-01-23 (×2): qty 1

## 2018-01-23 MED ORDER — ONDANSETRON HCL 4 MG/2ML IJ SOLN
INTRAMUSCULAR | Status: AC
  Filled 2018-01-23: qty 2

## 2018-01-23 MED ORDER — SUGAMMADEX SODIUM 200 MG/2ML IV SOLN
INTRAVENOUS | Status: DC | PRN
  Administered 2018-01-23: 200 mg via INTRAVENOUS

## 2018-01-23 MED ORDER — ONDANSETRON HCL 4 MG/2ML IJ SOLN: 4.0000 mg | Freq: Four times a day (QID) | INTRAMUSCULAR | Status: DC | PRN

## 2018-01-23 MED ORDER — SUGAMMADEX SODIUM 200 MG/2ML IV SOLN
INTRAVENOUS | Status: AC
  Filled 2018-01-23: qty 2

## 2018-01-23 MED ORDER — ROCURONIUM BROMIDE 10 MG/ML (PF) SYRINGE
PREFILLED_SYRINGE | INTRAVENOUS | Status: DC | PRN
  Administered 2018-01-23: 50 mg via INTRAVENOUS
  Administered 2018-01-23: 10 mg via INTRAVENOUS

## 2018-01-23 MED ORDER — FENTANYL CITRATE (PF) 100 MCG/2ML IJ SOLN
25.0000 ug | INTRAMUSCULAR | Status: DC | PRN
  Administered 2018-01-23 (×3): 50 ug via INTRAVENOUS

## 2018-01-23 MED ORDER — BISACODYL 10 MG RE SUPP: 10.0000 mg | Freq: Every day | RECTAL | Status: DC | PRN

## 2018-01-23 MED ORDER — APIXABAN 2.5 MG PO TABS
2.5000 mg | ORAL_TABLET | Freq: Two times a day (BID) | ORAL | Status: DC
  Administered 2018-01-24 – 2018-01-25 (×3): 2.5 mg via ORAL
  Filled 2018-01-23 (×3): qty 1

## 2018-01-23 MED ORDER — SODIUM CHLORIDE 0.9 % IV SOLN
INTRAVENOUS | Status: DC
  Administered 2018-01-23 – 2018-01-24 (×3): via INTRAVENOUS

## 2018-01-23 MED ORDER — ALUM & MAG HYDROXIDE-SIMETH 200-200-20 MG/5ML PO SUSP: 15.0000 mL | ORAL | Status: DC | PRN

## 2018-01-23 MED ORDER — ATORVASTATIN CALCIUM 40 MG PO TABS
40.0000 mg | ORAL_TABLET | Freq: Every day | ORAL | Status: DC
  Administered 2018-01-23 – 2018-01-24 (×2): 40 mg via ORAL
  Filled 2018-01-23 (×2): qty 1

## 2018-01-23 MED ORDER — CHLORTHALIDONE 25 MG PO TABS
25.0000 mg | ORAL_TABLET | Freq: Every day | ORAL | Status: DC
  Administered 2018-01-23 – 2018-01-25 (×3): 25 mg via ORAL
  Filled 2018-01-23 (×3): qty 1

## 2018-01-23 MED ORDER — LACTATED RINGERS IV SOLN
INTRAVENOUS | Status: DC
  Administered 2018-01-23 (×2): via INTRAVENOUS

## 2018-01-23 MED ORDER — ONDANSETRON HCL 4 MG/2ML IJ SOLN
INTRAMUSCULAR | Status: DC | PRN
  Administered 2018-01-23: 4 mg via INTRAVENOUS

## 2018-01-23 MED ORDER — SUFENTANIL CITRATE 50 MCG/ML IV SOLN
INTRAVENOUS | Status: DC | PRN
  Administered 2018-01-23: 20 ug via INTRAVENOUS
  Administered 2018-01-23: 5 ug via INTRAVENOUS
  Administered 2018-01-23: 10 ug via INTRAVENOUS

## 2018-01-23 MED ORDER — PROPOFOL 10 MG/ML IV BOLUS
INTRAVENOUS | Status: AC
  Filled 2018-01-23: qty 20

## 2018-01-23 MED ORDER — RANOLAZINE ER 500 MG PO TB12
500.0000 mg | ORAL_TABLET | Freq: Two times a day (BID) | ORAL | Status: DC
  Administered 2018-01-23 – 2018-01-25 (×4): 500 mg via ORAL
  Filled 2018-01-23 (×4): qty 1

## 2018-01-23 MED ORDER — ATORVASTATIN CALCIUM 40 MG PO TABS: 40.0000 mg | ORAL_TABLET | Freq: Every day | ORAL | Status: DC

## 2018-01-23 MED ORDER — PROPOFOL 10 MG/ML IV BOLUS
INTRAVENOUS | Status: DC | PRN
  Administered 2018-01-23: 100 mg via INTRAVENOUS

## 2018-01-23 MED ORDER — HYDROCODONE-ACETAMINOPHEN 5-325 MG PO TABS
1.0000 | ORAL_TABLET | ORAL | Status: DC | PRN
  Administered 2018-01-23 – 2018-01-25 (×5): 1 via ORAL
  Filled 2018-01-23 (×5): qty 1

## 2018-01-23 MED ORDER — ONDANSETRON HCL 4 MG PO TABS: 4.0000 mg | ORAL_TABLET | Freq: Four times a day (QID) | ORAL | Status: DC | PRN

## 2018-01-23 MED ORDER — MORPHINE SULFATE (PF) 2 MG/ML IV SOLN: 0.5000 mg | INTRAVENOUS | Status: DC | PRN

## 2018-01-23 MED ORDER — PHENYLEPHRINE 40 MCG/ML (10ML) SYRINGE FOR IV PUSH (FOR BLOOD PRESSURE SUPPORT)
PREFILLED_SYRINGE | INTRAVENOUS | Status: DC | PRN
  Administered 2018-01-23 (×2): 80 ug via INTRAVENOUS

## 2018-01-23 MED ORDER — CEFAZOLIN SODIUM-DEXTROSE 2-4 GM/100ML-% IV SOLN
2.0000 g | Freq: Three times a day (TID) | INTRAVENOUS | Status: AC
  Administered 2018-01-23 – 2018-01-24 (×2): 2 g via INTRAVENOUS
  Filled 2018-01-23 (×3): qty 100

## 2018-01-23 MED ORDER — DEXAMETHASONE SODIUM PHOSPHATE 10 MG/ML IJ SOLN: 10.0000 mg | Freq: Once | INTRAMUSCULAR | Status: DC

## 2018-01-23 MED ORDER — POLYETHYLENE GLYCOL 3350 17 G PO PACK
17.0000 g | PACK | Freq: Two times a day (BID) | ORAL | Status: DC
  Administered 2018-01-23 – 2018-01-25 (×4): 17 g via ORAL
  Filled 2018-01-23 (×4): qty 1

## 2018-01-23 MED ORDER — SODIUM CHLORIDE 0.9 % IJ SOLN
INTRAMUSCULAR | Status: AC
  Filled 2018-01-23: qty 10

## 2018-01-23 MED ORDER — MAGNESIUM CITRATE PO SOLN: 1.0000 | Freq: Once | ORAL | Status: DC | PRN

## 2018-01-23 MED ORDER — METHOCARBAMOL 500 MG IVPB - SIMPLE MED
INTRAVENOUS | Status: AC
  Filled 2018-01-23: qty 50

## 2018-01-23 MED ORDER — ISOSORBIDE MONONITRATE ER 60 MG PO TB24
60.0000 mg | ORAL_TABLET | Freq: Every day | ORAL | Status: DC
  Administered 2018-01-24 – 2018-01-25 (×2): 60 mg via ORAL
  Filled 2018-01-23 (×2): qty 1

## 2018-01-23 MED ORDER — METOCLOPRAMIDE HCL 5 MG/ML IJ SOLN: 5.0000 mg | Freq: Three times a day (TID) | INTRAMUSCULAR | Status: DC | PRN

## 2018-01-23 SURGICAL SUPPLY — 61 items
BAG ZIPLOCK 12X15 (MISCELLANEOUS) ×3 IMPLANT
BIT DRILL QC 3.3X195 (BIT) ×3 IMPLANT
BLADE SAW SAG 73X25 THK (BLADE) ×2
BLADE SAW SGTL 11.0X1.19X90.0M (BLADE) IMPLANT
BLADE SAW SGTL 73X25 THK (BLADE) ×1 IMPLANT
CABLE CERLAGE W/CRIMP 1.8 (Cable) ×12 IMPLANT
CABLE CERLAGE W/CRIMP 1.8MM (Cable) ×6 IMPLANT
CLOSURE WOUND 1/2 X4 (GAUZE/BANDAGES/DRESSINGS)
COVER SURGICAL LIGHT HANDLE (MISCELLANEOUS) ×3 IMPLANT
DERMABOND ADVANCED (GAUZE/BANDAGES/DRESSINGS) ×4
DERMABOND ADVANCED .7 DNX12 (GAUZE/BANDAGES/DRESSINGS) ×2 IMPLANT
DRAPE INCISE IOBAN 66X45 STRL (DRAPES) ×3 IMPLANT
DRAPE ORTHO SPLIT 77X108 STRL (DRAPES) ×4
DRAPE POUCH INSTRU U-SHP 10X18 (DRAPES) ×3 IMPLANT
DRAPE SURG 17X11 SM STRL (DRAPES) ×3 IMPLANT
DRAPE SURG ORHT 6 SPLT 77X108 (DRAPES) ×2 IMPLANT
DRAPE U-SHAPE 47X51 STRL (DRAPES) ×3 IMPLANT
DRESSING AQUACEL AG SP 3.5X10 (GAUZE/BANDAGES/DRESSINGS) ×1 IMPLANT
DRSG AQUACEL AG SP 3.5X10 (GAUZE/BANDAGES/DRESSINGS) ×3
DRSG PAD ABDOMINAL 8X10 ST (GAUZE/BANDAGES/DRESSINGS) ×6 IMPLANT
DURAPREP 26ML APPLICATOR (WOUND CARE) ×3 IMPLANT
ELECT BLADE TIP CTD 4 INCH (ELECTRODE) ×3 IMPLANT
ELECT REM PT RETURN 15FT ADLT (MISCELLANEOUS) ×3 IMPLANT
FACESHIELD WRAPAROUND (MASK) ×12 IMPLANT
GAUZE SPONGE 4X4 12PLY STRL (GAUZE/BANDAGES/DRESSINGS) ×6 IMPLANT
GLOVE BIOGEL M 7.0 STRL (GLOVE) IMPLANT
GLOVE BIOGEL PI IND STRL 7.5 (GLOVE) ×1 IMPLANT
GLOVE BIOGEL PI IND STRL 8.5 (GLOVE) ×1 IMPLANT
GLOVE BIOGEL PI INDICATOR 7.5 (GLOVE) ×2
GLOVE BIOGEL PI INDICATOR 8.5 (GLOVE) ×2
GLOVE ECLIPSE 8.0 STRL XLNG CF (GLOVE) IMPLANT
GLOVE ORTHO TXT STRL SZ7.5 (GLOVE) ×3 IMPLANT
GLOVE SURG ORTHO 8.0 STRL STRW (GLOVE) ×3 IMPLANT
GOWN STRL REUS W/TWL LRG LVL3 (GOWN DISPOSABLE) ×3 IMPLANT
GOWN STRL REUS W/TWL XL LVL3 (GOWN DISPOSABLE) ×6 IMPLANT
IMMOBILIZER KNEE 20 (SOFTGOODS)
IMMOBILIZER KNEE 20 THIGH 36 (SOFTGOODS) IMPLANT
KIT BASIN OR (CUSTOM PROCEDURE TRAY) ×3 IMPLANT
LOCKPLATE CABLE BUTTON NCP HIP (Orthopedic Implant) ×18 IMPLANT
MANIFOLD NEPTUNE II (INSTRUMENTS) ×3 IMPLANT
PACK TOTAL JOINT (CUSTOM PROCEDURE TRAY) ×3 IMPLANT
PASSER SUT SWANSON 36MM LOOP (INSTRUMENTS) IMPLANT
PLATE FEMUR PROX NCB 9HOLE PP (Plate) ×3 IMPLANT
POSITIONER SURGICAL ARM (MISCELLANEOUS) ×3 IMPLANT
SCREW NCB 4.0MX34M (Screw) ×3 IMPLANT
SCREW NCB 4.0X36MM (Screw) ×6 IMPLANT
SPONGE LAP 18X18 RF (DISPOSABLE) ×3 IMPLANT
SPONGE LAP 4X18 RFD (DISPOSABLE) ×3 IMPLANT
STAPLER SKIN PROX WIDE 3.9 (STAPLE) ×6 IMPLANT
STRIP CLOSURE SKIN 1/2X4 (GAUZE/BANDAGES/DRESSINGS) IMPLANT
SUCTION FRAZIER HANDLE 10FR (MISCELLANEOUS) ×2
SUCTION TUBE FRAZIER 10FR DISP (MISCELLANEOUS) ×1 IMPLANT
SUT ETHIBOND NAB CT1 #1 30IN (SUTURE) IMPLANT
SUT MNCRL AB 3-0 PS2 18 (SUTURE) ×3 IMPLANT
SUT VIC AB 1 CT1 36 (SUTURE) ×9 IMPLANT
SUT VIC AB 2-0 CT1 27 (SUTURE) ×6
SUT VIC AB 2-0 CT1 TAPERPNT 27 (SUTURE) ×3 IMPLANT
TISSUE GRFT STRUT 20X200 STRL (Bone Implant) ×3 IMPLANT
TOWEL OR 17X26 10 PK STRL BLUE (TOWEL DISPOSABLE) ×6 IMPLANT
TRAY FOLEY MTR SLVR 16FR STAT (SET/KITS/TRAYS/PACK) ×3 IMPLANT
WATER STERILE IRR 1000ML POUR (IV SOLUTION) ×3 IMPLANT

## 2018-01-23 NOTE — Transfer of Care (Signed)
Immediate Anesthesia Transfer of Care Note  Patient: [name]  Procedure(s) Performed: Redo open reduction internal fixation right periprosthetic femur fracture (Right Hip)  Patient Location: PACU  Anesthesia Type:General  Level of Consciousness: awake and alert   Airway & Oxygen Therapy: Patient Spontanous Breathing and Patient connected to face mask oxygen  Post-op Assessment: Report given to RN and Post -op Vital signs reviewed and stable  Post vital signs: Reviewed and stable  Last Vitals:  Vitals Value Taken Time  BP 197/87 01/23/2018  4:33 PM  Temp    Pulse 78 01/23/2018  4:34 PM  Resp 12 01/23/2018  4:34 PM  SpO2 100 % 01/23/2018  4:34 PM  Vitals shown include unvalidated device data.  Last Pain:  Vitals:   01/23/18 1131  TempSrc:   PainSc: 0-No pain         Complications: No apparent anesthesia complications

## 2018-01-23 NOTE — Interval H&P Note (Signed)
History and Physical Interval Note:  01/23/2018 1:20 PM  [name]  has presented today for surgery, with the diagnosis of Status post right periprosthetic femur fracutre, nonunion  The various methods of treatment have been discussed with the patient and family. After consideration of risks, benefits and other options for treatment, the patient has consented to  Procedure(s) with comments: Redo open reduction internal fixation right periprosthetic femur fracture (Right) - 120 mins as a surgical intervention .  The patient's history has been reviewed, patient examined, no change in status, stable for surgery.  I have reviewed the patient's chart and labs.  Questions were answered to the patient's satisfaction.     Mauri Pole

## 2018-01-23 NOTE — Anesthesia Procedure Notes (Addendum)
Procedure Name: Intubation Date/Time: 01/23/2018 2:03 PM Performed by: Sharlette Dense, CRNA Patient Re-evaluated:Patient Re-evaluated prior to induction Oxygen Delivery Method: Circle system utilized Preoxygenation: Pre-oxygenation with 100% oxygen Induction Type: IV induction Ventilation: Mask ventilation without difficulty and Oral airway inserted - appropriate to patient size Laryngoscope Size: Miller and 3 Grade View: Grade I Tube type: Oral Tube size: 8.0 mm Number of attempts: 1 Airway Equipment and Method: Stylet Placement Confirmation: ETT inserted through vocal cords under direct vision,  positive ETCO2 and breath sounds checked- equal and bilateral Secured at: 23 cm Tube secured with: Tape Dental Injury: Teeth and Oropharynx as per pre-operative assessment

## 2018-01-23 NOTE — Anesthesia Preprocedure Evaluation (Addendum)
Anesthesia Evaluation  Patient identified by MRN, date of birth, ID band Patient awake    Reviewed: Allergy & Precautions, NPO status , Patient's Chart, lab work & pertinent test results  Airway Mallampati: I  TM Distance: >3 FB Neck ROM: Full    Dental  (+) Edentulous Upper, Edentulous Lower   Pulmonary asthma ,    breath sounds clear to auscultation       Cardiovascular hypertension, Pt. on medications + CAD and + Peripheral Vascular Disease  + dysrhythmias + pacemaker  Rhythm:Regular Rate:Normal     Neuro/Psych negative neurological ROS  negative psych ROS   GI/Hepatic Neg liver ROS, GERD  Medicated,  Endo/Other  Hypothyroidism   Renal/GU      Musculoskeletal  (+) Arthritis ,   Abdominal Normal abdominal exam  (+)   Peds  Hematology  (+) anemia ,   Anesthesia Other Findings   Reproductive/Obstetrics                           Anesthesia Physical Anesthesia Plan  ASA: III  Anesthesia Plan: General   Post-op Pain Management:    Induction: Intravenous  PONV Risk Score and Plan: 3 and Ondansetron, Dexamethasone and Midazolam  Airway Management Planned: Oral ETT  Additional Equipment: None  Intra-op Plan:   Post-operative Plan: Extubation in OR  Informed Consent: I have reviewed the patients History and Physical, chart, labs and discussed the procedure including the risks, benefits and alternatives for the proposed anesthesia with the patient or authorized representative who has indicated his/her understanding and acceptance.   Dental advisory given  Plan Discussed with: CRNA  Anesthesia Plan Comments:        Anesthesia Quick Evaluation
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
## 2018-01-23 NOTE — Anesthesia Postprocedure Evaluation (Signed)
Anesthesia Post Note  Patient: [name]  Procedure(s) Performed: Redo open reduction internal fixation right periprosthetic femur fracture (Right Hip)     Patient location during evaluation: PACU Anesthesia Type: General Level of consciousness: awake and alert Pain management: pain level controlled Vital Signs Assessment: post-procedure vital signs reviewed and stable Respiratory status: spontaneous breathing, nonlabored ventilation, respiratory function stable and patient connected to nasal cannula oxygen Cardiovascular status: blood pressure returned to baseline and stable Postop Assessment: no apparent nausea or vomiting Anesthetic complications: no    Last Vitals:  Vitals:   01/23/18 1730 01/23/18 1749  BP: (!) 162/74 (!) 155/75  Pulse: 74 77  Resp: (!) 8 18  Temp: 36.4 C 36.5 C  SpO2: 100% 99%    Last Pain:  Vitals:   01/23/18 1749  TempSrc: Oral  PainSc:     LLE Motor Response: Purposeful movement (01/23/18 1804) LLE Sensation: Full sensation (01/23/18 1804) RLE Motor Response: Purposeful movement (01/23/18 1804) RLE Sensation: Full sensation (01/23/18 1804)      Effie Berkshire

## 2018-01-23 NOTE — Brief Op Note (Signed)
01/23/2018  1:20 PM  PATIENT:  [name]  84 y.o. male  PRE-OPERATIVE DIAGNOSIS:  Status post right periprosthetic femur fracutre, painful nonunion  POST-OPERATIVE DIAGNOSIS:  Status post right periprosthetic femur fracutre, painful nonunion  PROCEDURE:  Procedure(s) with comments: Redo open reduction internal fixation right periprosthetic femur fracture (Right) - 120 mins  SURGEON:  Surgeon(s) and Role:    Paralee Cancel, MD - Primary  PHYSICIAN ASSISTANT: Danae Orleans, PA-C  ANESTHESIA:   general  EBL:  150 cc  BLOOD ADMINISTERED:none  DRAINS: none   LOCAL MEDICATIONS USED:  NONE  SPECIMEN:  No Specimen  DISPOSITION OF SPECIMEN:  N/A  COUNTS:  YES  TOURNIQUET:  * No tourniquets in log *  DICTATION: .Other Dictation: Dictation Number [id number]  PLAN OF CARE: Admit to inpatient   PATIENT DISPOSITION:  PACU - hemodynamically stable.   Delay start of Pharmacological VTE agent (>24hrs) due to surgical blood loss or risk of bleeding: no

## 2018-01-23 NOTE — Progress Notes (Signed)
PHARMACY NOTE:  ANTIMICROBIAL RENAL DOSAGE ADJUSTMENT  Current antimicrobial regimen includes a mismatch between antimicrobial dosage and estimated renal function.  As per policy approved by the Pharmacy & Therapeutics and Medical Executive Committees, the antimicrobial dosage will be adjusted accordingly.  Current antimicrobial dosage: Cefazolin 2g IV q6h x 2 doses post-operatively   Indication: surgical prophylaxis   Renal Function:  Estimated Creatinine Clearance: 38.1 mL/min (A) (by C-G formula based on SCr of 1.54 mg/dL (H)). []      On intermittent HD, scheduled: []      On CRRT    Antimicrobial dosage has been changed to: Cefazolin 2g IV q8h x 2 doses post-operatively   Thank you for allowing pharmacy to be a part of this patient's care.  Luiz Ochoa, [facility] 01/23/2018 6:34 PM

## 2018-01-24 ENCOUNTER — Encounter (HOSPITAL_COMMUNITY): Payer: Self-pay | Admitting: Orthopedic Surgery

## 2018-01-24 LAB — BASIC METABOLIC PANEL
ANION GAP: 9 (ref 5–15)
BUN: 22 mg/dL (ref 8–23)
CALCIUM: 8.6 mg/dL — AB (ref 8.9–10.3)
CHLORIDE: 100 mmol/L (ref 98–111)
CO2: 24 mmol/L (ref 22–32)
Creatinine, Ser: 1.34 mg/dL — ABNORMAL HIGH (ref 0.61–1.24)
GFR, EST AFRICAN AMERICAN: 56 mL/min — AB (ref >60)
GFR, EST NON AFRICAN AMERICAN: 48 mL/min — AB (ref >60)
Glucose, Bld: 137 mg/dL — ABNORMAL HIGH (ref 70–99)
Potassium: 4.7 mmol/L (ref 3.5–5.1)
Sodium: 133 mmol/L — ABNORMAL LOW (ref 135–145)

## 2018-01-24 LAB — CBC
HEMATOCRIT: 30.4 % — AB (ref 39.0–52.0)
HEMOGLOBIN: 10.1 g/dL — AB (ref 13.0–17.0)
MCH: 29.2 pg (ref 26.0–34.0)
MCHC: 33.2 g/dL (ref 30.0–36.0)
MCV: 87.9 fL (ref 78.0–100.0)
Platelets: 249 10*3/uL (ref 150–400)
RBC: 3.46 MIL/uL — ABNORMAL LOW (ref 4.22–5.81)
RDW: 16.4 % — ABNORMAL HIGH (ref 11.5–15.5)
WBC: 6.1 10*3/uL (ref 4.0–10.5)

## 2018-01-24 NOTE — Evaluation (Signed)
Physical Therapy Evaluation Patient Details Name: [name] MRN: [id number] DOB: January 27, 1935 Today's Date: 01/24/2018   History of Present Illness  83 YO male s/p R ORIF periprosthetic femur fx revision (due to nonunion). 1st R ORIF 08/17/17. PMH notable for PVD, HTN, GERD, CAD, OA. Surgical history includes L heart cath and coronary angiography, L THA and R THA 1996, pacemaker.   Clinical Impression   Pt is a 83 YO male s/p R ORIF for periprosthetic femur fx revision. PMH as noted above. Pt presents with R hip and leg pain, difficulty performing bed mobility/transfers, ambulation deficits including decreased speed and antalgic gait, and decreased tolerance for ambulation. Pt would benefit from acute PT to address these deficits. PT recommending no PT follow up due to pt's prior experience with this surgery/rehabilitation and pt's wishes to return home with no follow up. PT will provide, demonstrate, and practice exercises at next session. Will continue to follow acutely.     Follow Up Recommendations No PT follow up;Follow surgeon's recommendation for DC plan and follow-up therapies    Equipment Recommendations  None recommended by PT    Recommendations for Other Services       Precautions / Restrictions Precautions Precautions: Fall Restrictions Weight Bearing Restrictions: No Other Position/Activity Restrictions: WBAT       Mobility  Bed Mobility Overal bed mobility: Needs Assistance Bed Mobility: Sit to Supine       Sit to supine: Min assist   General bed mobility comments: Assist for sequencing, RLE management, scooting to EOB.   Transfers Overall transfer level: Needs assistance Equipment used: Rolling [place] (2 wheeled) Transfers: Sit to/from Stand Sit to Stand: Min assist;From elevated surface         General transfer comment: Assist for  power up from bed, and steadying upon standing. Verbal cuing for hand placement and position in RW.    Ambulation/Gait Ambulation/Gait assistance: Min guard Gait Distance (Feet): 65 Feet Assistive device: Rolling [place] (2 wheeled) Gait Pattern/deviations: Step-to pattern;Decreased stride length;Antalgic;Decreased stance time - right;Decreased weight shift to right;Trunk flexed Gait velocity: decreased    General Gait Details: Verbal cuing for body placement in RW, trunk and hip extension. Min guard for safety.   Stairs            Wheelchair Mobility    Modified Rankin (Stroke Patients Only)       Balance Overall balance assessment: Needs assistance Sitting-balance support: Feet supported;No upper extremity supported Sitting balance-Leahy Scale: Fair     Standing balance support: Bilateral upper extremity supported Standing balance-Leahy Scale: Poor                               Pertinent Vitals/Pain Pain Assessment: 0-10 Pain Score: 5  Pain Location: R hip  Pain Descriptors / Indicators: Sore Pain Intervention(s): Limited activity within patient's tolerance;Repositioned;Ice applied;Monitored during session    [place] expects to be discharged to:: Private residence Living Arrangements: Spouse/significant other Available Help at Discharge: Family;Available 24 hours/day Type of Home: House Home Access: Level entry     Home Layout: One level Home Equipment: [place] - 2 wheels;Cane - single point Additional Comments: rollator     Prior Function Level of Independence: Independent with assistive device(s)         Comments: used RW for mobility prior to ORIF revision      Hand Dominance        Extremity/Trunk Assessment   Upper  Extremity Assessment Upper Extremity Assessment: Generalized weakness    Lower Extremity Assessment Lower Extremity Assessment: Generalized weakness       Communication   Communication: HOH  Cognition Arousal/Alertness: Awake/alert Behavior During Therapy: WFL for tasks  assessed/performed Overall Cognitive Status: Within Functional Limits for tasks assessed                                        General Comments      Exercises     Assessment/Plan    PT Assessment Patient needs continued PT services  PT Problem List Decreased strength;Decreased mobility;Decreased activity tolerance;Decreased balance;Decreased knowledge of use of DME       PT Treatment Interventions DME instruction;Therapeutic activities;Therapeutic exercise;Gait training;Patient/family education;Functional mobility training;Balance training    PT Goals (Current goals can be found in the Care Plan section)  Acute Rehab PT Goals PT Goal Formulation: With patient Time For Goal Achievement: 02/07/18 Potential to Achieve Goals: Good    Frequency 7X/week   Barriers to discharge        Co-evaluation               AM-PAC PT "6 Clicks" Daily Activity  Outcome Measure Difficulty turning over in bed (including adjusting bedclothes, sheets and blankets)?: A Lot Difficulty moving from lying on back to sitting on the side of the bed? : Unable Difficulty sitting down on and standing up from a chair with arms (e.g., wheelchair, bedside commode, etc,.)?: Unable Help needed moving to and from a bed to chair (including a wheelchair)?: A Little Help needed walking in hospital room?: A Little Help needed climbing 3-5 steps with a railing? : A Lot 6 Click Score: 12    End of Session Equipment Utilized During Treatment: Gait belt Activity Tolerance: Patient tolerated treatment well;Patient limited by fatigue;Patient limited by pain Patient left: in chair;with chair alarm set;with call bell/phone within reach Nurse Communication: Mobility status PT Visit Diagnosis: Other abnormalities of gait and mobility (R26.89);Difficulty in walking, not elsewhere classified (R26.2)    Time: 1779-3903 PT Time Calculation (min) (ACUTE ONLY): 25 min   Charges:   PT  Evaluation $PT Eval Low Complexity: 1 Low PT Treatments $Gait Training: 8-22 mins        [name], PT, DPT  Pager # [phone]    [name] 01/24/2018, 12:06 PM

## 2018-01-24 NOTE — Progress Notes (Signed)
Physical Therapy Treatment Patient Details Name: [name] MRN: [id number] DOB: 01/05/1935 Today's Date: 01/24/2018    History of Present Illness 83 YO male s/p R ORIF periprosthetic femur fx revision (due to nonunion). 1st R ORIF 08/17/17. PMH notable for PVD, HTN, GERD, CAD, OA. Surgical history includes L heart cath and coronary angiography, L THA and R THA 1996, pacemaker.     PT Comments    Pt with increased pain during mobility this afternoon. Many of the LE exercises reviewed and performed with the pt today required assist from PT due to pt weakness and post-operative pain. Pt also had difficulty with ambulation and bed mobility this session due to pain. Nursing notified of this, pt requesting pain medication. Will follow up tomorrow to continue to work on ambulation, LE exercises, and bed mobility tasks.    Follow Up Recommendations  No PT follow up;Follow surgeon's recommendation for DC plan and follow-up therapies     Equipment Recommendations  None recommended by PT    Recommendations for Other Services       Precautions / Restrictions Precautions Precautions: Fall Restrictions Weight Bearing Restrictions: No Other Position/Activity Restrictions: WBAT     Mobility  Bed Mobility Overal bed mobility: Needs Assistance Bed Mobility: Sit to Supine       Sit to supine: Mod assist;HOB elevated;+2 for physical assistance   General bed mobility comments: Up in chair upon PT arrival to room. Assist for RLE management, trunk lowering into bed, scooting back up in bed. Wife assisted with scooting up in bed.  Transfers Overall transfer level: Needs assistance Equipment used: Rolling [place] (2 wheeled) Transfers: Sit to/from Stand Sit to Stand: Min assist         General transfer comment: Assist with power up from chair, steadying upon standing. Verbal cuing for hand placement.   Ambulation/Gait Ambulation/Gait assistance: Min guard Gait Distance (Feet): 80  Feet Assistive device: Rolling [place] (2 wheeled) Gait Pattern/deviations: Step-to pattern;Antalgic;Decreased weight shift to right;Decreased stance time - right;Decreased stride length;Trunk flexed Gait velocity: decreased (increased with fatigue, reminded to focus on walking form and to slow down or take a standing rest break if needed)    General Gait Details: Mod verbal cuing for trunk/hip extension and stepping into RW, min guard for safety. 1 standing rest break required after 35 feet.    Stairs             Wheelchair Mobility    Modified Rankin (Stroke Patients Only)       Balance Overall balance assessment: Needs assistance Sitting-balance support: Feet supported;No upper extremity supported Sitting balance-Leahy Scale: Fair     Standing balance support: Bilateral upper extremity supported Standing balance-Leahy Scale: Poor                              Cognition Arousal/Alertness: Awake/alert Behavior During Therapy: WFL for tasks assessed/performed Overall Cognitive Status: Within Functional Limits for tasks assessed                                        Exercises General Exercises - Lower Extremity Ankle Circles/Pumps: AROM;20 reps;Both;Seated Quad Sets: AROM;Both;Seated;15 reps Short Arc Quad: AROM;Right;Seated;10 reps Long Arc Quad: AAROM;Right;Seated;10 reps Heel Slides: AAROM;Right;10 reps;Seated Hip ABduction/ADduction: AAROM;Right;10 reps;Seated    General Comments        Pertinent Vitals/Pain Pain Assessment: 0-10  Pain Score: 6  Pain Location: R hip and leg  Pain Descriptors / Indicators: Grimacing;Guarding;Sore Pain Intervention(s): Limited activity within patient's tolerance;Repositioned;Ice applied;Monitored during session;Patient requesting pain meds-RN notified    Home Living Family/patient expects to be discharged to:: Private residence Living Arrangements: Spouse/significant other Available Help at  Discharge: Family;Available 24 hours/day Type of Home: House Home Access: Level entry   Home Layout: One level Home Equipment: [place] - 2 wheels;Cane - single point Additional Comments: rollator     Prior Function Level of Independence: Independent with assistive device(s)      Comments: used RW for mobility prior to ORIF revision    PT Goals (current goals can now be found in the care plan section) Acute Rehab PT Goals PT Goal Formulation: With patient Time For Goal Achievement: 02/07/18 Potential to Achieve Goals: Good Progress towards PT goals: Progressing toward goals    Frequency    7X/week      PT Plan Current plan remains appropriate    Co-evaluation              AM-PAC PT "6 Clicks" Daily Activity  Outcome Measure  Difficulty turning over in bed (including adjusting bedclothes, sheets and blankets)?: A Lot Difficulty moving from lying on back to sitting on the side of the bed? : Unable Difficulty sitting down on and standing up from a chair with arms (e.g., wheelchair, bedside commode, etc,.)?: Unable Help needed moving to and from a bed to chair (including a wheelchair)?: A Little Help needed walking in hospital room?: A Little Help needed climbing 3-5 steps with a railing? : A Lot 6 Click Score: 12    End of Session Equipment Utilized During Treatment: Gait belt Activity Tolerance: Patient tolerated treatment well;Patient limited by fatigue;Patient limited by pain Patient left: in chair;with chair alarm set;with call bell/phone within reach Nurse Communication: Mobility status PT Visit Diagnosis: Other abnormalities of gait and mobility (R26.89);Difficulty in walking, not elsewhere classified (R26.2)     Time: 3435-6861 PT Time Calculation (min) (ACUTE ONLY): 35 min  Charges:  $Gait Training: 8-22 mins $Therapeutic Exercise: 8-22 mins                    Johneisha Broaden Conception Chancy, PT, DPT  Pager # [phone]     [place] 01/24/2018, 3:55 PM

## 2018-01-24 NOTE — Plan of Care (Signed)
Patient alert and oriented.  Pain assessed.  Fluids infusing.  Call bell within reach.  Due to void.  Side rails up x2.  Will continue to monitor. Problem: Education: Goal: Knowledge of General Education information will improve Description Including pain rating scale, medication(s)/side effects and non-pharmacologic comfort measures Outcome: Progressing   Problem: Health Behavior/Discharge Planning: Goal: Ability to manage health-related needs will improve Outcome: Progressing   Problem: Clinical Measurements: Goal: Ability to maintain clinical measurements within normal limits will improve Outcome: Progressing Goal: Will remain free from infection Outcome: Progressing Goal: Diagnostic test results will improve Outcome: Progressing Goal: Respiratory complications will improve Outcome: Progressing Goal: Cardiovascular complication will be avoided Outcome: Progressing   Problem: Activity: Goal: Risk for activity intolerance will decrease Outcome: Progressing   Problem: Nutrition: Goal: Adequate nutrition will be maintained Outcome: Progressing   Problem: Coping: Goal: Level of anxiety will decrease Outcome: Progressing   Problem: Elimination: Goal: Will not experience complications related to bowel motility Outcome: Progressing Goal: Will not experience complications related to urinary retention Outcome: Progressing   Problem: Pain Managment: Goal: General experience of comfort will improve Outcome: Progressing   Problem: Safety: Goal: Ability to remain free from injury will improve Outcome: Progressing   Problem: Skin Integrity: Goal: Risk for impaired skin integrity will decrease Outcome: Progressing

## 2018-01-24 NOTE — Progress Notes (Signed)
Patient ID: [name], male   DOB: 05-25-1935, 83 y.o.   MRN: [id number] Subjective: 1 Day Post-Op Procedure(s) (LRB): Redo open reduction internal fixation right periprosthetic femur fracture (Right)    Patient reports pain as mild to moderate. No events reported.  Objective:   VITALS:   Vitals:   01/24/18 0123 01/24/18 0603  BP: (!) 152/75 131/61  Pulse: 85 72  Resp: 16 16  Temp: 98 F (36.7 C) 98.7 F (37.1 C)  SpO2: 100% 98%    Neurovascular intact Incision: dressing C/D/I  Able to move right LE ok in bed this am actively  LABS Recent Labs    01/24/18 0420  HGB 10.1*  HCT 30.4*  WBC 6.1  PLT 249    Recent Labs    01/24/18 0420  NA 133*  K 4.7  BUN 22  CREATININE 1.34*  GLUCOSE 137*    No results for input(s): LABPT, INR in the last 72 hours.   Assessment/Plan: 1 Day Post-Op Procedure(s) (LRB): Redo open reduction internal fixation right periprosthetic femur fracture (Right)   Advance diet Up with therapy Plan for discharge tomorrow or Thursday depending on activity with PT WBAT DVT prophylaxis

## 2018-01-25 DIAGNOSIS — E669 Obesity, unspecified: Secondary | ICD-10-CM | POA: Diagnosis present

## 2018-01-25 HISTORY — DX: Obesity, unspecified: E66.9

## 2018-01-25 LAB — BASIC METABOLIC PANEL
ANION GAP: 8 (ref 5–15)
BUN: 26 mg/dL — ABNORMAL HIGH (ref 8–23)
CO2: 23 mmol/L (ref 22–32)
Calcium: 8.9 mg/dL (ref 8.9–10.3)
Chloride: 105 mmol/L (ref 98–111)
Creatinine, Ser: 1.33 mg/dL — ABNORMAL HIGH (ref 0.61–1.24)
GFR, EST AFRICAN AMERICAN: 57 mL/min — AB (ref >60)
GFR, EST NON AFRICAN AMERICAN: 49 mL/min — AB (ref >60)
Glucose, Bld: 170 mg/dL — ABNORMAL HIGH (ref 70–99)
POTASSIUM: 4.7 mmol/L (ref 3.5–5.1)
Sodium: 136 mmol/L (ref 135–145)

## 2018-01-25 LAB — CBC
HCT: 28.9 % — ABNORMAL LOW (ref 39.0–52.0)
Hemoglobin: 9.7 g/dL — ABNORMAL LOW (ref 13.0–17.0)
MCH: 28.8 pg (ref 26.0–34.0)
MCHC: 33.6 g/dL (ref 30.0–36.0)
MCV: 85.8 fL (ref 78.0–100.0)
PLATELETS: 234 10*3/uL (ref 150–400)
RBC: 3.37 MIL/uL — AB (ref 4.22–5.81)
RDW: 16.5 % — ABNORMAL HIGH (ref 11.5–15.5)
WBC: 7.5 10*3/uL (ref 4.0–10.5)

## 2018-01-25 MED ORDER — FERROUS SULFATE 325 (65 FE) MG PO TABS: 325.0000 mg | ORAL_TABLET | Freq: Three times a day (TID) | ORAL | 3 refills | Status: DC

## 2018-01-25 MED ORDER — METHOCARBAMOL 500 MG PO TABS: 500.0000 mg | ORAL_TABLET | Freq: Four times a day (QID) | ORAL | 0 refills | Status: DC | PRN

## 2018-01-25 MED ORDER — DOCUSATE SODIUM 100 MG PO CAPS: 100.0000 mg | ORAL_CAPSULE | Freq: Two times a day (BID) | ORAL | 0 refills | Status: DC

## 2018-01-25 MED ORDER — HYDROCODONE-ACETAMINOPHEN 7.5-325 MG PO TABS: 1.0000 | ORAL_TABLET | ORAL | 0 refills | Status: DC | PRN

## 2018-01-25 MED ORDER — POLYETHYLENE GLYCOL 3350 17 G PO PACK: 17.0000 g | PACK | Freq: Two times a day (BID) | ORAL | 0 refills | Status: DC

## 2018-01-25 NOTE — Discharge Summary (Signed)
Physician Discharge Summary  Patient ID: [name] MRN: [id number] DOB/AGE: Jun 12, 1935 83 y.o.  Admit date: 01/23/2018 Discharge date:  01/25/2018  Procedures:  Procedure(s) (LRB): Redo open reduction internal fixation right periprosthetic femur fracture (Right)  Attending Physician:  Dr. Paralee Cancel   Admission Diagnoses:   S/P right periprosthetic femur fracture / nonunion   Discharge Diagnoses:  Active Problems:   Femur fracture, right (HCC)   Obese  Past Medical History:  Diagnosis Date  . Arthritis   . Asthma   . Coronary artery disease   . GERD (gastroesophageal reflux disease)   . Hypertension   . Peripheral vascular disease (HCC)     HPI:    Pt is a 83 y.o. male complaining of right femur pain for 3-4 months.  Pain had continually increased during that time.  X-rays in the clinic show failure of previous ORIF. Pt has tried various conservative treatments which have failed to alleviate their symptoms, including NSAIDs/analgesic meds, cortisone injection, activity modification and use of assistance device. Pain is 10 out of 10 at it's worse. He is effected by the pain during activity and at night.  Various options are discussed with the patient. Risks, benefits and expectations were discussed with the patient. Patient understand the risks, benefits and expectations and wishes to proceed with surgery.  PCP: Lillard Anes, MD   Discharged Condition: good  Hospital Course:  Patient underwent the above stated procedure on 01/23/2018. Patient tolerated the procedure well and brought to the recovery room in good condition and subsequently to the floor.  POD #1 BP: 131/61 ; Pulse: 72 ; Temp: 98.7 F (37.1 C) ; Resp: 16 Patient reports pain as mild to moderate. No events reported. Neurovascular intact and incision: dressing C/D/I. Able to move right LE ok in bed this am actively  LABS  Basename    HGB     10.1  HCT     30.4   POD #2  BP: 161/73 ; Pulse:  77 ; Temp: 97.6 F (36.4 C) ; Resp: 18 Patient reports pain as mild at rest.  No reported events throughout the night. Feels that he is progressing well and ready to be discharged home.  Dorsiflexion/plantar flexion intact, incision: dressing C/D/I, no cellulitis present and compartment soft.   LABS  Basename    HGB     9.7  HCT     28.9    Discharge Exam: General appearance: alert, cooperative and no distress Extremities: Homans sign is negative, no sign of DVT, no edema, redness or tenderness in the calves or thighs and no ulcers, gangrene or trophic changes  Disposition: Home with follow up in 2 weeks   Follow-up Information    Paralee Cancel, MD. Schedule an appointment as soon as possible for a visit in 2 weeks.   Specialty:  Orthopedic Surgery Contact information: [address] [place] [address] [phone]           Discharge Instructions    Call MD / Call 911   Complete by:  As directed    If you experience chest pain or shortness of breath, CALL 911 and be transported to the hospital emergency room.  If you develope a fever above 101 F, pus (white drainage) or increased drainage or redness at the wound, or calf pain, call your surgeon's office.   Change dressing   Complete by:  As directed    Maintain surgical dressing until follow up in the clinic.  If the edges start to pull up, may reinforce with tape. If the dressing is no longer working, may remove and cover with gauze and tape, but must keep the area dry and clean.  Call with any questions or concerns.   Constipation Prevention   Complete by:  As directed    Drink plenty of fluids.  Prune juice may be helpful.  You may use a stool softener, such as Colace (over the counter) 100 mg twice a day.  Use MiraLax (over the counter) for constipation as needed.   Diet - low sodium heart healthy   Complete by:  As directed    Discharge instructions   Complete by:  As directed    Maintain surgical  dressing until follow up in the clinic. If the edges start to pull up, may reinforce with tape. If the dressing is no longer working, may remove and cover with gauze and tape, but must keep the area dry and clean.  Follow up in 2 weeks at [facility]. Call with any questions or concerns.   Increase activity slowly as tolerated   Complete by:  As directed    Weight bearing as tolerated with assist device (Beddow, cane, etc) as directed, use it as long as suggested by your surgeon or therapist, typically at least 4-6 weeks.   TED hose   Complete by:  As directed    Use stockings (TED hose) for 2 weeks on both leg(s).  You may remove them at night for sleeping.      Allergies as of 01/25/2018   No Known Allergies     Medication List    TAKE these medications   albuterol 108 (90 Base) MCG/ACT inhaler Commonly known as:  PROVENTIL HFA;VENTOLIN HFA Inhale 2 puffs into the lungs 4 (four) times daily as needed for wheezing or shortness of breath.   amLODipine 5 MG tablet Commonly known as:  NORVASC TAKE 1 TABLET(5 MG) BY MOUTH DAILY   apixaban 5 MG Tabs tablet Commonly known as:  ELIQUIS Take 1 tablet (5 mg total) by mouth 2 (two) times daily.   atorvastatin 40 MG tablet Commonly known as:  LIPITOR Take 40 mg by mouth daily.   CALCIUM 500 + D PO Take 1 tablet by mouth daily.   chlorthalidone 25 MG tablet Commonly known as:  HYGROTON Take 1 tablet (25 mg total) by mouth daily.   docusate sodium 100 MG capsule Commonly known as:  COLACE Take 1 capsule (100 mg total) by mouth 2 (two) times daily.   ferrous sulfate 325 (65 FE) MG tablet Commonly known as:  FERROUSUL Take 1 tablet (325 mg total) by mouth 3 (three) times daily with meals.   fluticasone 110 MCG/ACT inhaler Commonly known as:  FLOVENT HFA Inhale 2 puffs into the lungs 2 (two) times daily.   HYDROcodone-acetaminophen 7.5-325 MG tablet Commonly known as:  NORCO Take 1-2 tablets by mouth every 4 (four)  hours as needed for moderate pain.   isosorbide mononitrate 60 MG 24 hr tablet Commonly known as:  IMDUR Take 60 mg by mouth daily.   levothyroxine 50 MCG tablet Commonly known as:  SYNTHROID, LEVOTHROID Take 50 mcg by mouth daily before breakfast.   methocarbamol 500 MG tablet Commonly known as:  ROBAXIN Take 1 tablet (500 mg total) by mouth every 6 (six) hours as needed for muscle spasms.   MULTIVITAMIN PO Take 1 tablet by mouth daily.   nitroGLYCERIN 0.4 MG SL tablet Commonly known as:  NITROSTAT Place 1 tablet (0.4 mg total) under the tongue every 5 (five) minutes as needed for chest pain.   omeprazole 20 MG tablet Commonly known as:  PRILOSEC OTC Take 2 tablets (40 mg total) by mouth daily. What changed:  how much to take   polyethylene glycol packet Commonly known as:  MIRALAX / GLYCOLAX Take 17 g by mouth 2 (two) times daily.   ranolazine 500 MG 12 hr tablet Commonly known as:  RANEXA Take 1 tablet (500 mg total) by mouth 2 (two) times daily.   vitamin B-12 1000 MCG tablet Commonly known as:  CYANOCOBALAMIN Take 1,000 mcg by mouth daily.            Discharge Care Instructions  (From admission, onward)        Start     Ordered   01/25/18 0000  Change dressing    Comments:  Maintain surgical dressing until follow up in the clinic. If the edges start to pull up, may reinforce with tape. If the dressing is no longer working, may remove and cover with gauze and tape, but must keep the area dry and clean.  Call with any questions or concerns.   01/25/18 0844       Signed: West Pugh. Camil Hausmann   PA-C  01/25/2018, 8:48 AM

## 2018-01-25 NOTE — Progress Notes (Signed)
Physical Therapy Treatment Patient Details Name: [name] MRN: [id number] DOB: 30-Jun-1934 Today's Date: 01/25/2018    History of Present Illness 83 YO male s/p R ORIF periprosthetic femur fx revision (due to nonunion). 1st R ORIF 08/17/17. PMH notable for PVD, HTN, GERD, CAD, OA. Surgical history includes L heart cath and coronary angiography, L THA and R THA 1996, pacemaker.     PT Comments    Pt with improved tolerance for ambulation and exercises today with less complaint of RLE pain. Pt with some burning around incision during short arc quad exercise, which subsided with rest. Pt demonstrating proficiency in performing all exercises, with minor verbal cuing for standing exercises. Pt advised to use countertop at home for all standing exercises for UE support. Wife supportive and helpful with mobility. Pt met all PT goals and vocalizes that he is ready to d/c home today. Nursing notified of improved mobility status.    Follow Up Recommendations  No PT follow up;Follow surgeon's recommendation for DC plan and follow-up therapies     Equipment Recommendations  None recommended by PT    Recommendations for Other Services       Precautions / Restrictions Precautions Precautions: Fall Restrictions Weight Bearing Restrictions: No Other Position/Activity Restrictions: WBAT     Mobility  Bed Mobility Overal bed mobility: Needs Assistance Bed Mobility: Supine to Sit     Supine to sit: Min guard     General bed mobility comments: Increased time to perform bed mobility. Min guard provided for safety  Transfers Overall transfer level: Needs assistance Equipment used: Rolling Redinger (2 wheeled) Transfers: Sit to/from Stand Sit to Stand: Min guard         General transfer comment: Sit to stand x2. Min guard for safety, increased time to complete. Proper sequencing and hand placement demonstrated.   Ambulation/Gait Ambulation/Gait assistance: Min guard Gait Distance  (Feet): 120 Feet Assistive device: Rolling [place]au (2 wheeled) Gait Pattern/deviations: Step-to pattern;Antalgic;Decreased weight shift to right;Decreased stance time - right;Decreased stride length;Trunk flexed Gait velocity: slightly decreased    General Gait Details: Trunk less flexed today and required less verbal cuing to correct. R foot out-toeing with foot progression angle of approxiamtely 15 degrees, which pt states is due to his R knee and is painful to turn the foot in.     Stairs             Wheelchair Mobility    Modified Rankin (Stroke Patients Only)       Balance                                            Cognition Arousal/Alertness: Awake/alert Behavior During Therapy: WFL for tasks assessed/performed Overall Cognitive Status: Within Functional Limits for tasks assessed                                        Exercises      General Comments        Pertinent Vitals/Pain Pain Assessment: 0-10 Pain Score: 5  Pain Location: R hip and leg  Pain Descriptors / Indicators: Aching;Burning;Sore Pain Intervention(s): Limited activity within patient's tolerance;Repositioned;Monitored during session    Home Living  Prior Function            PT Goals (current goals can now be found in the care plan section) Acute Rehab PT Goals PT Goal Formulation: With patient Time For Goal Achievement: 02/07/18 Potential to Achieve Goals: Good Progress towards PT goals: Progressing toward goals    Frequency    7X/week      PT Plan Current plan remains appropriate    Co-evaluation              AM-PAC PT "6 Clicks" Daily Activity  Outcome Measure  Difficulty turning over in bed (including adjusting bedclothes, sheets and blankets)?: A Little Difficulty moving from lying on back to sitting on the side of the bed? : A Little Difficulty sitting down on and standing up from a chair with arms  (e.g., wheelchair, bedside commode, etc,.)?: A Little Help needed moving to and from a bed to chair (including a wheelchair)?: A Little Help needed walking in hospital room?: A Little Help needed climbing 3-5 steps with a railing? : A Lot 6 Click Score: 17    End of Session Equipment Utilized During Treatment: Gait belt Activity Tolerance: Patient tolerated treatment well;Patient limited by fatigue;Patient limited by pain Patient left: in chair;with chair alarm set;with call bell/phone within reach Nurse Communication: Mobility status PT Visit Diagnosis: Other abnormalities of gait and mobility (R26.89);Difficulty in walking, not elsewhere classified (R26.2)     Time: 2395-3202 PT Time Calculation (min) (ACUTE ONLY): 27 min  Charges:  $Gait Training: 8-22 mins $Therapeutic Exercise: 8-22 mins                     Clay Solum Conception Chancy, PT, DPT  Pager # [phone]     [place] 01/25/2018, 10:59 AM

## 2018-01-25 NOTE — Progress Notes (Signed)
     Subjective: 2 Days Post-Op Procedure(s) (LRB): Redo open reduction internal fixation right periprosthetic femur fracture (Right)    Seen by Dr. Alvan Dame. Patient reports pain as mild at rest.  No reported events throughout the night. Feels that he is progressing well and ready to be discharged home.    Objective:   VITALS:   Vitals:   01/24/18 2138 01/25/18 0621  BP: 134/65 (!) 161/73  Pulse: 78 77  Resp: 18 18  Temp: 98.8 F (37.1 C) 97.6 F (36.4 C)  SpO2: 94% 96%    Dorsiflexion/Plantar flexion intact Incision: dressing C/D/I No cellulitis present Compartment soft  LABS Recent Labs    01/24/18 0420 01/25/18 0454  HGB 10.1* 9.7*  HCT 30.4* 28.9*  WBC 6.1 7.5  PLT 249 234    Recent Labs    01/24/18 0420 01/25/18 0454  NA 133* 136  K 4.7 4.7  BUN 22 26*  CREATININE 1.34* 1.33*  GLUCOSE 137* 170*     Assessment/Plan: 2 Days Post-Op Procedure(s) (LRB): Redo open reduction internal fixation right periprosthetic femur fracture (Right) Up with therapy Discharge home Follow up in 2 weeks at [facility] Follow up with OLIN,Danean Marner D in 2 weeks.  Contact information:  [facility]) [address], [place] [phone]    Obese (BMI 30-39.9) Estimated body mass index is 33.04 kg/m as calculated from the following:   Height as of this encounter: 5\' 4"  (1.626 m).   Weight as of this encounter: 87.3 kg (192 lb 7.4 oz). Patient also counseled that weight may inhibit the healing process Patient counseled that losing weight will help with future health issues       West Pugh. Soren Pigman   PAC  01/25/2018, 8:31 AM

## 2018-01-25 NOTE — Progress Notes (Signed)
Discharge instructions given to pt and all questions were answered. Pt left via wheelchair and wife picked him up.

## 2018-02-21 ENCOUNTER — Other Ambulatory Visit: Payer: Self-pay

## 2018-02-21 ENCOUNTER — Other Ambulatory Visit: Payer: Self-pay | Admitting: Cardiology

## 2018-02-22 DIAGNOSIS — Z4789 Encounter for other orthopedic aftercare: Secondary | ICD-10-CM | POA: Diagnosis not present

## 2018-02-27 DIAGNOSIS — I442 Atrioventricular block, complete: Secondary | ICD-10-CM | POA: Diagnosis not present

## 2018-02-27 DIAGNOSIS — Z45018 Encounter for adjustment and management of other part of cardiac pacemaker: Secondary | ICD-10-CM | POA: Diagnosis not present

## 2018-03-06 ENCOUNTER — Other Ambulatory Visit: Payer: Self-pay | Admitting: Cardiology

## 2018-03-07 ENCOUNTER — Other Ambulatory Visit: Payer: Self-pay | Admitting: Cardiology

## 2018-03-22 DIAGNOSIS — Z4789 Encounter for other orthopedic aftercare: Secondary | ICD-10-CM | POA: Diagnosis not present

## 2018-03-22 DIAGNOSIS — S7291XG Unspecified fracture of right femur, subsequent encounter for closed fracture with delayed healing: Secondary | ICD-10-CM | POA: Diagnosis not present

## 2018-04-04 DIAGNOSIS — I2510 Atherosclerotic heart disease of native coronary artery without angina pectoris: Secondary | ICD-10-CM | POA: Diagnosis not present

## 2018-04-04 DIAGNOSIS — M170 Bilateral primary osteoarthritis of knee: Secondary | ICD-10-CM | POA: Diagnosis not present

## 2018-04-04 DIAGNOSIS — Z6828 Body mass index (BMI) 28.0-28.9, adult: Secondary | ICD-10-CM | POA: Diagnosis not present

## 2018-04-04 DIAGNOSIS — I69351 Hemiplegia and hemiparesis following cerebral infarction affecting right dominant side: Secondary | ICD-10-CM | POA: Diagnosis not present

## 2018-04-04 DIAGNOSIS — I69314 Frontal lobe and executive function deficit following cerebral infarction: Secondary | ICD-10-CM | POA: Diagnosis not present

## 2018-04-04 DIAGNOSIS — I739 Peripheral vascular disease, unspecified: Secondary | ICD-10-CM | POA: Diagnosis not present

## 2018-04-04 DIAGNOSIS — I10 Essential (primary) hypertension: Secondary | ICD-10-CM | POA: Diagnosis not present

## 2018-04-04 DIAGNOSIS — E782 Mixed hyperlipidemia: Secondary | ICD-10-CM | POA: Diagnosis not present

## 2018-04-04 DIAGNOSIS — E038 Other specified hypothyroidism: Secondary | ICD-10-CM | POA: Diagnosis not present

## 2018-04-20 ENCOUNTER — Other Ambulatory Visit: Payer: Self-pay | Admitting: Cardiology

## 2018-05-25 ENCOUNTER — Other Ambulatory Visit: Payer: Self-pay | Admitting: Emergency Medicine

## 2018-05-25 MED ORDER — AMLODIPINE BESYLATE 5 MG PO TABS: ORAL_TABLET | ORAL | 0 refills | Status: DC

## 2018-05-29 DIAGNOSIS — Z950 Presence of cardiac pacemaker: Secondary | ICD-10-CM | POA: Diagnosis not present

## 2018-06-05 ENCOUNTER — Ambulatory Visit (INDEPENDENT_AMBULATORY_CARE_PROVIDER_SITE_OTHER): Payer: PPO | Admitting: Cardiology

## 2018-06-05 ENCOUNTER — Encounter: Payer: Self-pay | Admitting: Cardiology

## 2018-06-05 VITALS — BP 140/68 | HR 77 | Ht 66.0 in | Wt 178.8 lb

## 2018-06-05 DIAGNOSIS — Z45018 Encounter for adjustment and management of other part of cardiac pacemaker: Secondary | ICD-10-CM

## 2018-06-05 DIAGNOSIS — I442 Atrioventricular block, complete: Secondary | ICD-10-CM

## 2018-06-05 DIAGNOSIS — I6930 Unspecified sequelae of cerebral infarction: Secondary | ICD-10-CM | POA: Diagnosis not present

## 2018-06-05 NOTE — Progress Notes (Signed)
Cardiology Office Note:    Date:  06/05/2018   ID:  [name], DOB 09/27/1934, MRN [id number]  PCP:  Lillard Anes, MD  Cardiologist:  Jenne Campus, MD    Referring MD: Lillard Anes,*   Chief Complaint  Patient presents with  . Follow-up  Doing well cardiac wise  History of Present Illness:    [name] is a 83 y.o. male with paroxysmal atrial fibrillation, anticoagulated which I will continue.  Comes today to my office for follow-up cardiac wise doing well the biggest problem he had his problem with his leg sustained fracture and then required another surgery to fix it.  Denies have any cardiac complication around surgical time.  Past Medical History:  Diagnosis Date  . Arthritis   . Asthma   . Coronary artery disease   . GERD (gastroesophageal reflux disease)   . Hypertension   . Peripheral vascular disease [facility])     Past Surgical History:  Procedure Laterality Date  . CATARACT EXTRACTION    . HIP SURGERY    . INSERT / REPLACE / REMOVE PACEMAKER     [employer]  . JOINT REPLACEMENT Right 10/20/1994   Hip  (Left Hip in 10/96)  . LEFT HEART CATH AND CORONARY ANGIOGRAPHY N/A 02/08/2017   Procedure: LEFT HEART CATH AND CORONARY ANGIOGRAPHY;  Surgeon: Martinique, Peter M, MD;  Location: [facility];  Service: Cardiovascular;  Laterality: N/A;  . ORIF PERIPROSTHETIC FRACTURE Right 08/17/2017   Procedure: OPEN REDUCTION INTERNAL FIXATION (ORIF) PERIPROSTHETIC FRACTURE RIGHT FEMUR;  Surgeon: Paralee Cancel, MD;  Location: [facility];  Service: Orthopedics;  Laterality: Right;  . ORIF PERIPROSTHETIC FRACTURE Right 01/23/2018   Procedure: Redo open reduction internal fixation right periprosthetic femur fracture;  Surgeon: Paralee Cancel, MD;  Location: [facility];  Service: Orthopedics;  Laterality: Right;  120 mins    Current Medications: Current Meds  Medication Sig  . albuterol (PROVENTIL HFA;VENTOLIN HFA) 108 (90 Base) MCG/ACT inhaler Inhale 2  puffs into the lungs 4 (four) times daily as needed for wheezing or shortness of breath.  Marland Kitchen amLODipine (NORVASC) 5 MG tablet TAKE 1 TABLET(5 MG) BY MOUTH DAILY  . atorvastatin (LIPITOR) 40 MG tablet Take 40 mg by mouth daily.  . Calcium Carbonate-Vitamin D (CALCIUM 500 + D PO) Take 1 tablet by mouth daily.  . chlorthalidone (HYGROTON) 25 MG tablet Take 1 tablet (25 mg total) by mouth daily.  Marland Kitchen ELIQUIS 5 MG TABS tablet TAKE 1 TABLET(5 MG) BY MOUTH TWICE DAILY  . fluticasone (FLOVENT HFA) 110 MCG/ACT inhaler Inhale 2 puffs into the lungs 2 (two) times daily.   . isosorbide mononitrate (IMDUR) 60 MG 24 hr tablet Take 60 mg by mouth daily.   Marland Kitchen levothyroxine (SYNTHROID, LEVOTHROID) 50 MCG tablet Take 50 mcg by mouth daily before breakfast.  . Multiple Vitamins-Minerals (MULTIVITAMIN PO) Take 1 tablet by mouth daily.  . nitroGLYCERIN (NITROSTAT) 0.4 MG SL tablet DISSOLVE 1 TABLET UNDER THE TONGUE EVERY 5 MINUTES AS NEEDED FOR CHEST PAIN. MAX 3 DOSES IN 15 MINUTES  . omeprazole (PRILOSEC OTC) 20 MG tablet Take 2 tablets (40 mg total) by mouth daily. (Patient taking differently: Take 20 mg by mouth daily. )  . ranolazine (RANEXA) 500 MG 12 hr tablet TAKE 1 TABLET(500 MG) BY MOUTH TWICE DAILY  . vitamin B-12 (CYANOCOBALAMIN) 1000 MCG tablet Take 1,000 mcg by mouth daily.      Allergies:   Patient has no known allergies.   Social History  Socioeconomic History  . Marital status: Married    Spouse name: Not on file  . Number of children: Not on file  . Years of education: Not on file  . Highest education level: Not on file  Occupational History  . Not on file  Social Needs  . Financial resource strain: Not on file  . Food insecurity:    Worry: Not on file    Inability: Not on file  . Transportation needs:    Medical: Not on file    Non-medical: Not on file  Tobacco Use  . Smoking status: Never Smoker  . Smokeless tobacco: Never Used  Substance and Sexual Activity  . Alcohol use: Yes     Alcohol/week: 1.0 standard drinks    Types: 1 Cans of beer per week    Comment: occ  . Drug use: No  . Sexual activity: Not on file  Lifestyle  . Physical activity:    Days per week: Not on file    Minutes per session: Not on file  . Stress: Not on file  Relationships  . Social connections:    Talks on phone: Not on file    Gets together: Not on file    Attends religious service: Not on file    Active member of club or organization: Not on file    Attends meetings of clubs or organizations: Not on file    Relationship status: Not on file  Other Topics Concern  . Not on file  Social History Narrative  . Not on file     Family History: The patient's family history includes Heart attack in his mother. ROS:   Please see the history of present illness.    All 14 point review of systems negative except as described per history of present illness  EKGs/Labs/Other Studies Reviewed:      Recent Labs: 08/19/2017: Magnesium 1.6 01/25/2018: BUN 26; Creatinine, Ser 1.33; Hemoglobin 9.7; Platelets 234; Potassium 4.7; Sodium 136  Recent Lipid Panel    Component Value Date/Time   CHOL 172 02/08/2017 0204   TRIG 176 (H) 02/08/2017 0204   HDL 43 02/08/2017 0204   CHOLHDL 4.0 02/08/2017 0204   VLDL 35 02/08/2017 0204   LDLCALC 94 02/08/2017 0204    Physical Exam:    VS:  BP 140/68   Pulse 77   Ht 5\' 6"  (1.676 m)   Wt 178 lb 12.8 oz (81.1 kg)   SpO2 97%   BMI 28.86 kg/m     Wt Readings from Last 3 Encounters:  06/05/18 178 lb 12.8 oz (81.1 kg)  01/24/18 192 lb 7.4 oz (87.3 kg)  01/20/18 183 lb 8 oz (83.2 kg)     GEN:  Well nourished, well developed in no acute distress HEENT: Normal NECK: No JVD; No carotid bruits LYMPHATICS: No lymphadenopathy CARDIAC: RRR, no murmurs, no rubs, no gallops RESPIRATORY:  Clear to auscultation without rales, wheezing or rhonchi  ABDOMEN: Soft, non-tender, non-distended MUSCULOSKELETAL:  No edema; No deformity  SKIN: Warm and dry LOWER  EXTREMITIES: no swelling NEUROLOGIC:  Alert and oriented x 3 PSYCHIATRIC:  Normal affect   ASSESSMENT:    1. Complete heart block ([place])   2. Pacemaker reprogramming/check   3. Late effect of cerebrovascular accident (CVA)    PLAN:    In order of problems listed above:  1. Pacemaker present.  Patient will be referred to pacemaker clinic 2. Late effect of CVA no new issues. 3. Dyslipidemia followed by primary care physician.  4. Essential hypertension stable.  Cardiac wise doing well I see him back 6 months he be enrolled in our pacemaker clinic   Medication Adjustments/Labs and Tests Ordered: Current medicines are reviewed at length with the patient today.  Concerns regarding medicines are outlined above.  No orders of the defined types were placed in this encounter.  Medication changes: No orders of the defined types were placed in this encounter.   Signed, Park Liter, MD, [facility] 06/05/2018 12:11 PM    [place]

## 2018-06-05 NOTE — Patient Instructions (Signed)
Medication Instructions:  Your physician recommends that you continue on your current medications as directed. Please refer to the Current Medication list given to you today.  If you need a refill on your cardiac medications before your next appointment, please call your pharmacy.   Lab work: None ordered If you have labs (blood work) drawn today and your tests are completely normal, you will receive your results only by: Marland Kitchen MyChart Message (if you have MyChart) OR . A paper copy in the mail If you have any lab test that is abnormal or we need to change your treatment, we will call you to review the results.  Testing/Procedures: None ordered  Follow-Up: At [facility], you and your health needs are our priority.  As part of our continuing mission to provide you with exceptional heart care, we have created designated Provider Care Teams.  These Care Teams include your primary Cardiologist (physician) and Advanced Practice Providers (APPs -  Physician Assistants and Nurse Practitioners) who all work together to provide you with the care you need, when you need it. Follow up in 6 months with Dr. Agustin Cree.  Any Other Special Instructions Will Be Listed Below (If Applicable).

## 2018-07-20 ENCOUNTER — Encounter: Payer: Self-pay | Admitting: *Deleted

## 2018-07-31 ENCOUNTER — Encounter: Payer: Self-pay | Admitting: Cardiology

## 2018-07-31 ENCOUNTER — Ambulatory Visit (INDEPENDENT_AMBULATORY_CARE_PROVIDER_SITE_OTHER): Payer: PPO | Admitting: Cardiology

## 2018-07-31 VITALS — BP 126/60 | HR 88 | Ht 66.0 in | Wt 185.0 lb

## 2018-07-31 DIAGNOSIS — I442 Atrioventricular block, complete: Secondary | ICD-10-CM | POA: Diagnosis not present

## 2018-07-31 NOTE — Patient Instructions (Signed)
Medication Instructions:  Your physician recommends that you continue on your current medications as directed. Please refer to the Current Medication list given to you today.  *If you need a refill on your cardiac medications before your next appointment, please call your pharmacy*  Labwork: None ordered  Testing/Procedures: None ordered  Follow-Up: Remote monitoring is used to monitor your Pacemaker or ICD from home. This monitoring reduces the number of office visits required to check your device to one time per year. It allows [place] to keep an eye on the functioning of your device to ensure it is working properly. You are scheduled for a device check from home on 10/30/2018. You may send your transmission at any time that day. If you have a wireless device, the transmission will be sent automatically. After your physician reviews your transmission, you will receive a postcard with your next transmission date.  Your physician wants you to follow-up in: 1 year with Dr. Curt Bears.  You will receive a reminder letter in the mail two months in advance. If you don't receive a letter, please call our office to schedule the follow-up appointment.  Thank you for choosing [facility]!!   Trinidad Curet, RN [phone]  Any Other Special Instructions Will Be Listed Below (If Applicable).  Device clinic #   [id number]

## 2018-07-31 NOTE — Progress Notes (Signed)
Electrophysiology Office Note   Date:  07/31/2018   ID:  NYXON STRUPP, DOB 10/26/1934, MRN [id number]  PCP:  Lillard Anes, MD  Cardiologist: Agustin Cree Primary Electrophysiologist:  Ervan Heber Meredith Leeds, MD    No chief complaint on file.    History of Present Illness: [name] is a 84 y.o. male who is being seen today for the evaluation of complete AV block at the request of Jenne Campus. Presenting today for electrophysiology evaluation.  He has a history of coronary artery disease, hypertension, PVD, and complete heart block status post Medtronic dual-chamber pacemaker.    Today, he denies symptoms of palpitations, chest pain, shortness of breath, orthopnea, PND, lower extremity edema, claudication, dizziness, presyncope, syncope, bleeding, or neurologic sequela. The patient is tolerating medications without difficulties.    Past Medical History:  Diagnosis Date  . Arthritis   . Asthma   . Coronary artery disease   . GERD (gastroesophageal reflux disease)   . Hypertension   . Peripheral vascular disease [facility])    Past Surgical History:  Procedure Laterality Date  . CATARACT EXTRACTION    . HIP SURGERY    . INSERT / REPLACE / REMOVE PACEMAKER     Medtronic  . JOINT REPLACEMENT Right 10/20/1994   Hip  (Left Hip in 10/96)  . LEFT HEART CATH AND CORONARY ANGIOGRAPHY N/A 02/08/2017   Procedure: LEFT HEART CATH AND CORONARY ANGIOGRAPHY;  Surgeon: Martinique, Peter M, MD;  Location: [place] [facility];  Service: Cardiovascular;  Laterality: N/A;  . ORIF PERIPROSTHETIC FRACTURE Right 08/17/2017   Procedure: OPEN REDUCTION INTERNAL FIXATION (ORIF) PERIPROSTHETIC FRACTURE RIGHT FEMUR;  Surgeon: Paralee Cancel, MD;  Location: [facility];  Service: Orthopedics;  Laterality: Right;  . ORIF PERIPROSTHETIC FRACTURE Right 01/23/2018   Procedure: Redo open reduction internal fixation right periprosthetic femur fracture;  Surgeon: Paralee Cancel, MD;  Location: [facility];   Service: Orthopedics;  Laterality: Right;  120 mins     Current Outpatient Medications  Medication Sig Dispense Refill  . albuterol (PROVENTIL HFA;VENTOLIN HFA) 108 (90 Base) MCG/ACT inhaler Inhale 2 puffs into the lungs 4 (four) times daily as needed for wheezing or shortness of breath.    Marland Kitchen amLODipine (NORVASC) 5 MG tablet TAKE 1 TABLET(5 MG) BY MOUTH DAILY 90 tablet 0  . atorvastatin (LIPITOR) 40 MG tablet Take 40 mg by mouth daily.    . Calcium Carbonate-Vitamin D (CALCIUM 500 + D PO) Take 1 tablet by mouth daily.    . chlorthalidone (HYGROTON) 25 MG tablet Take 1 tablet (25 mg total) by mouth daily. 30 tablet 1  . ELIQUIS 5 MG TABS tablet TAKE 1 TABLET(5 MG) BY MOUTH TWICE DAILY 60 tablet 7  . fluticasone (FLOVENT HFA) 110 MCG/ACT inhaler Inhale 2 puffs into the lungs 2 (two) times daily.     . isosorbide mononitrate (IMDUR) 60 MG 24 hr tablet Take 60 mg by mouth daily.     Marland Kitchen levothyroxine (SYNTHROID, LEVOTHROID) 50 MCG tablet Take 50 mcg by mouth daily before breakfast.    . Multiple Vitamins-Minerals (MULTIVITAMIN PO) Take 1 tablet by mouth daily.    . nitroGLYCERIN (NITROSTAT) 0.4 MG SL tablet DISSOLVE 1 TABLET UNDER THE TONGUE EVERY 5 MINUTES AS NEEDED FOR CHEST PAIN. MAX 3 DOSES IN 15 MINUTES 25 tablet 11  . omeprazole (PRILOSEC OTC) 20 MG tablet Take 2 tablets (40 mg total) by mouth daily. (Patient taking differently: Take 20 mg by mouth daily. ) 30 tablet 0  .  ranolazine (RANEXA) 500 MG 12 hr tablet TAKE 1 TABLET(500 MG) BY MOUTH TWICE DAILY 60 tablet 7  . vitamin B-12 (CYANOCOBALAMIN) 1000 MCG tablet Take 1,000 mcg by mouth daily.      No current facility-administered medications for this visit.     Allergies:   Patient has no known allergies.   Social History:  The patient  reports that he has never smoked. He has never used smokeless tobacco. He reports current alcohol use of about 1.0 standard drinks of alcohol per week. He reports that he does not use drugs.   Family  History:  The patient's family history includes Heart attack in his mother.    ROS:  Please see the history of present illness.   Otherwise, review of systems is positive for none.   All other systems are reviewed and negative.    PHYSICAL EXAM: VS:  BP 126/60   Pulse 88   Ht 5\' 6"  (1.676 m)   Wt 185 lb (83.9 kg)   BMI 29.86 kg/m  , BMI Body mass index is 29.86 kg/m. GEN: Well nourished, well developed, in no acute distress  HEENT: normal  Neck: no JVD, carotid bruits, or masses Cardiac: RRR; no murmurs, rubs, or gallops,no edema  Respiratory:  clear to auscultation bilaterally, normal work of breathing GI: soft, nontender, nondistended, + BS MS: no deformity or atrophy  Skin: warm and dry, device pocket is well healed Neuro:  Strength and sensation are intact Psych: euthymic mood, full affect  EKG:  EKG is ordered today. Personal review of the ekg ordered shows AV paced  Device interrogation is reviewed today in detail.  See PaceArt for details.   Recent Labs: 08/19/2017: Magnesium 1.6 01/25/2018: BUN 26; Creatinine, Ser 1.33; Hemoglobin 9.7; Platelets 234; Potassium 4.7; Sodium 136    Lipid Panel     Component Value Date/Time   CHOL 172 02/08/2017 0204   TRIG 176 (H) 02/08/2017 0204   HDL 43 02/08/2017 0204   CHOLHDL 4.0 02/08/2017 0204   VLDL 35 02/08/2017 0204   LDLCALC 94 02/08/2017 0204     Wt Readings from Last 3 Encounters:  07/31/18 185 lb (83.9 kg)  06/05/18 178 lb 12.8 oz (81.1 kg)  01/24/18 192 lb 7.4 oz (87.3 kg)      Other studies Reviewed: Additional studies/ records that were reviewed today include: LHC 02/08/17  Review of the above records today demonstrates:  Left Main  Vessel was injected. Vessel is normal in caliber. Vessel is angiographically normal.  Left Anterior Descending  Prox LAD lesion 40% stenosed  The lesion is severely calcified.  Prox LAD to Mid LAD lesion 50% stenosed  The lesion is severely calcified.  Left Circumflex    Left Posterior Descending Artery  LPDA lesion 60% stenosed  LPDA lesion.  Right Coronary Artery  Mid RCA lesion 60% stenosed  Mid RCA lesion.   TTE 02/07/17 Ejection fraction 50 to 55% Mild mitral regurgitation Mild tricuspid regurgitation  ASSESSMENT AND PLAN:  1.  Complete heart block: Status post Medtronic dual-chamber pacemaker.  Functioning appropriately.  No changes at this time.  2.  Coronary artery disease: Currently feeling well without chest pain.  No changes.  3.  Hypertension: Currently well controlled.    Current medicines are reviewed at length with the patient today.   The patient does not have concerns regarding his medicines.  The following changes were made today:  none  Labs/ tests ordered today include:   Orders Placed This Encounter  Procedures  . EKG 12-Lead     Disposition:   FU with Sriyan Cutting 1 year  Signed, Tushar Enns Meredith Leeds, MD  07/31/2018 9:50 AM     [facility] [address] [place] [address] [phone] (office) [phone] (fax)

## 2018-08-02 DIAGNOSIS — M1711 Unilateral primary osteoarthritis, right knee: Secondary | ICD-10-CM | POA: Diagnosis not present

## 2018-08-02 DIAGNOSIS — M25561 Pain in right knee: Secondary | ICD-10-CM | POA: Diagnosis not present

## 2018-08-02 DIAGNOSIS — S7291XD Unspecified fracture of right femur, subsequent encounter for closed fracture with routine healing: Secondary | ICD-10-CM | POA: Diagnosis not present

## 2018-08-06 LAB — CUP PACEART INCLINIC DEVICE CHECK
Battery Remaining Longevity: 93 mo
Brady Statistic AP VP Percent: 22 %
Brady Statistic AS VS Percent: 0 %
Date Time Interrogation Session: 20200210144144
Implantable Lead Implant Date: 20150319
Implantable Lead Location: 753859
Implantable Lead Model: 5076
Implantable Pulse Generator Implant Date: 20150319
Lead Channel Impedance Value: 409 Ohm
Lead Channel Pacing Threshold Amplitude: 0.75 V
Lead Channel Pacing Threshold Amplitude: 1 V
Lead Channel Pacing Threshold Amplitude: 1.25 V
Lead Channel Pacing Threshold Pulse Width: 0.4 ms
Lead Channel Pacing Threshold Pulse Width: 0.4 ms
Lead Channel Pacing Threshold Pulse Width: 0.4 ms
Lead Channel Sensing Intrinsic Amplitude: 0.7 mV
Lead Channel Sensing Intrinsic Amplitude: 5.6 mV
Lead Channel Setting Pacing Amplitude: 2.5 V
MDC IDC LEAD IMPLANT DT: 20150319
MDC IDC LEAD LOCATION: 753860
MDC IDC MSMT BATTERY IMPEDANCE: 374 Ohm
MDC IDC MSMT BATTERY VOLTAGE: 2.78 V
MDC IDC MSMT LEADCHNL RA PACING THRESHOLD PULSEWIDTH: 0.4 ms
MDC IDC MSMT LEADCHNL RV IMPEDANCE VALUE: 545 Ohm
MDC IDC MSMT LEADCHNL RV PACING THRESHOLD AMPLITUDE: 1.25 V
MDC IDC SET LEADCHNL RA PACING AMPLITUDE: 2 V
MDC IDC SET LEADCHNL RV PACING PULSEWIDTH: 0.4 ms
MDC IDC SET LEADCHNL RV SENSING SENSITIVITY: 4 mV
MDC IDC STAT BRADY AP VS PERCENT: 0 %
MDC IDC STAT BRADY AS VP PERCENT: 78 %

## 2018-08-07 DIAGNOSIS — Z6829 Body mass index (BMI) 29.0-29.9, adult: Secondary | ICD-10-CM | POA: Diagnosis not present

## 2018-08-07 DIAGNOSIS — I69351 Hemiplegia and hemiparesis following cerebral infarction affecting right dominant side: Secondary | ICD-10-CM | POA: Diagnosis not present

## 2018-08-07 DIAGNOSIS — I10 Essential (primary) hypertension: Secondary | ICD-10-CM | POA: Diagnosis not present

## 2018-08-07 DIAGNOSIS — E782 Mixed hyperlipidemia: Secondary | ICD-10-CM | POA: Diagnosis not present

## 2018-08-07 DIAGNOSIS — S7221XD Displaced subtrochanteric fracture of right femur, subsequent encounter for closed fracture with routine healing: Secondary | ICD-10-CM | POA: Diagnosis not present

## 2018-08-07 DIAGNOSIS — E038 Other specified hypothyroidism: Secondary | ICD-10-CM | POA: Diagnosis not present

## 2018-08-07 DIAGNOSIS — I69314 Frontal lobe and executive function deficit following cerebral infarction: Secondary | ICD-10-CM | POA: Diagnosis not present

## 2018-08-28 ENCOUNTER — Other Ambulatory Visit: Payer: Self-pay | Admitting: Cardiology

## 2018-10-30 ENCOUNTER — Ambulatory Visit (INDEPENDENT_AMBULATORY_CARE_PROVIDER_SITE_OTHER): Payer: PPO | Admitting: *Deleted

## 2018-10-30 ENCOUNTER — Other Ambulatory Visit: Payer: Self-pay

## 2018-10-30 DIAGNOSIS — I442 Atrioventricular block, complete: Secondary | ICD-10-CM

## 2018-10-30 DIAGNOSIS — I480 Paroxysmal atrial fibrillation: Secondary | ICD-10-CM

## 2018-10-31 ENCOUNTER — Telehealth: Payer: Self-pay

## 2018-10-31 LAB — CUP PACEART REMOTE DEVICE CHECK
Battery Impedance: 449 Ohm
Battery Remaining Longevity: 83 mo
Battery Voltage: 2.78 V
Brady Statistic AP VP Percent: 17 %
Brady Statistic AP VS Percent: 0 %
Brady Statistic AS VP Percent: 83 %
Brady Statistic AS VS Percent: 0 %
Date Time Interrogation Session: 20200512113842
Implantable Lead Implant Date: 20150319
Implantable Lead Implant Date: 20150319
Implantable Lead Location: 753859
Implantable Lead Location: 753860
Implantable Lead Model: 4092
Implantable Lead Model: 5076
Implantable Pulse Generator Implant Date: 20150319
Lead Channel Impedance Value: 403 Ohm
Lead Channel Impedance Value: 536 Ohm
Lead Channel Pacing Threshold Amplitude: 0.875 V
Lead Channel Pacing Threshold Amplitude: 1.375 V
Lead Channel Pacing Threshold Pulse Width: 0.4 ms
Lead Channel Pacing Threshold Pulse Width: 0.4 ms
Lead Channel Setting Pacing Amplitude: 1.75 V
Lead Channel Setting Pacing Amplitude: 2.75 V
Lead Channel Setting Pacing Pulse Width: 0.4 ms
Lead Channel Setting Sensing Sensitivity: 2 mV

## 2018-10-31 NOTE — Telephone Encounter (Signed)
Spoke with patient to remind of missed remote transmission 

## 2018-11-06 ENCOUNTER — Other Ambulatory Visit: Payer: Self-pay | Admitting: Cardiology

## 2018-11-06 MED ORDER — NITROGLYCERIN 0.4 MG SL SUBL: SUBLINGUAL_TABLET | SUBLINGUAL | 3 refills | Status: DC

## 2018-11-06 NOTE — Telephone Encounter (Signed)
°*  STAT* If patient is at the pharmacy, call can be transferred to refill team.   1. Which medications need to be refilled? (please list name of each medication and dose if known) Nitroglycerin 0.4mg  SL  2. Which pharmacy/location (including street and city if local pharmacy) is medication to be sent to? [facility] in [place]  3. Do they need a 30 day or 90 day supply? [place]

## 2018-11-08 ENCOUNTER — Other Ambulatory Visit: Payer: Self-pay | Admitting: Cardiology

## 2018-11-14 NOTE — Progress Notes (Signed)
Remote pacemaker transmission.   

## 2018-11-15 ENCOUNTER — Other Ambulatory Visit: Payer: Self-pay

## 2018-11-15 ENCOUNTER — Other Ambulatory Visit: Payer: Self-pay | Admitting: Cardiology

## 2018-11-15 MED ORDER — RANOLAZINE ER 500 MG PO TB12: ORAL_TABLET | ORAL | 1 refills | Status: DC

## 2018-11-15 NOTE — Telephone Encounter (Signed)
30 day refill for ranexa sent to pharmacy with note to have patient call to schedule 6 mo f/u.

## 2018-12-07 DIAGNOSIS — E782 Mixed hyperlipidemia: Secondary | ICD-10-CM | POA: Diagnosis not present

## 2018-12-07 DIAGNOSIS — I25110 Atherosclerotic heart disease of native coronary artery with unstable angina pectoris: Secondary | ICD-10-CM | POA: Diagnosis not present

## 2018-12-07 DIAGNOSIS — E038 Other specified hypothyroidism: Secondary | ICD-10-CM | POA: Diagnosis not present

## 2018-12-07 DIAGNOSIS — Z6829 Body mass index (BMI) 29.0-29.9, adult: Secondary | ICD-10-CM | POA: Diagnosis not present

## 2018-12-07 DIAGNOSIS — I480 Paroxysmal atrial fibrillation: Secondary | ICD-10-CM | POA: Diagnosis not present

## 2018-12-07 DIAGNOSIS — I69314 Frontal lobe and executive function deficit following cerebral infarction: Secondary | ICD-10-CM | POA: Diagnosis not present

## 2018-12-07 DIAGNOSIS — Z7901 Long term (current) use of anticoagulants: Secondary | ICD-10-CM | POA: Diagnosis not present

## 2018-12-07 DIAGNOSIS — Z1159 Encounter for screening for other viral diseases: Secondary | ICD-10-CM | POA: Diagnosis not present

## 2018-12-07 DIAGNOSIS — J438 Other emphysema: Secondary | ICD-10-CM | POA: Diagnosis not present

## 2018-12-07 DIAGNOSIS — Z950 Presence of cardiac pacemaker: Secondary | ICD-10-CM | POA: Diagnosis not present

## 2018-12-07 DIAGNOSIS — I69351 Hemiplegia and hemiparesis following cerebral infarction affecting right dominant side: Secondary | ICD-10-CM | POA: Diagnosis not present

## 2018-12-07 DIAGNOSIS — I10 Essential (primary) hypertension: Secondary | ICD-10-CM | POA: Diagnosis not present

## 2019-01-16 ENCOUNTER — Other Ambulatory Visit: Payer: Self-pay | Admitting: Cardiology

## 2019-01-29 ENCOUNTER — Encounter: Payer: PPO | Admitting: *Deleted

## 2019-02-06 ENCOUNTER — Encounter: Payer: Self-pay | Admitting: Cardiology

## 2019-02-12 ENCOUNTER — Other Ambulatory Visit: Payer: Self-pay | Admitting: Cardiology

## 2019-02-16 ENCOUNTER — Ambulatory Visit (INDEPENDENT_AMBULATORY_CARE_PROVIDER_SITE_OTHER): Payer: PPO | Admitting: *Deleted

## 2019-02-16 DIAGNOSIS — I442 Atrioventricular block, complete: Secondary | ICD-10-CM

## 2019-02-17 LAB — CUP PACEART REMOTE DEVICE CHECK
Battery Impedance: 523 Ohm
Battery Remaining Longevity: 82 mo
Battery Voltage: 2.78 V
Brady Statistic AP VP Percent: 15 %
Brady Statistic AP VS Percent: 0 %
Brady Statistic AS VP Percent: 85 %
Brady Statistic AS VS Percent: 0 %
Date Time Interrogation Session: 20200828213454
Implantable Lead Implant Date: 20150319
Implantable Lead Implant Date: 20150319
Implantable Lead Location: 753859
Implantable Lead Location: 753860
Implantable Lead Model: 4092
Implantable Lead Model: 5076
Implantable Pulse Generator Implant Date: 20150319
Lead Channel Impedance Value: 397 Ohm
Lead Channel Impedance Value: 527 Ohm
Lead Channel Pacing Threshold Amplitude: 0.75 V
Lead Channel Pacing Threshold Amplitude: 1.25 V
Lead Channel Pacing Threshold Pulse Width: 0.4 ms
Lead Channel Pacing Threshold Pulse Width: 0.4 ms
Lead Channel Setting Pacing Amplitude: 1.625
Lead Channel Setting Pacing Amplitude: 2.5 V
Lead Channel Setting Pacing Pulse Width: 0.4 ms
Lead Channel Setting Sensing Sensitivity: 2 mV

## 2019-02-21 ENCOUNTER — Encounter: Payer: Self-pay | Admitting: Cardiology

## 2019-02-21 NOTE — Progress Notes (Signed)
Remote pacemaker transmission.   

## 2019-03-13 ENCOUNTER — Other Ambulatory Visit: Payer: Self-pay | Admitting: Cardiology

## 2019-03-26 ENCOUNTER — Other Ambulatory Visit: Payer: Self-pay | Admitting: Cardiology

## 2019-04-09 ENCOUNTER — Other Ambulatory Visit: Payer: Self-pay

## 2019-04-09 ENCOUNTER — Ambulatory Visit (INDEPENDENT_AMBULATORY_CARE_PROVIDER_SITE_OTHER): Payer: PPO | Admitting: Cardiology

## 2019-04-09 ENCOUNTER — Encounter (HOSPITAL_COMMUNITY): Payer: Self-pay | Admitting: Emergency Medicine

## 2019-04-09 ENCOUNTER — Inpatient Hospital Stay (HOSPITAL_COMMUNITY)
Admission: EM | Admit: 2019-04-09 | Discharge: 2019-04-12 | DRG: 811 | Disposition: A | Discharge status: Home / Self Care | Payer: PPO | Source: Ambulatory Visit | Attending: Internal Medicine | Admitting: Internal Medicine

## 2019-04-09 ENCOUNTER — Encounter: Payer: Self-pay | Admitting: Cardiology

## 2019-04-09 ENCOUNTER — Telehealth: Payer: Self-pay | Admitting: Emergency Medicine

## 2019-04-09 ENCOUNTER — Emergency Department (HOSPITAL_COMMUNITY): Payer: PPO

## 2019-04-09 VITALS — BP 130/60 | HR 70 | Ht 66.0 in | Wt 192.0 lb

## 2019-04-09 DIAGNOSIS — J90 Pleural effusion, not elsewhere classified: Secondary | ICD-10-CM

## 2019-04-09 DIAGNOSIS — Z8249 Family history of ischemic heart disease and other diseases of the circulatory system: Secondary | ICD-10-CM | POA: Diagnosis not present

## 2019-04-09 DIAGNOSIS — Z7901 Long term (current) use of anticoagulants: Secondary | ICD-10-CM | POA: Diagnosis not present

## 2019-04-09 DIAGNOSIS — Z6830 Body mass index (BMI) 30.0-30.9, adult: Secondary | ICD-10-CM | POA: Diagnosis not present

## 2019-04-09 DIAGNOSIS — I10 Essential (primary) hypertension: Secondary | ICD-10-CM | POA: Diagnosis not present

## 2019-04-09 DIAGNOSIS — J45909 Unspecified asthma, uncomplicated: Secondary | ICD-10-CM | POA: Diagnosis present

## 2019-04-09 DIAGNOSIS — I2510 Atherosclerotic heart disease of native coronary artery without angina pectoris: Secondary | ICD-10-CM | POA: Diagnosis present

## 2019-04-09 DIAGNOSIS — R072 Precordial pain: Secondary | ICD-10-CM

## 2019-04-09 DIAGNOSIS — M1990 Unspecified osteoarthritis, unspecified site: Secondary | ICD-10-CM | POA: Diagnosis present

## 2019-04-09 DIAGNOSIS — I25110 Atherosclerotic heart disease of native coronary artery with unstable angina pectoris: Secondary | ICD-10-CM | POA: Diagnosis present

## 2019-04-09 DIAGNOSIS — E782 Mixed hyperlipidemia: Secondary | ICD-10-CM | POA: Diagnosis not present

## 2019-04-09 DIAGNOSIS — D123 Benign neoplasm of transverse colon: Secondary | ICD-10-CM | POA: Diagnosis not present

## 2019-04-09 DIAGNOSIS — I5043 Acute on chronic combined systolic (congestive) and diastolic (congestive) heart failure: Secondary | ICD-10-CM

## 2019-04-09 DIAGNOSIS — K2970 Gastritis, unspecified, without bleeding: Secondary | ICD-10-CM

## 2019-04-09 DIAGNOSIS — I6521 Occlusion and stenosis of right carotid artery: Secondary | ICD-10-CM | POA: Diagnosis present

## 2019-04-09 DIAGNOSIS — N183 Chronic kidney disease, stage 3 unspecified: Secondary | ICD-10-CM | POA: Diagnosis present

## 2019-04-09 DIAGNOSIS — K635 Polyp of colon: Secondary | ICD-10-CM | POA: Diagnosis present

## 2019-04-09 DIAGNOSIS — I69351 Hemiplegia and hemiparesis following cerebral infarction affecting right dominant side: Secondary | ICD-10-CM | POA: Diagnosis not present

## 2019-04-09 DIAGNOSIS — I340 Nonrheumatic mitral (valve) insufficiency: Secondary | ICD-10-CM | POA: Diagnosis not present

## 2019-04-09 DIAGNOSIS — K2990 Gastroduodenitis, unspecified, without bleeding: Secondary | ICD-10-CM | POA: Diagnosis present

## 2019-04-09 DIAGNOSIS — E785 Hyperlipidemia, unspecified: Secondary | ICD-10-CM | POA: Diagnosis present

## 2019-04-09 DIAGNOSIS — Z950 Presence of cardiac pacemaker: Secondary | ICD-10-CM | POA: Diagnosis not present

## 2019-04-09 DIAGNOSIS — D6489 Other specified anemias: Secondary | ICD-10-CM | POA: Diagnosis not present

## 2019-04-09 DIAGNOSIS — I200 Unstable angina: Secondary | ICD-10-CM | POA: Diagnosis not present

## 2019-04-09 DIAGNOSIS — K317 Polyp of stomach and duodenum: Secondary | ICD-10-CM | POA: Diagnosis present

## 2019-04-09 DIAGNOSIS — I69314 Frontal lobe and executive function deficit following cerebral infarction: Secondary | ICD-10-CM | POA: Diagnosis not present

## 2019-04-09 DIAGNOSIS — I5023 Acute on chronic systolic (congestive) heart failure: Secondary | ICD-10-CM | POA: Insufficient documentation

## 2019-04-09 DIAGNOSIS — Z79890 Hormone replacement therapy (postmenopausal): Secondary | ICD-10-CM | POA: Diagnosis not present

## 2019-04-09 DIAGNOSIS — R079 Chest pain, unspecified: Secondary | ICD-10-CM | POA: Diagnosis not present

## 2019-04-09 DIAGNOSIS — I208 Other forms of angina pectoris: Secondary | ICD-10-CM | POA: Diagnosis present

## 2019-04-09 DIAGNOSIS — I209 Angina pectoris, unspecified: Secondary | ICD-10-CM | POA: Diagnosis not present

## 2019-04-09 DIAGNOSIS — I130 Hypertensive heart and chronic kidney disease with heart failure and stage 1 through stage 4 chronic kidney disease, or unspecified chronic kidney disease: Secondary | ICD-10-CM | POA: Diagnosis present

## 2019-04-09 DIAGNOSIS — I739 Peripheral vascular disease, unspecified: Secondary | ICD-10-CM

## 2019-04-09 DIAGNOSIS — D132 Benign neoplasm of duodenum: Secondary | ICD-10-CM | POA: Diagnosis present

## 2019-04-09 DIAGNOSIS — I361 Nonrheumatic tricuspid (valve) insufficiency: Secondary | ICD-10-CM | POA: Diagnosis not present

## 2019-04-09 DIAGNOSIS — E039 Hypothyroidism, unspecified: Secondary | ICD-10-CM | POA: Diagnosis present

## 2019-04-09 DIAGNOSIS — K219 Gastro-esophageal reflux disease without esophagitis: Secondary | ICD-10-CM | POA: Diagnosis present

## 2019-04-09 DIAGNOSIS — Z79899 Other long term (current) drug therapy: Secondary | ICD-10-CM

## 2019-04-09 DIAGNOSIS — K449 Diaphragmatic hernia without obstruction or gangrene: Secondary | ICD-10-CM | POA: Diagnosis present

## 2019-04-09 DIAGNOSIS — D649 Anemia, unspecified: Secondary | ICD-10-CM

## 2019-04-09 DIAGNOSIS — C61 Malignant neoplasm of prostate: Secondary | ICD-10-CM | POA: Diagnosis not present

## 2019-04-09 DIAGNOSIS — K2950 Unspecified chronic gastritis without bleeding: Secondary | ICD-10-CM | POA: Diagnosis present

## 2019-04-09 DIAGNOSIS — I5031 Acute diastolic (congestive) heart failure: Secondary | ICD-10-CM | POA: Diagnosis present

## 2019-04-09 DIAGNOSIS — D509 Iron deficiency anemia, unspecified: Principal | ICD-10-CM | POA: Diagnosis present

## 2019-04-09 DIAGNOSIS — D120 Benign neoplasm of cecum: Secondary | ICD-10-CM | POA: Diagnosis not present

## 2019-04-09 DIAGNOSIS — R062 Wheezing: Secondary | ICD-10-CM | POA: Diagnosis not present

## 2019-04-09 DIAGNOSIS — R0602 Shortness of breath: Secondary | ICD-10-CM | POA: Diagnosis not present

## 2019-04-09 DIAGNOSIS — K8020 Calculus of gallbladder without cholecystitis without obstruction: Secondary | ICD-10-CM | POA: Diagnosis not present

## 2019-04-09 DIAGNOSIS — Z1159 Encounter for screening for other viral diseases: Secondary | ICD-10-CM | POA: Diagnosis not present

## 2019-04-09 DIAGNOSIS — Z20828 Contact with and (suspected) exposure to other viral communicable diseases: Secondary | ICD-10-CM | POA: Diagnosis present

## 2019-04-09 DIAGNOSIS — I480 Paroxysmal atrial fibrillation: Secondary | ICD-10-CM

## 2019-04-09 DIAGNOSIS — Z8673 Personal history of transient ischemic attack (TIA), and cerebral infarction without residual deficits: Secondary | ICD-10-CM

## 2019-04-09 DIAGNOSIS — E038 Other specified hypothyroidism: Secondary | ICD-10-CM | POA: Diagnosis not present

## 2019-04-09 HISTORY — DX: Chronic kidney disease, stage 3 unspecified: N18.30

## 2019-04-09 HISTORY — DX: Acute on chronic combined systolic (congestive) and diastolic (congestive) heart failure: I50.43

## 2019-04-09 HISTORY — DX: Anemia, unspecified: D64.9

## 2019-04-09 HISTORY — DX: Paroxysmal atrial fibrillation: I48.0

## 2019-04-09 HISTORY — DX: Atherosclerotic heart disease of native coronary artery without angina pectoris: I25.10

## 2019-04-09 HISTORY — DX: Hypothyroidism, unspecified: E03.9

## 2019-04-09 LAB — COMPREHENSIVE METABOLIC PANEL
ALT: 12 U/L (ref 0–44)
AST: 18 U/L (ref 15–41)
Albumin: 3.4 g/dL — ABNORMAL LOW (ref 3.5–5.0)
Alkaline Phosphatase: 64 U/L (ref 38–126)
Anion gap: 9 (ref 5–15)
BUN: 14 mg/dL (ref 8–23)
CO2: 22 mmol/L (ref 22–32)
Calcium: 8.8 mg/dL — ABNORMAL LOW (ref 8.9–10.3)
Chloride: 106 mmol/L (ref 98–111)
Creatinine, Ser: 1.43 mg/dL — ABNORMAL HIGH (ref 0.61–1.24)
GFR calc Af Amer: 52 mL/min — ABNORMAL LOW (ref >60)
GFR calc non Af Amer: 45 mL/min — ABNORMAL LOW (ref >60)
Glucose, Bld: 97 mg/dL (ref 70–99)
Potassium: 4.4 mmol/L (ref 3.5–5.1)
Sodium: 137 mmol/L (ref 135–145)
Total Bilirubin: 0.4 mg/dL (ref 0.3–1.2)
Total Protein: 6.4 g/dL — ABNORMAL LOW (ref 6.5–8.1)

## 2019-04-09 LAB — RETICULOCYTES
Immature Retic Fract: 22.8 % — ABNORMAL HIGH (ref 2.3–15.9)
RBC.: 3.41 MIL/uL — ABNORMAL LOW (ref 4.22–5.81)
Retic Count, Absolute: 52.9 10*3/uL (ref 19.0–186.0)
Retic Ct Pct: 1.6 % (ref 0.4–3.1)

## 2019-04-09 LAB — CBC
HCT: 24.8 % — ABNORMAL LOW (ref 39.0–52.0)
HCT: 25.3 % — ABNORMAL LOW (ref 39.0–52.0)
Hemoglobin: 7.6 g/dL — ABNORMAL LOW (ref 13.0–17.0)
Hemoglobin: 8 g/dL — ABNORMAL LOW (ref 13.0–17.0)
MCH: 23.8 pg — ABNORMAL LOW (ref 26.0–34.0)
MCH: 23.9 pg — ABNORMAL LOW (ref 26.0–34.0)
MCHC: 30.6 g/dL (ref 30.0–36.0)
MCHC: 31.6 g/dL (ref 30.0–36.0)
MCV: 77.7 fL — ABNORMAL LOW (ref 80.0–100.0)
MCV: 75.5 fL — ABNORMAL LOW (ref 80.0–100.0)
Platelets: 253 10*3/uL (ref 150–400)
Platelets: 277 10*3/uL (ref 150–400)
RBC: 3.19 MIL/uL — ABNORMAL LOW (ref 4.22–5.81)
RBC: 3.35 MIL/uL — ABNORMAL LOW (ref 4.22–5.81)
RDW: 16.7 % — ABNORMAL HIGH (ref 11.5–15.5)
RDW: 16.6 % — ABNORMAL HIGH (ref 11.5–15.5)
WBC: 5 10*3/uL (ref 4.0–10.5)
WBC: 5.1 10*3/uL (ref 4.0–10.5)
nRBC: 0 % (ref 0.0–0.2)
nRBC: 0 % (ref 0.0–0.2)

## 2019-04-09 LAB — TROPONIN I (HIGH SENSITIVITY)
Troponin I (High Sensitivity): 7 ng/L (ref <18)
Troponin I (High Sensitivity): 9 ng/L (ref <18)

## 2019-04-09 LAB — IRON AND TIBC
Iron: 10 ug/dL — ABNORMAL LOW (ref 45–182)
Saturation Ratios: 2 % — ABNORMAL LOW (ref 17.9–39.5)
TIBC: 470 ug/dL — ABNORMAL HIGH (ref 250–450)
UIBC: 460 ug/dL

## 2019-04-09 LAB — BRAIN NATRIURETIC PEPTIDE: B Natriuretic Peptide: 286.1 pg/mL — ABNORMAL HIGH (ref 0.0–100.0)

## 2019-04-09 LAB — FERRITIN: Ferritin: 14 ng/mL — ABNORMAL LOW (ref 24–336)

## 2019-04-09 LAB — FOLATE: Folate: 12.8 ng/mL (ref >5.9)

## 2019-04-09 LAB — SARS CORONAVIRUS 2 (TAT 6-24 HRS): SARS Coronavirus 2: NEGATIVE

## 2019-04-09 LAB — VITAMIN B12: Vitamin B-12: 372 pg/mL (ref 180–914)

## 2019-04-09 LAB — TSH: TSH: 2.307 u[IU]/mL (ref 0.350–4.500)

## 2019-04-09 MED ORDER — VITAMIN B-12 1000 MCG PO TABS
1000.0000 ug | ORAL_TABLET | Freq: Every day | ORAL | Status: DC
  Administered 2019-04-09 – 2019-04-12 (×4): 1000 ug via ORAL
  Filled 2019-04-09 (×4): qty 1

## 2019-04-09 MED ORDER — IPRATROPIUM BROMIDE HFA 17 MCG/ACT IN AERS
2.0000 | INHALATION_SPRAY | Freq: Once | RESPIRATORY_TRACT | Status: DC
  Filled 2019-04-09 (×2): qty 12.9

## 2019-04-09 MED ORDER — HEPARIN (PORCINE) 25000 UT/250ML-% IV SOLN
900.0000 [IU]/h | INTRAVENOUS | Status: DC
  Administered 2019-04-09: 900 [IU]/h via INTRAVENOUS
  Filled 2019-04-09: qty 250

## 2019-04-09 MED ORDER — ALBUTEROL SULFATE HFA 108 (90 BASE) MCG/ACT IN AERS
2.0000 | INHALATION_SPRAY | RESPIRATORY_TRACT | Status: DC | PRN
  Administered 2019-04-09: 2 via RESPIRATORY_TRACT
  Filled 2019-04-09: qty 6.7

## 2019-04-09 MED ORDER — ALBUTEROL SULFATE (2.5 MG/3ML) 0.083% IN NEBU: 2.5000 mg | INHALATION_SOLUTION | RESPIRATORY_TRACT | Status: DC | PRN

## 2019-04-09 MED ORDER — BUDESONIDE 0.25 MG/2ML IN SUSP
0.2500 mg | Freq: Two times a day (BID) | RESPIRATORY_TRACT | Status: DC
  Administered 2019-04-10 – 2019-04-12 (×5): 0.25 mg via RESPIRATORY_TRACT
  Filled 2019-04-09 (×5): qty 2

## 2019-04-09 MED ORDER — SODIUM CHLORIDE 0.9 % IV SOLN
INTRAVENOUS | Status: DC
  Administered 2019-04-11 (×2): via INTRAVENOUS

## 2019-04-09 MED ORDER — FUROSEMIDE 10 MG/ML IJ SOLN
80.0000 mg | Freq: Once | INTRAMUSCULAR | Status: AC
  Administered 2019-04-09: 80 mg via INTRAVENOUS
  Filled 2019-04-09: qty 8

## 2019-04-09 MED ORDER — ACETAMINOPHEN 325 MG PO TABS
650.0000 mg | ORAL_TABLET | ORAL | Status: DC | PRN
  Administered 2019-04-10 (×2): 650 mg via ORAL
  Filled 2019-04-09 (×2): qty 2

## 2019-04-09 MED ORDER — PANTOPRAZOLE SODIUM 40 MG PO TBEC
40.0000 mg | DELAYED_RELEASE_TABLET | Freq: Every day | ORAL | Status: DC
  Administered 2019-04-10 – 2019-04-12 (×3): 40 mg via ORAL
  Filled 2019-04-09: qty 2
  Filled 2019-04-09: qty 1
  Filled 2019-04-09: qty 2

## 2019-04-09 MED ORDER — ISOSORBIDE MONONITRATE ER 60 MG PO TB24
60.0000 mg | ORAL_TABLET | Freq: Every day | ORAL | Status: DC
  Administered 2019-04-09 – 2019-04-11 (×3): 60 mg via ORAL
  Filled 2019-04-09 (×3): qty 1

## 2019-04-09 MED ORDER — AMLODIPINE BESYLATE 5 MG PO TABS
5.0000 mg | ORAL_TABLET | Freq: Every day | ORAL | Status: DC
  Administered 2019-04-09 – 2019-04-11 (×3): 5 mg via ORAL
  Filled 2019-04-09 (×3): qty 1

## 2019-04-09 MED ORDER — LEVOTHYROXINE SODIUM 50 MCG PO TABS
50.0000 ug | ORAL_TABLET | Freq: Every day | ORAL | Status: DC
  Administered 2019-04-10 – 2019-04-12 (×3): 50 ug via ORAL
  Filled 2019-04-09 (×3): qty 1

## 2019-04-09 MED ORDER — NITROGLYCERIN 0.4 MG SL SUBL: 0.4000 mg | SUBLINGUAL_TABLET | SUBLINGUAL | Status: DC | PRN

## 2019-04-09 MED ORDER — RANOLAZINE ER 500 MG PO TB12
500.0000 mg | ORAL_TABLET | Freq: Two times a day (BID) | ORAL | Status: DC
  Administered 2019-04-09 – 2019-04-12 (×6): 500 mg via ORAL
  Filled 2019-04-09 (×6): qty 1

## 2019-04-09 MED ORDER — ATORVASTATIN CALCIUM 40 MG PO TABS
40.0000 mg | ORAL_TABLET | Freq: Every day | ORAL | Status: DC
  Administered 2019-04-09 – 2019-04-11 (×3): 40 mg via ORAL
  Filled 2019-04-09 (×3): qty 1

## 2019-04-09 MED ORDER — FLUTICASONE PROPIONATE HFA 110 MCG/ACT IN AERO: 2.0000 | INHALATION_SPRAY | Freq: Every day | RESPIRATORY_TRACT | Status: DC

## 2019-04-09 MED ORDER — ONDANSETRON HCL 4 MG/2ML IJ SOLN: 4.0000 mg | Freq: Four times a day (QID) | INTRAMUSCULAR | Status: DC | PRN

## 2019-04-09 MED ORDER — OMEPRAZOLE MAGNESIUM 20 MG PO TBEC: 20.0000 mg | DELAYED_RELEASE_TABLET | Freq: Every morning | ORAL | Status: DC

## 2019-04-09 NOTE — Telephone Encounter (Signed)
Called patient to add him on today's schedule per Dr. Agustin Cree per Dr. Micheal Likens. No answer. Left message for him to return call.

## 2019-04-09 NOTE — Progress Notes (Signed)
ANTICOAGULATION CONSULT NOTE - Initial Consult  Pharmacy Consult for heparin Indication: chest pain/ACS and atrial fibrillation  No Known Allergies  Patient Measurements:   Heparin Dosing Weight: 82 Kg  Vital Signs: Temp: 97.7 F (36.5 C) (10/19 1428) Temp Source: Oral (10/19 1428) BP: 167/73 (10/19 1830) Pulse Rate: 63 (10/19 1845)  Labs: Recent Labs    04/09/19 1453 04/09/19 1728  HGB 7.6*  --   HCT 24.8*  --   PLT 253  --   CREATININE 1.43*  --   TROPONINIHS 7 9    Estimated Creatinine Clearance: 41.2 mL/min (A) (by C-G formula based on SCr of 1.43 mg/dL (H)).   Medical History: Past Medical History:  Diagnosis Date  . Arthritis   . Asthma   . Coronary artery disease   . GERD (gastroesophageal reflux disease)   . Hypertension   . Peripheral vascular disease (HCC)     Medications:  Scheduled:  . ipratropium  2 puff Inhalation Once   Infusions:  . sodium chloride    . heparin      Assessment: 76 y/ male with PMH of non-obstructive CAD, chronic combined CHF, paroxysmal atrial fibrillation on eliquis, CHB s/p PPM, PVD, carotid artery disease with occluded R ICA, HTN, HLD, CVA, hypothyroidism, asthma, CKD stage 3, and GERD, who presented with SOB/chest pressure. Pharmacy has been consulted to dose heparin. Patient was on Apixaban PTA with last dose at 10/19 @ 0800.  Heparin level will be falsely elevated due to recent apixaban exposure.  Pt's Hg is low at 7.6. Plts are wnls. Troponin is wnls 7 >> 9.  Goal of Therapy:  Heparin level 0.3-0.5 units/ml Aptt 66-85 Monitor platelets by anticoagulation protocol: Yes   Plan:  Start heparin infusion at 900 units/hr Check anti-Xa level and aPTT in 8 hours and daily while on heparin. Will dose with aPTT until heparin level and aPTT correlates Continue to monitor H&H and platelets  Will aim for lower heparin goal due to drop in hemoglobin and history of CVA. Monitor for signs/symptoms of bleeding  Sherren Kerns,  PharmD PGY1 Acute Care Pharmacy Resident 04/09/2019,7:07 PM

## 2019-04-09 NOTE — Progress Notes (Signed)
Cardiology Office Note:    Date:  04/09/2019   ID:  [name], DOB 12/19/1934, MRN [id number]  PCP:  Lillard Anes, MD  Cardiologist:  Jenne Campus, MD    Referring MD: Lillard Anes,*   Chief Complaint  Patient presents with  . Chest Pain  . Shortness of Breath  Of shortness of breath and chest pain  History of Present Illness:    [name] is a 84 y.o. male with coronary artery disease, status post cardiac catheterization done in the fall 2018 showing: 60% immediate RCA, 40% proximal LAD with 50% mid LAD.  Left PDA was 60% stenosed.  Also has history of peripheral vascular disease with completely occluded right internal carotic artery.  Pacemaker present, hypertension.  There was a phone call from his primary care physician who requested seem to be seen today because of progressive shortness of breath as well as progressive exertional shortness of breath and chest pain.  That being on for about 2 weeks however last week was absolutely horrible he could not walk across the room to the restroom without getting chest tightness and shortness of breath.  I see him in the office sitting in the examination table somewhat pale short of breath denies having any chest pain right now. No palpitations No nausea No swelling of lower extremities But described to have paroxysmal nocturnal dyspnea.   Past Medical History:  Diagnosis Date  . Arthritis   . Asthma   . Coronary artery disease   . GERD (gastroesophageal reflux disease)   . Hypertension   . Peripheral vascular disease [facility])     Past Surgical History:  Procedure Laterality Date  . CATARACT EXTRACTION    . HIP SURGERY    . INSERT / REPLACE / REMOVE PACEMAKER     Medtronic  . JOINT REPLACEMENT Right 10/20/1994   Hip  (Left Hip in 10/96)  . LEFT HEART CATH AND CORONARY ANGIOGRAPHY N/A 02/08/2017   Procedure: LEFT HEART CATH AND CORONARY ANGIOGRAPHY;  Surgeon: Martinique, Peter M, MD;  Location: [place] [facility];  Service: Cardiovascular;  Laterality: N/A;  . ORIF PERIPROSTHETIC FRACTURE Right 08/17/2017   Procedure: OPEN REDUCTION INTERNAL FIXATION (ORIF) PERIPROSTHETIC FRACTURE RIGHT FEMUR;  Surgeon: Paralee Cancel, MD;  [facility];  Service: Orthopedics;  Laterality: Right;  . ORIF PERIPROSTHETIC FRACTURE Right 01/23/2018   Procedure: Redo open reduction internal fixation right periprosthetic femur fracture;  Surgeon: Paralee Cancel, MD;  Location: [facility];  Service: Orthopedics;  Laterality: Right;  120 mins    Current Medications: Current Meds  Medication Sig  . albuterol (PROVENTIL HFA;VENTOLIN HFA) 108 (90 Base) MCG/ACT inhaler Inhale 2 puffs into the lungs 4 (four) times daily as needed for wheezing or shortness of breath.  Marland Kitchen amLODipine (NORVASC) 5 MG tablet TAKE 1 TABLET(5 MG) BY MOUTH DAILY  . apixaban (ELIQUIS) 5 MG TABS tablet Take 1 tablet (5 mg total) by mouth 2 (two) times daily. *PLEASE SCHEDULE APPOINTMENT FOR FURTHER REFILLS*  . atorvastatin (LIPITOR) 40 MG tablet Take 40 mg by mouth daily.  . Calcium Carbonate-Vitamin D (CALCIUM 500 + D PO) Take 1 tablet by mouth daily.  . chlorthalidone (HYGROTON) 25 MG tablet Take 1 tablet (25 mg total) by mouth daily.  . fluticasone (FLOVENT HFA) 110 MCG/ACT inhaler Inhale 2 puffs into the lungs 2 (two) times daily.   Marland Kitchen HYDROcodone-acetaminophen (NORCO/VICODIN) 5-325 MG tablet TK 1 T PO BID PRN  . isosorbide mononitrate (IMDUR) 60 MG 24  hr tablet Take 60 mg by mouth daily.   Marland Kitchen levothyroxine (SYNTHROID, LEVOTHROID) 50 MCG tablet Take 50 mcg by mouth daily before breakfast.  . Multiple Vitamins-Minerals (MULTIVITAMIN PO) Take 1 tablet by mouth daily.  . nitroGLYCERIN (NITROSTAT) 0.4 MG SL tablet DISSOLVE 1 TABLET UNDER THE TONGUE EVERY 5 MINUTES AS NEEDED FOR CHEST PAIN. MAX 3 DOSES IN 15 MINUTES  . omeprazole (PRILOSEC OTC) 20 MG tablet Take 2 tablets (40 mg total) by mouth daily. (Patient taking differently: Take 20 mg by mouth  daily. )  . ranolazine (RANEXA) 500 MG 12 hr tablet TAKE 1 TABLET(500 MG) BY MOUTH TWICE DAILY  . vitamin B-12 (CYANOCOBALAMIN) 1000 MCG tablet Take 1,000 mcg by mouth daily.      Allergies:   Patient has no known allergies.   Social History   Socioeconomic History  . Marital status: Married    Spouse name: Not on file  . Number of children: Not on file  . Years of education: Not on file  . Highest education level: Not on file  Occupational History  . Not on file  Social Needs  . Financial resource strain: Not on file  . Food insecurity    Worry: Not on file    Inability: Not on file  . Transportation needs    Medical: Not on file    Non-medical: Not on file  Tobacco Use  . Smoking status: Never Smoker  . Smokeless tobacco: Never Used  Substance and Sexual Activity  . Alcohol use: Yes    Alcohol/week: 1.0 standard drinks    Types: 1 Cans of beer per week    Comment: occ  . Drug use: No  . Sexual activity: Not on file  Lifestyle  . Physical activity    Days per week: Not on file    Minutes per session: Not on file  . Stress: Not on file  Relationships  . Social Herbalist on phone: Not on file    Gets together: Not on file    Attends religious service: Not on file    Active member of club or organization: Not on file    Attends meetings of clubs or organizations: Not on file    Relationship status: Not on file  Other Topics Concern  . Not on file  Social History Narrative  . Not on file     Family History: The patient's family history includes Heart attack in his mother. ROS:   Please see the history of present illness.    All 14 point review of systems negative except as described per history of present illness  EKGs/Labs/Other Studies Reviewed:    EKG today showed sinus rhythm with paced ventricle.  Recent Labs: No results found for requested labs within last 8760 hours.  Recent Lipid Panel    Component Value Date/Time   CHOL 172  02/08/2017 0204   TRIG 176 (H) 02/08/2017 0204   HDL 43 02/08/2017 0204   CHOLHDL 4.0 02/08/2017 0204   VLDL 35 02/08/2017 0204   LDLCALC 94 02/08/2017 0204    Physical Exam:    VS:  BP 130/60 (BP Location: Left Arm, Patient Position: Sitting, Cuff Size: Normal)   Pulse 70   Ht 5\' 6"  (1.676 m)   Wt 192 lb (87.1 kg)   SpO2 95%   BMI 30.99 kg/m     Wt Readings from Last 3 Encounters:  04/09/19 192 lb (87.1 kg)  07/31/18 185 lb (83.9 kg)  06/05/18 178 lb 12.8 oz (81.1 kg)     GEN:  Well nourished, well developed in no acute distress HEENT: Normal NECK: JVD stop even sitting upright; No carotid bruits LYMPHATICS: No lymphadenopathy CARDIAC: RRR, no murmurs, no rubs, no gallops RESPIRATORY:  Clear to auscultation without rales, wheezing or rhonchi  ABDOMEN: Soft, non-tender, non-distended MUSCULOSKELETAL:  No edema; No deformity  SKIN: Warm and dry LOWER EXTREMITIES: no swelling NEUROLOGIC:  Alert and oriented x 3 PSYCHIATRIC:  Normal affect   ASSESSMENT:    1. Paroxysmal atrial fibrillation (HCC)   2. Coronary artery disease involving native coronary artery of native heart without angina pectoris   3. Peripheral vascular disease ([place])   4. Precordial pain   5. Dyslipidemia   6. Acute on chronic combined systolic and diastolic CHF (congestive heart failure) ([place])   7. Unstable angina (HCC)    PLAN:    In order of problems listed above:  1. Coronary artery disease status post cardiac catheterization done in 2019 showing nonobstructive lesions covered now got typical exertional tightness with her chest.  Probably will require evaluation with cardiac catheterization.  Also clearly decompensated with congestive heart failure.  I think the best option for him will be to be admitted to the hospital managed for congestive heart failure/coronary artery disease.  He will be transferred to hospital via ambulance. 2. Pacemaker presence of Medtronic device last interrogation in  February 16, 2019 normal function 3. Dyslipidemia on statin Lipitor 40 high intensity will continue. 4. Decompensated congestive heart failure plan will be to admit to hospital and treat appropriately 5. Unstable angina.  Most likely will require repeated cardiac catheterization   Medication Adjustments/Labs and Tests Ordered: Current medicines are reviewed at length with the patient today.  Concerns regarding medicines are outlined above.  No orders of the defined types were placed in this encounter.  Medication changes: No orders of the defined types were placed in this encounter.   Signed, Park Liter, MD, [facility] 04/09/2019 1:05 PM    [place]

## 2019-04-09 NOTE — ED Notes (Signed)
Got patient undress on the monitor with call bell in reach

## 2019-04-09 NOTE — H&P (Signed)
Cardiology Admission History and Physical:   Patient ID: [name] MRN: [id number]; DOB: 11-Jun-1935   Admission date: 04/09/2019  Primary Care Provider: Lillard Anes, MD Primary Cardiologist: Jenne Campus, MD  Primary Electrophysiologist:  Will Meredith Leeds, MD   Chief Complaint:  SOB and chest pain  Patient Profile:   [name] is a 84 y.o. male with PMH of non-obstructive CAD, chronic combined CHF, paroxysmal atrial fibrillation on eliquis, CHB s/p PPM, PVD, carotid artery disease with occluded R ICA, HTN, HLD, CVA, hypothyroidism, asthma, CKD stage 3, and GERD, who presented with SOB.   History of Present Illness:   Pt is an 84 yo with moderate CAD by cath in 2018(40% prox LAD; 50% prox/mid LAD; 60% L PDA; 60% mRCA) The pt is also s/p PPM (followed by Purnell Shoemaker)   Last pacer check in Aug 2020.  Echo in 2018 LVEF 50 to 55% mild MR/TR. The pt says over the past 2 wks he has noticed increased SOB with exertion and chest pressure with exertion.   Last week if he was bad   If he got up and walked several steps he was short of breath and had to stop and sit down    He was light headed at times but denes syncope.   Says he has no chest tightness if he is not doing anything.    Does not some SOB mild at rest.   Appetite fair   No F/C   No cough   NO bleedking or change in bowel habits.   The pt was seen by PCP and then sent to cardiology Agustin Cree)   WIth complains he was sent via ambulance to [facility]  Currently the pt s relatively comfortable in bed.  Deneis CP   Breathing is fair   Heart Pathway Score:     Past Medical History:  Diagnosis Date  . Arthritis   . Asthma   . Coronary artery disease   . GERD (gastroesophageal reflux disease)   . Hypertension   . Peripheral vascular disease [facility])     Past Surgical History:  Procedure Laterality Date  . CATARACT EXTRACTION    . HIP SURGERY    . INSERT / REPLACE / REMOVE PACEMAKER     [employer]  . JOINT  REPLACEMENT Right 10/20/1994   Hip  (Left Hip in 10/96)  . LEFT HEART CATH AND CORONARY ANGIOGRAPHY N/A 02/08/2017   Procedure: LEFT HEART CATH AND CORONARY ANGIOGRAPHY;  Surgeon: Martinique, Peter M, MD;  Location: [place] [facility];  Service: Cardiovascular;  Laterality: N/A;  . ORIF PERIPROSTHETIC FRACTURE Right 08/17/2017   Procedure: OPEN REDUCTION INTERNAL FIXATION (ORIF) PERIPROSTHETIC FRACTURE RIGHT FEMUR;  Surgeon: Paralee Cancel, MD;  Location: [facility];  Service: Orthopedics;  Laterality: Right;  . ORIF PERIPROSTHETIC FRACTURE Right 01/23/2018   Procedure: Redo open reduction internal fixation right periprosthetic femur fracture;  Surgeon: Paralee Cancel, MD;  Location: [facility];  Service: Orthopedics;  Laterality: Right;  120 mins     Medications Prior to Admission: Prior to Admission medications   Medication Sig Start Date End Date Taking? Authorizing Provider  albuterol (PROVENTIL HFA;VENTOLIN HFA) 108 (90 Base) MCG/ACT inhaler Inhale 2 puffs into the lungs 4 (four) times daily as needed for wheezing or shortness of breath.    [provider]  amLODipine (NORVASC) 5 MG tablet TAKE 1 TABLET(5 MG) BY MOUTH DAILY 03/27/19   Park Liter, MD  apixaban (ELIQUIS) 5 MG TABS tablet Take  1 tablet (5 mg total) by mouth 2 (two) times daily. *PLEASE SCHEDULE APPOINTMENT FOR FURTHER REFILLS* 03/13/19   Park Liter, MD  atorvastatin (LIPITOR) 40 MG tablet Take 40 mg by mouth daily.    [provider]  Calcium Carbonate-Vitamin D (CALCIUM 500 + D PO) Take 1 tablet by mouth daily.    [provider]  chlorthalidone (HYGROTON) 25 MG tablet Take 1 tablet (25 mg total) by mouth daily. 02/09/17   Cheryln Manly, NP  fluticasone (FLOVENT HFA) 110 MCG/ACT inhaler Inhale 2 puffs into the lungs 2 (two) times daily.     [provider]  HYDROcodone-acetaminophen (NORCO/VICODIN) 5-325 MG tablet Take 1 tablet by mouth 2 (two) times daily as needed (pain).  03/26/19    [provider]  isosorbide mononitrate (IMDUR) 60 MG 24 hr tablet Take 60 mg by mouth daily.     [provider]  levothyroxine (SYNTHROID, LEVOTHROID) 50 MCG tablet Take 50 mcg by mouth daily before breakfast.    [provider]  Multiple Vitamins-Minerals (MULTIVITAMIN PO) Take 1 tablet by mouth daily.    [provider]  nitroGLYCERIN (NITROSTAT) 0.4 MG SL tablet DISSOLVE 1 TABLET UNDER THE TONGUE EVERY 5 MINUTES AS NEEDED FOR CHEST PAIN. MAX 3 DOSES IN 15 MINUTES 11/06/18   Park Liter, MD  omeprazole (PRILOSEC OTC) 20 MG tablet Take 2 tablets (40 mg total) by mouth daily. Patient taking differently: Take 20 mg by mouth daily.  08/20/17   Hosie Poisson, MD  ranolazine (RANEXA) 500 MG 12 hr tablet TAKE 1 TABLET(500 MG) BY MOUTH TWICE DAILY 01/16/19   Park Liter, MD  vitamin B-12 (CYANOCOBALAMIN) 1000 MCG tablet Take 1,000 mcg by mouth daily.     [provider]     Allergies:   No Known Allergies  Social History:   Social History   Socioeconomic History  . Marital status: Married    Spouse name: Not on file  . Number of children: Not on file  . Years of education: Not on file  . Highest education level: Not on file  Occupational History  . Not on file  Social Needs  . Financial resource strain: Not on file  . Food insecurity    Worry: Not on file    Inability: Not on file  . Transportation needs    Medical: Not on file    Non-medical: Not on file  Tobacco Use  . Smoking status: Never Smoker  . Smokeless tobacco: Never Used  Substance and Sexual Activity  . Alcohol use: Yes    Alcohol/week: 1.0 standard drinks    Types: 1 Cans of beer per week    Comment: occ  . Drug use: No  . Sexual activity: Not on file  Lifestyle  . Physical activity    Days per week: Not on file    Minutes per session: Not on file  . Stress: Not on file  Relationships  . Social Herbalist on phone: Not on file    Gets  together: Not on file    Attends religious service: Not on file    Active member of club or organization: Not on file    Attends meetings of clubs or organizations: Not on file    Relationship status: Not on file  . Intimate partner violence    Fear of current or ex partner: Not on file    Emotionally abused: Not on file    Physically  abused: Not on file    Forced sexual activity: Not on file  Other Topics Concern  . Not on file  Social History Narrative  . Not on file    Family History:   The patient's family history includes Heart attack in his mother.    ROS:  Please see the history of present illness.  All other ROS reviewed and negative.     Physical Exam/Data:   Vitals:   04/09/19 1428  BP: (!) 161/57  Pulse: 72  Resp: 18  Temp: 97.7 F (36.5 C)  TempSrc: Oral  SpO2: 100%   No intake or output data in the 24 hours ending 04/09/19 1702 Last 3 Weights 04/09/2019 07/31/2018 06/05/2018  Weight (lbs) 192 lb 185 lb 178 lb 12.8 oz  Weight (kg) 87.091 kg 83.915 kg 81.103 kg     There is no height or weight on file to calculate BMI.  General:  Overweight 84 yo in NAD   HEENT: normal Lymph: no adenopathy Neck: Neck full  No obvious JVD   Bilateral bruits   Endocrine:  No thryomegaly  Cardiac:  normal S1, S2; RRR;  Gr II/VI systolic murmur LLSB    Lungs:  Decresaed BS with mild wheezing at R base   Abd: soft, nontender, no hepatomegaly  Ext: Trace LE edema Musculoskeletal:  No deformities, BUE and BLE strength normal and equal Skin: warm and dry  Neuro:  CNs 2-12 intact, no focal abnormalities noted Psych:  Normal affect    EKG:  V-paced, HR 80.   Relevant CV Studies: Left heart catheterization 01/2017:  Mid RCA lesion, 60 %stenosed.  Prox LAD lesion, 40 %stenosed.  Prox LAD to Mid LAD lesion, 50 %stenosed.  LPDA lesion, 60 %stenosed.    Laboratory Data:  High Sensitivity Troponin:   Recent Labs  Lab 04/09/19 1453  TROPONINIHS 7      Chemistry  Recent Labs  Lab 04/09/19 1453  NA 137  K 4.4  CL 106  CO2 22  GLUCOSE 97  BUN 14  CREATININE 1.43*  CALCIUM 8.8*  GFRNONAA 45*  GFRAA 52*  ANIONGAP 9    Recent Labs  Lab 04/09/19 1453  PROT 6.4*  ALBUMIN 3.4*  AST 18  ALT 12  ALKPHOS 64  BILITOT 0.4   Hematology Recent Labs  Lab 04/09/19 1453  WBC 5.0  RBC 3.19*  HGB 7.6*  HCT 24.8*  MCV 77.7*  MCH 23.8*  MCHC 30.6  RDW 16.7*  PLT 253   BNP Recent Labs  Lab 04/09/19 1453  BNP 286.1*    DDimer No results for input(s): DDIMER in the last 168 hours.   Radiology/Studies:  Dg Chest Port 1 View  Result Date: 04/09/2019 CLINICAL DATA:  Shortness of breath. EXAM: PORTABLE CHEST 1 VIEW COMPARISON:  August 15, 2017 FINDINGS: Bilateral layering effusions with underlying opacities, possibly atelectasis. Stable cardiomegaly. Stable pacemaker. The hila and mediastinum are unchanged. No pneumothorax. IMPRESSION: New bilateral small layering effusions with underlying opacities, likely atelectasis. Electronically Signed   By: Dorise Bullion III M.D   On: 04/09/2019 14:39    Assessment and Plan:   1  Chest pressure / SOB     Patient's hx worrisome for unstable angina.    He has evid of mild volume increase.   EKG is paced  Labs:  Troponin is 7   Hgb is  7.6. (microcytic, hypochromic). This may be exacerbating above but still concerning for flow limiting CAD   But, he has  been this low before without angina.(when had surgery in past )    REcomm:   Admit to telemetry   R/O for MI  Diurese x 1 with IV lasix (80 mg) Echo to eval LVEF and wall motion. REpeat Hgb in tonight and in am     I would hold on transfusion for now.   Keep NPO for possible cath tomorrow    2  Anemia   Hold Eliquis for now   On this for PAF     Hemoccult stool    Repeat CBC after lasix     3  PAF    CHADSxVASc is 7 (CAD, HTN, CHF, Age, CVA).     4  Hx CHB   Pt is s/p PPM  Followed by Elliot Cousin  5  . Acute on chronic combined CHF: noted to  have systolic dysfunction in the past, though I do not see evidence of this. Last echo in 2018 with EF 50-55%, G1DD, mild MR/TR. CXR with small  - Will follow-up echocardiogram to evaluate LV function and wall motion   6. HTN: BP elevated on presentation to ED.  - Continue home amlodipine and imdur  7. HLD: Last lipid panel with LDL 94 in 2018 - Check FLP in am   - Continue atorvastatin with low threshold to uptitrate to 80mg  daily  8. Carotid artery disease: reported to have total occlusion of R ICA on carotid duplex from 2016. Not on ASA given  Anticoagulation for afib    - Continue atorvastatin  9. Hypothyroidism:  - Continue levothyroxine  Check TSH  10. GERD: on omeprazole at home - Pantoprazole as  inpatient    For questions or updates, please contact [place] Please consult [email or web address] for contact info under        Signed, Dorris Carnes, MD  04/09/2019 5:02 PM

## 2019-04-09 NOTE — ED Provider Notes (Addendum)
[facility] DEPARTMENT Provider Note   CSN: [id number] Arrival date & time: 04/09/19  1407     History   Chief Complaint Chief Complaint  Patient presents with  . Chest Pain    HPI ONTARIO WIEBER is a 84 y.o. male.     Patient with hx cad, asthma, htn, c/o increased sob, dyspnea with minimal exertion, chest tightness/discomfort w minimal exertion. Symptoms acute onset in past week, moderate, episodic, recurrent. No diaphoresis. No pleuritic pain. No nv. +orthopnea. Non smoker. No leg pain or asymmetric swelling. Compliant w home meds. States saw cardiologist today for same, as was sent to ED for admission, and further evaluation, possible cath.   The history is provided by the patient and the EMS personnel.    Past Medical History:  Diagnosis Date  . Arthritis   . Asthma   . Coronary artery disease   . GERD (gastroesophageal reflux disease)   . Hypertension   . Peripheral vascular disease [facility])     Patient Active Problem List   Diagnosis Date Noted  . Coronary artery disease nonobstructive based on cardiac catheterization from 2018 04/09/2019  . Acute on chronic combined systolic and diastolic CHF (congestive heart failure) ([place]) 04/09/2019  . Obesity 01/25/2018  . Femur fracture, right ([place]) 01/23/2018  . Fracture of femur ([place]) 01/23/2018  . Pain in right knee 10/12/2017  . Hip fracture requiring operative repair, right, closed, initial encounter ([place]) 08/15/2017  . Essential hypertension 08/15/2017  . Hip fracture ([place]) 08/15/2017  . Closed fracture of hip ([place]) 08/15/2017  . Fracture of hip ([place]) 08/15/2017  . Closed fracture of right femur ([place])   . Late effect of cerebrovascular accident (CVA) 05/25/2017  . Late effects of cerebrovascular disease 05/25/2017  . Sequela of cerebrovascular accident 05/25/2017  . Unstable angina ([place]) 02/08/2017  . Preinfarction syndrome ([place]) 02/08/2017  . Dyslipidemia 12/29/2016  . Right-sided  carotid artery disease ([place]) 12/29/2016  . Disorder of carotid artery ([place]) 12/29/2016  . Peripheral vascular disease ([place]) 04/23/2015  . Precordial pain 04/23/2015  . Complete heart block ([place]) 04/01/2015  . Hypothyroidism 04/01/2015  . Pacemaker reprogramming/check 04/01/2015  . Paroxysmal atrial fibrillation ([place]) 04/01/2015  . Complete atrioventricular block ([place]) 04/01/2015    Past Surgical History:  Procedure Laterality Date  . CATARACT EXTRACTION    . HIP SURGERY    . INSERT / REPLACE / REMOVE PACEMAKER     [employer]  . JOINT REPLACEMENT Right 10/20/1994   Hip  (Left Hip in 10/96)  . LEFT HEART CATH AND CORONARY ANGIOGRAPHY N/A 02/08/2017   Procedure: LEFT HEART CATH AND CORONARY ANGIOGRAPHY;  Surgeon: Martinique, Peter M, MD;  Location: [facility];  Service: Cardiovascular;  Laterality: N/A;  . ORIF PERIPROSTHETIC FRACTURE Right 08/17/2017   Procedure: OPEN REDUCTION INTERNAL FIXATION (ORIF) PERIPROSTHETIC FRACTURE RIGHT FEMUR;  Surgeon: Paralee Cancel, MD;  Location: [facility];  Service: Orthopedics;  Laterality: Right;  . ORIF PERIPROSTHETIC FRACTURE Right 01/23/2018   Procedure: Redo open reduction internal fixation right periprosthetic femur fracture;  Surgeon: Paralee Cancel, MD;  Location: [facility];  Service: Orthopedics;  Laterality: Right;  120 mins        Home Medications    Prior to Admission medications   Medication Sig Start Date End Date Taking? Authorizing Provider  albuterol (PROVENTIL HFA;VENTOLIN HFA) 108 (90 Base) MCG/ACT inhaler Inhale 2 puffs into the lungs 4 (four) times daily as needed for wheezing or shortness of breath.  [provider]  amLODipine (NORVASC) 5 MG tablet TAKE 1 TABLET(5 MG) BY MOUTH DAILY 03/27/19   Park Liter, MD  apixaban (ELIQUIS) 5 MG TABS tablet Take 1 tablet (5 mg total) by mouth 2 (two) times daily. *PLEASE SCHEDULE APPOINTMENT FOR FURTHER REFILLS* 03/13/19   Park Liter, MD  atorvastatin (LIPITOR) 40 MG  tablet Take 40 mg by mouth daily.    [provider]  Calcium Carbonate-Vitamin D (CALCIUM 500 + D PO) Take 1 tablet by mouth daily.    [provider]  chlorthalidone (HYGROTON) 25 MG tablet Take 1 tablet (25 mg total) by mouth daily. 02/09/17   Cheryln Manly, NP  fluticasone (FLOVENT HFA) 110 MCG/ACT inhaler Inhale 2 puffs into the lungs 2 (two) times daily.     [provider]  HYDROcodone-acetaminophen (NORCO/VICODIN) 5-325 MG tablet Take 1 tablet by mouth 2 (two) times daily as needed (pain).  03/26/19   [provider]  isosorbide mononitrate (IMDUR) 60 MG 24 hr tablet Take 60 mg by mouth daily.     [provider]  levothyroxine (SYNTHROID, LEVOTHROID) 50 MCG tablet Take 50 mcg by mouth daily before breakfast.    [provider]  Multiple Vitamins-Minerals (MULTIVITAMIN PO) Take 1 tablet by mouth daily.    [provider]  nitroGLYCERIN (NITROSTAT) 0.4 MG SL tablet DISSOLVE 1 TABLET UNDER THE TONGUE EVERY 5 MINUTES AS NEEDED FOR CHEST PAIN. MAX 3 DOSES IN 15 MINUTES 11/06/18   Park Liter, MD  omeprazole (PRILOSEC OTC) 20 MG tablet Take 2 tablets (40 mg total) by mouth daily. Patient taking differently: Take 20 mg by mouth daily.  08/20/17   Hosie Poisson, MD  ranolazine (RANEXA) 500 MG 12 hr tablet TAKE 1 TABLET(500 MG) BY MOUTH TWICE DAILY 01/16/19   Park Liter, MD  vitamin B-12 (CYANOCOBALAMIN) 1000 MCG tablet Take 1,000 mcg by mouth daily.     [provider]    Family History Family History  Problem Relation Age of Onset  . Heart attack Mother     Social History Social History   Tobacco Use  . Smoking status: Never Smoker  . Smokeless tobacco: Never Used  Substance Use Topics  . Alcohol use: Yes    Alcohol/week: 1.0 standard drinks    Types: 1 Cans of beer per week    Comment: occ  . Drug use: No     Allergies   Patient has no known allergies.   Review of Systems Review of  Systems  Constitutional: Negative for fever.  HENT: Negative for sore throat.   Eyes: Negative for redness.  Respiratory: Positive for shortness of breath.   Cardiovascular: Positive for chest pain. Negative for palpitations and leg swelling.  Gastrointestinal: Negative for abdominal pain and vomiting.  Endocrine: Negative for polyuria.  Genitourinary: Negative for flank pain.  Musculoskeletal: Negative for back pain and neck pain.  Skin: Negative for rash.  Neurological: Negative for headaches.  Hematological: Does not bruise/bleed easily.  Psychiatric/Behavioral: Negative for confusion.     Physical Exam Updated Vital Signs BP (!) 161/57 (BP Location: Left Arm)   Pulse 72   Temp 97.7 F (36.5 C) (Oral)   Resp 18   SpO2 100%   Physical Exam Vitals signs and nursing note reviewed.  Constitutional:      Appearance: Normal appearance. He is well-developed.  HENT:     Head: Atraumatic.     Nose: Nose normal.     Mouth/Throat:  Mouth: Mucous membranes are moist.     Pharynx: Oropharynx is clear.  Eyes:     General: No scleral icterus.    Conjunctiva/sclera: Conjunctivae normal.  Neck:     Musculoskeletal: Normal range of motion and neck supple. No neck rigidity.     Trachea: No tracheal deviation.  Cardiovascular:     Rate and Rhythm: Normal rate and regular rhythm.     Pulses: Normal pulses.     Heart sounds: Normal heart sounds. No murmur. No friction rub. No gallop.   Pulmonary:     Effort: Pulmonary effort is normal. No accessory muscle usage or respiratory distress.     Breath sounds: Wheezing present.     Comments: Rales base.  Abdominal:     General: Bowel sounds are normal. There is no distension.     Palpations: Abdomen is soft.     Tenderness: There is no abdominal tenderness. There is no guarding.  Genitourinary:    Comments: No cva tenderness. Musculoskeletal:        General: No swelling or tenderness.  Skin:    General: Skin is warm and dry.      Findings: No rash.  Neurological:     Mental Status: He is alert.     Comments: Alert, speech clear.   Psychiatric:        Mood and Affect: Mood normal.      ED Treatments / Results  Labs (all labs ordered are listed, but only abnormal results are displayed) Results for orders placed or performed during the hospital encounter of 04/09/19  SARS CORONAVIRUS 2 (TAT 6-24 HRS) Nasopharyngeal Nasopharyngeal Swab   Specimen: Nasopharyngeal Swab  Result Value Ref Range   SARS Coronavirus 2 NEGATIVE NEGATIVE  CBC  Result Value Ref Range   WBC 5.0 4.0 - 10.5 K/uL   RBC 3.19 (L) 4.22 - 5.81 MIL/uL   Hemoglobin 7.6 (L) 13.0 - 17.0 g/dL   HCT 24.8 (L) 39.0 - 52.0 %   MCV 77.7 (L) 80.0 - 100.0 fL   MCH 23.8 (L) 26.0 - 34.0 pg   MCHC 30.6 30.0 - 36.0 g/dL   RDW 16.7 (H) 11.5 - 15.5 %   Platelets 253 150 - 400 K/uL   nRBC 0.0 0.0 - 0.2 %  Comprehensive metabolic panel  Result Value Ref Range   Sodium 137 135 - 145 mmol/L   Potassium 4.4 3.5 - 5.1 mmol/L   Chloride 106 98 - 111 mmol/L   CO2 22 22 - 32 mmol/L   Glucose, Bld 97 70 - 99 mg/dL   BUN 14 8 - 23 mg/dL   Creatinine, Ser 1.43 (H) 0.61 - 1.24 mg/dL   Calcium 8.8 (L) 8.9 - 10.3 mg/dL   Total Protein 6.4 (L) 6.5 - 8.1 g/dL   Albumin 3.4 (L) 3.5 - 5.0 g/dL   AST 18 15 - 41 U/L   ALT 12 0 - 44 U/L   Alkaline Phosphatase 64 38 - 126 U/L   Total Bilirubin 0.4 0.3 - 1.2 mg/dL   GFR calc non Af Amer 45 (L) >60 mL/min   GFR calc Af Amer 52 (L) >60 mL/min   Anion gap 9 5 - 15  Brain natriuretic peptide  Result Value Ref Range   B Natriuretic Peptide 286.1 (H) 0.0 - 100.0 pg/mL  Vitamin B12  Result Value Ref Range   Vitamin B-12 372 180 - 914 pg/mL  Folate  Result Value Ref Range   Folate 12.8 >5.9 ng/mL  Iron and TIBC  Result Value Ref Range   Iron 10 (L) 45 - 182 ug/dL   TIBC 470 (H) 250 - 450 ug/dL   Saturation Ratios 2 (L) 17.9 - 39.5 %   UIBC 460 ug/dL  Ferritin  Result Value Ref Range   Ferritin 14 (L) 24 - 336  ng/mL  Reticulocytes  Result Value Ref Range   Retic Ct Pct 1.6 0.4 - 3.1 %   RBC. 3.41 (L) 4.22 - 5.81 MIL/uL   Retic Count, Absolute 52.9 19.0 - 186.0 K/uL   Immature Retic Fract 22.8 (H) 2.3 - 15.9 %  TSH  Result Value Ref Range   TSH 2.307 0.350 - 4.500 uIU/mL  Heparin level (unfractionated)  Result Value Ref Range   Heparin Unfractionated >2.20 (H) 0.30 - 0.70 IU/mL  APTT  Result Value Ref Range   aPTT 65 (H) 24 - 36 seconds  CBC  Result Value Ref Range   WBC 4.8 4.0 - 10.5 K/uL   RBC 3.14 (L) 4.22 - 5.81 MIL/uL   Hemoglobin 7.4 (L) 13.0 - 17.0 g/dL   HCT 23.7 (L) 39.0 - 52.0 %   MCV 75.5 (L) 80.0 - 100.0 fL   MCH 23.6 (L) 26.0 - 34.0 pg   MCHC 31.2 30.0 - 36.0 g/dL   RDW 16.5 (H) 11.5 - 15.5 %   Platelets 264 150 - 400 K/uL   nRBC 0.0 0.0 - 0.2 %  Lipid panel  Result Value Ref Range   Cholesterol 119 0 - 200 mg/dL   Triglycerides 56 <150 mg/dL   HDL 54 >40 mg/dL   Total CHOL/HDL Ratio 2.2 RATIO   VLDL 11 0 - 40 mg/dL   LDL Cholesterol 54 0 - 99 mg/dL  Basic metabolic panel  Result Value Ref Range   Sodium 139 135 - 145 mmol/L   Potassium 4.0 3.5 - 5.1 mmol/L   Chloride 104 98 - 111 mmol/L   CO2 25 22 - 32 mmol/L   Glucose, Bld 87 70 - 99 mg/dL   BUN 13 8 - 23 mg/dL   Creatinine, Ser 1.47 (H) 0.61 - 1.24 mg/dL   Calcium 9.2 8.9 - 10.3 mg/dL   GFR calc non Af Amer 44 (L) >60 mL/min   GFR calc Af Amer 51 (L) >60 mL/min   Anion gap 10 5 - 15  CBC  Result Value Ref Range   WBC 5.1 4.0 - 10.5 K/uL   RBC 3.35 (L) 4.22 - 5.81 MIL/uL   Hemoglobin 8.0 (L) 13.0 - 17.0 g/dL   HCT 25.3 (L) 39.0 - 52.0 %   MCV 75.5 (L) 80.0 - 100.0 fL   MCH 23.9 (L) 26.0 - 34.0 pg   MCHC 31.6 30.0 - 36.0 g/dL   RDW 16.6 (H) 11.5 - 15.5 %   Platelets 277 150 - 400 K/uL   nRBC 0.0 0.0 - 0.2 %  CBC  Result Value Ref Range   WBC 5.8 4.0 - 10.5 K/uL   RBC 3.71 (L) 4.22 - 5.81 MIL/uL   Hemoglobin 9.3 (L) 13.0 - 17.0 g/dL   HCT 28.5 (L) 39.0 - 52.0 %   MCV 76.8 (L) 80.0 - 100.0 fL    MCH 25.1 (L) 26.0 - 34.0 pg   MCHC 32.6 30.0 - 36.0 g/dL   RDW 16.9 (H) 11.5 - 15.5 %   Platelets 265 150 - 400 K/uL   nRBC 0.0 0.0 - 0.2 %  Hemoglobin and hematocrit, blood  Result Value  Ref Range   Hemoglobin 10.0 (L) 13.0 - 17.0 g/dL   HCT 30.5 (L) 39.0 - 52.0 %  CBC  Result Value Ref Range   WBC 5.1 4.0 - 10.5 K/uL   RBC 3.76 (L) 4.22 - 5.81 MIL/uL   Hemoglobin 9.3 (L) 13.0 - 17.0 g/dL   HCT 28.9 (L) 39.0 - 52.0 %   MCV 76.9 (L) 80.0 - 100.0 fL   MCH 24.7 (L) 26.0 - 34.0 pg   MCHC 32.2 30.0 - 36.0 g/dL   RDW 17.5 (H) 11.5 - 15.5 %   Platelets 275 150 - 400 K/uL   nRBC 0.0 0.0 - 0.2 %  ECHOCARDIOGRAM COMPLETE  Result Value Ref Range   Weight 2,880 oz   Height 65 in   BP 129/45 mmHg  Type and screen Abanda  Result Value Ref Range   ABO/RH(D) O NEG    Antibody Screen NEG    Sample Expiration 04/13/2019,2359    Unit Number [id number]    Blood Component Type RED CELLS,LR    Unit division 00    Status of Unit ISSUED,FINAL    Transfusion Status OK TO TRANSFUSE    Crossmatch Result      Compatible Performed at [facility], [address], [place], [place] [address]   Prepare RBC  Result Value Ref Range   Order Confirmation      ORDER PROCESSED BY BLOOD BANK Performed at [facility], [place] [address], [place], [place] [address]   ABO/Rh  Result Value Ref Range   ABO/RH(D)      O NEG Performed at [facility] [address] [place], [place] [address]   Surgical pathology  Result Value Ref Range   SURGICAL PATHOLOGY      SURGICAL PATHOLOGY CASE: [id number] PATIENT: Dorris Singh Surgical Pathology Report     Clinical History: IDA, R/O H. Pylori (cm)     FINAL MICROSCOPIC DIAGNOSIS:  A. COLON, CECUM AND TRANSVERSE, POLYPECTOMY: - Tubular adenoma(s) - Negative for high-grade dysplasia or malignancy  B. DUODENUM, POLYPECTOMY: - Duodenal adenoma (low-grade dysplasia) - Negative for high-grade dysplasia  or carcinoma  C. STOMACH, BIOPSY: - Gastric antral and oxyntic mucosa with mild chronic gastritis - Gastric oxyntic mucosa with parietal cell hyperplasia as can be seen in hypergastrinemic states such as PPI therapy. - Warthin Starry stain is negative for Helicobacter pylori     GROSS DESCRIPTION:  mA: received in formalin are 2 tan-red polyps, each 0.7 cm.  One is inked black, each is bisected, and are submitted in 1 block.  Specimen B: Received in formalin are tan, soft tissue fragments that are submitted in toto. Number: 4 size: 0.1 to 0.2 cm blocks: 1  Specimen  C: Received in formalin are tan, soft tissue fragments that are submitted in toto. Number: 4 size: 0.15 to 0.4 cm blocks: 1  SSW 04/11/2019    Final Diagnosis performed by Jaquita Folds, MD.   Electronically signed 04/12/2019 Technical and / or Professional components performed at [facility]. [facility], [place] [address] [place], [place] [address].  Immunohistochemistry Technical component (if applicable) was performed at [facility]. [address] [place], [place], [place] [address].   IMMUNOHISTOCHEMISTRY DISCLAIMER (if applicable): Some of these immunohistochemical stains may have been developed and the performance characteristics determine by [facility]. Some may not have been cleared or approved by the U.S. Food and Drug Administration. The FDA has determined that  such clearance or approval is not necessary. This test is used for clinical purposes. It should not be regarded as investigational or for research. This labo ratory is certified under the [place] (CLIA-88) as qualified to perform high complexity clinical laboratory testing.  The controls stained appropriately.   BPAM RBC  Result Value Ref Range   ISSUE DATE / TIME [id number]    Blood Product Unit Number [id number]    PRODUCT CODE [id number]    Unit Type  and Rh 9500    Blood Product Expiration Date [id number]   Troponin I (High Sensitivity)  Result Value Ref Range   Troponin I (High Sensitivity) 7 <18 ng/L  Troponin I (High Sensitivity)  Result Value Ref Range   Troponin I (High Sensitivity) 9 <18 ng/L   Dg Chest Port 1 View  Result Date: 04/09/2019 CLINICAL DATA:  Shortness of breath. EXAM: PORTABLE CHEST 1 VIEW COMPARISON:  August 15, 2017 FINDINGS: Bilateral layering effusions with underlying opacities, possibly atelectasis. Stable cardiomegaly. Stable pacemaker. The hila and mediastinum are unchanged. No pneumothorax. IMPRESSION: New bilateral small layering effusions with underlying opacities, likely atelectasis. Electronically Signed   By: Dorise Bullion III M.D   On: 04/09/2019 14:39    EKG EKG Interpretation  Date/Time:  Monday April 09 2019 14:26:29 EDT Ventricular Rate:  80 PR Interval:    QRS Duration: 187 QT Interval:  454 QTC Calculation: 524 R Axis:   -67 Text Interpretation:  Electronic ventricular pacemaker Confirmed by Lajean Saver [phone]) on 04/09/2019 2:46:39 PM   Radiology Dg Chest Port 1 View  Result Date: 04/09/2019 CLINICAL DATA:  Shortness of breath. EXAM: PORTABLE CHEST 1 VIEW COMPARISON:  August 15, 2017 FINDINGS: Bilateral layering effusions with underlying opacities, possibly atelectasis. Stable cardiomegaly. Stable pacemaker. The hila and mediastinum are unchanged. No pneumothorax. IMPRESSION: New bilateral small layering effusions with underlying opacities, likely atelectasis. Electronically Signed   By: Dorise Bullion III M.D   On: 04/09/2019 14:39    Procedures Procedures (including critical care time)  Medications Ordered in ED Medications  0.9 %  sodium chloride infusion (has no administration in time range)  albuterol (VENTOLIN HFA) 108 (90 Base) MCG/ACT inhaler 2 puff (has no administration in time range)  ipratropium (ATROVENT HFA) inhaler 2 puff (has no administration in time  range)     Initial Impression / Assessment and Plan / ED Course  I have reviewed the triage vital signs and the nursing notes.  Pertinent labs & imaging results that were available during my care of the patient were reviewed by me and considered in my medical decision making (see chart for details).  Iv ns. Continuous pulse ox and monitor. Ecg. Pcxr. Stat labs.   Reviewed nursing notes and prior charts for additional history.   Pt is also wheezing, albuterol and atrovent mdi tx.   Given seen by cardiology and sent for admission/possible cath - cardiology consulted for admission - discussed w Trish, cardiology will see in ED/admit.   CXR reviewed by me - bil small effusions.   Labs reviewed by me - chem normal.  Recheck wheezing improved. No current chest pain. Awaiting cardiology eval/admit.   Signed out to Dr Roslynn Amble that cardiology will be admitted, labs pending - check labs when resulted.   Labs reviewed/interpreted by me - hgb v low. Will need hemoccult and transfusion.   CRITICAL CARE RE: unstable angina, severe/symptomatic anemia, bil pleural effusions,  Performed by: Mirna Mires Total critical care time:  40 minutes Critical care time was exclusive of separately billable procedures and treating other patients. Critical care was necessary to treat or prevent imminent or life-threatening deterioration. Critical care was time spent personally by me on the following activities: development of treatment plan with patient and/or surrogate as well as nursing, discussions with consultants, evaluation of patient's response to treatment, examination of patient, obtaining history from patient or surrogate, ordering and performing treatments and interventions, ordering and review of laboratory studies, ordering and review of radiographic studies, pulse oximetry and re-evaluation of patient's condition.  Final Clinical Impressions(s) / ED Diagnoses   Final diagnoses:  Unstable angina  ([place])  Bilateral pleural effusion  Wheezing    ED Discharge Orders    None           Lajean Saver, MD 04/16/19 1346

## 2019-04-09 NOTE — ED Triage Notes (Signed)
Per EMS- Pt here from [facility] in [place] for eval of SOB and chest pain on exertion. Pt has had to sleep sitting up. MD sent him here for admission/ possible heart cath. 95% on room air. Pt reports improvement of effort of breathing with nasal cannula. Pt has a pacemaker.

## 2019-04-09 NOTE — ED Notes (Signed)
Attempted to give report.  Was on hold.

## 2019-04-09 NOTE — ED Notes (Signed)
ED TO INPATIENT HANDOFF REPORT  ED Nurse Name and Phone #: Kathie Rhodes RN  S Name/Age/Gender [name] 84 y.o. male Room/Bed: 041C/041C  Code Status   Code Status: Prior  Home/SNF/Other Home Patient oriented to: self, place, time and situation Is this baseline? Yes   Triage Complete: Triage complete  Chief Complaint sob  Triage Note Per EMS- Pt [facility] from [facility] [place] for eval of SOB and chest pain on exertion. Pt has had to sleep sitting up. MD sent him here for admission/ possible heart cath. 95% on room air. Pt reports improvement of effort of breathing with nasal cannula. Pt has a pacemaker.    Allergies No Known Allergies  Level of Care/Admitting Diagnosis ED Disposition    ED Disposition Condition [facility] Area: [place] [[id number]]  Level of Care: Telemetry Cardiac [103]  Covid Evaluation: Asymptomatic Screening Protocol (No Symptoms)  Diagnosis: Unstable angina [id number]  Admitting Physician: Deetta Perla  Attending Physician: Fay Records [[id number]]  PT Class (Do Not Modify): Observation [104]  PT Acc Code (Do Not Modify): Observation [10022]       B Medical/Surgery History Past Medical History:  Diagnosis Date  . Arthritis   . Asthma   . Coronary artery disease   . GERD (gastroesophageal reflux disease)   . Hypertension   . Peripheral vascular disease [facility])    Past Surgical History:  Procedure Laterality Date  . CATARACT EXTRACTION    . HIP SURGERY    . INSERT / REPLACE / REMOVE PACEMAKER     [employer]  . JOINT REPLACEMENT Right 10/20/1994   Hip  (Left Hip in 10/96)  . LEFT HEART CATH AND CORONARY ANGIOGRAPHY N/A 02/08/2017   Procedure: LEFT HEART CATH AND CORONARY ANGIOGRAPHY;  Surgeon: Martinique, Peter M, MD;  Location: [place] [facility];  Service: Cardiovascular;  Laterality: N/A;  . ORIF PERIPROSTHETIC FRACTURE Right 08/17/2017   Procedure: OPEN REDUCTION INTERNAL FIXATION  (ORIF) PERIPROSTHETIC FRACTURE RIGHT FEMUR;  Surgeon: Paralee Cancel, MD;  [facility];  Service: Orthopedics;  Laterality: Right;  . ORIF PERIPROSTHETIC FRACTURE Right 01/23/2018   Procedure: Redo open reduction internal fixation right periprosthetic femur fracture;  Surgeon: Paralee Cancel, MD;  [facility];  Service: Orthopedics;  Laterality: Right;  120 mins     A IV Location/Drains/Wounds Patient Lines/Drains/Airways Status   Active Line/Drains/Airways    Name:   Placement date:   Placement time:   Site:   Days:   Peripheral IV 04/09/19 Left Hand   04/09/19    1414    Hand   less than 1   Incision (Closed) 01/23/18 Hip Right   01/23/18    1603     441          Intake/Output Last 24 hours No intake or output data in the 24 hours ending 04/09/19 2001  Labs/Imaging Results for orders placed or performed during the hospital encounter of 04/09/19 (from the past 48 hour(s))  CBC     Status: Abnormal   Collection Time: 04/09/19  2:53 PM  Result Value Ref Range   WBC 5.0 4.0 - 10.5 K/uL   RBC 3.19 (L) 4.22 - 5.81 MIL/uL   Hemoglobin 7.6 (L) 13.0 - 17.0 g/dL    Comment: Reticulocyte Hemoglobin testing may be clinically indicated, consider ordering this additional test [id number]    HCT 24.8 (L) 39.0 - 52.0 %   MCV 77.7 (L)  80.0 - 100.0 fL   MCH 23.8 (L) 26.0 - 34.0 pg   MCHC 30.6 30.0 - 36.0 g/dL   RDW 16.7 (H) 11.5 - 15.5 %   Platelets 253 150 - 400 K/uL   nRBC 0.0 0.0 - 0.2 %    Comment: Performed at [facility] [address], [place], [place] [address]  Comprehensive metabolic panel     Status: Abnormal   Collection Time: 04/09/19  2:53 PM  Result Value Ref Range   Sodium 137 135 - 145 mmol/L   Potassium 4.4 3.5 - 5.1 mmol/L   Chloride 106 98 - 111 mmol/L   CO2 22 22 - 32 mmol/L   Glucose, Bld 97 70 - 99 mg/dL   BUN 14 8 - 23 mg/dL   Creatinine, Ser 1.43 (H) 0.61 - 1.24 mg/dL   Calcium 8.8 (L) 8.9 - 10.3 mg/dL   Total Protein 6.4 (L) 6.5 - 8.1 g/dL    Albumin 3.4 (L) 3.5 - 5.0 g/dL   AST 18 15 - 41 U/L   ALT 12 0 - 44 U/L   Alkaline Phosphatase 64 38 - 126 U/L   Total Bilirubin 0.4 0.3 - 1.2 mg/dL   GFR calc non Af Amer 45 (L) >60 mL/min   GFR calc Af Amer 52 (L) >60 mL/min   Anion gap 9 5 - 15    Comment: Performed at [facility] [address], [place], [place] [address]  Brain natriuretic peptide     Status: Abnormal   Collection Time: 04/09/19  2:53 PM  Result Value Ref Range   B Natriuretic Peptide 286.1 (H) 0.0 - 100.0 pg/mL    Comment: Performed at [facility] [address] [place], [place] [address]  Troponin I (High Sensitivity)     Status: None   Collection Time: 04/09/19  2:53 PM  Result Value Ref Range   Troponin I (High Sensitivity) 7 <18 ng/L    Comment: (NOTE) Elevated high sensitivity troponin I (hsTnI) values and significant  changes across serial measurements may suggest ACS but many other  chronic and acute conditions are known to elevate hsTnI results.  Refer to the "Links" section for chest pain algorithms and additional  guidance. Performed at [facility], [place] [address], [place], [place] [address]   SARS CORONAVIRUS 2 (TAT 6-24 HRS) Nasopharyngeal Nasopharyngeal Swab     Status: None   Collection Time: 04/09/19  2:53 PM   Specimen: Nasopharyngeal Swab  Result Value Ref Range   SARS Coronavirus 2 NEGATIVE NEGATIVE    Comment: (NOTE) SARS-CoV-2 target nucleic acids are NOT DETECTED. The SARS-CoV-2 RNA is generally detectable in upper and lower respiratory specimens during the acute phase of infection. Negative results do not preclude SARS-CoV-2 infection, do not rule out co-infections with other pathogens, and should not be used as the sole basis for treatment or other patient management decisions. Negative results must be combined with clinical observations, patient history, and epidemiological information. The expected result is Negative. Fact Sheet for  Patients: SugarRoll.be Fact Sheet for Healthcare Providers: https://www.woods-mathews.com/ This test is not yet approved or cleared by the [place] FDA and  has been authorized for detection and/or diagnosis of SARS-CoV-2 by FDA under an Emergency Use Authorization (EUA). This EUA will remain  in effect (meaning this test can be used) for the duration of the COVID-19 declaration under Section 56 4(b)(1) of the Act, 21 U.S.C. section 360bbb-3(b)(1), unless the authorization  is terminated or revoked sooner. Performed at [facility], [place] [address] [place], [place] [address]   Troponin I (High Sensitivity)     Status: None   Collection Time: 04/09/19  5:28 PM  Result Value Ref Range   Troponin I (High Sensitivity) 9 <18 ng/L    Comment: (NOTE) Elevated high sensitivity troponin I (hsTnI) values and significant  changes across serial measurements may suggest ACS but many other  chronic and acute conditions are known to elevate hsTnI results.  Refer to the "Links" section for chest pain algorithms and additional  guidance. Performed at [facility], [place] [address], [place], [place] [address]   Vitamin B12     Status: None   Collection Time: 04/09/19  5:28 PM  Result Value Ref Range   Vitamin B-12 372 180 - 914 pg/mL    Comment: (NOTE) This assay is not validated for testing neonatal or myeloproliferative syndrome specimens for Vitamin B12 levels. Performed at [facility], [place] [address] [place], [place] [address]   Folate     Status: None   Collection Time: 04/09/19  5:28 PM  Result Value Ref Range   Folate 12.8 >5.9 ng/mL    Comment: Performed at [facility], [place] [address], [place], [place] [address]  Iron and TIBC     Status: Abnormal   Collection Time: 04/09/19  5:28 PM  Result Value Ref Range   Iron 10 (L) 45 - 182 ug/dL   TIBC 470 (H) 250 - 450 ug/dL   Saturation Ratios 2 (L) 17.9 - 39.5 %    UIBC 460 ug/dL    Comment: Performed at [facility], [place] [address] [place], [place] [address]  Ferritin     Status: Abnormal   Collection Time: 04/09/19  5:28 PM  Result Value Ref Range   Ferritin 14 (L) 24 - 336 ng/mL    Comment: Performed at [facility], [place] [address], [place], [place] [address]  Reticulocytes     Status: Abnormal   Collection Time: 04/09/19  5:28 PM  Result Value Ref Range   Retic Ct Pct 1.6 0.4 - 3.1 %   RBC. 3.41 (L) 4.22 - 5.81 MIL/uL   Retic Count, Absolute 52.9 19.0 - 186.0 K/uL   Immature Retic Fract 22.8 (H) 2.3 - 15.9 %    Comment: Performed at [facility] [address] [place], [place] [address]  TSH     Status: None   Collection Time: 04/09/19  5:28 PM  Result Value Ref Range   TSH 2.307 0.350 - 4.500 uIU/mL    Comment: Performed by a 3rd Generation assay with a functional sensitivity of <=0.01 uIU/mL. Performed at [facility], [place] [address], [place],  [address]    Dg Chest Port 1 View  Result Date: 04/09/2019 CLINICAL DATA:  Shortness of breath. EXAM: PORTABLE CHEST 1 VIEW COMPARISON:  August 15, 2017 FINDINGS: Bilateral layering effusions with underlying opacities, possibly atelectasis. Stable cardiomegaly. Stable pacemaker. The hila and mediastinum are unchanged. No pneumothorax. IMPRESSION: New bilateral small layering effusions with underlying opacities, likely atelectasis. Electronically Signed   By: Dorise Bullion III M.D   On: 04/09/2019 14:39    Pending Labs Unresulted Labs (From admission, onward)    Start     Ordered   04/11/19 0500  APTT  Daily,   R     04/09/19 1906   04/11/19 0500  Heparin level (unfractionated)  Daily,   R     04/09/19 1906   04/10/19 0500  CBC  Daily,   R     04/09/19 1906   04/10/19 0400  Heparin level (unfractionated)  Once-Timed,   STAT     04/09/19 1906   04/10/19 0400  APTT  ONCE - STAT,   STAT     04/09/19 1906   04/09/19 2100  CBC  Once,   STAT     04/09/19  1711   04/09/19 1703  Occult blood card to lab, stool  Once,   STAT     04/09/19 1711   Signed and Held  CBC  Daily,   R     Signed and Held   Signed and Held  Lipid panel  Tomorrow morning,   R     Signed and Held   Signed and Held  Basic metabolic panel  Tomorrow morning,   R     Signed and Held          Vitals/Pain Today's Vitals   04/09/19 1830 04/09/19 1845 04/09/19 1900 04/09/19 1930  BP: (!) 167/73  (!) 185/65 (!) 186/63  Pulse:  63 67 61  Resp: 19 16 17 13   Temp:      TempSrc:      SpO2:  100% 100% 100%  PainSc:        Isolation Precautions No active isolations  Medications Medications  0.9 %  sodium chloride infusion (has no administration in time range)  albuterol (VENTOLIN HFA) 108 (90 Base) MCG/ACT inhaler 2 puff (2 puffs Inhalation Given 04/09/19 1839)  ipratropium (ATROVENT HFA) inhaler 2 puff (has no administration in time range)  heparin ADULT infusion 100 units/mL (25000 units/258mL sodium chloride 0.45%) (has no administration in time range)  furosemide (LASIX) injection 80 mg (80 mg Intravenous Given 04/09/19 1838)    Mobility Hight fall risk  High fall risk   Focused Assessments Cardiac Assessment Handoff:  Cardiac Rhythm: Ventricular paced Lab Results  Component Value Date   CKTOTAL 1,140 (H) 07/03/2007   CKMB 3.2 07/03/2007   TROPONINI 0.03 (HH) 08/19/2017   No results found for: DDIMER Does the Patient currently have chest pain? No     R Recommendations: See Admitting Provider Note  Report given to:   Additional Notes: pt alert and oriented at this time/covid negative

## 2019-04-10 ENCOUNTER — Observation Stay (HOSPITAL_BASED_OUTPATIENT_CLINIC_OR_DEPARTMENT_OTHER): Payer: PPO

## 2019-04-10 ENCOUNTER — Encounter (HOSPITAL_COMMUNITY)
Admission: EM | Disposition: A | Discharge status: Home / Self Care | Payer: Self-pay | Source: Ambulatory Visit | Attending: Internal Medicine

## 2019-04-10 DIAGNOSIS — K2990 Gastroduodenitis, unspecified, without bleeding: Secondary | ICD-10-CM | POA: Diagnosis present

## 2019-04-10 DIAGNOSIS — I739 Peripheral vascular disease, unspecified: Secondary | ICD-10-CM | POA: Diagnosis present

## 2019-04-10 DIAGNOSIS — Z7901 Long term (current) use of anticoagulants: Secondary | ICD-10-CM | POA: Diagnosis not present

## 2019-04-10 DIAGNOSIS — E785 Hyperlipidemia, unspecified: Secondary | ICD-10-CM | POA: Diagnosis present

## 2019-04-10 DIAGNOSIS — K219 Gastro-esophageal reflux disease without esophagitis: Secondary | ICD-10-CM | POA: Diagnosis present

## 2019-04-10 DIAGNOSIS — K317 Polyp of stomach and duodenum: Secondary | ICD-10-CM | POA: Diagnosis present

## 2019-04-10 DIAGNOSIS — Z950 Presence of cardiac pacemaker: Secondary | ICD-10-CM | POA: Diagnosis not present

## 2019-04-10 DIAGNOSIS — K449 Diaphragmatic hernia without obstruction or gangrene: Secondary | ICD-10-CM | POA: Diagnosis present

## 2019-04-10 DIAGNOSIS — Z8249 Family history of ischemic heart disease and other diseases of the circulatory system: Secondary | ICD-10-CM | POA: Diagnosis not present

## 2019-04-10 DIAGNOSIS — N183 Chronic kidney disease, stage 3 unspecified: Secondary | ICD-10-CM | POA: Diagnosis present

## 2019-04-10 DIAGNOSIS — E039 Hypothyroidism, unspecified: Secondary | ICD-10-CM | POA: Diagnosis present

## 2019-04-10 DIAGNOSIS — I361 Nonrheumatic tricuspid (valve) insufficiency: Secondary | ICD-10-CM

## 2019-04-10 DIAGNOSIS — I340 Nonrheumatic mitral (valve) insufficiency: Secondary | ICD-10-CM

## 2019-04-10 DIAGNOSIS — D509 Iron deficiency anemia, unspecified: Secondary | ICD-10-CM | POA: Diagnosis present

## 2019-04-10 DIAGNOSIS — Z79890 Hormone replacement therapy (postmenopausal): Secondary | ICD-10-CM | POA: Diagnosis not present

## 2019-04-10 DIAGNOSIS — J45909 Unspecified asthma, uncomplicated: Secondary | ICD-10-CM | POA: Diagnosis present

## 2019-04-10 DIAGNOSIS — K635 Polyp of colon: Secondary | ICD-10-CM | POA: Diagnosis present

## 2019-04-10 DIAGNOSIS — I25110 Atherosclerotic heart disease of native coronary artery with unstable angina pectoris: Secondary | ICD-10-CM | POA: Diagnosis present

## 2019-04-10 DIAGNOSIS — I130 Hypertensive heart and chronic kidney disease with heart failure and stage 1 through stage 4 chronic kidney disease, or unspecified chronic kidney disease: Secondary | ICD-10-CM | POA: Diagnosis present

## 2019-04-10 DIAGNOSIS — Z79899 Other long term (current) drug therapy: Secondary | ICD-10-CM | POA: Diagnosis not present

## 2019-04-10 DIAGNOSIS — I5031 Acute diastolic (congestive) heart failure: Secondary | ICD-10-CM | POA: Diagnosis present

## 2019-04-10 DIAGNOSIS — K2970 Gastritis, unspecified, without bleeding: Secondary | ICD-10-CM | POA: Diagnosis not present

## 2019-04-10 DIAGNOSIS — D132 Benign neoplasm of duodenum: Secondary | ICD-10-CM | POA: Diagnosis present

## 2019-04-10 DIAGNOSIS — Z20828 Contact with and (suspected) exposure to other viral communicable diseases: Secondary | ICD-10-CM | POA: Diagnosis present

## 2019-04-10 DIAGNOSIS — I6521 Occlusion and stenosis of right carotid artery: Secondary | ICD-10-CM | POA: Diagnosis present

## 2019-04-10 DIAGNOSIS — D649 Anemia, unspecified: Secondary | ICD-10-CM | POA: Diagnosis not present

## 2019-04-10 DIAGNOSIS — I480 Paroxysmal atrial fibrillation: Secondary | ICD-10-CM | POA: Diagnosis present

## 2019-04-10 DIAGNOSIS — M1990 Unspecified osteoarthritis, unspecified site: Secondary | ICD-10-CM | POA: Diagnosis present

## 2019-04-10 LAB — CBC
HCT: 23.7 % — ABNORMAL LOW (ref 39.0–52.0)
Hemoglobin: 7.4 g/dL — ABNORMAL LOW (ref 13.0–17.0)
MCH: 23.6 pg — ABNORMAL LOW (ref 26.0–34.0)
MCHC: 31.2 g/dL (ref 30.0–36.0)
MCV: 75.5 fL — ABNORMAL LOW (ref 80.0–100.0)
Platelets: 264 10*3/uL (ref 150–400)
RBC: 3.14 MIL/uL — ABNORMAL LOW (ref 4.22–5.81)
RDW: 16.5 % — ABNORMAL HIGH (ref 11.5–15.5)
WBC: 4.8 10*3/uL (ref 4.0–10.5)
nRBC: 0 % (ref 0.0–0.2)

## 2019-04-10 LAB — LIPID PANEL
Cholesterol: 119 mg/dL (ref 0–200)
HDL: 54 mg/dL (ref >40)
LDL Cholesterol: 54 mg/dL (ref 0–99)
Total CHOL/HDL Ratio: 2.2 RATIO
Triglycerides: 56 mg/dL (ref <150)
VLDL: 11 mg/dL (ref 0–40)

## 2019-04-10 LAB — PREPARE RBC (CROSSMATCH)

## 2019-04-10 LAB — BASIC METABOLIC PANEL
Anion gap: 10 (ref 5–15)
BUN: 13 mg/dL (ref 8–23)
CO2: 25 mmol/L (ref 22–32)
Calcium: 9.2 mg/dL (ref 8.9–10.3)
Chloride: 104 mmol/L (ref 98–111)
Creatinine, Ser: 1.47 mg/dL — ABNORMAL HIGH (ref 0.61–1.24)
GFR calc Af Amer: 51 mL/min — ABNORMAL LOW (ref >60)
GFR calc non Af Amer: 44 mL/min — ABNORMAL LOW (ref >60)
Glucose, Bld: 87 mg/dL (ref 70–99)
Potassium: 4 mmol/L (ref 3.5–5.1)
Sodium: 139 mmol/L (ref 135–145)

## 2019-04-10 LAB — HEMOGLOBIN AND HEMATOCRIT, BLOOD
HCT: 30.5 % — ABNORMAL LOW (ref 39.0–52.0)
Hemoglobin: 10 g/dL — ABNORMAL LOW (ref 13.0–17.0)

## 2019-04-10 LAB — ECHOCARDIOGRAM COMPLETE
Height: 65 in
Weight: 2880 oz

## 2019-04-10 LAB — HEPARIN LEVEL (UNFRACTIONATED): Heparin Unfractionated: >2.2 IU/mL — ABNORMAL HIGH (ref 0.30–0.70)

## 2019-04-10 LAB — ABO/RH: ABO/RH(D): O NEG

## 2019-04-10 LAB — APTT: aPTT: 65 seconds — ABNORMAL HIGH (ref 24–36)

## 2019-04-10 SURGERY — LEFT HEART CATH AND CORONARY ANGIOGRAPHY
Anesthesia: LOCAL

## 2019-04-10 MED ORDER — PEG-KCL-NACL-NASULF-NA ASC-C 100 G PO SOLR
0.5000 | Freq: Once | ORAL | Status: AC
  Administered 2019-04-10: 100 g via ORAL
  Filled 2019-04-10: qty 1

## 2019-04-10 MED ORDER — SODIUM CHLORIDE 0.9% IV SOLUTION
Freq: Once | INTRAVENOUS | Status: AC
  Administered 2019-04-10: 13:00:00 via INTRAVENOUS

## 2019-04-10 MED ORDER — SODIUM CHLORIDE 0.9 % IV SOLN
INTRAVENOUS | Status: DC
  Administered 2019-04-10: 06:00:00 via INTRAVENOUS

## 2019-04-10 MED ORDER — SODIUM CHLORIDE 0.9 % IV SOLN
510.0000 mg | Freq: Once | INTRAVENOUS | Status: AC
  Administered 2019-04-10: 510 mg via INTRAVENOUS
  Filled 2019-04-10: qty 17

## 2019-04-10 MED ORDER — PEG-KCL-NACL-NASULF-NA ASC-C 100 G PO SOLR: 1.0000 | Freq: Once | ORAL | Status: DC

## 2019-04-10 NOTE — Progress Notes (Signed)
ANTICOAGULATION CONSULT NOTE - Follow Up Consult  Pharmacy Consult for heparin Indication: Afib and CP  Labs: Recent Labs    04/09/19 1453 04/09/19 1728 04/09/19 2105 04/10/19 0448  HGB 7.6*  --  8.0* 7.4*  HCT 24.8*  --  25.3* 23.7*  PLT 253  --  277 264  APTT  --   --   --  65*  CREATININE 1.43*  --   --   --   TROPONINIHS 7 9  --   --      Assessment/Plan:  84yo male slightly subtherapeutic on heparin with initial dosing while Eliquis on hold though will likely continue to accumulate. Will continue gtt at current rate and check additional PTT.   Wynona Neat, PharmD, BCPS  04/10/2019,5:33 AM

## 2019-04-10 NOTE — Progress Notes (Signed)
   AM CBC with Hgb 7.4. Patient presented with chest pain and SOB c/f unstable angina, though could be c/b symptomatic anemia. Anemia panel with iron deficiency. Discussed with Dr. Harrington Challenger and patient.  Will transfuse 1 uPRBC this AM and give IV iron x1 dose.   Abigail Butts, PA-C 04/10/19; 7:41 AM

## 2019-04-10 NOTE — Consult Note (Addendum)
Consultation  Referring Provider: Dr. Harrington Challenger    Primary Care Physician:  Lillard Anes, MD Primary Gastroenterologist: Althia Forts       Reason for Consultation: IDA             HPI:   [name] is a 84 y.o. male with a past medical history of nonobstructive CAD, chronic combined CHF (echo 04/10/2019 EF 60-65 % with mild aortic valve stenosis), s/p Pacemaker, paroxysmal A. fib on Eliquis, PVD, CKD stage III and reflux, who presented to the hospital on 04/09/2019 with shortness of breath, we are consulted in regards to an iron deficiency anemia discovered at admission.    Today, the patient explains that over the past 2 weeks he had noticed increased shortness of breath with exertion and some chest pressure.  Last week it was so bad that if he got up and would walk several steps, then he was short of breath and had to stop and sit down.  Associated symptoms include lightheadedness and chest tightness.    Last dose of Eliquis was the morning of 04/09/2019.  His heparin was discontinued this morning around 8 AM.    History of previous colonoscopy but patient tells me he was so long ago he does not know where it was done or who did it.    Denies any GI complaints including melena, hematochezia, fever, chills, weight loss, change in bowel habits, heartburn, reflux, nausea, vomiting or abdominal pain.  ER work-up: Hemoglobin 7.6, troponin elevated at 7  Past Medical History:  Diagnosis Date   Arthritis    Asthma    Coronary artery disease    GERD (gastroesophageal reflux disease)    Hypertension    Peripheral vascular disease ([place])     Past Surgical History:  Procedure Laterality Date   CATARACT EXTRACTION     HIP SURGERY     INSERT / REPLACE / REMOVE PACEMAKER     [employer]   JOINT REPLACEMENT Right 10/20/1994   Hip  (Left Hip in 10/96)   LEFT HEART CATH AND CORONARY ANGIOGRAPHY N/A 02/08/2017   Procedure: LEFT HEART CATH AND CORONARY ANGIOGRAPHY;  Surgeon:  Martinique, Peter M, MD;  Location: [place] [facility];  Service: Cardiovascular;  Laterality: N/A;   ORIF PERIPROSTHETIC FRACTURE Right 08/17/2017   Procedure: OPEN REDUCTION INTERNAL FIXATION (ORIF) PERIPROSTHETIC FRACTURE RIGHT FEMUR;  Surgeon: Paralee Cancel, MD;  [facility];  Service: Orthopedics;  Laterality: Right;   ORIF PERIPROSTHETIC FRACTURE Right 01/23/2018   Procedure: Redo open reduction internal fixation right periprosthetic femur fracture;  Surgeon: Paralee Cancel, MD;  [facility];  Service: Orthopedics;  Laterality: Right;  120 mins    Family History  Problem Relation Age of Onset   Heart attack Mother      Social History   Tobacco Use   Smoking status: Never Smoker   Smokeless tobacco: Never Used  Substance Use Topics   Alcohol use: Yes    Alcohol/week: 1.0 standard drinks    Types: 1 Cans of beer per week    Comment: occ   Drug use: No    Prior to Admission medications   Medication Sig Start Date End Date Taking? Authorizing Provider  albuterol (PROVENTIL HFA;VENTOLIN HFA) 108 (90 Base) MCG/ACT inhaler Inhale 2 puffs into the lungs 4 (four) times daily as needed for wheezing or shortness of breath.   Yes [provider]  amLODipine (NORVASC) 5 MG tablet TAKE 1 TABLET(5 MG) BY MOUTH DAILY Patient  taking differently: Take 5 mg by mouth at bedtime.  03/27/19  Yes Park Liter, MD  apixaban (ELIQUIS) 5 MG TABS tablet Take 1 tablet (5 mg total) by mouth 2 (two) times daily. *PLEASE SCHEDULE APPOINTMENT FOR FURTHER REFILLS* 03/13/19  Yes Park Liter, MD  atorvastatin (LIPITOR) 40 MG tablet Take 40 mg by mouth at bedtime.    Yes [provider]  Calcium Carbonate-Vitamin D (CALCIUM 500 + D PO) Take 1 tablet by mouth daily.   Yes [provider]  chlorthalidone (HYGROTON) 25 MG tablet Take 1 tablet (25 mg total) by mouth daily. 02/09/17  Yes Cheryln Manly, NP  fluticasone (FLOVENT HFA) 110 MCG/ACT inhaler Inhale 2  puffs into the lungs daily.    Yes [provider]  HYDROcodone-acetaminophen (NORCO/VICODIN) 5-325 MG tablet Take 1 tablet by mouth 2 (two) times daily as needed (pain).  03/26/19  Yes [provider]  isosorbide mononitrate (IMDUR) 60 MG 24 hr tablet Take 60 mg by mouth at bedtime.    Yes [provider]  levothyroxine (SYNTHROID, LEVOTHROID) 50 MCG tablet Take 50 mcg by mouth at bedtime.    Yes [provider]  Multiple Vitamin (MULTIVITAMIN WITH MINERALS) TABS tablet Take 1 tablet by mouth daily.   Yes [provider]  nitroGLYCERIN (NITROSTAT) 0.4 MG SL tablet DISSOLVE 1 TABLET UNDER THE TONGUE EVERY 5 MINUTES AS NEEDED FOR CHEST PAIN. MAX 3 DOSES IN 15 MINUTES Patient taking differently: Place 0.4 mg under the tongue every 5 (five) minutes as needed for chest pain (max 3 doses in 15 minutes).  11/06/18  Yes Park Liter, MD  omeprazole (PRILOSEC OTC) 20 MG tablet Take 2 tablets (40 mg total) by mouth daily. Patient taking differently: Take 20 mg by mouth every morning.  08/20/17  Yes Hosie Poisson, MD  ranolazine (RANEXA) 500 MG 12 hr tablet TAKE 1 TABLET(500 MG) BY MOUTH TWICE DAILY Patient taking differently: Take 500 mg by mouth 2 (two) times daily.  01/16/19  Yes Park Liter, MD  vitamin B-12 (CYANOCOBALAMIN) 1000 MCG tablet Take 1,000 mcg by mouth daily.    Yes [provider]    Current Facility-Administered Medications  Medication Dose Route Frequency Provider Last Rate Last Dose   0.9 %  sodium chloride infusion (Manually program via Guardrails IV Fluids)   Intravenous Once Kroeger, Krista M., PA-C       0.9 %  sodium chloride infusion   Intravenous Continuous Kroeger, Krista M., PA-C       acetaminophen (TYLENOL) tablet 650 mg  650 mg Oral Q4H PRN Roby Lofts M., PA-C   650 mg at 04/10/19 [id number]   albuterol (PROVENTIL) (2.5 MG/3ML) 0.083% nebulizer solution 2.5 mg  2.5 mg Nebulization Q4H PRN Fay Records, MD        amLODipine (NORVASC) tablet 5 mg  5 mg Oral QHS Kroeger, Krista M., PA-C   5 mg at 04/09/19 2135   atorvastatin (LIPITOR) tablet 40 mg  40 mg Oral QHS Roby Lofts M., PA-C   40 mg at 04/09/19 2135   budesonide (PULMICORT) nebulizer solution 0.25 mg  0.25 mg Nebulization BID Fay Records, MD   0.25 mg at 04/10/19 0740   ferumoxytol [facility]) 510 mg in sodium chloride 0.9 % 100 mL IVPB  510 mg Intravenous Once Kroeger, Krista M., PA-C       ipratropium (ATROVENT HFA) inhaler 2 puff  2 puff Inhalation Once Abigail Butts., PA-C  isosorbide mononitrate (IMDUR) 24 hr tablet 60 mg  60 mg Oral QHS Roby Lofts M., PA-C   60 mg at 04/09/19 2135   levothyroxine (SYNTHROID) tablet 50 mcg  50 mcg Oral [id number] Abigail Butts., PA-C   50 mcg at 04/10/19 [id number]   nitroGLYCERIN (NITROSTAT) SL tablet 0.4 mg  0.4 mg Sublingual Q5 min PRN Tommye Standard, Daleen Snook M., PA-C       ondansetron [facility]) injection 4 mg  4 mg Intravenous Q6H PRN Tommye Standard, Krista M., PA-C       pantoprazole (PROTONIX) EC tablet 40 mg  40 mg Oral Daily Fay Records, MD       ranolazine (RANEXA) 12 hr tablet 500 mg  500 mg Oral BID Kroeger, Krista M., PA-C   500 mg at 04/09/19 2135   vitamin B-12 (CYANOCOBALAMIN) tablet 1,000 mcg  1,000 mcg Oral Daily Kroeger, Krista M., PA-C   1,000 mcg at 04/09/19 2149    Allergies as of 04/09/2019   (No Known Allergies)     Review of Systems:    Constitutional: No weight loss, fever or chills Skin: No rash  Cardiovascular: No chest pain Respiratory: +DOE Gastrointestinal: See HPI and otherwise negative Genitourinary: No dysuria  Neurological: No headache, dizziness or syncope Musculoskeletal: No new muscle or joint pain Hematologic: No bleeding  Psychiatric: No history of depression or anxiety    Physical Exam:  Vital signs in last 24 hours: Temp:  [97.7 F (36.5 C)-98.3 F (36.8 C)] 98.2 F (36.8 C) (10/20 0749) Pulse Rate:  [60-72] 62 (10/20 0749) Resp:  [12-20]  20 (10/20 0749) BP: (124-195)/(45-79) 129/45 (10/20 0749) SpO2:  [95 %-100 %] 95 % (10/20 0749) Weight:  [81.6 kg-87.1 kg] 81.6 kg (10/20 0429)   General:   Pleasant Caucasian male appears to be in NAD, Well developed, Well nourished, alert and cooperative Head:  Normocephalic and atraumatic. Eyes:   PEERL, EOMI. No icterus. Conjunctiva pink. Ears:  Normal auditory acuity. Neck:  Supple Throat: Oral cavity and pharynx without inflammation, swelling or lesion.  Lungs: Respirations even and unlabored. Lungs clear to auscultation bilaterally.   No wheezes, crackles, or rhonchi.  Heart: Normal S1, S2. +murmur Regular rate and rhythm. No peripheral edema, cyanosis or pallor.  Abdomen:  Soft, nondistended, nontender. No rebound or guarding. Normal bowel sounds. No appreciable masses or hepatomegaly. Rectal:  Not performed.  Msk:  Symmetrical without gross deformities. Peripheral pulses intact.  Extremities:  Without edema, no deformity or joint abnormality. Normal ROM, normal sensation. Neurologic:  Alert and  oriented x4;  grossly normal neurologically.  Skin:   Dry and intact without significant lesions or rashes. Psychiatric: Demonstrates good judgement and reason without abnormal affect or behaviors.   LAB RESULTS: Recent Labs    04/09/19 1453 04/09/19 2105 04/10/19 0448  WBC 5.0 5.1 4.8  HGB 7.6* 8.0* 7.4*  HCT 24.8* 25.3* 23.7*  PLT 253 277 264   BMET Recent Labs    04/09/19 1453 04/10/19 0448  NA 137 139  K 4.4 4.0  CL 106 104  CO2 22 25  GLUCOSE 97 87  BUN 14 13  CREATININE 1.43* 1.47*  CALCIUM 8.8* 9.2   LFT Recent Labs    04/09/19 1453  PROT 6.4*  ALBUMIN 3.4*  AST 18  ALT 12  ALKPHOS 64  BILITOT 0.4   STUDIES: Dg Chest Port 1 View  Result Date: 04/09/2019 CLINICAL DATA:  Shortness of breath. EXAM: PORTABLE CHEST 1 VIEW COMPARISON:  August 15, 2017 FINDINGS:  Bilateral layering effusions with underlying opacities, possibly atelectasis. Stable  cardiomegaly. Stable pacemaker. The hila and mediastinum are unchanged. No pneumothorax. IMPRESSION: New bilateral small layering effusions with underlying opacities, likely atelectasis. Electronically Signed   By: Dorise Bullion III M.D   On: 04/09/2019 14:39    Impression / Plan:   Impression: 1.  IDA: Hemoglobin 7.4, iron studies consistent with iron deficiency, 1 unit PRBCs has been ordered as well as guaiac studies and IV iron x1; Consider GI source of blood loss vs iron malabsorption vs other 2.  Chronic anticoagulation for A. fib: Was on Eliquis which has been on hold since yesterday morning 04/09/2019, heparin now off per nursing staff  Plan: 1.  Discussed EGD and Colonoscopy with the patient today.  He agrees to proceed.  These will be scheduled tomorrow with Dr. Ardis Hughs.  Did discuss risks, benefits, limitations and alternatives. 2.  Continue to hold heparin and Eliquis 3.  Patient will be on clears for the rest of today, n.p.o. after midnight 4.  Ordered movi prep 5.  Agree with blood transfusion and iron infusion. Hgb will need to be above 7 for procedures tomorrow. 6.  Please await any further recommendations from Dr. Ardis Hughs later today.  COVID NEGATIVE 04/09/19  Thank you for your kind consultation, we will continue to follow.  Lavone Nian [facility]  04/10/2019, 10:48 AM  ________________________________________________________________________  Velora Heckler GI MD note:  I personally examined the patient, reviewed the data and agree with the assessment and plan described above.  Signficant, symptomatic IDA in patient on eliquis for afib.  No overt bleeding of any kind.  Colonoscopy many many years ago.  He understands that I recommend colonoscopy and EGD as workup. He is not very enthused about the idea and I won't be surprised if he declines to take the colonoscopy prep when it is offered later this afternoon.   Owens Loffler, MD [facility]astro[facility] Pager [phone]

## 2019-04-10 NOTE — Progress Notes (Signed)
  Echocardiogram 2D Echocardiogram has been performed.  Florita Nitsch L Androw 04/10/2019, 9:36 AM

## 2019-04-10 NOTE — H&P (View-Only) (Signed)
Consultation  Referring Provider: Dr. Harrington Challenger    Primary Care Physician:  Lillard Anes, MD Primary Gastroenterologist: Althia Forts       Reason for Consultation: IDA             HPI:   [name] is a 84 y.o. male with a past medical history of nonobstructive CAD, chronic combined CHF (echo 04/10/2019 EF 60-65 % with mild aortic valve stenosis), s/p Pacemaker, paroxysmal A. fib on Eliquis, PVD, CKD stage III and reflux, who presented to the hospital on 04/09/2019 with shortness of breath, we are consulted in regards to an iron deficiency anemia discovered at admission.    Today, the patient explains that over the past 2 weeks he had noticed increased shortness of breath with exertion and some chest pressure.  Last week it was so bad that if he got up and would walk several steps, then he was short of breath and had to stop and sit down.  Associated symptoms include lightheadedness and chest tightness.    Last dose of Eliquis was the morning of 04/09/2019.  His heparin was discontinued this morning around 8 AM.    History of previous colonoscopy but patient tells me he was so long ago he does not know where it was done or who did it.    Denies any GI complaints including melena, hematochezia, fever, chills, weight loss, change in bowel habits, heartburn, reflux, nausea, vomiting or abdominal pain.  ER work-up: Hemoglobin 7.6, troponin elevated at 7  Past Medical History:  Diagnosis Date   Arthritis    Asthma    Coronary artery disease    GERD (gastroesophageal reflux disease)    Hypertension    Peripheral vascular disease ([place])     Past Surgical History:  Procedure Laterality Date   CATARACT EXTRACTION     HIP SURGERY     INSERT / REPLACE / REMOVE PACEMAKER     [employer]   JOINT REPLACEMENT Right 10/20/1994   Hip  (Left Hip in 10/96)   LEFT HEART CATH AND CORONARY ANGIOGRAPHY N/A 02/08/2017   Procedure: LEFT HEART CATH AND CORONARY ANGIOGRAPHY;  Surgeon:  Martinique, Peter M, MD;  Location: [facility];  Service: Cardiovascular;  Laterality: N/A;   ORIF PERIPROSTHETIC FRACTURE Right 08/17/2017   Procedure: OPEN REDUCTION INTERNAL FIXATION (ORIF) PERIPROSTHETIC FRACTURE RIGHT FEMUR;  Surgeon: Paralee Cancel, MD;  Location: [facility];  Service: Orthopedics;  Laterality: Right;   ORIF PERIPROSTHETIC FRACTURE Right 01/23/2018   Procedure: Redo open reduction internal fixation right periprosthetic femur fracture;  Surgeon: Paralee Cancel, MD;  Location: [facility];  Service: Orthopedics;  Laterality: Right;  120 mins    Family History  Problem Relation Age of Onset   Heart attack Mother      Social History   Tobacco Use   Smoking status: Never Smoker   Smokeless tobacco: Never Used  Substance Use Topics   Alcohol use: Yes    Alcohol/week: 1.0 standard drinks    Types: 1 Cans of beer per week    Comment: occ   Drug use: No    Prior to Admission medications   Medication Sig Start Date End Date Taking? Authorizing Provider  albuterol (PROVENTIL HFA;VENTOLIN HFA) 108 (90 Base) MCG/ACT inhaler Inhale 2 puffs into the lungs 4 (four) times daily as needed for wheezing or shortness of breath.   Yes [provider]  amLODipine (NORVASC) 5 MG tablet TAKE 1 TABLET(5 MG) BY MOUTH DAILY Patient  taking differently: Take 5 mg by mouth at bedtime.  03/27/19  Yes Park Liter, MD  apixaban (ELIQUIS) 5 MG TABS tablet Take 1 tablet (5 mg total) by mouth 2 (two) times daily. *PLEASE SCHEDULE APPOINTMENT FOR FURTHER REFILLS* 03/13/19  Yes Park Liter, MD  atorvastatin (LIPITOR) 40 MG tablet Take 40 mg by mouth at bedtime.    Yes [provider]  Calcium Carbonate-Vitamin D (CALCIUM 500 + D PO) Take 1 tablet by mouth daily.   Yes [provider]  chlorthalidone (HYGROTON) 25 MG tablet Take 1 tablet (25 mg total) by mouth daily. 02/09/17  Yes Cheryln Manly, NP  fluticasone (FLOVENT HFA) 110 MCG/ACT inhaler Inhale 2  puffs into the lungs daily.    Yes [provider]  HYDROcodone-acetaminophen (NORCO/VICODIN) 5-325 MG tablet Take 1 tablet by mouth 2 (two) times daily as needed (pain).  03/26/19  Yes [provider]  isosorbide mononitrate (IMDUR) 60 MG 24 hr tablet Take 60 mg by mouth at bedtime.    Yes [provider]  levothyroxine (SYNTHROID, LEVOTHROID) 50 MCG tablet Take 50 mcg by mouth at bedtime.    Yes [provider]  Multiple Vitamin (MULTIVITAMIN WITH MINERALS) TABS tablet Take 1 tablet by mouth daily.   Yes [provider]  nitroGLYCERIN (NITROSTAT) 0.4 MG SL tablet DISSOLVE 1 TABLET UNDER THE TONGUE EVERY 5 MINUTES AS NEEDED FOR CHEST PAIN. MAX 3 DOSES IN 15 MINUTES Patient taking differently: Place 0.4 mg under the tongue every 5 (five) minutes as needed for chest pain (max 3 doses in 15 minutes).  11/06/18  Yes Park Liter, MD  omeprazole (PRILOSEC OTC) 20 MG tablet Take 2 tablets (40 mg total) by mouth daily. Patient taking differently: Take 20 mg by mouth every morning.  08/20/17  Yes Hosie Poisson, MD  ranolazine (RANEXA) 500 MG 12 hr tablet TAKE 1 TABLET(500 MG) BY MOUTH TWICE DAILY Patient taking differently: Take 500 mg by mouth 2 (two) times daily.  01/16/19  Yes Park Liter, MD  vitamin B-12 (CYANOCOBALAMIN) 1000 MCG tablet Take 1,000 mcg by mouth daily.    Yes [provider]    Current Facility-Administered Medications  Medication Dose Route Frequency Provider Last Rate Last Dose   0.9 %  sodium chloride infusion (Manually program via Guardrails IV Fluids)   Intravenous Once Kroeger, Krista M., PA-C       0.9 %  sodium chloride infusion   Intravenous Continuous Kroeger, Krista M., PA-C       acetaminophen (TYLENOL) tablet 650 mg  650 mg Oral Q4H PRN Roby Lofts M., PA-C   650 mg at 04/10/19 [id number]   albuterol (PROVENTIL) (2.5 MG/3ML) 0.083% nebulizer solution 2.5 mg  2.5 mg Nebulization Q4H PRN Fay Records, MD        amLODipine (NORVASC) tablet 5 mg  5 mg Oral QHS Kroeger, Krista M., PA-C   5 mg at 04/09/19 2135   atorvastatin (LIPITOR) tablet 40 mg  40 mg Oral QHS Roby Lofts M., PA-C   40 mg at 04/09/19 2135   budesonide (PULMICORT) nebulizer solution 0.25 mg  0.25 mg Nebulization BID Fay Records, MD   0.25 mg at 04/10/19 0740   ferumoxytol [facility]) 510 mg in sodium chloride 0.9 % 100 mL IVPB  510 mg Intravenous Once Kroeger, Krista M., PA-C       ipratropium (ATROVENT HFA) inhaler 2 puff  2 puff Inhalation Once Abigail Butts., PA-C  isosorbide mononitrate (IMDUR) 24 hr tablet 60 mg  60 mg Oral QHS Roby Lofts M., PA-C   60 mg at 04/09/19 2135   levothyroxine (SYNTHROID) tablet 50 mcg  50 mcg Oral [id number] Abigail Butts., PA-C   50 mcg at 04/10/19 [id number]   nitroGLYCERIN (NITROSTAT) SL tablet 0.4 mg  0.4 mg Sublingual Q5 min PRN Tommye Standard, Daleen Snook M., PA-C       ondansetron [facility]) injection 4 mg  4 mg Intravenous Q6H PRN Tommye Standard, Krista M., PA-C       pantoprazole (PROTONIX) EC tablet 40 mg  40 mg Oral Daily Fay Records, MD       ranolazine (RANEXA) 12 hr tablet 500 mg  500 mg Oral BID Kroeger, Krista M., PA-C   500 mg at 04/09/19 2135   vitamin B-12 (CYANOCOBALAMIN) tablet 1,000 mcg  1,000 mcg Oral Daily Kroeger, Krista M., PA-C   1,000 mcg at 04/09/19 2149    Allergies as of 04/09/2019   (No Known Allergies)     Review of Systems:    Constitutional: No weight loss, fever or chills Skin: No rash  Cardiovascular: No chest pain Respiratory: +DOE Gastrointestinal: See HPI and otherwise negative Genitourinary: No dysuria  Neurological: No headache, dizziness or syncope Musculoskeletal: No new muscle or joint pain Hematologic: No bleeding  Psychiatric: No history of depression or anxiety    Physical Exam:  Vital signs in last 24 hours: Temp:  [97.7 F (36.5 C)-98.3 F (36.8 C)] 98.2 F (36.8 C) (10/20 0749) Pulse Rate:  [60-72] 62 (10/20 0749) Resp:  [12-20]  20 (10/20 0749) BP: (124-195)/(45-79) 129/45 (10/20 0749) SpO2:  [95 %-100 %] 95 % (10/20 0749) Weight:  [81.6 kg-87.1 kg] 81.6 kg (10/20 0429)   General:   Pleasant Caucasian male appears to be in NAD, Well developed, Well nourished, alert and cooperative Head:  Normocephalic and atraumatic. Eyes:   PEERL, EOMI. No icterus. Conjunctiva pink. Ears:  Normal auditory acuity. Neck:  Supple Throat: Oral cavity and pharynx without inflammation, swelling or lesion.  Lungs: Respirations even and unlabored. Lungs clear to auscultation bilaterally.   No wheezes, crackles, or rhonchi.  Heart: Normal S1, S2. +murmur Regular rate and rhythm. No peripheral edema, cyanosis or pallor.  Abdomen:  Soft, nondistended, nontender. No rebound or guarding. Normal bowel sounds. No appreciable masses or hepatomegaly. Rectal:  Not performed.  Msk:  Symmetrical without gross deformities. Peripheral pulses intact.  Extremities:  Without edema, no deformity or joint abnormality. Normal ROM, normal sensation. Neurologic:  Alert and  oriented x4;  grossly normal neurologically.  Skin:   Dry and intact without significant lesions or rashes. Psychiatric: Demonstrates good judgement and reason without abnormal affect or behaviors.   LAB RESULTS: Recent Labs    04/09/19 1453 04/09/19 2105 04/10/19 0448  WBC 5.0 5.1 4.8  HGB 7.6* 8.0* 7.4*  HCT 24.8* 25.3* 23.7*  PLT 253 277 264   BMET Recent Labs    04/09/19 1453 04/10/19 0448  NA 137 139  K 4.4 4.0  CL 106 104  CO2 22 25  GLUCOSE 97 87  BUN 14 13  CREATININE 1.43* 1.47*  CALCIUM 8.8* 9.2   LFT Recent Labs    04/09/19 1453  PROT 6.4*  ALBUMIN 3.4*  AST 18  ALT 12  ALKPHOS 64  BILITOT 0.4   STUDIES: Dg Chest Port 1 View  Result Date: 04/09/2019 CLINICAL DATA:  Shortness of breath. EXAM: PORTABLE CHEST 1 VIEW COMPARISON:  August 15, 2017 FINDINGS:  Bilateral layering effusions with underlying opacities, possibly atelectasis. Stable  cardiomegaly. Stable pacemaker. The hila and mediastinum are unchanged. No pneumothorax. IMPRESSION: New bilateral small layering effusions with underlying opacities, likely atelectasis. Electronically Signed   By: Dorise Bullion III M.D   On: 04/09/2019 14:39    Impression / Plan:   Impression: 1.  IDA: Hemoglobin 7.4, iron studies consistent with iron deficiency, 1 unit PRBCs has been ordered as well as guaiac studies and IV iron x1; Consider GI source of blood loss vs iron malabsorption vs other 2.  Chronic anticoagulation for A. fib: Was on Eliquis which has been on hold since yesterday morning 04/09/2019, heparin now off per nursing staff  Plan: 1.  Discussed EGD and Colonoscopy with the patient today.  He agrees to proceed.  These will be scheduled tomorrow with Dr. Ardis Hughs.  Did discuss risks, benefits, limitations and alternatives. 2.  Continue to hold heparin and Eliquis 3.  Patient will be on clears for the rest of today, n.p.o. after midnight 4.  Ordered movi prep 5.  Agree with blood transfusion and iron infusion. Hgb will need to be above 7 for procedures tomorrow. 6.  Please await any further recommendations from Dr. Ardis Hughs later today.  COVID NEGATIVE 04/09/19  Thank you for your kind consultation, we will continue to follow.  Lavone Nian [facility]  04/10/2019, 10:48 AM  ________________________________________________________________________  Velora Heckler GI MD note:  I personally examined the patient, reviewed the data and agree with the assessment and plan described above.  Signficant, symptomatic IDA in patient on eliquis for afib.  No overt bleeding of any kind.  Colonoscopy many many years ago.  He understands that I recommend colonoscopy and EGD as workup. He is not very enthused about the idea and I won't be surprised if he declines to take the colonoscopy prep when it is offered later this afternoon.   Owens Loffler, MD [facility] Pager [phone]

## 2019-04-10 NOTE — Progress Notes (Addendum)
Progress Note  Patient Name: [name] Date of Encounter: 04/10/2019  Primary Cardiologist: Jenne Campus, MD   Subjective   No CP at rest  No SOB at rest   Getting up and walking is when symptoms begin  Inpatient Medications    Scheduled Meds: . sodium chloride   Intravenous Once  . amLODipine  5 mg Oral QHS  . atorvastatin  40 mg Oral QHS  . budesonide  0.25 mg Nebulization BID  . ipratropium  2 puff Inhalation Once  . isosorbide mononitrate  60 mg Oral QHS  . levothyroxine  50 mcg Oral Q0600  . pantoprazole  40 mg Oral Daily  . ranolazine  500 mg Oral BID  . vitamin B-12  1,000 mcg Oral Daily   Continuous Infusions: . sodium chloride    . ferumoxytol     PRN Meds: acetaminophen, albuterol, nitroGLYCERIN, ondansetron (ZOFRAN) IV   Vital Signs    Vitals:   04/09/19 2349 04/10/19 0429 04/10/19 0742 04/10/19 0749  BP: (!) 134/56 (!) 124/45  (!) 129/45  Pulse:  71 66 62  Resp: 16 16 18 20   Temp: 98.3 F (36.8 C) 97.7 F (36.5 C)  98.2 F (36.8 C)  TempSrc: Oral Oral  Oral  SpO2:  95% 96% 95%  Weight:  81.6 kg    Height:        Intake/Output Summary (Last 24 hours) at 04/10/2019 0802 Last data filed at 04/10/2019 0430 Gross per 24 hour  Intake 167.46 ml  Output 3450 ml  Net -3282.54 ml   Last 3 Weights 04/10/2019 04/09/2019 04/09/2019  Weight (lbs) 180 lb 186 lb 192 lb  Weight (kg) 81.647 kg 84.369 kg 87.091 kg      Telemetry     SR- Personally Reviewed  ECG    Physical Exam  Pt in NAD   Laying flat   GEN: No acute distress.   Neck: No JVD Cardiac: RRR, no murmurs, rubs, or gallops.  Respiratory: Clear to auscultation bilaterally. GI: Soft, nontender, non-distended  MS: No edema; No deformity. Neuro:  Nonfocal  Psych: Normal affect   Labs    High Sensitivity Troponin:   Recent Labs  Lab 04/09/19 1453 04/09/19 1728  TROPONINIHS 7 9      Chemistry Recent Labs  Lab 04/09/19 1453 04/10/19 0448  NA 137 139  K 4.4 4.0   CL 106 104  CO2 22 25  GLUCOSE 97 87  BUN 14 13  CREATININE 1.43* 1.47*  CALCIUM 8.8* 9.2  PROT 6.4*  --   ALBUMIN 3.4*  --   AST 18  --   ALT 12  --   ALKPHOS 64  --   BILITOT 0.4  --   GFRNONAA 45* 44*  GFRAA 52* 51*  ANIONGAP 9 10     Hematology Recent Labs  Lab 04/09/19 1453 04/09/19 1728 04/09/19 2105 04/10/19 0448  WBC 5.0  --  5.1 4.8  RBC 3.19* 3.41* 3.35* 3.14*  HGB 7.6*  --  8.0* 7.4*  HCT 24.8*  --  25.3* 23.7*  MCV 77.7*  --  75.5* 75.5*  MCH 23.8*  --  23.9* 23.6*  MCHC 30.6  --  31.6 31.2  RDW 16.7*  --  16.6* 16.5*  PLT 253  --  277 264    BNP Recent Labs  Lab 04/09/19 1453  BNP 286.1*     DDimer No results for input(s): DDIMER in the last 168 hours.   Radiology  Dg Chest Port 1 View  Result Date: 04/09/2019 CLINICAL DATA:  Shortness of breath. EXAM: PORTABLE CHEST 1 VIEW COMPARISON:  August 15, 2017 FINDINGS: Bilateral layering effusions with underlying opacities, possibly atelectasis. Stable cardiomegaly. Stable pacemaker. The hila and mediastinum are unchanged. No pneumothorax. IMPRESSION: New bilateral small layering effusions with underlying opacities, likely atelectasis. Electronically Signed   By: Dorise Bullion III M.D   On: 04/09/2019 14:39    Cardiac Studies     Patient Profile   [name] is a 84 y.o. male with PMH of non-obstructive CAD, chronic combined CHF, paroxysmal atrial fibrillation on eliquis, CHB s/p PPM, PVD, carotid artery disease with occluded R ICA, HTN, HLD, CVA, hypothyroidism, asthma, CKD stage 3, and GERD, who presented with SOB.    Assessment & Plan    1  Anemia  Hgb 7.4  Today    Fe studies consistent with Fe defic   Plan to tx 1 U prbc with Fe infusion    Watch volume   Guaiac ordered   Will ask GI to see  Pt denies bleeding   Has had colonoscopy in past   Cant remember when   2   CP/SOB  Pt remains asymtoamtic at rest     Trop negative   Echo pending   Hold on invasive Rx for now given  anemia  3.  Atrial fibrillation    Pt had been on Eliquis   This is held for right now   Needs eval given Hx of CVA in past    4  CHF   Volume status is better than yesterday   Pt diuresed 3.2 L   Cr stable   Hold further lsix for now    Echo pending    5  HTN  BP is OK     6  HL  LDL is well controlled in 24s    6  CV dz    Signif CV dz   Hx of CVA in past     7  Hx CHB  S/p PPM       For questions or updates, please contact [facility] Please consult [email or web address] for contact info under        Signed, Dorris Carnes, MD  04/10/2019, 8:02 AM

## 2019-04-11 ENCOUNTER — Inpatient Hospital Stay (HOSPITAL_COMMUNITY): Payer: PPO

## 2019-04-11 ENCOUNTER — Inpatient Hospital Stay (HOSPITAL_COMMUNITY): Payer: PPO | Admitting: Anesthesiology

## 2019-04-11 ENCOUNTER — Encounter (HOSPITAL_COMMUNITY)
Admission: EM | Disposition: A | Discharge status: Home / Self Care | Payer: Self-pay | Source: Ambulatory Visit | Attending: Internal Medicine

## 2019-04-11 ENCOUNTER — Encounter (HOSPITAL_COMMUNITY): Payer: Self-pay | Admitting: Gastroenterology

## 2019-04-11 DIAGNOSIS — K2990 Gastroduodenitis, unspecified, without bleeding: Secondary | ICD-10-CM

## 2019-04-11 DIAGNOSIS — K635 Polyp of colon: Secondary | ICD-10-CM

## 2019-04-11 DIAGNOSIS — K2970 Gastritis, unspecified, without bleeding: Secondary | ICD-10-CM

## 2019-04-11 DIAGNOSIS — D509 Iron deficiency anemia, unspecified: Secondary | ICD-10-CM

## 2019-04-11 HISTORY — PX: COLONOSCOPY WITH PROPOFOL: SHX5780

## 2019-04-11 HISTORY — PX: POLYPECTOMY: SHX5525

## 2019-04-11 HISTORY — PX: BIOPSY: SHX5522

## 2019-04-11 HISTORY — PX: ESOPHAGOGASTRODUODENOSCOPY (EGD) WITH PROPOFOL: SHX5813

## 2019-04-11 LAB — CBC
HCT: 28.5 % — ABNORMAL LOW (ref 39.0–52.0)
Hemoglobin: 9.3 g/dL — ABNORMAL LOW (ref 13.0–17.0)
MCH: 25.1 pg — ABNORMAL LOW (ref 26.0–34.0)
MCHC: 32.6 g/dL (ref 30.0–36.0)
MCV: 76.8 fL — ABNORMAL LOW (ref 80.0–100.0)
Platelets: 265 10*3/uL (ref 150–400)
RBC: 3.71 MIL/uL — ABNORMAL LOW (ref 4.22–5.81)
RDW: 16.9 % — ABNORMAL HIGH (ref 11.5–15.5)
WBC: 5.8 10*3/uL (ref 4.0–10.5)
nRBC: 0 % (ref 0.0–0.2)

## 2019-04-11 LAB — TYPE AND SCREEN
ABO/RH(D): O NEG
Antibody Screen: NEGATIVE
Unit division: 0

## 2019-04-11 LAB — BPAM RBC
Blood Product Expiration Date: 202010252359
ISSUE DATE / TIME: 202010201225
Unit Type and Rh: 9500

## 2019-04-11 SURGERY — ESOPHAGOGASTRODUODENOSCOPY (EGD) WITH PROPOFOL
Anesthesia: Monitor Anesthesia Care

## 2019-04-11 MED ORDER — IOHEXOL 300 MG/ML  SOLN
80.0000 mL | Freq: Once | INTRAMUSCULAR | Status: AC | PRN
  Administered 2019-04-11: 80 mL via INTRAVENOUS

## 2019-04-11 MED ORDER — MAGNESIUM SULFATE 2 GM/50ML IV SOLN
2.0000 g | Freq: Once | INTRAVENOUS | Status: AC
  Administered 2019-04-11: 2 g via INTRAVENOUS
  Filled 2019-04-11: qty 50

## 2019-04-11 MED ORDER — PROPOFOL 500 MG/50ML IV EMUL
INTRAVENOUS | Status: DC | PRN
  Administered 2019-04-11: 50 ug/kg/min via INTRAVENOUS

## 2019-04-11 MED ORDER — SODIUM CHLORIDE 0.9 % IV SOLN
INTRAVENOUS | Status: DC
  Administered 2019-04-11: 06:00:00 via INTRAVENOUS

## 2019-04-11 MED ORDER — PROPOFOL 10 MG/ML IV BOLUS
INTRAVENOUS | Status: DC | PRN
  Administered 2019-04-11 (×2): 10 mg via INTRAVENOUS
  Administered 2019-04-11: 15 mg via INTRAVENOUS
  Administered 2019-04-11: 10 mg via INTRAVENOUS
  Administered 2019-04-11: 15 mg via INTRAVENOUS

## 2019-04-11 MED ORDER — HYDROCODONE-ACETAMINOPHEN 5-325 MG PO TABS
1.0000 | ORAL_TABLET | Freq: Two times a day (BID) | ORAL | Status: DC | PRN
  Administered 2019-04-11 – 2019-04-12 (×2): 1 via ORAL
  Filled 2019-04-11 (×2): qty 1

## 2019-04-11 MED ORDER — PHENYLEPHRINE HCL (PRESSORS) 10 MG/ML IV SOLN
INTRAVENOUS | Status: DC | PRN
  Administered 2019-04-11: 60 ug via INTRAVENOUS
  Administered 2019-04-11: 80 ug via INTRAVENOUS
  Administered 2019-04-11: 40 ug via INTRAVENOUS
  Administered 2019-04-11: 80 ug via INTRAVENOUS

## 2019-04-11 MED ORDER — DEXMEDETOMIDINE HCL 200 MCG/2ML IV SOLN
INTRAVENOUS | Status: DC | PRN
  Administered 2019-04-11 (×3): 8 ug via INTRAVENOUS

## 2019-04-11 MED ORDER — FUROSEMIDE 10 MG/ML IJ SOLN
60.0000 mg | Freq: Once | INTRAMUSCULAR | Status: AC
  Administered 2019-04-11: 60 mg via INTRAVENOUS
  Filled 2019-04-11: qty 6

## 2019-04-11 MED ORDER — LIDOCAINE HCL (CARDIAC) PF 100 MG/5ML IV SOSY
PREFILLED_SYRINGE | INTRAVENOUS | Status: DC | PRN
  Administered 2019-04-11: 60 mg via INTRAVENOUS

## 2019-04-11 SURGICAL SUPPLY — 25 items

## 2019-04-11 NOTE — Op Note (Signed)
[facility] Patient Name: [name] Procedure Date : 04/11/2019 MRN: [id number] Attending MD: Milus Banister , MD Date of Birth: 1934-08-18 CSN: [id number] Age: 84 Admit Type: Inpatient Procedure:                Upper GI endoscopy Indications:              Iron deficiency anemia Providers:                Milus Banister, MD, Jeanella Cara, RN,                            Marguerita Merles, Technician, Lazaro Arms, Technician Referring MD:              Medicines:                Monitored Anesthesia Care Complications:            No immediate complications. Estimated blood loss:                            None. Estimated Blood Loss:     Estimated blood loss: none. Procedure:                Pre-Anesthesia Assessment:                           - Prior to the procedure, a History and Physical                            was performed, and patient medications and                            allergies were reviewed. The patient's tolerance of                            previous anesthesia was also reviewed. The risks                            and benefits of the procedure and the sedation                            options and risks were discussed with the patient.                            All questions were answered, and informed consent                            was obtained. Prior Anticoagulants: The patient has                            taken Eliquis (apixaban), last dose was 2 days                            prior to procedure. ASA Grade Assessment: III - A  patient with severe systemic disease. After                            reviewing the risks and benefits, the patient was                            deemed in satisfactory condition to undergo the                            procedure.                           After obtaining informed consent, the endoscope was                            passed under direct vision. Throughout  the                            procedure, the patient's blood pressure, pulse, and                            oxygen saturations were monitored continuously. The                            [id number]) Olympus gastroscope was                            introduced through the mouth, and advanced to the                            second part of duodenum. The upper GI endoscopy was                            accomplished without difficulty. The patient                            tolerated the procedure well. Scope In: Scope Out: Findings:      Medium to large hiatal hernia without obvious Cameron's type erosions.      Minimal inflammation characterized by erythema, friability and       granularity was found in the entire examined stomach. Biopsies were       taken with a cold forceps for histology.      Soft, sessile polyp along lateral wall of the second duodenum (opposite       wall from the major papilla), this measures 2.5cm across. Biopsies taken       with foceps.      The exam was otherwise without abnormality. No recent or old blood in       the UGI tract. Impression:               - Medium sized hiatal hernia without Cameron's                            erosions.                           -  Gastritis, biopsied to check for H. pylori.                           - 2.5cm duodenal polyp, probably an adenomatous                            polyp. Biopsies taken. Pending further testing,                            this will need to be considered for elective                            endoscopic removal (polypectomy, EMR).                           - It is not clear if any of the above findings                            account for his IDA (admitting Hb 7.6, low                            ferritin). I will order CT scan abd/pelvis to                            continue the workup. If no obvious causes found he                            is safe to be discharged home after iron  infusion.                            He can restart his blood thinner tomorrow. Recommendation:           - Await final pathology results.                           - Will order CT scan abd/pelvis, allow him to eat.                            OK to resume blood thinner tomorrow. Procedure Code(s):        --- Professional ---                           [phone], Esophagogastroduodenoscopy, flexible,                            transoral; with biopsy, single or multiple Diagnosis Code(s):        --- Professional ---                           K29.70, Gastritis, unspecified, without bleeding                           D50.9, Iron deficiency anemia, unspecified CPT copyright 2019 [employer]. All rights reserved. The codes documented in this  report are preliminary and upon coder review may  be revised to meet current compliance requirements. Milus Banister, MD 04/11/2019 2:48:13 PM This report has been signed electronically. Number of Addenda: 0

## 2019-04-11 NOTE — Progress Notes (Signed)
Progress Note  Patient Name: [name] Date of Encounter: 04/11/2019  Primary Cardiologist: Scott Campus, MD   Subjective   Breathing is OK in bed   No CP   Inpatient Medications    Scheduled Meds: . amLODipine  5 mg Oral QHS  . atorvastatin  40 mg Oral QHS  . budesonide  0.25 mg Nebulization BID  . ipratropium  2 puff Inhalation Once  . isosorbide mononitrate  60 mg Oral QHS  . levothyroxine  50 mcg Oral Q0600  . pantoprazole  40 mg Oral Daily  . ranolazine  500 mg Oral BID  . vitamin B-12  1,000 mcg Oral Daily   Continuous Infusions: . sodium chloride    . sodium chloride 20 mL/hr at 04/11/19 0556   PRN Meds: acetaminophen, albuterol, nitroGLYCERIN, ondansetron (ZOFRAN) IV   Vital Signs    Vitals:   04/11/19 0115 04/11/19 0536 04/11/19 0724 04/11/19 0802  BP: (!) 144/68 (!) 145/47  (!) 160/53  Pulse: 82 67  66  Resp: 20 20  18   Temp:    98.7 F (37.1 C)  TempSrc:    Oral  SpO2: 98% (!) 86% 94% 99%  Weight:  82.4 kg    Height:        Intake/Output Summary (Last 24 hours) at 04/11/2019 0837 Last data filed at 04/10/2019 1850 Gross per 24 hour  Intake 1690 ml  Output 550 ml  Net 1140 ml   Net neg 2.1L    Last 3 Weights 04/11/2019 04/10/2019 04/09/2019  Weight (lbs) 181 lb 10.5 oz 180 lb 186 lb  Weight (kg) 82.4 kg 81.647 kg 84.369 kg      Telemetry    SR  ECG   A Sensed  V Pace  Physical Exam  Pt in NAD   Laying flat   GEN: No acute distress.   Neck: Difficult to assess JVP   Cardiac: RRR, no murmurs, rubs, or gallops.  Respiratory: With forced exp mild wheezing thoughout GI: Soft, nontender, non-distended  MS: No edema; No deformity. Neuro:  Nonfocal  Psych: Normal affect   Labs    High Sensitivity Troponin:   Recent Labs  Lab 04/09/19 1453 04/09/19 1728  TROPONINIHS 7 9      Chemistry Recent Labs  Lab 04/09/19 1453 04/10/19 0448  NA 137 139  K 4.4 4.0  CL 106 104  CO2 22 25  GLUCOSE 97 87  BUN 14 13   CREATININE 1.43* 1.47*  CALCIUM 8.8* 9.2  PROT 6.4*  --   ALBUMIN 3.4*  --   AST 18  --   ALT 12  --   ALKPHOS 64  --   BILITOT 0.4  --   GFRNONAA 45* 44*  GFRAA 52* 51*  ANIONGAP 9 10     Hematology Recent Labs  Lab 04/09/19 2105 04/10/19 0448 04/10/19 1955 04/11/19 0459  WBC 5.1 4.8  --  5.8  RBC 3.35* 3.14*  --  3.71*  HGB 8.0* 7.4* 10.0* 9.3*  HCT 25.3* 23.7* 30.5* 28.5*  MCV 75.5* 75.5*  --  76.8*  MCH 23.9* 23.6*  --  25.1*  MCHC 31.6 31.2  --  32.6  RDW 16.6* 16.5*  --  16.9*  PLT 277 264  --  265    BNP Recent Labs  Lab 04/09/19 1453  BNP 286.1*     DDimer No results for input(s): DDIMER in the last 168 hours.   Radiology    Dg Chest  Port 1 View  Result Date: 04/09/2019 CLINICAL DATA:  Shortness of breath. EXAM: PORTABLE CHEST 1 VIEW COMPARISON:  August 15, 2017 FINDINGS: Bilateral layering effusions with underlying opacities, possibly atelectasis. Stable cardiomegaly. Stable pacemaker. The hila and mediastinum are unchanged. No pneumothorax. IMPRESSION: New bilateral small layering effusions with underlying opacities, likely atelectasis. Electronically Signed   By: [name] M.D   On: 04/09/2019 14:39    Cardiac Studies   Echo:    1. Left ventricular ejection fraction, by visual estimation, is 60 to 65%. The left ventricle has normal function. Normal left ventricular size. Left ventricular septal wall thickness was mildly increased. Mildly increased left ventricular posterior  wall thickness.  2. Global right ventricle has normal systolic function.The right ventricular size is normal. No increase in right ventricular wall thickness.  3. A pacer wire is visualized.  4. Left atrial size was severely dilated.  5. Right atrial size was moderately dilated.  6. The mitral valve is normal in structure. Mild mitral valve regurgitation. No evidence of mitral stenosis.  7. The tricuspid valve is normal in structure. Tricuspid valve  regurgitation mild-moderate.  8. The aortic valve is tricuspid. There is Moderate thickening of the aortic valve. There is Moderate calcification of the aortic valve. Aortic valve regurgitation is mild to moderate by color flow Doppler. Mild aortic valve stenosis.  9. The pulmonic valve was normal in structure. Pulmonic valve regurgitation is not visualized by color flow Doppler. 10. Left ventricular diastolic Doppler parameters are consistent with pseudonormalization pattern of LV diastolic filling. 11. Elevated left atrial and left ventricular end-diastolic pressures.   Patient Profile   [name] is a 84 y.o. male with PMH of non-obstructive CAD, chronic combined CHF, paroxysmal atrial fibrillation on eliquis, CHB s/p PPM, PVD, carotid artery disease with occluded R ICA, HTN, HLD, CVA, hypothyroidism, asthma, CKD stage 3, and GERD, who presented with SOB.    Assessment & Plan    1  Anemia  Hgb 9.3  Today   Transfused 1 unit earlier this admit with Fe infusion    GI has seen pt  Plan endoscopy   3.  Atrial fibrillation  Remains in SR since admit   Asensed V Pace   Pt has had Hx of CVA before    Now with severe anemia Transfused  GI evaluation in progress     Anticoagulation on hold give symptomatic anemia   Felt that risk of anemia with symptoms greater than CVA   Will review with GI re heparin after endoscopy  4  CHF   Echo as noted above Normal   May reflect symptomatic anemia with CHF   Patient has diuresed 2.1 L total    But mild wheezing on exam.    Will give lasix x1  Follow      5  HTN  BP is labile    Peak 192    WIll consolidate meds after colonoscopy  6  HL  LDL is well controlled in 30s    6  CV dz    Signif cerebrovascular dz  With occluded R ICA    Hx of CVA in past    ANticoagulate when safe   COntinue risk factor optimization   7  Hx CHB  S/p PPM       For questions or updates, please contact [facility] Please consult [email or web address] for contact info  under        Signed, Dorris Carnes, MD  04/11/2019, 8:37  AM

## 2019-04-11 NOTE — Anesthesia Preprocedure Evaluation (Signed)
Anesthesia Evaluation  Patient identified by MRN, date of birth, ID band Patient awake    Reviewed: Allergy & Precautions, NPO status , Patient's Chart, lab work & pertinent test results  History of Anesthesia Complications Negative for: history of anesthetic complications  Airway Mallampati: II  TM Distance: >3 FB Neck ROM: Full    Dental  (+) Lower Dentures, Upper Dentures   Pulmonary asthma ,    Pulmonary exam normal        Cardiovascular hypertension, Pt. on medications + CAD, + Peripheral Vascular Disease and +CHF  Normal cardiovascular exam+ dysrhythmias (3rd degree AVB) + pacemaker   TTE 04/10/19: EF 60-65%, severe LAE, moderate RAE, mild MR, mild-mod TR, mild AS, mild-mod AR    Neuro/Psych negative neurological ROS  negative psych ROS   GI/Hepatic Neg liver ROS, GERD  Medicated and Controlled,  Endo/Other  Hypothyroidism   Renal/GU negative Renal ROS  negative genitourinary   Musculoskeletal  (+) Arthritis ,   Abdominal   Peds  Hematology negative hematology ROS (+)   Anesthesia Other Findings Day of surgery medications reviewed with patient.  Reproductive/Obstetrics negative OB ROS                             Anesthesia Physical Anesthesia Plan  ASA: III  Anesthesia Plan: MAC   Post-op Pain Management:    Induction:   PONV Risk Score and Plan: 1 and Treatment may vary due to age or medical condition and Propofol infusion  Airway Management Planned: Natural Airway and Nasal Cannula  Additional Equipment:   Intra-op Plan:   Post-operative Plan:   Informed Consent: I have reviewed the patients History and Physical, chart, labs and discussed the procedure including the risks, benefits and alternatives for the proposed anesthesia with the patient or authorized representative who has indicated his/her understanding and acceptance.     Dental advisory given  Plan  Discussed with: CRNA  Anesthesia Plan Comments:         Anesthesia Quick Evaluation

## 2019-04-11 NOTE — Interval H&P Note (Signed)
History and Physical Interval Note:  04/11/2019 1:16 PM  [name]  has presented today for surgery, with the diagnosis of IDA.  The various methods of treatment have been discussed with the patient and family. After consideration of risks, benefits and other options for treatment, the patient has consented to  Procedure(s): ESOPHAGOGASTRODUODENOSCOPY (EGD) WITH PROPOFOL (N/A) COLONOSCOPY WITH PROPOFOL (N/A) as a surgical intervention.  The patient's history has been reviewed, patient examined, no change in status, stable for surgery.  I have reviewed the patient's chart and labs.  Questions were answered to the patient's satisfaction.     Milus Banister

## 2019-04-11 NOTE — Transfer of Care (Signed)
Immediate Anesthesia Transfer of Care Note  Patient: PARRY PO  Procedure(s) Performed: ESOPHAGOGASTRODUODENOSCOPY (EGD) WITH PROPOFOL (N/A ) COLONOSCOPY WITH PROPOFOL (N/A ) POLYPECTOMY BIOPSY  Patient Location: Endoscopy Unit  Anesthesia Type:MAC  Level of Consciousness: awake, alert  and oriented  Airway & Oxygen Therapy: Patient Spontanous Breathing and Patient connected to nasal cannula oxygen  Post-op Assessment: Report given to RN, Post -op Vital signs reviewed and stable and Patient moving all extremities X 4  Post vital signs: Reviewed and stable  Last Vitals:  Vitals Value Taken Time  BP 97/44 04/11/19 1439  Temp 36.8 C 04/11/19 1439  Pulse 68 04/11/19 1442  Resp 15 04/11/19 1442  SpO2 97 % 04/11/19 1442  Vitals shown include unvalidated device data.  Last Pain:  Vitals:   04/11/19 1439  TempSrc: Oral  PainSc: 0-No pain      Patients Stated Pain Goal: 0 (46/50/35 4656)  Complications: No apparent anesthesia complications

## 2019-04-11 NOTE — Op Note (Addendum)
[facility] Patient Name: [name] Procedure Date : 04/11/2019 MRN: [id number] Attending MD: Milus Banister , MD Date of Birth: 06/03/1935 CSN: [id number] Age: 84 Admit Type: Inpatient Procedure:                Colonoscopy Indications:              Iron deficiency anemia, Hb 7.6 low MCV, low                            Ferttin, on eliquis for AFib Providers:                Milus Banister, MD, Jeanella Cara, RN,                            Lazaro Arms, Technician, Marguerita Merles, Technician Referring MD:              Medicines:                Monitored Anesthesia Care Complications:            No immediate complications. Estimated blood loss:                            None. Estimated Blood Loss:     Estimated blood loss: none. Procedure:                Pre-Anesthesia Assessment:                           - Prior to the procedure, a History and Physical                            was performed, and patient medications and                            allergies were reviewed. The patient's tolerance of                            previous anesthesia was also reviewed. The risks                            and benefits of the procedure and the sedation                            options and risks were discussed with the patient.                            All questions were answered, and informed consent                            was obtained. Prior Anticoagulants: The patient has                            taken Eliquis (apixaban), last dose was 2 days  prior to procedure. ASA Grade Assessment: III - A                            patient with severe systemic disease. After                            reviewing the risks and benefits, the patient was                            deemed in satisfactory condition to undergo the                            procedure.                           After obtaining informed consent, the colonoscope                             was passed under direct vision. Throughout the                            procedure, the patient's blood pressure, pulse, and                            oxygen saturations were monitored continuously. The                            [id number]) Olympus colonoscope was                            introduced through the anus and advanced to the the                            cecum, identified by appendiceal orifice and                            ileocecal valve. The colonoscopy was performed                            without difficulty. The patient tolerated the                            procedure well. The quality of the bowel                            preparation was good. The ileocecal valve,                            appendiceal orifice, and rectum were photographed. Scope In: 2:08:24 PM Scope Out: 2:18:40 PM Scope Withdrawal Time: 0 hours 5 minutes 17 seconds  Total Procedure Duration: 0 hours 10 minutes 16 seconds  Findings:      Two sessile polyps were found in the transverse colon and cecum. The       polyps were 5 to 9 mm in size.  These polyps were removed with a cold       snare. Resection and retrieval were complete.      The exam was otherwise without abnormality on direct and retroflexion       views. Impression:               - Two 5 to 9 mm polyps in the transverse colon and                            in the cecum, removed with a cold snare. Resected                            and retrieved.                           - The examination was otherwise normal on direct                            and retroflexion views. Recommendation:           - EGD now                           - Await final pathology from colon polyps, likely                            adenomatous and given age he will not need                            surveillance examination. Procedure Code(s):        --- Professional ---                           [phone],  Colonoscopy, flexible; with removal of                            tumor(s), polyp(s), or other lesion(s) by snare                            technique Diagnosis Code(s):        --- Professional ---                           K63.5, Polyp of colon                           D50.9, Iron deficiency anemia, unspecified CPT copyright 2019 [employer]. All rights reserved. The codes documented in this report are preliminary and upon coder review may  be revised to meet current compliance requirements. Milus Banister, MD 04/11/2019 2:23:17 PM This report has been signed electronically. Number of Addenda: 0

## 2019-04-11 NOTE — Progress Notes (Signed)
Paged Dr. Kalman Shan in regards to patient c/o pain 8/10

## 2019-04-11 NOTE — Anesthesia Postprocedure Evaluation (Signed)
Anesthesia Post Note  Patient: [name]  Procedure(s) Performed: ESOPHAGOGASTRODUODENOSCOPY (EGD) WITH PROPOFOL (N/A ) COLONOSCOPY WITH PROPOFOL (N/A ) POLYPECTOMY BIOPSY     Patient location during evaluation: PACU Anesthesia Type: MAC Level of consciousness: awake and alert and oriented Pain management: pain level controlled Vital Signs Assessment: post-procedure vital signs reviewed and stable Respiratory status: spontaneous breathing, nonlabored ventilation and respiratory function stable Cardiovascular status: blood pressure returned to baseline Postop Assessment: no apparent nausea or vomiting Anesthetic complications: no    Last Vitals:  Vitals:   04/11/19 1440 04/11/19 1450  BP: (!) 125/49 (!) 145/52  Pulse: 67 71  Resp: 14 13  Temp:    SpO2: 92% 96%    Last Pain:  Vitals:   04/11/19 1440  TempSrc:   PainSc: 0-No pain                 Brennan Bailey

## 2019-04-11 NOTE — Anesthesia Procedure Notes (Signed)
Procedure Name: MAC Date/Time: 04/11/2019 2:01 PM Performed by: Mariea Clonts, CRNA Pre-anesthesia Checklist: Patient identified, Emergency Drugs available, Suction available, Patient being monitored and Timeout performed Patient Re-evaluated:Patient Re-evaluated prior to induction Oxygen Delivery Method: Nasal cannula

## 2019-04-12 ENCOUNTER — Encounter (HOSPITAL_COMMUNITY): Payer: Self-pay | Admitting: Physician Assistant

## 2019-04-12 DIAGNOSIS — D649 Anemia, unspecified: Secondary | ICD-10-CM

## 2019-04-12 DIAGNOSIS — I5031 Acute diastolic (congestive) heart failure: Secondary | ICD-10-CM

## 2019-04-12 DIAGNOSIS — N183 Chronic kidney disease, stage 3 unspecified: Secondary | ICD-10-CM

## 2019-04-12 HISTORY — DX: Anemia, unspecified: D64.9

## 2019-04-12 HISTORY — DX: Acute diastolic (congestive) heart failure: I50.31

## 2019-04-12 LAB — CBC
HCT: 28.9 % — ABNORMAL LOW (ref 39.0–52.0)
Hemoglobin: 9.3 g/dL — ABNORMAL LOW (ref 13.0–17.0)
MCH: 24.7 pg — ABNORMAL LOW (ref 26.0–34.0)
MCHC: 32.2 g/dL (ref 30.0–36.0)
MCV: 76.9 fL — ABNORMAL LOW (ref 80.0–100.0)
Platelets: 275 10*3/uL (ref 150–400)
RBC: 3.76 MIL/uL — ABNORMAL LOW (ref 4.22–5.81)
RDW: 17.5 % — ABNORMAL HIGH (ref 11.5–15.5)
WBC: 5.1 10*3/uL (ref 4.0–10.5)
nRBC: 0 % (ref 0.0–0.2)

## 2019-04-12 LAB — SURGICAL PATHOLOGY

## 2019-04-12 MED ORDER — APIXABAN 5 MG PO TABS: 5.0000 mg | ORAL_TABLET | Freq: Two times a day (BID) | ORAL | 6 refills | Status: DC

## 2019-04-12 NOTE — Discharge Summary (Addendum)
Discharge Summary    Patient ID: [name] MRN: [id number]; DOB: August 05, 1934  Admit date: 04/09/2019 Discharge date: 04/12/2019  Primary Care Provider: Lillard Anes, MD  Primary Cardiologist: Jenne Campus, MD  Primary Electrophysiologist:  Will Meredith Leeds, MD   Discharge Diagnoses    Principal Problem:   Symptomatic anemia Active Problems:   Exertional angina [facility])   Paroxysmal atrial fibrillation ([place])   Coronary artery disease nonobstructive based on cardiac catheterization from 2018   Iron deficiency anemia   Gastritis and gastroduodenitis   Polyp of cecum   Acute diastolic CHF (congestive heart failure) ([place])   CKD (chronic kidney disease), stage III  Allergies No Known Allergies  Diagnostic Studies/Procedures    1. EGD and colonoscopy this admission, see full reports for details.  2. 2D echo 04/10/19 IMPRESSIONS  1. Left ventricular ejection fraction, by visual estimation, is 60 to 65%. The left ventricle has normal function. Normal left ventricular size. Left ventricular septal wall thickness was mildly increased. Mildly increased left ventricular posterior  wall thickness.  2. Global right ventricle has normal systolic function.The right ventricular size is normal. No increase in right ventricular wall thickness.  3. A pacer wire is visualized.  4. Left atrial size was severely dilated.  5. Right atrial size was moderately dilated.  6. The mitral valve is normal in structure. Mild mitral valve regurgitation. No evidence of mitral stenosis.  7. The tricuspid valve is normal in structure. Tricuspid valve regurgitation mild-moderate.  8. The aortic valve is tricuspid. There is Moderate thickening of the aortic valve. There is Moderate calcification of the aortic valve. Aortic valve regurgitation is mild to moderate by color flow Doppler. Mild aortic valve stenosis.  9. The pulmonic valve was normal in structure. Pulmonic valve regurgitation  is not visualized by color flow Doppler. 10. Left ventricular diastolic Doppler parameters are consistent with pseudonormalization pattern of LV diastolic filling. 11. Elevated left atrial and left ventricular end-diastolic pressures.  _____________   History of Present Illness     [name] is an 84 y.o. male with history of moderate nonobstructive CAD by cath in 2018, paroxysmal atrial fibrillation on Eliquis, CHB s/p PPM (followed by Dr. Curt Bears), chronic anemia, PVD with carotid artery disease with occluded R ICA, HTN, HLD, CVA, hypothyroidism, asthma,CKD stage 3,and GERD who presented to [facility] upon request from the office for exertional angina and dyspnea.  The patient has cardiac history as outlined above with prior cath in 2018 showing moderate CAD with 60% immediate RCA, 40% proximal LAD with 50% mid LAD, 60% PDA. He presented to the office 04/09/19 with severe exertional chest pain and dyspnea. He was also quite pale so sent to the hospital for admission. He was found to have worsening anemia with Hgb of 7.6 (decreased from 01/2018 when it was 9.3). hsTroponins were negative. He was also felt to have a component of acute diastolic CHF in context of the anemia. Eliquis was held and he was changed to heparin per pharmacy in anticipation of probable procedural evaluation. Covid-19 testing was negative.  Hospital Course     1. Symptomatic acute on chronic anemia with iron deficiency - anemia panel demonstrated iron deficiency. He was transfused 1 unit PRBCs. GI evaluated him and performed EGD this admission with gastritis and duodenal polyp. They also did colonoscopy with 2 polyps in transverse colon. It was not clear that these findings clearly explained degree of anemia. CT abd/pelvis showed no evidence of neoplasm; +  large hiatal hernia, + cholelithiasis, no cholecystitis, small right pleural effusion. Patient is also noted to have evidence of CKD so question if this was  contributing. He received IV feraheme on 10/20. Post transfusion Hgb has remained stable at 9.3 for 2 days. Per discussion with Dr. Harrington Challenger who reviewed GI recommendations from today, will resume Eliquis in 3 days (the evening of 10/25), delayed to reduce risk of post polypectomy bleeding. He has been in NSR this admission. Will need close attention in follow-up to his CBC and kidney function upon follow-up. His creatinine has traditionally been right below the border of needing to switch to lower dose of Eliquis. Dr. Harrington Challenger recommends to maintain him on his 5mg  BID dose given that he currently meets criteria for this, but he will need to be followed closely as outpatient. I have arranged close OP f/u with his primary care on 04/17/19. I also included on his AVS that we would recommend they draw a CBC and BMET and asked him to bring the AVS with him to that appointment. We have also arranged f/u in our [facility] office on 10/29. He has a f/u in November with Dr. Agustin Cree that we will keep. I spoke with Azucena Freed PA with GI who will have the GI clinic reach out to patient after discharge for any necessary follow-up. GI recommends he be discharged on omeprazole 40mg  as outpatient. I confirmed with wife that he has plenty of this at home.  2. CAD with exertional angina - he ruled out for MI. This was suspected due to demand process from anemia superimposed on known CAD. His symptoms resolved with blood transfusion. Dr. Harrington Challenger did not feel any further ischemic evaluation was necessary. He ambulated in the hall just fine this afternoon. He will continue home antianginals.  3. Acute diastolic CHF - occurred in setting of anemia. He received 2 doses IV diuretics earlier on admission and appears euvolemic. He was on chlorthalidone at home but BP was normal this morning at 125/50. This has been labile over the last day with readings from 97 up to [id number] systolic. He was advised to monitor at home and call if tending to run  [id number] systolic or [id number] diastolic.   4. CKD stage III - baseline Cr appears 1.3-1.5. Chlorthalidone remains on hold - BP controlled.  5. PAF - maintaining NSR this admission (atrial sensed, ventricular paced). See above regarding anticoagulation.  Dr. Harrington Challenger has seen and examined the patient today and feels he is stable for discharge. I spoke with wife at pt's request about discharge instructions and outlined on AVS (will populate below).  Did the patient have an acute coronary syndrome (MI, NSTEMI, STEMI, etc) this admission?:  No                               Did the patient have a percutaneous coronary intervention (stent / angioplasty)?:  No.   _____________  Discharge Vitals Blood pressure (!) 152/54, pulse 62, temperature 97.6 F (36.4 C), temperature source Oral, resp. rate 18, height 5\' 5"  (1.651 m), weight 78.9 kg, SpO2 97 %.  Filed Weights   04/11/19 1323 04/12/19 0443 04/12/19 0607  Weight: 82.5 kg 82.5 kg 78.9 kg    Labs & Radiologic Studies    CBC Recent Labs    04/11/19 0459 04/12/19 0458  WBC 5.8 5.1  HGB 9.3* 9.3*  HCT 28.5* 28.9*  MCV 76.8* 76.9*  PLT 265  [id number]   Basic Metabolic Panel Recent Labs    04/09/19 1453 04/10/19 0448  NA 137 139  K 4.4 4.0  CL 106 104  CO2 22 25  GLUCOSE 97 87  BUN 14 13  CREATININE 1.43* 1.47*  CALCIUM 8.8* 9.2   Liver Function Tests Recent Labs    04/09/19 1453  AST 18  ALT 12  ALKPHOS 64  BILITOT 0.4  PROT 6.4*  ALBUMIN 3.4*   No results for input(s): LIPASE, AMYLASE in the last 72 hours. High Sensitivity Troponin:   Recent Labs  Lab 04/09/19 1453 04/09/19 1728  TROPONINIHS 7 9    BNP Invalid input(s): POCBNP D-Dimer No results for input(s): DDIMER in the last 72 hours. Hemoglobin A1C No results for input(s): HGBA1C in the last 72 hours. Fasting Lipid Panel Recent Labs    04/10/19 0448  CHOL 119  HDL 54  LDLCALC 54  TRIG 56  CHOLHDL 2.2   Thyroid Function Tests Recent Labs    04/09/19 1728    TSH 2.307   _____________  Ct Abdomen Pelvis W Contrast  Result Date: 04/11/2019 CLINICAL DATA:  Iron deficiency anemia. EXAM: CT ABDOMEN AND PELVIS WITH CONTRAST TECHNIQUE: Multidetector CT imaging of the abdomen and pelvis was performed using the standard protocol following bolus administration of intravenous contrast. CONTRAST:  40mL OMNIPAQUE IOHEXOL 300 MG/ML  SOLN COMPARISON:  05/03/2017 from [place]: Lower Chest: Small right pleural effusion and mild dependent atelectasis. Stable small left posterior diaphragmatic hernia containing only fat. Hepatobiliary: No hepatic masses identified. Large calcified gallstone again seen gallbladder fundus, however there is no evidence of cholecystitis or biliary ductal dilatation. Pancreas:  No mass or inflammatory changes. Spleen: Within normal limits in size and appearance. Adrenals/Urinary Tract: No masses identified. No evidence of hydronephrosis. Stomach/Bowel: Stable large hiatal hernia containing majority of stomach. No evidence of obstruction, inflammatory process or abnormal fluid collections. Normal appendix visualized. Vascular/Lymphatic: No pathologically enlarged lymph nodes. No abdominal aortic aneurysm. Aortic atherosclerosis. Reproductive: Obscured by extensive beam hardening artifact from bilateral hip prostheses. Other:  None. Musculoskeletal:  No suspicious bone lesions identified. IMPRESSION: No evidence of neoplasm or other acute findings within the abdomen or pelvis. Stable large hiatal hernia containing majority of stomach, and small left posterior diaphragmatic hernia containing only fat. Cholelithiasis. No radiographic evidence of cholecystitis. Small right pleural effusion. Electronically Signed   By: Marlaine Hind M.D.   On: 04/11/2019 19:50   Dg Chest Port 1 View  Result Date: 04/09/2019 CLINICAL DATA:  Shortness of breath. EXAM: PORTABLE CHEST 1 VIEW COMPARISON:  August 15, 2017 FINDINGS: Bilateral layering  effusions with underlying opacities, possibly atelectasis. Stable cardiomegaly. Stable pacemaker. The hila and mediastinum are unchanged. No pneumothorax. IMPRESSION: New bilateral small layering effusions with underlying opacities, likely atelectasis. Electronically Signed   By: Dorise Bullion III M.D   On: 04/09/2019 14:39   Disposition   Pt is being discharged home today in good condition.  Follow-up Plans & Appointments    Follow-up Information    Loel Dubonnet, NP Follow up.   Specialty: Cardiology Why: [facility] - This is the [place] location. You will see Laurann Montana on 04/19/19, appointment information below. Urban Gibson is one of the nurse practitioners that works with Dr. Agustin Cree. Contact information: [place] [address] [place] [address] [phone]        Lillard Anes, MD Follow up.   Specialty: Family Medicine Why: We have made you a post-hospital  appointment for 04/17/19 at 2pm with Dr. Henrene Pastor. Please take this paper with you to that visit. We would recommend they draw a CBC and BMET. Contact information: [address] [place] [address] [phone]          Discharge Instructions    Diet - low sodium heart healthy   Complete by: As directed    Discharge instructions   Complete by: As directed    You can restart your Eliquis on the evening of 04/15/19. If you notice any bleeding such as blood in stool, black tarry stools, blood in urine, nosebleeds or any other unusual bleeding, call your doctor immediately. It is not normal to have this kind of bleeding while on a blood thinner and usually indicates there is an underlying problem with one of your body systems that needs to be checked out. We sent in a refill of this medicine since it looks like you were due.  Your chlorthalidone was stopped in the hospital. Please monitor your blood pressure occasionally at home. Call Dr. Wendy Poet office if you tend to get  readings of greater than 130 on the top number or 80 on the bottom number.  The GI team would recommend you take 2 of the 20mg  Prilosec tablets daily, not 1.  When you see your primary care doctor, we would recommend they recheck your blood count and kidney function at that visit to make sure you are tolerating the blood thinner. Your blood thinner dose may also need to be adjusted in the future if your kidney function number goes higher.   Increase activity slowly   Complete by: As directed       Discharge Medications   Allergies as of 04/12/2019   No Known Allergies     Medication List    STOP taking these medications   chlorthalidone 25 MG tablet Commonly known as: HYGROTON     TAKE these medications   albuterol 108 (90 Base) MCG/ACT inhaler Commonly known as: VENTOLIN HFA Inhale 2 puffs into the lungs 4 (four) times daily as needed for wheezing or shortness of breath.   amLODipine 5 MG tablet Commonly known as: NORVASC TAKE 1 TABLET(5 MG) BY MOUTH DAILY What changed: See the new instructions.   apixaban 5 MG Tabs tablet Commonly known as: Eliquis Take 1 tablet (5 mg total) by mouth 2 (two) times daily. Start taking on: April 15, 2019 What changed:   additional instructions  These instructions start on April 15, 2019. If you are unsure what to do until then, ask your doctor or other care provider.   atorvastatin 40 MG tablet Commonly known as: LIPITOR Take 40 mg by mouth at bedtime.   CALCIUM 500 + D PO Take 1 tablet by mouth daily.   fluticasone 110 MCG/ACT inhaler Commonly known as: FLOVENT HFA Inhale 2 puffs into the lungs daily.   HYDROcodone-acetaminophen 5-325 MG tablet Commonly known as: NORCO/VICODIN Take 1 tablet by mouth 2 (two) times daily as needed (pain).   isosorbide mononitrate 60 MG 24 hr tablet Commonly known as: IMDUR Take 60 mg by mouth at bedtime.   levothyroxine 50 MCG tablet Commonly known as: SYNTHROID Take 50 mcg by mouth  at bedtime.   multivitamin with minerals Tabs tablet Take 1 tablet by mouth daily.   nitroGLYCERIN 0.4 MG SL tablet Commonly known as: NITROSTAT DISSOLVE 1 TABLET UNDER THE TONGUE EVERY 5 MINUTES AS NEEDED FOR CHEST PAIN. MAX 3 DOSES IN 15 MINUTES What changed:  how much to take  how to take this  when to take this  reasons to take this  additional instructions   omeprazole 20 MG tablet Commonly known as: PRILOSEC OTC Take 2 tablets (40 mg total) by mouth daily. What changed:   how much to take  when to take this   ranolazine 500 MG 12 hr tablet Commonly known as: RANEXA TAKE 1 TABLET(500 MG) BY MOUTH TWICE DAILY What changed: See the new instructions.   vitamin B-12 1000 MCG tablet Commonly known as: CYANOCOBALAMIN Take 1,000 mcg by mouth daily.        Outstanding Labs/Studies   N/a - but have recommended BMET/CBC at f/u appointment with primary care as above  Duration of Discharge Encounter   Greater than 30 minutes including physician time.  Signed, Charlie Pitter, PA-C 04/12/2019, 2:28 PM

## 2019-04-12 NOTE — Progress Notes (Signed)
Patient alert and oriented, pt./ denies pain VSS, iv and tele removed. D/c instruction explain to the patient and wife d/c instruction copy given all questions answered./ patient d./c home per order./

## 2019-04-12 NOTE — Progress Notes (Signed)
Daily Rounding Note  04/12/2019, 10:30 AM  LOS: 2 days   SUBJECTIVE:   Chief complaint: IDA    Pt feeling well.  No BM's.  Appetite good.    OBJECTIVE:         Vital signs in last 24 hours:    Temp:  [97.6 F (36.4 C)-98.5 F (36.9 C)] 98.2 F (36.8 C) (10/22 0540) Pulse Rate:  [62-78] 69 (10/22 0540) Resp:  [13-21] 16 (10/22 0540) BP: (97-185)/(44-66) 125/50 (10/22 0540) SpO2:  [92 %-97 %] 94 % (10/22 0540) Weight:  [78.9 kg-82.5 kg] 78.9 kg (10/22 0607) Last BM Date: 04/11/19 Filed Weights   04/11/19 1323 04/12/19 0443 04/12/19 0607  Weight: 82.5 kg 82.5 kg 78.9 kg   General: looks chronically unwell Heart: RRR Chest: clear bil.  No SOB or cough Abdomen: soft, NT, ND  Extremities: no CCE Neuro/Psych:  Oriented x 3.  Fluid speech.    Intake/Output from previous day: 10/21 0701 - 10/22 0700 In: 375.1 [I.V.:325.1; IV Piggyback:50] Out: 1900 [Urine:1900]  Intake/Output this shift: Total I/O In: 360 [P.O.:360] Out: -   Lab Results: Recent Labs    04/10/19 0448 04/10/19 1955 04/11/19 0459 04/12/19 0458  WBC 4.8  --  5.8 5.1  HGB 7.4* 10.0* 9.3* 9.3*  HCT 23.7* 30.5* 28.5* 28.9*  PLT 264  --  265 275   BMET Recent Labs    04/09/19 1453 04/10/19 0448  NA 137 139  K 4.4 4.0  CL 106 104  CO2 22 25  GLUCOSE 97 87  BUN 14 13  CREATININE 1.43* 1.47*  CALCIUM 8.8* 9.2   LFT Recent Labs    04/09/19 1453  PROT 6.4*  ALBUMIN 3.4*  AST 18  ALT 12  ALKPHOS 64  BILITOT 0.4   PT/INR No results for input(s): LABPROT, INR in the last 72 hours. Hepatitis Panel No results for input(s): HEPBSAG, HCVAB, HEPAIGM, HEPBIGM in the last 72 hours.  Studies/Results: Ct Abdomen Pelvis W Contrast  Result Date: 04/11/2019 CLINICAL DATA:  Iron deficiency anemia. EXAM: CT ABDOMEN AND PELVIS WITH CONTRAST TECHNIQUE: Multidetector CT imaging of the abdomen and pelvis was performed using the standard  protocol following bolus administration of intravenous contrast. CONTRAST:  70mL OMNIPAQUE IOHEXOL 300 MG/ML  SOLN COMPARISON:  05/03/2017 from [place]: Lower Chest: Small right pleural effusion and mild dependent atelectasis. Stable small left posterior diaphragmatic hernia containing only fat. Hepatobiliary: No hepatic masses identified. Large calcified gallstone again seen gallbladder fundus, however there is no evidence of cholecystitis or biliary ductal dilatation. Pancreas:  No mass or inflammatory changes. Spleen: Within normal limits in size and appearance. Adrenals/Urinary Tract: No masses identified. No evidence of hydronephrosis. Stomach/Bowel: Stable large hiatal hernia containing majority of stomach. No evidence of obstruction, inflammatory process or abnormal fluid collections. Normal appendix visualized. Vascular/Lymphatic: No pathologically enlarged lymph nodes. No abdominal aortic aneurysm. Aortic atherosclerosis. Reproductive: Obscured by extensive beam hardening artifact from bilateral hip prostheses. Other:  None. Musculoskeletal:  No suspicious bone lesions identified. IMPRESSION: No evidence of neoplasm or other acute findings within the abdomen or pelvis. Stable large hiatal hernia containing majority of stomach, and small left posterior diaphragmatic hernia containing only fat. Cholelithiasis. No radiographic evidence of cholecystitis. Small right pleural effusion. Electronically Signed   By: Marlaine Hind M.D.   On: 04/11/2019 19:50   Scheduled Meds: . amLODipine  5 mg Oral QHS  . atorvastatin  40 mg Oral QHS  .  budesonide  0.25 mg Nebulization BID  . ipratropium  2 puff Inhalation Once  . isosorbide mononitrate  60 mg Oral QHS  . levothyroxine  50 mcg Oral Q0600  . pantoprazole  40 mg Oral Daily  . ranolazine  500 mg Oral BID  . vitamin B-12  1,000 mcg Oral Daily   Continuous Infusions: . sodium chloride Stopped (04/11/19 1508)   PRN Meds:.acetaminophen,  albuterol, HYDROcodone-acetaminophen, nitroGLYCERIN, ondansetron (ZOFRAN) IV  ASSESMENT:   *    Transfusion requiring iron deficiency anemia.   04/11/2019 EGD.  Moderate hiatal hernia, no Cameron's erosions.  Gastritis, biopsied for H. pylori.  2.5 cm duodenal polyp (likely adenoma) biopsied. 04/11/2019 colonoscopy: 2 polyps removed otherwise unremarkable study. 04/11/2019 CTAP w contrast.  Stable, large HH with majority of stomach in the chest.  Small, fat-containing left posterior diaphragmatic hernia.  Cholelithiasis.  Small right pleural effusion. Hgb 7.6 >> 1 PRBC >> 9.3.  Feraheme on 10/19.  *     Chronic Eliquis for A. fib.  PLAN   *   Await pathology findings.  *    Consider future, elective EGD with endoscopic mucosal resection of the duodenal polyp, GI will be in touch w pt who is reluctant to undergo further endoscopies.    *   Ok to restart Eliquis but can cards wait 3 or 4 days to do this? As it would help reduce possibility of post polypectomy bleeding.      Azucena Freed  04/12/2019, 10:30 AM Phone [phone]

## 2019-04-12 NOTE — Progress Notes (Signed)
Ambulate patient in hallway, patient denies cp, no c/o shortness of breath, oxygen saturation 95-97%/RA. Will continue to monitor the patient.

## 2019-04-12 NOTE — Progress Notes (Addendum)
Progress Note  Patient Name: TIERAN VANBOXTEL Date of Encounter: 04/12/2019  Primary Cardiologist: Jenne Campus, MD  Subjective   Had diffuse arthritis pain last night (prompting nurse note) but no further CP or SOB. Able to walk to the bathroom without symptoms. No orthopnea or edema.   Inpatient Medications    Scheduled Meds:  amLODipine  5 mg Oral QHS   atorvastatin  40 mg Oral QHS   budesonide  0.25 mg Nebulization BID   ipratropium  2 puff Inhalation Once   isosorbide mononitrate  60 mg Oral QHS   levothyroxine  50 mcg Oral Q0600   pantoprazole  40 mg Oral Daily   ranolazine  500 mg Oral BID   vitamin B-12  1,000 mcg Oral Daily   Continuous Infusions:  sodium chloride Stopped (04/11/19 1508)   PRN Meds: acetaminophen, albuterol, HYDROcodone-acetaminophen, nitroGLYCERIN, ondansetron (ZOFRAN) IV   Vital Signs    Vitals:   04/11/19 2212 04/12/19 0443 04/12/19 0540 04/12/19 0607  BP:   (!) 125/50   Pulse:   69   Resp:   16   Temp:   98.2 F (36.8 C)   TempSrc:   Oral   SpO2: 95%  94%   Weight:  82.5 kg  78.9 kg  Height:        Intake/Output Summary (Last 24 hours) at 04/12/2019 1106 Last data filed at 04/12/2019 0826 Gross per 24 hour  Intake 535.14 ml  Output 1900 ml  Net -1364.86 ml   Last 3 Weights 04/12/2019 04/12/2019 04/11/2019  Weight (lbs) 174 lb 181 lb 14.1 oz 181 lb 14.1 oz  Weight (kg) 78.926 kg 82.5 kg 82.5 kg     Telemetry    Predominantly A sensed, V paced but brief run of v-paced tachycardia around 9:19, asymptomatic - Personally Reviewed  ECG    Yesterday - atrial sensed, V paced rhythm - Personally Reviewed  Physical Exam   GEN: No acute distress.  HEENT: Normocephalic, atraumatic, sclera non-icteric. Neck: No JVD or bruits. Cardiac: RRR soft SEM primarily at LSB but heard throughout precordium. No rubs rubs or gallops. Radials/DP/PT 1+ and equal bilaterally.  Respiratory: Clear to auscultation bilaterally.  Breathing is unlabored. GI: Soft, nontender, non-distended, BS +x 4. MS: no deformity. Extremities: No clubbing or cyanosis. No edema. Distal pedal pulses are 2+ and equal bilaterally. Neuro:  AAOx3. Follows commands. Mildly hard of hearing Psych:  Responds to questions appropriately with a normal affect.  Labs    High Sensitivity Troponin:   Recent Labs  Lab 04/09/19 1453 04/09/19 1728  TROPONINIHS 7 9      Cardiac EnzymesNo results for input(s): TROPONINI in the last 168 hours. No results for input(s): TROPIPOC in the last 168 hours.   Chemistry Recent Labs  Lab 04/09/19 1453 04/10/19 0448  NA 137 139  K 4.4 4.0  CL 106 104  CO2 22 25  GLUCOSE 97 87  BUN 14 13  CREATININE 1.43* 1.47*  CALCIUM 8.8* 9.2  PROT 6.4*  --   ALBUMIN 3.4*  --   AST 18  --   ALT 12  --   ALKPHOS 64  --   BILITOT 0.4  --   GFRNONAA 45* 44*  GFRAA 52* 51*  ANIONGAP 9 10     Hematology Recent Labs  Lab 04/10/19 0448 04/10/19 1955 04/11/19 0459 04/12/19 0458  WBC 4.8  --  5.8 5.1  RBC 3.14*  --  3.71* 3.76*  HGB 7.4* 10.0* 9.3*  9.3*  HCT 23.7* 30.5* 28.5* 28.9*  MCV 75.5*  --  76.8* 76.9*  MCH 23.6*  --  25.1* 24.7*  MCHC 31.2  --  32.6 32.2  RDW 16.5*  --  16.9* 17.5*  PLT 264  --  265 275    BNP Recent Labs  Lab 04/09/19 1453  BNP 286.1*     DDimer No results for input(s): DDIMER in the last 168 hours.   Radiology    Ct Abdomen Pelvis W Contrast  Result Date: 04/11/2019 CLINICAL DATA:  Iron deficiency anemia. EXAM: CT ABDOMEN AND PELVIS WITH CONTRAST TECHNIQUE: Multidetector CT imaging of the abdomen and pelvis was performed using the standard protocol following bolus administration of intravenous contrast. CONTRAST:  22mL OMNIPAQUE IOHEXOL 300 MG/ML  SOLN COMPARISON:  05/03/2017 from [place]: Lower Chest: Small right pleural effusion and mild dependent atelectasis. Stable small left posterior diaphragmatic hernia containing only fat. Hepatobiliary:  No hepatic masses identified. Large calcified gallstone again seen gallbladder fundus, however there is no evidence of cholecystitis or biliary ductal dilatation. Pancreas:  No mass or inflammatory changes. Spleen: Within normal limits in size and appearance. Adrenals/Urinary Tract: No masses identified. No evidence of hydronephrosis. Stomach/Bowel: Stable large hiatal hernia containing majority of stomach. No evidence of obstruction, inflammatory process or abnormal fluid collections. Normal appendix visualized. Vascular/Lymphatic: No pathologically enlarged lymph nodes. No abdominal aortic aneurysm. Aortic atherosclerosis. Reproductive: Obscured by extensive beam hardening artifact from bilateral hip prostheses. Other:  None. Musculoskeletal:  No suspicious bone lesions identified. IMPRESSION: No evidence of neoplasm or other acute findings within the abdomen or pelvis. Stable large hiatal hernia containing majority of stomach, and small left posterior diaphragmatic hernia containing only fat. Cholelithiasis. No radiographic evidence of cholecystitis. Small right pleural effusion. Electronically Signed   By: Marlaine Hind M.D.   On: 04/11/2019 19:50    Cardiac Studies   2D Echo 04/10/19  1. Left ventricular ejection fraction, by visual estimation, is 60 to 65%. The left ventricle has normal function. Normal left ventricular size. Left ventricular septal wall thickness was mildly increased. Mildly increased left ventricular posterior  wall thickness.  2. Global right ventricle has normal systolic function.The right ventricular size is normal. No increase in right ventricular wall thickness.  3. A pacer wire is visualized.  4. Left atrial size was severely dilated.  5. Right atrial size was moderately dilated.  6. The mitral valve is normal in structure. Mild mitral valve regurgitation. No evidence of mitral stenosis.  7. The tricuspid valve is normal in structure. Tricuspid valve regurgitation  mild-moderate.  8. The aortic valve is tricuspid. There is Moderate thickening of the aortic valve. There is Moderate calcification of the aortic valve. Aortic valve regurgitation is mild to moderate by color flow Doppler. Mild aortic valve stenosis.  9. The pulmonic valve was normal in structure. Pulmonic valve regurgitation is not visualized by color flow Doppler. 10. Left ventricular diastolic Doppler parameters are consistent with pseudonormalization pattern of LV diastolic filling. 11. Elevated left atrial and left ventricular end-diastolic pressures.  Patient Profile     84 y.o. male with history of moderate nonobstructive CAD by cath in 2018, paroxysmal atrial fibrillation on eliquis, CHB s/p PPM (followed by Dr. Curt Bears), PVD, chronic anemia, carotid artery disease with occluded R ICA, HTN, HLD, CVA, hypothyroidism, asthma, CKD stage 3, and GERD. Admit note indicates h/o chronic combined CHF but prior echoes in Epic showed normal LVF. He was admitted with increasing dyspnea and chest pressure on  exertion, improved at rest. Also felt to have component of CHF. He was found to have worsening anemia with Hgb of 7.6 (decreased from 01/2018 when it was 9.3). hsTroponin negative.  Assessment & Plan    1. Symptomatic acute on chronic anemia with iron deficiency - transfused 1 unit. GI following. EGD this admission with gastritis, duodenal polyp. There were 2 polyps in transverse colon. Findings did not entirely explain degree of anemia. CT abd pelvis showed no evidence of neoplasm; + large hiatal hernia, + cholelithiasis, no cholecystitis, small right pleural effusion. Patient is also noted to have evidence of CKD so question if this is contributing. Received IV feraheme on 10/20. Hgb stable compared to yesterday. Will need close OP f/u with primary care. He follows with family doctor in [place].  2. CAD with exertional angina - suspect demand process from anemia, no recurrence overnight or this  morning. Wrote order for nurse to ambulate patient and let me know how he does. I do not see plan for ischemic reassessment this admission; will need to ensure anemia is stable as outpatient- can consider OP nuclear stress testing if any recurrent symptoms.  3. Acute diastolic CHF - in setting of anemia. Received IV diuresis earlier this admission, no standing diuretic ordered. Was on chlorthalidone at home but BP looks fine today. He appears euvolemic.  4. CKD stage III - baseline Cr appears 1.3-1.5. Chlorthalidone remains on hold - BP controlled.  5. PAF - await MD input on when we are resuming Eliquis. EGD report indicates it would be safe to do so today from GI standpoint. Will also ask Dr. Harrington Challenger to weigh in on tele event from 9:19 - brief tachycardia, V paced, difficult to see P waves. Pt was asymptomatic.  For questions or updates, please contact [facility] Please consult [email or web address] for contact info under Cardiology/STEMI.  Signed, Charlie Pitter, PA-C 04/12/2019, 11:06 AM    Patient seen and exmamined   I agree with findings as noted above  Pt presented with worrisome symptoms of unstable angina    Found to have Hgb in low 7s    Since transfusion his symptoms are gone Endoscopy yesterday with gastritis and polyps (duodenal, colon) Per GI OK to resume anticoag today  On exam Lungs   Faint rales at bases Cardiac RRR   NO S3  No murmurs Ext are without edema  PK to d/c from cardiac standpoint    WIll need close f/u of H/H and renal functon (for NOAC dosing) I would not schedu further cardiac testing UNLESS he develops symptoms   Nevin Bloodgood RossMD 04/12/2019

## 2019-04-13 ENCOUNTER — Encounter (HOSPITAL_COMMUNITY): Payer: Self-pay | Admitting: Gastroenterology

## 2019-04-17 ENCOUNTER — Other Ambulatory Visit: Payer: Self-pay

## 2019-04-17 DIAGNOSIS — I200 Unstable angina: Secondary | ICD-10-CM | POA: Diagnosis not present

## 2019-04-17 DIAGNOSIS — Z1159 Encounter for screening for other viral diseases: Secondary | ICD-10-CM | POA: Diagnosis not present

## 2019-04-17 DIAGNOSIS — D6489 Other specified anemias: Secondary | ICD-10-CM | POA: Diagnosis not present

## 2019-04-17 DIAGNOSIS — D509 Iron deficiency anemia, unspecified: Secondary | ICD-10-CM

## 2019-04-18 ENCOUNTER — Telehealth: Payer: Self-pay | Admitting: Cardiology

## 2019-04-18 NOTE — Telephone Encounter (Signed)
New Message  Patient is calling in to get approval to accompany patient to his appointment. States that patient is hard of hearing and needs someone at the appointment with him on 04/19/19 at 9:00 am with Owens Shark.

## 2019-04-19 ENCOUNTER — Other Ambulatory Visit: Payer: Self-pay

## 2019-04-19 ENCOUNTER — Encounter: Payer: Self-pay | Admitting: Family

## 2019-04-19 ENCOUNTER — Ambulatory Visit (INDEPENDENT_AMBULATORY_CARE_PROVIDER_SITE_OTHER): Payer: PPO | Admitting: Family

## 2019-04-19 VITALS — BP 142/68 | HR 93 | Ht 65.0 in | Wt 185.0 lb

## 2019-04-19 DIAGNOSIS — Z7901 Long term (current) use of anticoagulants: Secondary | ICD-10-CM | POA: Diagnosis not present

## 2019-04-19 DIAGNOSIS — I480 Paroxysmal atrial fibrillation: Secondary | ICD-10-CM | POA: Diagnosis not present

## 2019-04-19 DIAGNOSIS — Z950 Presence of cardiac pacemaker: Secondary | ICD-10-CM | POA: Diagnosis not present

## 2019-04-19 DIAGNOSIS — D509 Iron deficiency anemia, unspecified: Secondary | ICD-10-CM | POA: Diagnosis not present

## 2019-04-19 DIAGNOSIS — I5031 Acute diastolic (congestive) heart failure: Secondary | ICD-10-CM

## 2019-04-19 DIAGNOSIS — N183 Chronic kidney disease, stage 3 unspecified: Secondary | ICD-10-CM | POA: Diagnosis not present

## 2019-04-19 DIAGNOSIS — I10 Essential (primary) hypertension: Secondary | ICD-10-CM

## 2019-04-19 DIAGNOSIS — I2510 Atherosclerotic heart disease of native coronary artery without angina pectoris: Secondary | ICD-10-CM | POA: Diagnosis not present

## 2019-04-19 MED ORDER — ISOSORBIDE MONONITRATE ER 60 MG PO TB24: 60.0000 mg | ORAL_TABLET | Freq: Every day | ORAL | 1 refills | Status: DC

## 2019-04-19 NOTE — Patient Instructions (Signed)
Medication Instructions:  No medication changes today.   A refill of your Isosorbide Mononitrate (Imdur) has been sent to the pharmacy.   *If you need a refill on your cardiac medications before your next appointment, please call your pharmacy*  Lab Work: No lab work today. We will request labs from Dr. Blanch Media office.   If you have labs (blood work) drawn today and your tests are completely normal, you will receive your results only by: Marland Kitchen MyChart Message (if you have MyChart) OR . A paper copy in the mail If you have any lab test that is abnormal or we need to change your treatment, we will call you to review the results.  Testing/Procedures: None ordered today.  Follow-Up: At [facility], you and your health needs are our priority.  As part of our continuing mission to provide you with exceptional heart care, we have created designated Provider Care Teams.  These Care Teams include your primary Cardiologist (physician) and Advanced Practice Providers (APPs -  Physician Assistants and Nurse Practitioners) who all work together to provide you with the care you need, when you need it.  Your next appointment:   3 weeks  The format for your next appointment:   In Person  Provider:   Jenne Campus, MD  Other Instructions  Continue to weigh yourself daily. Please report weight gain of 3lb overnight or 5lb in one week.   Recommend iron-rich foods in your diet.   Report any blood in urine or blood in stool.    Iron-Rich Diet  Iron is a mineral that helps your body to produce hemoglobin. Hemoglobin is a protein in red blood cells that carries oxygen to your body's tissues. Eating too little iron may cause you to feel weak and tired, and it can increase your risk of infection. Iron is naturally found in many foods, and many foods have iron added to them (iron-fortified foods). You may need to follow an iron-rich diet if you do not have enough iron in your body due to certain  medical conditions. The amount of iron that you need each day depends on your age, your sex, and any medical conditions you have. Follow instructions from your health care provider or a diet and nutrition specialist (dietitian) about how much iron you should eat each day. What are tips for following this plan? Reading food labels  Check food labels to see how many milligrams (mg) of iron are in each serving. Cooking  Cook foods in pots and pans that are made from iron.  Take these steps to make it easier for your body to absorb iron from certain foods: ? Soak beans overnight before cooking. ? Soak whole grains overnight and drain them before using. ? Ferment flours before baking, such as by using yeast in bread dough. Meal planning  When you eat foods that contain iron, you should eat them with foods that are high in vitamin C. These include oranges, peppers, tomatoes, potatoes, and mango. Vitamin C helps your body to absorb iron. General information  Take iron supplements only as told by your health care provider. An overdose of iron can be life-threatening. If you were prescribed iron supplements, take them with orange juice or a vitamin C supplement.  When you eat iron-fortified foods or take an iron supplement, you should also eat foods that naturally contain iron, such as meat, poultry, and fish. Eating naturally iron-rich foods helps your body to absorb the iron that is added to other foods  or contained in a supplement.  Certain foods and drinks prevent your body from absorbing iron properly. Avoid eating these foods in the same meal as iron-rich foods or with iron supplements. These foods include: ? Coffee, black tea, and red wine. ? Milk, dairy products, and foods that are high in calcium. ? Beans and soybeans. ? Whole grains. What foods should I eat? Fruits Prunes. Raisins. Eat fruits high in vitamin C, such as oranges, grapefruits, and strawberries, alongside iron-rich foods.  Vegetables Spinach (cooked). Green peas. Broccoli. Fermented vegetables. Eat vegetables high in vitamin C, such as leafy greens, potatoes, bell peppers, and tomatoes, alongside iron-rich foods. Grains Iron-fortified breakfast cereal. Iron-fortified whole-wheat bread. Enriched rice. Sprouted grains. Meats and other proteins Beef liver. Oysters. Beef. Shrimp. [place]. Chicken. [place]. Sardines. Chickpeas. Nuts. Tofu. Pumpkin seeds. Beverages Tomato juice. Fresh orange juice. Prune juice. Hibiscus tea. Fortified instant breakfast shakes. Sweets and desserts Blackstrap molasses. Seasonings and condiments Tahini. Fermented soy sauce. Other foods Wheat germ. The items listed above may not be a complete list of recommended foods and beverages. Contact a dietitian for more information. What foods should I avoid? Grains Whole grains. Bran cereal. Bran flour. Oats. Meats and other proteins Soybeans. Products made from soy protein. Black beans. Lentils. Mung beans. Split peas. Dairy Milk. Cream. Cheese. Yogurt. Cottage cheese. Beverages Coffee. Black tea. Red wine. Sweets and desserts Cocoa. Chocolate. Ice cream. Other foods Basil. Oregano. Large amounts of parsley. The items listed above may not be a complete list of foods and beverages to avoid. Contact a dietitian for more information. Summary  Iron is a mineral that helps your body to produce hemoglobin. Hemoglobin is a protein in red blood cells that carries oxygen to your body's tissues.  Iron is naturally found in many foods, and many foods have iron added to them (iron-fortified foods).  When you eat foods that contain iron, you should eat them with foods that are high in vitamin C. Vitamin C helps your body to absorb iron.  Certain foods and drinks prevent your body from absorbing iron properly, such as whole grains and dairy products. You should avoid eating these foods in the same meal as iron-rich foods or with iron supplements.  This information is not intended to replace advice given to you by your health care provider. Make sure you discuss any questions you have with your health care provider. Document Released: 01/19/2005 Document Revised: 05/20/2017 Document Reviewed: 05/03/2017 Elsevier Patient Education  2020 [employer].

## 2019-04-19 NOTE — Progress Notes (Signed)
Office Visit    Patient Name: [name] Date of Encounter: 04/19/2019  Primary Care Provider:  Lillard Anes, MD Primary Cardiologist:  Jenne Campus, MD Electrophysiologist:  Constance Haw, MD   Chief Complaint    [name] is a 84 y.o. male with a hx of moderate nonobstructive CAD by cath in 2018, PAF on Eliquis, CHB status post PPM (followed by Dr. Curt Bears), chronic anemia, PVD with carotid artery disease with occluded R ICA, HTN, HLD, CVA, hypothyroidism, asthma, CKD 3, GERD presents today for follow-up after hospitalization.  Past Medical History    Past Medical History:  Diagnosis Date  . Arthritis   . Asthma   . Carotid artery disease ([place])    a. h/o occluded R ICA.  Marland Kitchen CHB (complete heart block) (HCC)    a. s/p ppm.  . Chronic anemia   . CKD (chronic kidney disease), stage III   . Coronary artery disease    a. moderate nonobstructive CAD by cath in 2018.  Marland Kitchen GERD (gastroesophageal reflux disease)   . Hypertension   . Hypothyroidism   . Iron deficiency   . PAF (paroxysmal atrial fibrillation) ([place])   . Peripheral vascular disease [facility])    Past Surgical History:  Procedure Laterality Date  . BIOPSY  04/11/2019   Procedure: BIOPSY;  Surgeon: Milus Banister, MD;  Location: [place];  Service: Endoscopy;;  . CATARACT EXTRACTION    . COLONOSCOPY WITH PROPOFOL N/A 04/11/2019   Procedure: COLONOSCOPY WITH PROPOFOL;  Surgeon: Milus Banister, MD;  Location: [facility] ENDOSCOPY;  Service: Endoscopy;  Laterality: N/A;  . ESOPHAGOGASTRODUODENOSCOPY (EGD) WITH PROPOFOL N/A 04/11/2019   Procedure: ESOPHAGOGASTRODUODENOSCOPY (EGD) WITH PROPOFOL;  Surgeon: Milus Banister, MD;  Location: [facility] ENDOSCOPY;  Service: Endoscopy;  Laterality: N/A;  . HIP SURGERY    . INSERT / REPLACE / REMOVE PACEMAKER     Medtronic  . JOINT REPLACEMENT Right 10/20/1994   Hip  (Left Hip in 10/96)  . LEFT HEART CATH AND CORONARY ANGIOGRAPHY N/A 02/08/2017   Procedure: LEFT HEART CATH AND CORONARY ANGIOGRAPHY;  Surgeon: Martinique, Peter M, MD;  Location: [place] CV LAB;  Service: Cardiovascular;  Laterality: N/A;  . ORIF PERIPROSTHETIC FRACTURE Right 08/17/2017   Procedure: OPEN REDUCTION INTERNAL FIXATION (ORIF) PERIPROSTHETIC FRACTURE RIGHT FEMUR;  Surgeon: Paralee Cancel, MD;  Location: [facility];  Service: Orthopedics;  Laterality: Right;  . ORIF PERIPROSTHETIC FRACTURE Right 01/23/2018   Procedure: Redo open reduction internal fixation right periprosthetic femur fracture;  Surgeon: Paralee Cancel, MD;  Location: [facility];  Service: Orthopedics;  Laterality: Right;  120 mins  . POLYPECTOMY  04/11/2019   Procedure: POLYPECTOMY;  Surgeon: Milus Banister, MD;  Location: [facility] ENDOSCOPY;  Service: Endoscopy;;    Allergies  No Known Allergies  History of Present Illness    [name] is a 84 y.o. male with a hx of hx of moderate nonobstructive CAD by cath in 2018, PAF on Eliquis, CHB status post PPM (followed by Dr. Curt Bears), chronic anemia, PVD with carotid artery disease with occluded R ICA, HTN, HLD, CVA, hypothyroidism, asthma, CKD 3, GERD last seen while hospitalized.  Admitted 04/09/2019 to 04/12/2019.  Presented to office 04/09/2019 with severe exertional chest pain, dyspnea and sent to hospital for admission.  Worsening anemia with hemoglobin 7.6, at bedtime troponin negative, component acute diastolic CHF in context of anemia.  Transfused 1 unit PRBC.  EGD with gastritis and duodenal polyp.  Colonoscopy with 2 polyps  in transverse colon.  CT ABD/pelvis no evidence of neoplasm, positive large hiatal hernia, positive cholelithiasis, no cholecystitis, small right pleural effusion.  No ischemic evaluation recommended as he was ruled out for MI and symptoms resolved with blood transfusion.  He remained sinus rhythm (atrial sensed, V paced) for admission.  Presents today for follow-up with his wife.  He reports feeling well.  He is very happy that he  has had increased energy level, no recurrence of shortness of breath, no recurrence of chest pain.  Tells me he has been able to do more than he was for many weeks prior to feeling poorly.  He saw his primary care provider Tuesday and tells me labs were drawn and that they were told they were good.  Labs have not yet been released to K PN for my review.  We discussed iron rich diet and symptoms for which to report including shortness of breath, DOE, fatigue, edema, weight gain.  EKGs/Labs/Other Studies Reviewed:   The following studies were reviewed today:  2D echo 04/10/19 IMPRESSIONS   1. Left ventricular ejection fraction, by visual estimation, is 60 to 65%. The left ventricle has normal function. Normal left ventricular size. Left ventricular septal wall thickness was mildly increased. Mildly increased left ventricular posterior  wall thickness.  2. Global right ventricle has normal systolic function.The right ventricular size is normal. No increase in right ventricular wall thickness.  3. A pacer wire is visualized.  4. Left atrial size was severely dilated.  5. Right atrial size was moderately dilated.  6. The mitral valve is normal in structure. Mild mitral valve regurgitation. No evidence of mitral stenosis.  7. The tricuspid valve is normal in structure. Tricuspid valve regurgitation mild-moderate.  8. The aortic valve is tricuspid. There is Moderate thickening of the aortic valve. There is Moderate calcification of the aortic valve. Aortic valve regurgitation is mild to moderate by color flow Doppler. Mild aortic valve stenosis.  9. The pulmonic valve was normal in structure. Pulmonic valve regurgitation is not visualized by color flow Doppler. 10. Left ventricular diastolic Doppler parameters are consistent with pseudonormalization pattern of LV diastolic filling. 11. Elevated left atrial and left ventricular end-diastolic pressures.   EKG:  No EKG today. Recent Labs: 04/09/2019:  ALT 12; B Natriuretic Peptide 286.1; TSH 2.307 04/10/2019: BUN 13; Creatinine, Ser 1.47; Potassium 4.0; Sodium 139 04/12/2019: Hemoglobin 9.3; Platelets 275  Recent Lipid Panel    Component Value Date/Time   CHOL 119 04/10/2019 0448   TRIG 56 04/10/2019 0448   HDL 54 04/10/2019 0448   CHOLHDL 2.2 04/10/2019 0448   VLDL 11 04/10/2019 0448   LDLCALC 54 04/10/2019 0448    Home Medications   No outpatient medications have been marked as taking for the 04/19/19 encounter (Appointment) with Loel Dubonnet, NP.      Review of Systems       Review of Systems  Constitution: Negative for chills, fever and malaise/fatigue.  Cardiovascular: Negative for chest pain, dyspnea on exertion, irregular heartbeat, leg swelling, near-syncope, orthopnea and palpitations.  Respiratory: Negative for cough, shortness of breath and wheezing.   Gastrointestinal: Negative for melena, nausea and vomiting.  Genitourinary: Negative for hematuria.  Neurological: Negative for dizziness, light-headedness and weakness.   All other systems reviewed and are otherwise negative except as noted above.  Physical Exam    VS:  There were no vitals taken for this visit. , BMI There is no height or weight on file to calculate BMI. GEN:  Well nourished, well developed, in no acute distress. HEENT: normal. Neck: Supple, no JVD, carotid bruits, or masses. Cardiac: RRR, no murmurs, rubs, or gallops. No clubbing, cyanosis, edema.  Radials/DP/PT 2+ and equal bilaterally.  Respiratory:  Respirations regular and unlabored, clear to auscultation bilaterally. GI: Soft, nontender, nondistended, BS + x 4. MS: No deformity or atrophy. Skin: Warm and dry, no rash. Neuro:  Strength and sensation are intact. Psych: Normal affect.  Assessment & Plan    1. Chronic anemia with iron deficiency- Hospitalized at [facility] 04/09/2019-04/12/2019.  1 unit PRBC.  EGD and colonoscopy performed, has follow-up with GI in December and was  encouraged to keep this appointment.  Had labs drawn at his PCP this week and per his report the results are good.  Results unavailable for review and every afternoon.  Recommended iron rich diet, education provided.  2. CAD- Stable.  No anginal symptoms.  Denies chest pain, pressure, DOE.  Continue Imdur, refill provided.  Continue Ranexa, statin, as needed nitroglycerin. If additional medical therapy needed, consider beta blocker. No aspirin secondary to chronic anticoagulation.  3. CKDIII - Careful monitoring of kidney function in setting of antihypertensives and Eliquis.   4. PAF- NSR throughout recent hospital admission (atrial sensed, V paced).  Denies palpitations.  Continue present anticoagulation as below.  5. Chronic anticoagulation- Secondary to PAF.  Denies bleeding complications.  Careful monitoring of kidney function, presently remains on 5 mg twice daily of Eliquis and does not qualify for reduced dosing.  6. PPM - Medtronic. Secondary to CHB. Continue to follow with device clinic.  7. Acute diastolic heart failure - In setting of acute on chronic anemia. Resolved after hospitalization. No edema, no DOE, no SOB. Continue to weigh daily and report gain of 3lb overnight of 5 lb in 1 week.   8. HTN - Stable. Continue present antihypertensive regimen. Continue to monitor BP at home with arm cuff.   Disposition: Follow up in 3 week(s) with Dr. Agustin Cree as previously scheduled.   Loel Dubonnet, NP 04/19/2019, 8:27 AM

## 2019-04-27 ENCOUNTER — Other Ambulatory Visit: Payer: Self-pay | Admitting: Cardiology

## 2019-04-30 NOTE — Telephone Encounter (Signed)
Ranexa refill sent to [facility] in [place] per request.

## 2019-05-08 ENCOUNTER — Other Ambulatory Visit: Payer: Self-pay

## 2019-05-08 ENCOUNTER — Encounter: Payer: Self-pay | Admitting: Cardiology

## 2019-05-08 ENCOUNTER — Other Ambulatory Visit: Payer: Self-pay | Admitting: Cardiology

## 2019-05-08 ENCOUNTER — Ambulatory Visit (INDEPENDENT_AMBULATORY_CARE_PROVIDER_SITE_OTHER): Payer: PPO | Admitting: Cardiology

## 2019-05-08 VITALS — BP 162/60 | HR 86 | Ht 65.0 in | Wt 184.0 lb

## 2019-05-08 DIAGNOSIS — E785 Hyperlipidemia, unspecified: Secondary | ICD-10-CM | POA: Diagnosis not present

## 2019-05-08 DIAGNOSIS — I10 Essential (primary) hypertension: Secondary | ICD-10-CM

## 2019-05-08 DIAGNOSIS — Z45018 Encounter for adjustment and management of other part of cardiac pacemaker: Secondary | ICD-10-CM

## 2019-05-08 DIAGNOSIS — I6930 Unspecified sequelae of cerebral infarction: Secondary | ICD-10-CM

## 2019-05-08 DIAGNOSIS — I442 Atrioventricular block, complete: Secondary | ICD-10-CM

## 2019-05-08 DIAGNOSIS — I739 Peripheral vascular disease, unspecified: Secondary | ICD-10-CM

## 2019-05-08 DIAGNOSIS — I480 Paroxysmal atrial fibrillation: Secondary | ICD-10-CM | POA: Diagnosis not present

## 2019-05-08 NOTE — Addendum Note (Signed)
Addended by: Ashok Norris on: 05/08/2019 03:13 PM   Modules accepted: Orders

## 2019-05-08 NOTE — Patient Instructions (Signed)
Medication Instructions:  Your physician recommends that you continue on your current medications as directed. Please refer to the Current Medication list given to you today.  *If you need a refill on your cardiac medications before your next appointment, please call your pharmacy*  Lab Work: Your physician recommends that you return for lab work today: bmp, cbc   If you have labs (blood work) drawn today and your tests are completely normal, you will receive your results only by: Marland Kitchen MyChart Message (if you have MyChart) OR . A paper copy in the mail If you have any lab test that is abnormal or we need to change your treatment, we will call you to review the results.  Testing/Procedures: None.  Follow-Up: At [facility], you and your health needs are our priority.  As part of our continuing mission to provide you with exceptional heart care, we have created designated Provider Care Teams.  These Care Teams include your primary Cardiologist (physician) and Advanced Practice Providers (APPs -  Physician Assistants and Nurse Practitioners) who all work together to provide you with the care you need, when you need it.  Your next appointment:   3 month(s)  The format for your next appointment:   In Person  Provider:   Jenne Campus, MD  Other Instructions

## 2019-05-08 NOTE — Progress Notes (Signed)
Cardiology Office Note:    Date:  05/08/2019   ID:  [name], DOB 01/08/1935, MRN [id number]  PCP:  Lillard Anes, MD  Cardiologist:  Jenne Campus, MD    Referring MD: Lillard Anes,*   Chief Complaint  Patient presents with  . Follow-up  Doing best ever  History of Present Illness:    ANGELDANIEL Ruiz is a 84 y.o. male with complex past medical history which include coronary disease, complete heart block, pacemaker, completely occluded right internal carotid artery, recently he end up going to [facility] because of chest pain.  He was found to have severely anemic requiring blood transfusion, gastroscopy and colonoscopy has been done which revealed gastritis a few polyps those were removed he was discharged home since that time he is doing great he said he feels best ever.  He is able to walk and do things of course he had some debility because of his age but overall feels great.  Past Medical History:  Diagnosis Date  . Arthritis   . Asthma   . Carotid artery disease ([place])    a. h/o occluded R ICA.  Marland Kitchen CHB (complete heart block) (HCC)    a. s/p ppm.  . Chronic anemia   . CKD (chronic kidney disease), stage III   . Coronary artery disease    a. moderate nonobstructive CAD by cath in 2018.  Marland Kitchen GERD (gastroesophageal reflux disease)   . Hypertension   . Hypothyroidism   . Iron deficiency   . PAF (paroxysmal atrial fibrillation) ([place])   . Peripheral vascular disease [facility])     Past Surgical History:  Procedure Laterality Date  . BIOPSY  04/11/2019   Procedure: BIOPSY;  Surgeon: Milus Banister, MD;  Location: [place];  Service: Endoscopy;;  . CATARACT EXTRACTION    . COLONOSCOPY WITH PROPOFOL N/A 04/11/2019   Procedure: COLONOSCOPY WITH PROPOFOL;  Surgeon: Milus Banister, MD;  Location: [facility] ENDOSCOPY;  Service: Endoscopy;  Laterality: N/A;  . ESOPHAGOGASTRODUODENOSCOPY (EGD) WITH PROPOFOL N/A 04/11/2019   Procedure:  ESOPHAGOGASTRODUODENOSCOPY (EGD) WITH PROPOFOL;  Surgeon: Milus Banister, MD;  Location: [facility] ENDOSCOPY;  Service: Endoscopy;  Laterality: N/A;  . HIP SURGERY    . INSERT / REPLACE / REMOVE PACEMAKER     Medtronic  . JOINT REPLACEMENT Right 10/20/1994   Hip  (Left Hip in 10/96)  . LEFT HEART CATH AND CORONARY ANGIOGRAPHY N/A 02/08/2017   Procedure: LEFT HEART CATH AND CORONARY ANGIOGRAPHY;  Surgeon: Martinique, Peter M, MD;  Location: [place] CV LAB;  Service: Cardiovascular;  Laterality: N/A;  . ORIF PERIPROSTHETIC FRACTURE Right 08/17/2017   Procedure: OPEN REDUCTION INTERNAL FIXATION (ORIF) PERIPROSTHETIC FRACTURE RIGHT FEMUR;  Surgeon: Paralee Cancel, MD;  Location: [facility];  Service: Orthopedics;  Laterality: Right;  . ORIF PERIPROSTHETIC FRACTURE Right 01/23/2018   Procedure: Redo open reduction internal fixation right periprosthetic femur fracture;  Surgeon: Paralee Cancel, MD;  Location: [facility];  Service: Orthopedics;  Laterality: Right;  120 mins  . POLYPECTOMY  04/11/2019   Procedure: POLYPECTOMY;  Surgeon: Milus Banister, MD;  Location: [facility] ENDOSCOPY;  Service: Endoscopy;;    Current Medications: Current Meds  Medication Sig  . albuterol (PROVENTIL HFA;VENTOLIN HFA) 108 (90 Base) MCG/ACT inhaler Inhale 2 puffs into the lungs 4 (four) times daily as needed for wheezing or shortness of breath.  Marland Kitchen amLODipine (NORVASC) 5 MG tablet TAKE 1 TABLET(5 MG) BY MOUTH DAILY (Patient taking differently: Take 5 mg by mouth at  bedtime. )  . apixaban (ELIQUIS) 5 MG TABS tablet Take 1 tablet (5 mg total) by mouth 2 (two) times daily.  Marland Kitchen atorvastatin (LIPITOR) 40 MG tablet Take 40 mg by mouth at bedtime.   . Calcium Carbonate-Vitamin D (CALCIUM 500 + D PO) Take 1 tablet by mouth daily.  . fluticasone (FLOVENT HFA) 110 MCG/ACT inhaler Inhale 2 puffs into the lungs daily.   Marland Kitchen HYDROcodone-acetaminophen (NORCO/VICODIN) 5-325 MG tablet Take 1 tablet by mouth 2 (two) times daily as needed (pain).   .  isosorbide mononitrate (IMDUR) 60 MG 24 hr tablet Take 1 tablet (60 mg total) by mouth at bedtime.  Marland Kitchen levothyroxine (SYNTHROID, LEVOTHROID) 50 MCG tablet Take 50 mcg by mouth at bedtime.   . Multiple Vitamin (MULTIVITAMIN WITH MINERALS) TABS tablet Take 1 tablet by mouth daily.  . nitroGLYCERIN (NITROSTAT) 0.4 MG SL tablet DISSOLVE 1 TABLET UNDER THE TONGUE EVERY 5 MINUTES AS NEEDED FOR CHEST PAIN. MAX 3 DOSES IN 15 MINUTES (Patient taking differently: Place 0.4 mg under the tongue every 5 (five) minutes as needed for chest pain (max 3 doses in 15 minutes). )  . omeprazole (PRILOSEC OTC) 20 MG tablet Take 2 tablets (40 mg total) by mouth daily. (Patient taking differently: Take 20 mg by mouth 2 (two) times daily. )  . ranolazine (RANEXA) 500 MG 12 hr tablet TAKE 1 TABLET(500 MG) BY MOUTH TWICE DAILY  . vitamin B-12 (CYANOCOBALAMIN) 1000 MCG tablet Take 1,000 mcg by mouth daily.      Allergies:   Patient has no known allergies.   Social History   Socioeconomic History  . Marital status: Married    Spouse name: Not on file  . Number of children: Not on file  . Years of education: Not on file  . Highest education level: Not on file  Occupational History  . Not on file  Social Needs  . Financial resource strain: Not on file  . Food insecurity    Worry: Not on file    Inability: Not on file  . Transportation needs    Medical: Not on file    Non-medical: Not on file  Tobacco Use  . Smoking status: Never Smoker  . Smokeless tobacco: Never Used  Substance and Sexual Activity  . Alcohol use: Yes    Alcohol/week: 1.0 standard drinks    Types: 1 Cans of beer per week    Comment: occ  . Drug use: No  . Sexual activity: Not on file  Lifestyle  . Physical activity    Days per week: Not on file    Minutes per session: Not on file  . Stress: Not on file  Relationships  . Social Herbalist on phone: Not on file    Gets together: Not on file    Attends religious service: Not  on file    Active member of club or organization: Not on file    Attends meetings of clubs or organizations: Not on file    Relationship status: Not on file  Other Topics Concern  . Not on file  Social History Narrative  . Not on file     Family History: The patient's family history includes Heart attack in his mother. ROS:   Please see the history of present illness.    All 14 point review of systems negative except as described per history of present illness  EKGs/Labs/Other Studies Reviewed:      Recent Labs: 04/09/2019: ALT 12; B Natriuretic  Peptide 286.1; TSH 2.307 04/10/2019: BUN 13; Creatinine, Ser 1.47; Potassium 4.0; Sodium 139 04/12/2019: Hemoglobin 9.3; Platelets 275  Recent Lipid Panel    Component Value Date/Time   CHOL 119 04/10/2019 0448   TRIG 56 04/10/2019 0448   HDL 54 04/10/2019 0448   CHOLHDL 2.2 04/10/2019 0448   VLDL 11 04/10/2019 0448   LDLCALC 54 04/10/2019 0448    Physical Exam:    VS:  BP (!) 162/60   Pulse 86   Ht 5\' 5"  (1.651 m)   Wt 184 lb (83.5 kg)   SpO2 97%   BMI 30.62 kg/m     Wt Readings from Last 3 Encounters:  05/08/19 184 lb (83.5 kg)  04/19/19 185 lb (83.9 kg)  04/12/19 174 lb (78.9 kg)     GEN:  Well nourished, well developed in no acute distress HEENT: Normal NECK: No JVD; No carotid bruits LYMPHATICS: No lymphadenopathy CARDIAC: RRR, no murmurs, no rubs, no gallops RESPIRATORY:  Clear to auscultation without rales, wheezing or rhonchi  ABDOMEN: Soft, non-tender, non-distended MUSCULOSKELETAL:  No edema; No deformity  SKIN: Warm and dry LOWER EXTREMITIES: no swelling NEUROLOGIC:  Alert and oriented x 3 PSYCHIATRIC:  Normal affect   ASSESSMENT:    1. Paroxysmal atrial fibrillation (HCC)   2. Peripheral vascular disease ([place])   3. Essential hypertension   4. Complete heart block ([place])   5. Pacemaker reprogramming/check   6. Late effect of cerebrovascular accident (CVA)   7. Dyslipidemia    PLAN:    In  order of problems listed above:  1. Paroxysmal atrial fibrillation.  He is anticoagulated with Eliquis which I will continue will check his Chem-7 today to make sure that the dose of Eliquis is appropriate. 2. Essential hypertension his blood pressure is elevated today however he said he check it at home and blood pressure is good.  I will continue management for now however we will keep eye on this. 3. Complete heart block this being addressed with a pacemaker followed by our EP team. 4. History of CVA stable on appropriate medications. 5. Dyslipidemia on high intensity statin which I will continue.   Medication Adjustments/Labs and Tests Ordered: Current medicines are reviewed at length with the patient today.  Concerns regarding medicines are outlined above.  No orders of the defined types were placed in this encounter.  Medication changes: No orders of the defined types were placed in this encounter.   Signed, Park Liter, MD, [facility] 05/08/2019 3:08 PM    [place]

## 2019-05-09 LAB — CBC
Hematocrit: 35.8 % — ABNORMAL LOW (ref 37.5–51.0)
Hemoglobin: 11.6 g/dL — ABNORMAL LOW (ref 13.0–17.7)
MCH: 26.9 pg (ref 26.6–33.0)
MCHC: 32.4 g/dL (ref 31.5–35.7)
MCV: 83 fL (ref 79–97)
Platelets: 217 10*3/uL (ref 150–450)
RBC: 4.32 x10E6/uL (ref 4.14–5.80)
RDW: 21.1 % — ABNORMAL HIGH (ref 11.6–15.4)
WBC: 5.2 10*3/uL (ref 3.4–10.8)

## 2019-05-09 LAB — BASIC METABOLIC PANEL
BUN/Creatinine Ratio: 12 (ref 10–24)
BUN: 14 mg/dL (ref 8–27)
CO2: 19 mmol/L — ABNORMAL LOW (ref 20–29)
Calcium: 9.1 mg/dL (ref 8.6–10.2)
Chloride: 103 mmol/L (ref 96–106)
Creatinine, Ser: 1.21 mg/dL (ref 0.76–1.27)
GFR calc Af Amer: 64 mL/min/{1.73_m2} (ref >59)
GFR calc non Af Amer: 55 mL/min/{1.73_m2} — ABNORMAL LOW (ref >59)
Glucose: 109 mg/dL — ABNORMAL HIGH (ref 65–99)
Potassium: 4.1 mmol/L (ref 3.5–5.2)
Sodium: 140 mmol/L (ref 134–144)

## 2019-05-21 ENCOUNTER — Ambulatory Visit (INDEPENDENT_AMBULATORY_CARE_PROVIDER_SITE_OTHER): Payer: PPO | Admitting: *Deleted

## 2019-05-21 DIAGNOSIS — I442 Atrioventricular block, complete: Secondary | ICD-10-CM

## 2019-05-21 LAB — CUP PACEART REMOTE DEVICE CHECK
Battery Impedance: 622 Ohm
Battery Remaining Longevity: 70 mo
Battery Voltage: 2.78 V
Brady Statistic AP VP Percent: 15 %
Brady Statistic AP VS Percent: 0 %
Brady Statistic AS VP Percent: 85 %
Brady Statistic AS VS Percent: 0 %
Date Time Interrogation Session: 20201130100809
Implantable Lead Implant Date: 20150319
Implantable Lead Implant Date: 20150319
Implantable Lead Location: 753859
Implantable Lead Location: 753860
Implantable Lead Model: 4092
Implantable Lead Model: 5076
Implantable Pulse Generator Implant Date: 20150319
Lead Channel Impedance Value: 408 Ohm
Lead Channel Impedance Value: 543 Ohm
Lead Channel Pacing Threshold Amplitude: 0.875 V
Lead Channel Pacing Threshold Amplitude: 1.5 V
Lead Channel Pacing Threshold Pulse Width: 0.4 ms
Lead Channel Pacing Threshold Pulse Width: 0.4 ms
Lead Channel Setting Pacing Amplitude: 1.75 V
Lead Channel Setting Pacing Amplitude: 3 V
Lead Channel Setting Pacing Pulse Width: 0.4 ms
Lead Channel Setting Sensing Sensitivity: 2 mV

## 2019-05-28 ENCOUNTER — Ambulatory Visit: Payer: PPO | Admitting: Gastroenterology

## 2019-06-13 NOTE — Progress Notes (Signed)
PPM remote 

## 2019-06-18 DIAGNOSIS — M25521 Pain in right elbow: Secondary | ICD-10-CM | POA: Diagnosis not present

## 2019-06-18 DIAGNOSIS — S52591D Other fractures of lower end of right radius, subsequent encounter for closed fracture with routine healing: Secondary | ICD-10-CM | POA: Diagnosis not present

## 2019-06-18 DIAGNOSIS — S59291A Other physeal fracture of lower end of radius, right arm, initial encounter for closed fracture: Secondary | ICD-10-CM | POA: Diagnosis not present

## 2019-06-18 DIAGNOSIS — M71521 Other bursitis, not elsewhere classified, right elbow: Secondary | ICD-10-CM | POA: Diagnosis not present

## 2019-06-18 DIAGNOSIS — Z1159 Encounter for screening for other viral diseases: Secondary | ICD-10-CM | POA: Diagnosis not present

## 2019-06-18 DIAGNOSIS — S59901A Unspecified injury of right elbow, initial encounter: Secondary | ICD-10-CM | POA: Diagnosis not present

## 2019-06-18 DIAGNOSIS — S6291XA Unspecified fracture of right wrist and hand, initial encounter for closed fracture: Secondary | ICD-10-CM | POA: Diagnosis not present

## 2019-06-18 DIAGNOSIS — Z6830 Body mass index (BMI) 30.0-30.9, adult: Secondary | ICD-10-CM | POA: Diagnosis not present

## 2019-08-01 ENCOUNTER — Other Ambulatory Visit: Payer: Self-pay | Admitting: Cardiology

## 2019-08-02 ENCOUNTER — Other Ambulatory Visit: Payer: Self-pay | Admitting: *Deleted

## 2019-08-02 ENCOUNTER — Telehealth: Payer: Self-pay | Admitting: Cardiology

## 2019-08-02 MED ORDER — NITROGLYCERIN 0.4 MG SL SUBL: SUBLINGUAL_TABLET | SUBLINGUAL | 3 refills | Status: DC

## 2019-08-02 NOTE — Telephone Encounter (Signed)
*  STAT* If patient is at the pharmacy, call can be transferred to refill team.   1. Which medications need to be refilled? (please list name of each medication and dose if known) nitroGLYCERIN (NITROSTAT) 0.4 MG SL tablet  2. Which pharmacy/location (including street and city if local pharmacy) is medication to be sent to? [id number] - [place],  - [address] AT [address]  3. Do they need a 30 day or 90 day supply? Wife stated she wanted 66-75 supply for husband.

## 2019-08-02 NOTE — Telephone Encounter (Signed)
Refill sent.

## 2019-08-07 ENCOUNTER — Telehealth: Payer: Self-pay | Admitting: Cardiology

## 2019-08-07 NOTE — Telephone Encounter (Signed)
Spoke with patient's spouse. Patient has difficulty hearing and remembering things. He gets information "all tangled up." Spouse given permission to attend appointment tomorrow at 1pm.

## 2019-08-07 NOTE — Telephone Encounter (Signed)
New Message    pts wife is calling and says she will need to assist the pt to his appt because he doesn't hear well     Please advise

## 2019-08-08 ENCOUNTER — Ambulatory Visit (INDEPENDENT_AMBULATORY_CARE_PROVIDER_SITE_OTHER): Payer: PPO | Admitting: Cardiology

## 2019-08-08 ENCOUNTER — Encounter: Payer: Self-pay | Admitting: Cardiology

## 2019-08-08 ENCOUNTER — Other Ambulatory Visit: Payer: Self-pay

## 2019-08-08 VITALS — BP 156/60 | HR 82 | Ht 65.0 in | Wt 187.0 lb

## 2019-08-08 DIAGNOSIS — Z950 Presence of cardiac pacemaker: Secondary | ICD-10-CM

## 2019-08-08 DIAGNOSIS — I442 Atrioventricular block, complete: Secondary | ICD-10-CM

## 2019-08-08 DIAGNOSIS — I480 Paroxysmal atrial fibrillation: Secondary | ICD-10-CM | POA: Diagnosis not present

## 2019-08-08 DIAGNOSIS — I6930 Unspecified sequelae of cerebral infarction: Secondary | ICD-10-CM

## 2019-08-08 DIAGNOSIS — I10 Essential (primary) hypertension: Secondary | ICD-10-CM

## 2019-08-08 HISTORY — DX: Presence of cardiac pacemaker: Z95.0

## 2019-08-08 NOTE — Patient Instructions (Signed)

## 2019-08-08 NOTE — Progress Notes (Signed)
Cardiology Office Note:    Date:  08/08/2019   ID:  [name], DOB 1935/04/18, MRN [id number]  PCP:  Lillard Anes, MD  Cardiologist:  Jenne Campus, MD    Referring MD: Lillard Anes,*   No chief complaint on file. Doing well  History of Present Illness:    [name] is a 85 y.o. male with past medical history significant for remote coronary artery disease, complete heart block required pacing, completely occluded right intracardiac artery, paroxysmal atrial fibrillation on anticoagulation. Comes today to my office for follow-up. Overall she is doing well however he admits to be very sedentary especially now in the wintertime. Denies having any chest pain, tightness, pressure, burning in the chest. No dizziness no passing out. No evidence of bleeding.  Past Medical History:  Diagnosis Date  . Arthritis   . Asthma   . Carotid artery disease ([place])    a. h/o occluded R ICA.  Marland Kitchen CHB (complete heart block) (HCC)    a. s/p ppm.  . Chronic anemia   . CKD (chronic kidney disease), stage III   . Coronary artery disease    a. moderate nonobstructive CAD by cath in 2018.  Marland Kitchen GERD (gastroesophageal reflux disease)   . Hypertension   . Hypothyroidism   . Iron deficiency   . PAF (paroxysmal atrial fibrillation) ([place])   . Peripheral vascular disease [facility])     Past Surgical History:  Procedure Laterality Date  . BIOPSY  04/11/2019   Procedure: BIOPSY;  Surgeon: Milus Banister, MD;  Location: [place];  Service: Endoscopy;;  . CATARACT EXTRACTION    . COLONOSCOPY WITH PROPOFOL N/A 04/11/2019   Procedure: COLONOSCOPY WITH PROPOFOL;  Surgeon: Milus Banister, MD;  Location: [facility] ENDOSCOPY;  Service: Endoscopy;  Laterality: N/A;  . ESOPHAGOGASTRODUODENOSCOPY (EGD) WITH PROPOFOL N/A 04/11/2019   Procedure: ESOPHAGOGASTRODUODENOSCOPY (EGD) WITH PROPOFOL;  Surgeon: Milus Banister, MD;  Location: [facility] ENDOSCOPY;  Service: Endoscopy;  Laterality: N/A;  .  HIP SURGERY    . INSERT / REPLACE / REMOVE PACEMAKER     [employer]  . JOINT REPLACEMENT Right 10/20/1994   Hip  (Left Hip in 10/96)  . LEFT HEART CATH AND CORONARY ANGIOGRAPHY N/A 02/08/2017   Procedure: LEFT HEART CATH AND CORONARY ANGIOGRAPHY;  Surgeon: Martinique, Peter M, MD;  Location: [place] CV LAB;  Service: Cardiovascular;  Laterality: N/A;  . ORIF PERIPROSTHETIC FRACTURE Right 08/17/2017   Procedure: OPEN REDUCTION INTERNAL FIXATION (ORIF) PERIPROSTHETIC FRACTURE RIGHT FEMUR;  Surgeon: Paralee Cancel, MD;  Location: [facility];  Service: Orthopedics;  Laterality: Right;  . ORIF PERIPROSTHETIC FRACTURE Right 01/23/2018   Procedure: Redo open reduction internal fixation right periprosthetic femur fracture;  Surgeon: Paralee Cancel, MD;  Location: [facility];  Service: Orthopedics;  Laterality: Right;  120 mins  . POLYPECTOMY  04/11/2019   Procedure: POLYPECTOMY;  Surgeon: Milus Banister, MD;  Location: [facility] ENDOSCOPY;  Service: Endoscopy;;    Current Medications: No outpatient medications have been marked as taking for the 08/08/19 encounter (Office Visit) with Park Liter, MD.     Allergies:   Patient has no known allergies.   Social History   Socioeconomic History  . Marital status: Married    Spouse name: Not on file  . Number of children: Not on file  . Years of education: Not on file  . Highest education level: Not on file  Occupational History  . Not on file  Tobacco Use  . Smoking status:  Never Smoker  . Smokeless tobacco: Never Used  Substance and Sexual Activity  . Alcohol use: Yes    Alcohol/week: 1.0 standard drinks    Types: 1 Cans of beer per week    Comment: occ  . Drug use: No  . Sexual activity: Not on file  Other Topics Concern  . Not on file  Social History Narrative  . Not on file   Social Determinants of Health   Financial Resource Strain:   . Difficulty of Paying Living Expenses: Not on file  Food Insecurity:   . Worried About Sales executive in the Last Year: Not on file  . Ran Out of Food in the Last Year: Not on file  Transportation Needs:   . Lack of Transportation (Medical): Not on file  . Lack of Transportation (Non-Medical): Not on file  Physical Activity:   . Days of Exercise per Week: Not on file  . Minutes of Exercise per Session: Not on file  Stress:   . Feeling of Stress : Not on file  Social Connections:   . Frequency of Communication with Friends and Family: Not on file  . Frequency of Social Gatherings with Friends and Family: Not on file  . Attends Religious Services: Not on file  . Active Member of Clubs or Organizations: Not on file  . Attends Archivist Meetings: Not on file  . Marital Status: Not on file     Family History: The patient's family history includes Heart attack in his mother. ROS:   Please see the history of present illness.    All 14 point review of systems negative except as described per history of present illness  EKGs/Labs/Other Studies Reviewed:      Recent Labs: 04/09/2019: ALT 12; B Natriuretic Peptide 286.1; TSH 2.307 05/08/2019: BUN 14; Creatinine, Ser 1.21; Hemoglobin 11.6; Platelets 217; Potassium 4.1; Sodium 140  Recent Lipid Panel    Component Value Date/Time   CHOL 119 04/10/2019 0448   TRIG 56 04/10/2019 0448   HDL 54 04/10/2019 0448   CHOLHDL 2.2 04/10/2019 0448   VLDL 11 04/10/2019 0448   LDLCALC 54 04/10/2019 0448    Physical Exam:    VS:  BP (!) 156/60   Pulse 82   Ht 5\' 5"  (1.651 m)   Wt 187 lb (84.8 kg)   SpO2 96%   BMI 31.12 kg/m     Wt Readings from Last 3 Encounters:  08/08/19 187 lb (84.8 kg)  05/08/19 184 lb (83.5 kg)  04/19/19 185 lb (83.9 kg)     GEN:  Well nourished, well developed in no acute distress HEENT: Normal NECK: No JVD; No carotid bruits LYMPHATICS: No lymphadenopathy CARDIAC: RRR, soft systolic murmur grade 1/6 best heard at the right upper portion of the sternum, no rubs, no gallops RESPIRATORY:  Clear  to auscultation without rales, wheezing or rhonchi  ABDOMEN: Soft, non-tender, non-distended MUSCULOSKELETAL:  No edema; No deformity  SKIN: Warm and dry LOWER EXTREMITIES: no swelling NEUROLOGIC:  Alert and oriented x 3 PSYCHIATRIC:  Normal affect   ASSESSMENT:    1. Paroxysmal atrial fibrillation (HCC)   2. Complete heart block ([place])   3. Essential hypertension   4. Late effect of cerebrovascular accident (CVA)   5. Pacemaker    PLAN:    In order of problems listed above:  1. Paroxysmal atrial fibrillation appears to be in sinus rhythm. Placement interrogation reviewed. Last episode of prolonged episode of atrial fibrillation lasting  for up to 12 hours was in the summer. He is anticoagulated which I will continue. 2 days from now he does have appoint with his primary care physician. We will get CBC there. 2. Complete heart block that being addressed with a pacemaker. I did review interrogation normal function continue monitoring 3. Essential hypertension ask him to check his blood pressure at home and bring it to me within next few weeks. 4. Late effect of CVA stable 5. Pacemaker noted interrogation reviewed.   Medication Adjustments/Labs and Tests Ordered: Current medicines are reviewed at length with the patient today.  Concerns regarding medicines are outlined above.  No orders of the defined types were placed in this encounter.  Medication changes: No orders of the defined types were placed in this encounter.   Signed, Park Liter, MD, [facility] 08/08/2019 1:36 PM    [place]

## 2019-08-09 ENCOUNTER — Other Ambulatory Visit: Payer: Self-pay

## 2019-08-09 MED ORDER — HYDROCODONE-ACETAMINOPHEN 5-325 MG PO TABS: 1.0000 | ORAL_TABLET | Freq: Two times a day (BID) | ORAL | 0 refills | Status: DC | PRN

## 2019-08-10 ENCOUNTER — Ambulatory Visit: Payer: PPO | Admitting: Legal Medicine

## 2019-08-17 ENCOUNTER — Other Ambulatory Visit: Payer: Self-pay

## 2019-08-17 ENCOUNTER — Ambulatory Visit (INDEPENDENT_AMBULATORY_CARE_PROVIDER_SITE_OTHER): Payer: PPO | Admitting: Legal Medicine

## 2019-08-17 ENCOUNTER — Encounter: Payer: Self-pay | Admitting: Legal Medicine

## 2019-08-17 VITALS — BP 146/80 | HR 83 | Temp 97.3°F | Ht 62.84 in | Wt 186.0 lb

## 2019-08-17 DIAGNOSIS — I10 Essential (primary) hypertension: Secondary | ICD-10-CM

## 2019-08-17 DIAGNOSIS — C61 Malignant neoplasm of prostate: Secondary | ICD-10-CM | POA: Insufficient documentation

## 2019-08-17 DIAGNOSIS — E039 Hypothyroidism, unspecified: Secondary | ICD-10-CM | POA: Diagnosis not present

## 2019-08-17 DIAGNOSIS — E785 Hyperlipidemia, unspecified: Secondary | ICD-10-CM | POA: Diagnosis not present

## 2019-08-17 DIAGNOSIS — K2100 Gastro-esophageal reflux disease with esophagitis, without bleeding: Secondary | ICD-10-CM | POA: Diagnosis not present

## 2019-08-17 DIAGNOSIS — M170 Bilateral primary osteoarthritis of knee: Secondary | ICD-10-CM

## 2019-08-17 DIAGNOSIS — I2510 Atherosclerotic heart disease of native coronary artery without angina pectoris: Secondary | ICD-10-CM | POA: Diagnosis not present

## 2019-08-17 DIAGNOSIS — N1831 Chronic kidney disease, stage 3a: Secondary | ICD-10-CM

## 2019-08-17 DIAGNOSIS — I5043 Acute on chronic combined systolic (congestive) and diastolic (congestive) heart failure: Secondary | ICD-10-CM

## 2019-08-17 HISTORY — DX: Bilateral primary osteoarthritis of knee: M17.0

## 2019-08-17 HISTORY — DX: Malignant neoplasm of prostate: C61

## 2019-08-17 NOTE — Progress Notes (Signed)
Established Patient Office Visit  Subjective:  Patient ID: [name], male    DOB: [birth year]  Age: 85 y.o. MRN: [id number]  CC:  Chief Complaint  Patient presents with  . Hypothyroidism  . Hypertension  . Asthma  . Hyperlipidemia    HPI [name] presents for chronic visit.  Hypothyroidism stable on replacement, levothroid 66mcg.  Patient presents for follow up of hypertension.  Patient tolerating amlodipine, well with side effects.  Patient was diagnosed with hypertension 2010 so has been treated for hypertension for 10 years.Patient is working on maintaining diet and exercise regimen and follows up as directed. Complication include CAD  Patient presents with hyperlipidemia.  Compliance with treatment has been good; patient takes medicines as directed, maintains low cholesterol diet, follows up as directed, and maintains exercise regimen.  Patient is using atorvastatin without problems..  Patient presents with diagnosis of COPD.  It is secondary to prolonged asthma.  Diagnosis 20  Treatment includes albuterol HFA.  The diagnosis has not been hospitalized for this diagnosis. Last na.  Patient is compliant with regular use of medicines.  Past Medical History:  Diagnosis Date  . Arthritis   . Asthma   . Carotid artery disease ([place])    a. h/o occluded R ICA.  Marland Kitchen CHB (complete heart block) (HCC)    a. s/p ppm.  . Chronic anemia   . CKD (chronic kidney disease), stage III   . Coronary artery disease    a. moderate nonobstructive CAD by cath in 2018.  Marland Kitchen GERD (gastroesophageal reflux disease)   . Hypertension   . Hypothyroidism   . Iron deficiency   . PAF (paroxysmal atrial fibrillation) ([place])   . Peripheral vascular disease [facility])     Past Surgical History:  Procedure Laterality Date  . BIOPSY  04/11/2019   Procedure: BIOPSY;  Surgeon: Milus Banister, MD;  Location: [place];  Service: Endoscopy;;  . CATARACT EXTRACTION    . COLONOSCOPY WITH PROPOFOL  N/A 04/11/2019   Procedure: COLONOSCOPY WITH PROPOFOL;  Surgeon: Milus Banister, MD;  Location: [facility] ENDOSCOPY;  Service: Endoscopy;  Laterality: N/A;  . ESOPHAGOGASTRODUODENOSCOPY (EGD) WITH PROPOFOL N/A 04/11/2019   Procedure: ESOPHAGOGASTRODUODENOSCOPY (EGD) WITH PROPOFOL;  Surgeon: Milus Banister, MD;  Location: [facility] ENDOSCOPY;  Service: Endoscopy;  Laterality: N/A;  . HIP SURGERY    . INSERT / REPLACE / REMOVE PACEMAKER     [employer]  . JOINT REPLACEMENT Right 10/20/1994   Hip  (Left Hip in 10/96)  . LEFT HEART CATH AND CORONARY ANGIOGRAPHY N/A 02/08/2017   Procedure: LEFT HEART CATH AND CORONARY ANGIOGRAPHY;  Surgeon: Martinique, Peter M, MD;  Location: [place] CV LAB;  Service: Cardiovascular;  Laterality: N/A;  . ORIF PERIPROSTHETIC FRACTURE Right 08/17/2017   Procedure: OPEN REDUCTION INTERNAL FIXATION (ORIF) PERIPROSTHETIC FRACTURE RIGHT FEMUR;  Surgeon: Paralee Cancel, MD;  [facility];  Service: Orthopedics;  Laterality: Right;  . ORIF PERIPROSTHETIC FRACTURE Right 01/23/2018   Procedure: Redo open reduction internal fixation right periprosthetic femur fracture;  Surgeon: Paralee Cancel, MD;  Location[facility];  Service: Orthopedics;  Laterality: Right;  120 mins  . POLYPECTOMY  04/11/2019   Procedure: POLYPECTOMY;  Surgeon: Milus Banister, MD;  Location: [facility] ENDOSCOPY;  Service: Endoscopy;;    Family History  Problem Relation Age of Onset  . Heart attack Mother     Social History   Socioeconomic History  . Marital status: Married    Spouse name: Not on file  .  Number of children: 2  . Years of education: Not on file  . Highest education level: 6th grade  Occupational History  . Occupation: Retired  Tobacco Use  . Smoking status: Never Smoker  . Smokeless tobacco: Current User    Types: Chew  . Tobacco comment: 1 can per day  Substance and Sexual Activity  . Alcohol use: Yes    Alcohol/week: 1.0 standard drinks    Types: 1 Cans of beer per week    Comment:  regular use 4 times a week  . Drug use: No  . Sexual activity: Not Currently    Partners: Female    Birth control/protection: None  Other Topics Concern  . Not on file  Social History Narrative  . Not on file   Social Determinants of Health   Financial Resource Strain:   . Difficulty of Paying Living Expenses: Not on file  Food Insecurity:   . Worried About Charity fundraiser in the Last Year: Not on file  . Ran Out of Food in the Last Year: Not on file  Transportation Needs:   . Lack of Transportation (Medical): Not on file  . Lack of Transportation (Non-Medical): Not on file  Physical Activity:   . Days of Exercise per Week: Not on file  . Minutes of Exercise per Session: Not on file  Stress:   . Feeling of Stress : Not on file  Social Connections:   . Frequency of Communication with Friends and Family: Not on file  . Frequency of Social Gatherings with Friends and Family: Not on file  . Attends Religious Services: Not on file  . Active Member of Clubs or Organizations: Not on file  . Attends Archivist Meetings: Not on file  . Marital Status: Not on file  Intimate Partner Violence:   . Fear of Current or Ex-Partner: Not on file  . Emotionally Abused: Not on file  . Physically Abused: Not on file  . Sexually Abused: Not on file    Outpatient Medications Prior to Visit  Medication Sig Dispense Refill  . amLODipine (NORVASC) 5 MG tablet TAKE 1 TABLET(5 MG) BY MOUTH DAILY (Patient taking differently: Take 5 mg by mouth at bedtime. ) 90 tablet 0  . apixaban (ELIQUIS) 5 MG TABS tablet Take 1 tablet (5 mg total) by mouth 2 (two) times daily. 60 tablet 6  . atorvastatin (LIPITOR) 40 MG tablet Take 40 mg by mouth at bedtime.     . Calcium Carbonate-Vitamin D (CALCIUM 500 + D PO) Take 1 tablet by mouth daily.    . FEROSUL 325 (65 Fe) MG tablet Take 325 mg by mouth 2 (two) times daily.    . fluticasone (FLOVENT HFA) 110 MCG/ACT inhaler Inhale 2 puffs into the lungs  daily.     Marland Kitchen HYDROcodone-acetaminophen (NORCO/VICODIN) 5-325 MG tablet Take 1 tablet by mouth 2 (two) times daily as needed (pain). 60 tablet 0  . isosorbide mononitrate (IMDUR) 60 MG 24 hr tablet Take 1 tablet (60 mg total) by mouth at bedtime. 90 tablet 1  . levothyroxine (SYNTHROID, LEVOTHROID) 50 MCG tablet Take 50 mcg by mouth at bedtime.     . Multiple Vitamin (MULTIVITAMIN WITH MINERALS) TABS tablet Take 1 tablet by mouth daily.    . nitroGLYCERIN (NITROSTAT) 0.4 MG SL tablet DISSOLVE 1 TABLET UNDER THE TONGUE EVERY 5 MINUTES AS NEEDED FOR CHEST PAIN. MAX 3 DOSES IN 15 MINUTES 25 tablet 3  . omeprazole (PRILOSEC) 20 MG capsule  Take 20 mg by mouth daily.    . ranolazine (RANEXA) 500 MG 12 hr tablet TAKE 1 TABLET(500 MG) BY MOUTH TWICE DAILY 180 tablet 2  . vitamin B-12 (CYANOCOBALAMIN) 1000 MCG tablet Take 1,000 mcg by mouth daily.     Marland Kitchen omeprazole (PRILOSEC OTC) 20 MG tablet Take 2 tablets (40 mg total) by mouth daily. (Patient taking differently: Take 20 mg by mouth 2 (two) times daily. ) 30 tablet 0  . albuterol (PROVENTIL HFA;VENTOLIN HFA) 108 (90 Base) MCG/ACT inhaler Inhale 2 puffs into the lungs 4 (four) times daily as needed for wheezing or shortness of breath.     No facility-administered medications prior to visit.    No Known Allergies  ROS Review of Systems  Constitutional: Negative.   HENT: Positive for hearing loss.   Eyes: Negative.   Respiratory: Negative.   Cardiovascular: Negative.   Gastrointestinal: Negative.   Endocrine: Negative.   Genitourinary: Negative.   Musculoskeletal: Negative.   Skin: Negative.   Psychiatric/Behavioral: Negative.       Objective:    Physical Exam  Constitutional: He is oriented to person, place, and time. He appears well-developed and well-nourished.  HENT:  Head: Normocephalic and atraumatic.  Eyes: Pupils are equal, round, and reactive to light. Conjunctivae and EOM are normal.  Cardiovascular: Normal rate, regular rhythm  and normal heart sounds.  Pulmonary/Chest: Effort normal and breath sounds normal.  Abdominal: Soft. Bowel sounds are normal.  Musculoskeletal:        General: Normal range of motion.     Cervical back: Normal range of motion and neck supple.  Neurological: He is alert and oriented to person, place, and time. He has normal reflexes.  Skin: Skin is warm and dry.  Psychiatric: He has a normal mood and affect.  Vitals reviewed.   BP (!) 146/80 (BP Location: Left Arm)   Pulse 83   Temp (!) 97.3 F (36.3 C) (Temporal)   Ht 5' 2.84" (1.596 m)   Wt 186 lb (84.4 kg)   SpO2 96%   BMI 33.12 kg/m  Wt Readings from Last 3 Encounters:  08/17/19 186 lb (84.4 kg)  08/08/19 187 lb (84.8 kg)  05/08/19 184 lb (83.5 kg)     Health Maintenance Due  Topic Date Due  . TETANUS/TDAP  03/27/1956  . PNA vac Low Risk Adult (1 of 2 - PCV13) 03/27/2002  . INFLUENZA VACCINE  01/20/2019    There are no preventive care reminders to display for this patient.  Lab Results  Component Value Date   TSH 2.580 08/17/2019   Lab Results  Component Value Date   WBC 5.2 08/17/2019   HGB 12.4 (L) 08/17/2019   HCT 36.8 (L) 08/17/2019   MCV 94 08/17/2019   PLT 202 08/17/2019   Lab Results  Component Value Date   NA 140 08/17/2019   K 4.3 08/17/2019   CO2 24 08/17/2019   GLUCOSE 94 08/17/2019   BUN 13 08/17/2019   CREATININE 1.20 08/17/2019   BILITOT 0.5 08/17/2019   ALKPHOS 79 08/17/2019   AST 21 08/17/2019   ALT 10 08/17/2019   PROT 6.6 08/17/2019   ALBUMIN 4.2 08/17/2019   CALCIUM 9.4 08/17/2019   ANIONGAP 10 04/10/2019   Lab Results  Component Value Date   CHOL 152 08/17/2019   Lab Results  Component Value Date   HDL 71 08/17/2019   Lab Results  Component Value Date   LDLCALC 67 08/17/2019   Lab Results  Component  Value Date   TRIG 73 08/17/2019   Lab Results  Component Value Date   CHOLHDL 2.1 08/17/2019   Lab Results  Component Value Date   HGBA1C  07/07/2008     5.8 (NOTE)   The ADA recommends the following therapeutic goal for glycemic   control related to Hgb A1C measurement:   Goal of Therapy:   < 7.0% Hgb A1C   Reference: American Diabetes Association: Clinical Practice   Recommendations 2008, Diabetes Care,  2008, 31:(Suppl 1).      Assessment & Plan:   Problem List Items Addressed This Visit      Cardiovascular and Mediastinum   Essential hypertension - Primary    An individual care plan was established and reinforced today.  The patient's status was assessed using clinical findings on exam and labs or diagnostic tests. The patient's success at meeting treatment goals on disease specific evidence-based guidelines and found to be well controlled. SELF MANAGEMENT: The patient and I together assessed ways to personally work towards obtaining the recommended goals. RECOMMENDATIONS: avoid decongestants found in common cold remedies, decrease consumption of alcohol, perform routine monitoring of BP with home BP cuff, exercise, reduction of dietary salt, take medicines as prescribed, try not to miss doses and quit smoking.  Regular exercise and maintaining a healthy weight is needed.  Stress reduction may help. A CLINICAL SUMMARY including written plan identify barriers to care unique to individual due to social or financial issues.  We attempt to mutually creat solutions for individual and family understanding.      Relevant Orders   CBC with Differential (Completed)   Comprehensive metabolic panel (Completed)   Coronary atherosclerosis of native coronary artery    An individual plan was formulated based on patient history and exam, labs and evidence based data. Patient has not had recent angina or nitroglycerin use. Continue present treatment.      Acute on chronic combined systolic and diastolic CHF (congestive heart failure) ([place])    An individualized care plan was established and reinforced.  The patient's disease status was assessed using  clinical finding son exam today, labs, and/or other diagnostic testing such as x-rays, to determine the patient's success in meeting treatmentgoalsbased on disease-based guidelines and found to beimproving. But not at goal yet. Medications prescriptions no change Laboratory tests ordered to be performed today include lab. RECOMMENDATIONS: given include see cardiology.  Call physician is patient gains 3 lbs in one day or 5 lbs for one week.  Call for progressive PND, orthopnea or increased pedal edema.        Endocrine   Hypothyroidism    AN INDIVIDUAL CARE PLAN was established and reinforced today.  The patient's status was assessed using clinical findings on exam, labs, and other diagnostic testing. Patient's success at meeting treatment goals based on disease specific evidence-bassed guidelines and found to be in good control. RECOMMENDATIONS include maintaining present medicines and treatment.      Relevant Orders   TSH (Completed)     Musculoskeletal and Integument   Osteoarthritis of both knees    AN INDIVIDUAL CARE PLAN was established and reinforced today.  The patient's status was assessed using clinical findings on exam, labs, and other diagnostic testing. Patient's success at meeting treatment goals based on disease specific evidence-bassed guidelines and found to be in fair control. RECOMMENDATIONS include maintaining present medicines and treatment.        Genitourinary   CKD (chronic kidney disease), stage III    AN  INDIVIDUAL CARE PLAN was established and reinforced today.  The patient's status was assessed using clinical findings on exam, labs, and other diagnostic testing. Patient's success at meeting treatment goals based on disease specific evidence-bassed guidelines and found to be in good control. RECOMMENDATIONS include maintaining present medicines and treatment. Keep BP under control.      Malignant neoplasm of prostate [facility])    Patient is stable and seeing  urology.        Other   Dyslipidemia    AN INDIVIDUAL CARE PLAN was established and reinforced today.  The patient's status was assessed using clinical findings on exam, lab and other diagnostic tests. The patient's disease status was assessed based on evidence-based guidelines and found to be good controlled. MEDICATIONS were reviewed lp. SELF MANAGEMENT GOALS have been discussed and patient's success at attaining the goal of low cholesterol was assessed. RECOMMENDATION given include regular exercise 3 days a week and low cholesterol/low fat diet. CLINICAL SUMMARY including written plan to identify barriers unique to the patient due to social or economic  reasons was discussed.      Relevant Orders   Lipid Panel (Completed)      No orders of the defined types were placed in this encounter.   Follow-up: Return in about 4 months (around 12/15/2019) for fasting.    Reinaldo Meeker, MD

## 2019-08-18 LAB — CBC WITH DIFFERENTIAL/PLATELET
Basophils Absolute: 0.1 10*3/uL (ref 0.0–0.2)
Basos: 1 %
EOS (ABSOLUTE): 0.3 10*3/uL (ref 0.0–0.4)
Eos: 6 %
Hematocrit: 36.8 % — ABNORMAL LOW (ref 37.5–51.0)
Hemoglobin: 12.4 g/dL — ABNORMAL LOW (ref 13.0–17.7)
Immature Grans (Abs): 0 10*3/uL (ref 0.0–0.1)
Immature Granulocytes: 0 %
Lymphocytes Absolute: 1.2 10*3/uL (ref 0.7–3.1)
Lymphs: 22 %
MCH: 31.8 pg (ref 26.6–33.0)
MCHC: 33.7 g/dL (ref 31.5–35.7)
MCV: 94 fL (ref 79–97)
Monocytes Absolute: 0.8 10*3/uL (ref 0.1–0.9)
Monocytes: 14 %
Neutrophils Absolute: 2.9 10*3/uL (ref 1.4–7.0)
Neutrophils: 57 %
Platelets: 202 10*3/uL (ref 150–450)
RBC: 3.9 x10E6/uL — ABNORMAL LOW (ref 4.14–5.80)
RDW: 12.1 % (ref 11.6–15.4)
WBC: 5.2 10*3/uL (ref 3.4–10.8)

## 2019-08-18 LAB — COMPREHENSIVE METABOLIC PANEL
ALT: 10 IU/L (ref 0–44)
AST: 21 IU/L (ref 0–40)
Albumin/Globulin Ratio: 1.8 (ref 1.2–2.2)
Albumin: 4.2 g/dL (ref 3.6–4.6)
Alkaline Phosphatase: 79 IU/L (ref 39–117)
BUN/Creatinine Ratio: 11 (ref 10–24)
BUN: 13 mg/dL (ref 8–27)
Bilirubin Total: 0.5 mg/dL (ref 0.0–1.2)
CO2: 24 mmol/L (ref 20–29)
Calcium: 9.4 mg/dL (ref 8.6–10.2)
Chloride: 103 mmol/L (ref 96–106)
Creatinine, Ser: 1.2 mg/dL (ref 0.76–1.27)
GFR calc Af Amer: 65 mL/min/{1.73_m2} (ref >59)
GFR calc non Af Amer: 56 mL/min/{1.73_m2} — ABNORMAL LOW (ref >59)
Globulin, Total: 2.4 g/dL (ref 1.5–4.5)
Glucose: 94 mg/dL (ref 65–99)
Potassium: 4.3 mmol/L (ref 3.5–5.2)
Sodium: 140 mmol/L (ref 134–144)
Total Protein: 6.6 g/dL (ref 6.0–8.5)

## 2019-08-18 LAB — LIPID PANEL
Chol/HDL Ratio: 2.1 ratio (ref 0.0–5.0)
Cholesterol, Total: 152 mg/dL (ref 100–199)
HDL: 71 mg/dL (ref >39)
LDL Chol Calc (NIH): 67 mg/dL (ref 0–99)
Triglycerides: 73 mg/dL (ref 0–149)
VLDL Cholesterol Cal: 14 mg/dL (ref 5–40)

## 2019-08-18 LAB — TSH: TSH: 2.58 u[IU]/mL (ref 0.450–4.500)

## 2019-08-18 LAB — CARDIOVASCULAR RISK ASSESSMENT

## 2019-08-19 NOTE — Assessment & Plan Note (Signed)
AN INDIVIDUAL CARE PLAN was established and reinforced today.  The patient's status was assessed using clinical findings on exam, labs, and other diagnostic testing. Patient's success at meeting treatment goals based on disease specific evidence-bassed guidelines and found to be in good control. RECOMMENDATIONS include maintaining present medicines and treatment. Keep BP under control.

## 2019-08-19 NOTE — Assessment & Plan Note (Signed)
Patient is stable and seeing urology.

## 2019-08-19 NOTE — Assessment & Plan Note (Signed)
An individual plan was formulated based on patient history and exam, labs and evidence based data. Patient has not had recent angina or nitroglycerin use. Continue present treatment.

## 2019-08-19 NOTE — Progress Notes (Signed)
Anemia improving hemoglobin now 12.4, kidnye tests stable, liver tests normal, cholesterol normal, TSH normal,  lp

## 2019-08-19 NOTE — Assessment & Plan Note (Signed)
An individualized care plan was established and reinforced.  The patient's disease status was assessed using clinical finding son exam today, labs, and/or other diagnostic testing such as x-rays, to determine the patient's success in meeting treatmentgoalsbased on disease-based guidelines and found to beimproving. But not at goal yet. Medications prescriptions no change Laboratory tests ordered to be performed today include lab. RECOMMENDATIONS: given include see cardiology.  Call physician is patient gains 3 lbs in one day or 5 lbs for one week.  Call for progressive PND, orthopnea or increased pedal edema.

## 2019-08-19 NOTE — Assessment & Plan Note (Signed)
AN INDIVIDUAL CARE PLAN was established and reinforced today.  The patient's status was assessed using clinical findings on exam, labs, and other diagnostic testing. Patient's success at meeting treatment goals based on disease specific evidence-bassed guidelines and found to be in good control. RECOMMENDATIONS include maintaining present medicines and treatment. 

## 2019-08-19 NOTE — Assessment & Plan Note (Signed)
AN INDIVIDUAL CARE PLAN was established and reinforced today.  The patient's status was assessed using clinical findings on exam, lab and other diagnostic tests. The patient's disease status was assessed based on evidence-based guidelines and found to be good controlled. MEDICATIONS were reviewed lp. SELF MANAGEMENT GOALS have been discussed and patient's success at attaining the goal of low cholesterol was assessed. RECOMMENDATION given include regular exercise 3 days a week and low cholesterol/low fat diet. CLINICAL SUMMARY including written plan to identify barriers unique to the patient due to social or economic  reasons was discussed.

## 2019-08-19 NOTE — Assessment & Plan Note (Signed)

## 2019-08-19 NOTE — Assessment & Plan Note (Signed)
AN INDIVIDUAL CARE PLAN was established and reinforced today.  The patient's status was assessed using clinical findings on exam, labs, and other diagnostic testing. Patient's success at meeting treatment goals based on disease specific evidence-bassed guidelines and found to be in fair control. RECOMMENDATIONS include maintaining present medicines and treatment. 

## 2019-08-20 ENCOUNTER — Ambulatory Visit (INDEPENDENT_AMBULATORY_CARE_PROVIDER_SITE_OTHER): Payer: PPO | Admitting: *Deleted

## 2019-08-20 DIAGNOSIS — I442 Atrioventricular block, complete: Secondary | ICD-10-CM

## 2019-08-20 LAB — CUP PACEART REMOTE DEVICE CHECK
Battery Impedance: 723 Ohm
Battery Remaining Longevity: 66 mo
Battery Voltage: 2.78 V
Brady Statistic AP VP Percent: 16 %
Brady Statistic AP VS Percent: 0 %
Brady Statistic AS VP Percent: 84 %
Brady Statistic AS VS Percent: 0 %
Date Time Interrogation Session: 20210301150348
Implantable Lead Implant Date: 20150319
Implantable Lead Implant Date: 20150319
Implantable Lead Location: 753859
Implantable Lead Location: 753860
Implantable Lead Model: 4092
Implantable Lead Model: 5076
Implantable Pulse Generator Implant Date: 20150319
Lead Channel Impedance Value: 398 Ohm
Lead Channel Impedance Value: 575 Ohm
Lead Channel Pacing Threshold Amplitude: 1 V
Lead Channel Pacing Threshold Amplitude: 1.5 V
Lead Channel Pacing Threshold Pulse Width: 0.4 ms
Lead Channel Pacing Threshold Pulse Width: 0.4 ms
Lead Channel Setting Pacing Amplitude: 2 V
Lead Channel Setting Pacing Amplitude: 3 V
Lead Channel Setting Pacing Pulse Width: 0.4 ms
Lead Channel Setting Sensing Sensitivity: 2 mV

## 2019-08-21 NOTE — Progress Notes (Signed)
PPM Remote  

## 2019-09-13 ENCOUNTER — Other Ambulatory Visit: Payer: Self-pay

## 2019-09-13 MED ORDER — HYDROCODONE-ACETAMINOPHEN 5-325 MG PO TABS: 1.0000 | ORAL_TABLET | Freq: Two times a day (BID) | ORAL | 0 refills | Status: DC | PRN

## 2019-09-20 ENCOUNTER — Telehealth: Payer: Self-pay | Admitting: Legal Medicine

## 2019-09-20 NOTE — Progress Notes (Signed)
  Chronic Care Management   Note  09/20/2019 Name: TOBY SELINSKY MRN: [id number] DOB: [birth year]  [name] is a 85 y.o. year old male who is a primary care patient of Lillard Anes, MD. I reached out to [name] by phone today in response to a referral sent by Mr. Basilio Cairo PCP, Lillard Anes, MD.   Mr. Croucher was given information about Chronic Care Management services today including:  1. CCM service includes personalized support from designated clinical staff supervised by his physician, including individualized plan of care and coordination with other care providers 2. 24/7 contact phone numbers for assistance for urgent and routine care needs. 3. Service will only be billed when office clinical staff spend 20 minutes or more in a month to coordinate care. 4. Only one practitioner may furnish and bill the service in a calendar month. 5. The patient may stop CCM services at any time (effective at the end of the month) by phone call to the office staff.   Patient agreed to services and verbal consent obtained.   Follow up plan:   Earney Hamburg Upstream Scheduler

## 2019-09-30 ENCOUNTER — Other Ambulatory Visit: Payer: Self-pay | Admitting: Cardiology

## 2019-10-03 NOTE — Chronic Care Management (AMB) (Signed)
Chronic Care Management Pharmacy  Name: [name]  MRN: [id number] DOB: Nov 02, 1934  Chief Complaint/ HPI  [name],  85 y.o. , male presents for their Initial CCM visit with the clinical pharmacist via telephone due to COVID-19 Pandemic.  PCP : Scott Anes, MD  Their chronic conditions include: angina, afib, HTN, gastritis, hypothyroidism, OA of both knees, CKD, Prostate cancer, HLD, Anemia.   Office Visits:  08/17/2019 - follow-up to adding amlodipine. No med changes.  06/18/2019 - Bursitis of elbow. No new med orders and refilled hydrocodone.  04/17/2019 - Follow-up TOC visit. Anemia noted started ferrous sulfate. Consult Visit: 08/08/2019 - Dr. Raliegh Ruiz visit. No med changes. Check BP at home and bring to Dr. Raliegh Ruiz.  05/08/2019 -.Dr. Raliegh Ruiz. No med changes. continue current anemia treatment.  04/19/2019 Scott Montana NP - no changes. Patient doesn't meet qualification of reduced dosing of Eliquis.  04/11/2019 - Dr. Eugenia Ruiz - Colon- 1 cecum polyp, 1 transverse polyp, EGD- Duodenal polyp biopsy, gastric biopsy for h.pylori, hiatal hernia  Medications: 04/09/2019 - hospitalized and given blood for anemia.  04/09/2019 - Decompensated heart failure transmitted to hospital via ambulance.   Outpatient Encounter Medications as of 10/04/2019  Medication Sig  . albuterol (PROVENTIL HFA;VENTOLIN HFA) 108 (90 Base) MCG/ACT inhaler Inhale 2 puffs into the lungs 4 (four) times daily as needed for wheezing or shortness of breath.  Marland Kitchen amLODipine (NORVASC) 5 MG tablet Take 1 tablet (5 mg total) by mouth at bedtime.  Marland Kitchen apixaban (ELIQUIS) 5 MG TABS tablet Take 1 tablet (5 mg total) by mouth 2 (two) times daily.  Marland Kitchen atorvastatin (LIPITOR) 40 MG tablet Take 40 mg by mouth at bedtime.   . FEROSUL 325 (65 Fe) MG tablet Take 325 mg by mouth daily.   . fluticasone (FLOVENT HFA) 110 MCG/ACT inhaler Inhale 2 puffs into the lungs daily.   Marland Kitchen HYDROcodone-acetaminophen (NORCO/VICODIN) 5-325 MG  tablet Take 1 tablet by mouth 2 (two) times daily as needed (pain).  . isosorbide mononitrate (IMDUR) 60 MG 24 hr tablet Take 1 tablet (60 mg total) by mouth at bedtime.  Marland Kitchen levothyroxine (SYNTHROID, LEVOTHROID) 50 MCG tablet Take 50 mcg by mouth at bedtime.   . nitroGLYCERIN (NITROSTAT) 0.4 MG SL tablet DISSOLVE 1 TABLET UNDER THE TONGUE EVERY 5 MINUTES AS NEEDED FOR CHEST PAIN. MAX 3 DOSES IN 15 MINUTES  . omeprazole (PRILOSEC) 20 MG capsule Take 20 mg by mouth daily.  . ranolazine (RANEXA) 500 MG 12 hr tablet TAKE 1 TABLET(500 MG) BY MOUTH TWICE DAILY  . vitamin B-12 (CYANOCOBALAMIN) 1000 MCG tablet Take 1,000 mcg by mouth daily.   . Calcium Carbonate-Vitamin D (CALCIUM 500 + D PO) Take 1 tablet by mouth daily.  . Multiple Vitamin (MULTIVITAMIN WITH MINERALS) TABS tablet Take 1 tablet by mouth daily.   No facility-administered encounter medications on file as of 10/04/2019.   No Known Allergies  SDOH Screenings   Alcohol Screen:   . Last Alcohol Screening Score (AUDIT):   Depression (PHQ2-9): Low Risk   . PHQ-2 Score: 0  Financial Resource Strain:   . Difficulty of Paying Living Expenses:   Food Insecurity:   . Worried About Charity fundraiser in the Last Year:   . [place] in the Last Year:   Housing:   . Last Housing Risk Score:   Physical Activity:   . Days of Exercise per Week:   . Minutes of Exercise per Session:   Social Connections:   .  Frequency of Communication with Friends and Family:   . Frequency of Social Gatherings with Friends and Family:   . Attends Religious Services:   . Active Member of Clubs or Organizations:   . Attends Archivist Meetings:   Marland Kitchen Marital Status:   Stress:   . Feeling of Stress :   Tobacco Use: High Risk  . Smoking Tobacco Use: Never Smoker  . Smokeless Tobacco Use: Current User  Transportation Needs: No Transportation Needs  . Lack of Transportation (Medical): No  . Lack of Transportation (Non-Medical): No      Current Diagnosis/Assessment:  Goals Addressed            This Visit's Progress   . Pharmacy Care Plan       CARE PLAN ENTRY  Current Barriers:  . Chronic Disease Management support, education, and care coordination needs related to HTN, HLD, and COPD  Pharmacist Clinical Goal(s):  . Ensure safety, efficacy, and affordability of medications . Recommend maintaining BP <140/90 mmHg . Goal Cholesterol: TC <200, LDL <100, TG <150 HDL >50 mg/dL   Interventions: . Comprehensive medication review performed. . Pharmacist is assisting in application of Eliquis patient assistance.  . Pharmacist recommended consuming iron rich foods in diet.   Patient Self Care Activities:  . Recommend engaging in 30 minutes of moderate intensity exercise 5 times each week.  . Recommend following the Dash diet guidance of incorporating fruits, vegetables, whole-grains, low-fat dairy products, lean meats, legumes and nuts for optimal health. . Recommend keeping sodium intake less than 1500 mg/day.   Initial goal documentation       Afib/ Pacemaker   Lab Results  Component Value Date   CREATININE 1.20 08/17/2019   CREATININE 1.21 05/08/2019   CREATININE 1.47 (H) 04/10/2019    Patient has failed these meds in past: aspirin 81 mg and 325 mg  Patient is currently controlled on the following medications: eliquis 5 mg bid  We discussed:  diet and exercise extensively  Plan  Continue current medications   Angina   Patient is currently rate controlled via pacemaker.  Patient has failed these meds in past: n/a Patient is currently controlled on the following medications: ranexa 500 mg bid, nitroglycerin 0.4 mg prn, isosorbide mn 60 mg daily  We discussed:  diet and exercise extensively. Wife indicates that patient doesn't really watch sodium intake but working on not overdoing. Today had grilled pork chop and sweet potato casserole. He likes to eat vegetables and spinach for dinner.  Rinsing can vegetables before cooking.   Plan  Continue current medications    Hyperlipidemia   Lipid Panel     Component Value Date/Time   CHOL 152 08/17/2019 0956   TRIG 73 08/17/2019 0956   HDL 71 08/17/2019 0956   CHOLHDL 2.1 08/17/2019 0956   CHOLHDL 2.2 04/10/2019 0448   VLDL 11 04/10/2019 0448   LDLCALC 67 08/17/2019 0956   LABVLDL 14 08/17/2019 0956     The ASCVD Risk score (Goff DC Jr., et al., 2013) failed to calculate for the following reasons:   The 2013 ASCVD risk score is only valid for ages 1 to 35   Patient has failed these meds in past: n/a Patient is currently controlled on the following medications: atorvastatin 40 mg daily  We discussed:  diet and exercise extensively. Patient limits portions and likes vegetables. Encouraged wife to continue rinsing can vegetables to reduce sodium. Wife reports that patient is beginning to ambulate better after falls and  hip repair. He can now walk on level surfaces like concrete driveway but avoiding unlevel yard.   Plan  Continue current medications   Hypertension   BP today is:  <140/90  Office blood pressures are  BP Readings from Last 3 Encounters:  08/17/19 (!) 146/80  08/08/19 (!) 156/60  05/08/19 (!) 162/60    Patient has failed these meds in the past: chlorthalidone 25 mg daily Patient is currently controlled on the following medications: amlodipine 5 mg daily,  Patient checks BP at home several times per month.   Patient home BP readings are ranging: normal per wife.   We discussed diet and exercise extensively. Patient is cautious about sodium intake and limits portions.   Plan  Continue current medications   Hypothyroidism   TSH  Date Value Ref Range Status  08/17/2019 2.580 0.450 - 4.500 uIU/mL Final     Patient has failed these meds in past: n/a  Patient is currently controlled on the following medications: levothyroxine 50 mcg daily  We discussed:  Discussed importance of taking  medciation daily.   Plan  Continue current medications     and  Osteoarthritis/Hx of Hip Fracture   Patient has failed these meds in past: n/a Patient is currently controlled on the following medications: calcium+d daily, hydrocodone-apap 5-325 bid prn.   We discussed:  diet and exercise extensively. Balance is a big issue. Takes him time walking in the house. Can walk in the driveway that is concrete. Discussed fall risk with hydrocodone. He takes the least amount possible.   Plan  Continue current medications   Anemia   CBC    Component Value Date/Time   WBC 5.2 08/17/2019 0956   WBC 5.1 04/12/2019 0458   RBC 3.90 (L) 08/17/2019 0956   RBC 3.76 (L) 04/12/2019 0458   HGB 12.4 (L) 08/17/2019 0956   HCT 36.8 (L) 08/17/2019 0956   PLT 202 08/17/2019 0956   MCV 94 08/17/2019 0956   MCH 31.8 08/17/2019 0956   MCH 24.7 (L) 04/12/2019 0458   MCHC 33.7 08/17/2019 0956   MCHC 32.2 04/12/2019 0458   RDW 12.1 08/17/2019 0956   LYMPHSABS 1.2 08/17/2019 0956   MONOABS 0.5 08/15/2017 0013   EOSABS 0.3 08/17/2019 0956   BASOSABS 0.1 08/17/2019 0956     Patient has failed these meds in past: folic acid [id number] mcg daily Patient is currently controlled on the following medications: ferosul 325 mg tablet daily , vitamin b-12 1000 mcg daily  We discussed:  diet and exercise extensively. Patient was unable to tolerate twice daily dosing of iron. Discussed foods that are rich in iron. Patient likes dark leafy greens.   Plan  Continue current medications  Gastritis   Patient has failed these meds in past: n/a Patient is currently controlled on the following medications: omeprazole  We discussed:  diet and exercise extensively. Patient notices increased symptoms if eats something spicy. Wife indicates that he works to avoid trigger foods.   Plan  Continue current medications   COPD   Patient has failed these meds in past: n/a Patient is currently controlled on the following  medications: albuterol hfa prn, Flovent 110 mcg/act - 2 puffs daily  We discussed:  Patient is compliant with maintenance inhaler and is not having to use albuterol often. His symptoms are well managed with Flovent.  Plan  Continue current medications   Vaccines   Reviewed and discussed patient's vaccination history.    Immunization History  Administered Date(s) Administered  .  Janssen (J&J) SARS-COV-2 Vaccination 09/27/2019   Plan  Recommended patient receive annual flu vaccine in office.   Medication Management   Pt uses [facility] for all medications Weekly pill box. Pt endorses good compliance  We discussed: Eliquis and Flovent become very expensive during the doughnut hole phase. Patient's wife indicates that she would like to pursue patient assistance for Eliquis to avoid the doughnut hole for as long as they can.   Plan  Apply for Patient Assistance for Eliquis now. Apply for Patient Assistance for Flovent when patient has reached $600 out of pocket for the year.   Follow up: 3 months

## 2019-10-04 ENCOUNTER — Other Ambulatory Visit: Payer: Self-pay

## 2019-10-04 ENCOUNTER — Ambulatory Visit: Payer: PPO

## 2019-10-04 DIAGNOSIS — E785 Hyperlipidemia, unspecified: Secondary | ICD-10-CM

## 2019-10-04 DIAGNOSIS — E039 Hypothyroidism, unspecified: Secondary | ICD-10-CM

## 2019-10-04 DIAGNOSIS — I10 Essential (primary) hypertension: Secondary | ICD-10-CM

## 2019-10-04 NOTE — Patient Instructions (Addendum)
Visit Information  Thank you for your time discussing your medications. I look forward to working with you to achieve your health care goals. Below is a summary of what we talked about during our visit.   Goals Addressed            This Visit's Progress   . Pharmacy Care Plan       CARE PLAN ENTRY  Current Barriers:  . Chronic Disease Management support, education, and care coordination needs related to HTN, HLD, and COPD  Pharmacist Clinical Goal(s):  . Ensure safety, efficacy, and affordability of medications . Recommend maintaining BP <140/90 mmHg . Goal Cholesterol: TC <200, LDL <100, TG <150 HDL >50 mg/dL   Interventions: . Comprehensive medication review performed. . Pharmacist is assisting in application of Eliquis patient assistance.  . Pharmacist recommended consuming iron rich foods in diet.   Patient Self Care Activities:  . Recommend engaging in 30 minutes of moderate intensity exercise 5 times each week.  . Recommend following the Dash diet guidance of incorporating fruits, vegetables, whole-grains, low-fat dairy products, lean meats, legumes and nuts for optimal health. . Recommend keeping sodium intake less than 1500 mg/day.   Initial goal documentation        Mr. Neller was given information about Chronic Care Management services today including:  1. CCM service includes personalized support from designated clinical staff supervised by his physician, including individualized plan of care and coordination with other care providers 2. 24/7 contact phone numbers for assistance for urgent and routine care needs. 3. Standard insurance, coinsurance, copays and deductibles apply for chronic care management only during months in which we provide at least 20 minutes of these services. Most insurances cover these services at 100%, however patients may be responsible for any copay, coinsurance and/or deductible if applicable. This service may help you avoid the need for  more expensive face-to-face services. 4. Only one practitioner may furnish and bill the service in a calendar month. 5. The patient may stop CCM services at any time (effective at the end of the month) by phone call to the office staff.  Patient agreed to services and verbal consent obtained.   Print copy of patient instructions provided.  Telephone follow up appointment with pharmacy team member scheduled for: July 2021  Sherre Poot, PharmD Clinical Pharmacist [facility] [phone]  Iron-Rich Diet  Iron is a mineral that helps your body to produce hemoglobin. Hemoglobin is a protein in red blood cells that carries oxygen to your body's tissues. Eating too little iron may cause you to feel weak and tired, and it can increase your risk of infection. Iron is naturally found in many foods, and many foods have iron added to them (iron-fortified foods). You may need to follow an iron-rich diet if you do not have enough iron in your body due to certain medical conditions. The amount of iron that you need each day depends on your age, your sex, and any medical conditions you have. Follow instructions from your health care provider or a diet and nutrition specialist (dietitian) about how much iron you should eat each day. What are tips for following this plan? Reading food labels  Check food labels to see how many milligrams (mg) of iron are in each serving. Cooking  Cook foods in pots and pans that are made from iron.  Take these steps to make it easier for your body to absorb iron from certain foods: ? Soak beans overnight before cooking. ?  Soak whole grains overnight and drain them before using. ? Ferment flours before baking, such as by using yeast in bread dough. Meal planning  When you eat foods that contain iron, you should eat them with foods that are high in vitamin C. These include oranges, peppers, tomatoes, potatoes, and mango. Vitamin C helps your body to absorb  iron. General information  Take iron supplements only as told by your health care provider. An overdose of iron can be life-threatening. If you were prescribed iron supplements, take them with orange juice or a vitamin C supplement.  When you eat iron-fortified foods or take an iron supplement, you should also eat foods that naturally contain iron, such as meat, poultry, and fish. Eating naturally iron-rich foods helps your body to absorb the iron that is added to other foods or contained in a supplement.  Certain foods and drinks prevent your body from absorbing iron properly. Avoid eating these foods in the same meal as iron-rich foods or with iron supplements. These foods include: ? Coffee, black tea, and red wine. ? Milk, dairy products, and foods that are high in calcium. ? Beans and soybeans. ? Whole grains. What foods should I eat? Fruits Prunes. Raisins. Eat fruits high in vitamin C, such as oranges, grapefruits, and strawberries, alongside iron-rich foods. Vegetables Spinach (cooked). Green peas. Broccoli. Fermented vegetables. Eat vegetables high in vitamin C, such as leafy greens, potatoes, bell peppers, and tomatoes, alongside iron-rich foods. Grains Iron-fortified breakfast cereal. Iron-fortified whole-wheat bread. Enriched rice. Sprouted grains. Meats and other proteins Beef liver. Oysters. Beef. Shrimp. [place]. Chicken. [place]. Sardines. Chickpeas. Nuts. Tofu. Pumpkin seeds. Beverages Tomato juice. Fresh orange juice. Prune juice. Hibiscus tea. Fortified instant breakfast shakes. Sweets and desserts Blackstrap molasses. Seasonings and condiments Tahini. Fermented soy sauce. Other foods Wheat germ. The items listed above may not be a complete list of recommended foods and beverages. Contact a dietitian for more information. What foods should I avoid? Grains Whole grains. Bran cereal. Bran flour. Oats. Meats and other proteins Soybeans. Products made from soy protein.  Black beans. Lentils. Mung beans. Split peas. Dairy Milk. Cream. Cheese. Yogurt. Cottage cheese. Beverages Coffee. Black tea. Red wine. Sweets and desserts Cocoa. Chocolate. Ice cream. Other foods Basil. Oregano. Large amounts of parsley. The items listed above may not be a complete list of foods and beverages to avoid. Contact a dietitian for more information. Summary  Iron is a mineral that helps your body to produce hemoglobin. Hemoglobin is a protein in red blood cells that carries oxygen to your body's tissues.  Iron is naturally found in many foods, and many foods have iron added to them (iron-fortified foods).  When you eat foods that contain iron, you should eat them with foods that are high in vitamin C. Vitamin C helps your body to absorb iron.  Certain foods and drinks prevent your body from absorbing iron properly, such as whole grains and dairy products. You should avoid eating these foods in the same meal as iron-rich foods or with iron supplements. This information is not intended to replace advice given to you by your health care provider. Make sure you discuss any questions you have with your health care provider. Document Revised: 05/20/2017 Document Reviewed: 05/03/2017 Elsevier Patient Education  2020 [employer].

## 2019-10-04 NOTE — Progress Notes (Signed)
Patient had an appointment today with Sara Beth Brown, PharmD. 

## 2019-10-17 ENCOUNTER — Other Ambulatory Visit: Payer: Self-pay

## 2019-10-17 MED ORDER — HYDROCODONE-ACETAMINOPHEN 5-325 MG PO TABS: 1.0000 | ORAL_TABLET | Freq: Two times a day (BID) | ORAL | 0 refills | Status: DC | PRN

## 2019-11-16 ENCOUNTER — Other Ambulatory Visit: Payer: Self-pay

## 2019-11-16 MED ORDER — HYDROCODONE-ACETAMINOPHEN 5-325 MG PO TABS: 1.0000 | ORAL_TABLET | Freq: Two times a day (BID) | ORAL | 0 refills | Status: DC | PRN

## 2019-11-19 LAB — CUP PACEART REMOTE DEVICE CHECK
Battery Impedance: 774 Ohm
Battery Remaining Longevity: 63 mo
Battery Voltage: 2.77 V
Brady Statistic AP VP Percent: 17 %
Brady Statistic AP VS Percent: 0 %
Brady Statistic AS VP Percent: 83 %
Brady Statistic AS VS Percent: 0 %
Date Time Interrogation Session: 20210528181052
Implantable Lead Implant Date: 20150319
Implantable Lead Implant Date: 20150319
Implantable Lead Location: 753859
Implantable Lead Location: 753860
Implantable Lead Model: 4092
Implantable Lead Model: 5076
Implantable Pulse Generator Implant Date: 20150319
Lead Channel Impedance Value: 397 Ohm
Lead Channel Impedance Value: 562 Ohm
Lead Channel Pacing Threshold Amplitude: 0.875 V
Lead Channel Pacing Threshold Amplitude: 1.5 V
Lead Channel Pacing Threshold Pulse Width: 0.4 ms
Lead Channel Pacing Threshold Pulse Width: 0.4 ms
Lead Channel Setting Pacing Amplitude: 1.875
Lead Channel Setting Pacing Amplitude: 3 V
Lead Channel Setting Pacing Pulse Width: 0.4 ms
Lead Channel Setting Sensing Sensitivity: 2 mV

## 2019-11-21 ENCOUNTER — Ambulatory Visit (INDEPENDENT_AMBULATORY_CARE_PROVIDER_SITE_OTHER): Payer: PPO | Admitting: *Deleted

## 2019-11-21 DIAGNOSIS — I442 Atrioventricular block, complete: Secondary | ICD-10-CM

## 2019-11-27 NOTE — Progress Notes (Signed)
Remote pacemaker transmission.   

## 2019-12-04 ENCOUNTER — Telehealth: Payer: Self-pay

## 2019-12-04 NOTE — Telephone Encounter (Cosign Needed)
Eliquis approved through [place] Patient Assistance 11/28/2019 - 06/20/2020.

## 2019-12-09 ENCOUNTER — Other Ambulatory Visit: Payer: Self-pay | Admitting: Legal Medicine

## 2019-12-14 ENCOUNTER — Other Ambulatory Visit: Payer: Self-pay

## 2019-12-14 ENCOUNTER — Encounter: Payer: Self-pay | Admitting: Legal Medicine

## 2019-12-14 ENCOUNTER — Ambulatory Visit (INDEPENDENT_AMBULATORY_CARE_PROVIDER_SITE_OTHER): Payer: PPO | Admitting: Legal Medicine

## 2019-12-14 VITALS — BP 130/78 | HR 88 | Temp 97.5°F | Resp 17 | Ht 63.39 in | Wt 185.0 lb

## 2019-12-14 DIAGNOSIS — K2100 Gastro-esophageal reflux disease with esophagitis, without bleeding: Secondary | ICD-10-CM | POA: Diagnosis not present

## 2019-12-14 DIAGNOSIS — N1831 Chronic kidney disease, stage 3a: Secondary | ICD-10-CM

## 2019-12-14 DIAGNOSIS — E782 Mixed hyperlipidemia: Secondary | ICD-10-CM

## 2019-12-14 DIAGNOSIS — C61 Malignant neoplasm of prostate: Secondary | ICD-10-CM | POA: Diagnosis not present

## 2019-12-14 DIAGNOSIS — E785 Hyperlipidemia, unspecified: Secondary | ICD-10-CM

## 2019-12-14 DIAGNOSIS — I10 Essential (primary) hypertension: Secondary | ICD-10-CM

## 2019-12-14 DIAGNOSIS — I2510 Atherosclerotic heart disease of native coronary artery without angina pectoris: Secondary | ICD-10-CM

## 2019-12-14 DIAGNOSIS — E039 Hypothyroidism, unspecified: Secondary | ICD-10-CM

## 2019-12-14 HISTORY — DX: Mixed hyperlipidemia: E78.2

## 2019-12-14 NOTE — Progress Notes (Signed)
Subjective:  Patient ID: [name], male    DOB: 10/28/1934  Age: 85 y.o. MRN: [id number]  Chief Complaint  Patient presents with  . Hypertension  . Hypothyroidism    HPI: chronic visit.  CORONARY ARTERY DISEASE  Patient presents in follow up of CAD. Patient was diagnosed in 2018. The patient has no associated CHF. The patient is currently taking a beta blocker, statin, and aspirin. CAD was diagnosed 3 years ago.  Patient is having some angina. Patient has used some NTG.  Patient is followed by cardiology.  Patient had cath . Last angiography was 2018, last echocardiogram 2018  Patient presents for follow up of hypertension.  Patient tolerating amlodipine well with side effects.  Patient was diagnosed with hypertension 2010 so has been treated for hypertension for 10 years.Patient is working on maintaining diet and exercise regimen and follows up as directed. Complication include cad, pacemaker  Patient has HYPOTHYROIDISM.  Diagnosed 2010 years ago.  Patient has stable thyroid readings.  Patient is having no symptoms.  Last TSH was 2.3.  continue dosage of thyroid medicine...   Current Outpatient Medications on File Prior to Visit  Medication Sig Dispense Refill  . albuterol (PROVENTIL HFA;VENTOLIN HFA) 108 (90 Base) MCG/ACT inhaler Inhale 2 puffs into the lungs 4 (four) times daily as needed for wheezing or shortness of breath.    Marland Kitchen amLODipine (NORVASC) 5 MG tablet Take 1 tablet (5 mg total) by mouth at bedtime. 90 tablet 1  . apixaban (ELIQUIS) 5 MG TABS tablet Take 1 tablet (5 mg total) by mouth 2 (two) times daily. 60 tablet 6  . atorvastatin (LIPITOR) 40 MG tablet TAKE 1 TABLET BY MOUTH DAILY 90 tablet 2  . Calcium Carbonate-Vitamin D (CALCIUM 500 + D PO) Take 1 tablet by mouth daily.    . FEROSUL 325 (65 Fe) MG tablet Take 325 mg by mouth daily.     Marland Kitchen HYDROcodone-acetaminophen (NORCO/VICODIN) 5-325 MG tablet Take 1 tablet by mouth 2 (two) times daily as needed (pain). 60  tablet 0  . isosorbide mononitrate (IMDUR) 60 MG 24 hr tablet Take 1 tablet (60 mg total) by mouth at bedtime. 90 tablet 1  . levothyroxine (SYNTHROID, LEVOTHROID) 50 MCG tablet Take 50 mcg by mouth at bedtime.     . Multiple Vitamin (MULTIVITAMIN WITH MINERALS) TABS tablet Take 1 tablet by mouth daily.    . nitroGLYCERIN (NITROSTAT) 0.4 MG SL tablet DISSOLVE 1 TABLET UNDER THE TONGUE EVERY 5 MINUTES AS NEEDED FOR CHEST PAIN. MAX 3 DOSES IN 15 MINUTES 25 tablet 3  . omeprazole (PRILOSEC) 20 MG capsule Take 20 mg by mouth daily.    . ranolazine (RANEXA) 500 MG 12 hr tablet TAKE 1 TABLET(500 MG) BY MOUTH TWICE DAILY 180 tablet 2  . vitamin B-12 (CYANOCOBALAMIN) 1000 MCG tablet Take 1,000 mcg by mouth daily.     . fluticasone (FLOVENT HFA) 110 MCG/ACT inhaler Inhale 2 puffs into the lungs daily.      No current facility-administered medications on file prior to visit.   Past Medical History:  Diagnosis Date  . Arthritis   . Asthma   . Carotid artery disease ([place])    a. h/o occluded R ICA.  Marland Kitchen CHB (complete heart block) ([facility])    a. s/p ppm.  . Chronic anemia   . CKD (chronic kidney disease), stage III   . Coronary artery disease    a. moderate nonobstructive CAD by cath in 2018.  Marland Kitchen GERD (gastroesophageal reflux  disease)   . Hypertension   . Hypothyroidism   . PAF (paroxysmal atrial fibrillation) ([place])   . Peripheral vascular disease [facility])    Past Surgical History:  Procedure Laterality Date  . BIOPSY  04/11/2019   Procedure: BIOPSY;  Surgeon: Milus Banister, MD;  Location: [place];  Service: Endoscopy;;  . CATARACT EXTRACTION    . COLONOSCOPY WITH PROPOFOL N/A 04/11/2019   Procedure: COLONOSCOPY WITH PROPOFOL;  Surgeon: Milus Banister, MD;  Location: [facility] ENDOSCOPY;  Service: Endoscopy;  Laterality: N/A;  . ESOPHAGOGASTRODUODENOSCOPY (EGD) WITH PROPOFOL N/A 04/11/2019   Procedure: ESOPHAGOGASTRODUODENOSCOPY (EGD) WITH PROPOFOL;  Surgeon: Milus Banister, MD;  Location: [facility]  ENDOSCOPY;  Service: Endoscopy;  Laterality: N/A;  . HIP SURGERY    . INSERT / REPLACE / REMOVE PACEMAKER     [employer]  . JOINT REPLACEMENT Right 10/20/1994   Hip  (Left Hip in 10/96)  . LEFT HEART CATH AND CORONARY ANGIOGRAPHY N/A 02/08/2017   Procedure: LEFT HEART CATH AND CORONARY ANGIOGRAPHY;  Surgeon: Martinique, Peter M, MD;  Location: [place] CV LAB;  Service: Cardiovascular;  Laterality: N/A;  . ORIF PERIPROSTHETIC FRACTURE Right 08/17/2017   Procedure: OPEN REDUCTION INTERNAL FIXATION (ORIF) PERIPROSTHETIC FRACTURE RIGHT FEMUR;  Surgeon: Paralee Cancel, MD;  Location: [facility];  Service: Orthopedics;  Laterality: Right;  . ORIF PERIPROSTHETIC FRACTURE Right 01/23/2018   Procedure: Redo open reduction internal fixation right periprosthetic femur fracture;  Surgeon: Paralee Cancel, MD;  Location: [facility];  Service: Orthopedics;  Laterality: Right;  120 mins  . POLYPECTOMY  04/11/2019   Procedure: POLYPECTOMY;  Surgeon: Milus Banister, MD;  Location: [facility] ENDOSCOPY;  Service: Endoscopy;;    Family History  Problem Relation Age of Onset  . Heart attack Mother    Social History   Socioeconomic History  . Marital status: Married    Spouse name: Not on file  . Number of children: 2  . Years of education: Not on file  . Highest education level: 6th grade  Occupational History  . Occupation: Retired  Tobacco Use  . Smoking status: Never Smoker  . Smokeless tobacco: Current User    Types: Chew  . Tobacco comment: 1 can per day  Vaping Use  . Vaping Use: Never used  Substance and Sexual Activity  . Alcohol use: Yes    Alcohol/week: 3.0 standard drinks    Types: 3 Cans of beer per week    Comment: regular use 4 times a week  . Drug use: No  . Sexual activity: Not Currently    Partners: Female    Birth control/protection: None  Other Topics Concern  . Not on file  Social History Narrative  . Not on file   Social Determinants of Health   Financial Resource Strain:   .  Difficulty of Paying Living Expenses:   Food Insecurity:   . Worried About Charity fundraiser in the Last Year:   . Arboriculturist in the Last Year:   Transportation Needs: No Transportation Needs  . Lack of Transportation (Medical): No  . Lack of Transportation (Non-Medical): No  Physical Activity:   . Days of Exercise per Week:   . Minutes of Exercise per Session:   Stress:   . Feeling of Stress :   Social Connections:   . Frequency of Communication with Friends and Family:   . Frequency of Social Gatherings with Friends and Family:   . Attends Religious Services:   . Active Member  of Clubs or Organizations:   . Attends Archivist Meetings:   Marland Kitchen Marital Status:     Review of Systems  Constitutional: Negative.   HENT: Negative.   Eyes: Negative.   Respiratory: Negative.   Cardiovascular: Positive for chest pain.  Endocrine: Negative.   Genitourinary: Negative.   Musculoskeletal: Negative.   Skin: Negative.   Neurological: Negative.   Psychiatric/Behavioral: Negative.      Objective:  BP 130/78 (BP Location: Left Arm, Patient Position: Sitting)   Pulse 88   Temp (!) 97.5 F (36.4 C) (Temporal)   Resp 17   Ht 5' 3.39" (1.61 m)   Wt 185 lb (83.9 kg)   SpO2 98%   BMI 32.37 kg/m   BP/Weight 12/14/2019 08/17/2019 6/81/1572  Systolic BP 620 355 974  Diastolic BP 78 80 60  Wt. (Lbs) 185 186 187  BMI 32.37 33.12 31.12    Physical Exam Vitals reviewed.  Constitutional:      Appearance: Normal appearance.  HENT:     Head: Normocephalic and atraumatic.     Right Ear: Tympanic membrane, ear canal and external ear normal.     Left Ear: Tympanic membrane, ear canal and external ear normal.     Nose: Nose normal.     Mouth/Throat:     Mouth: Mucous membranes are moist.  Eyes:     Extraocular Movements: Extraocular movements intact.     Conjunctiva/sclera: Conjunctivae normal.     Pupils: Pupils are equal, round, and reactive to light.  Cardiovascular:       Rate and Rhythm: Normal rate and regular rhythm.     Pulses: Normal pulses.     Heart sounds: Normal heart sounds.  Pulmonary:     Effort: Pulmonary effort is normal.     Breath sounds: Normal breath sounds.  Abdominal:     General: Abdomen is flat. Bowel sounds are normal.     Palpations: Abdomen is soft.  Musculoskeletal:        General: Normal range of motion.     Cervical back: Normal range of motion and neck supple.  Skin:    General: Skin is warm and dry.     Capillary Refill: Capillary refill takes less than 2 seconds.  Neurological:     General: No focal deficit present.     Mental Status: He is alert and oriented to person, place, and time. Mental status is at baseline.  Psychiatric:        Mood and Affect: Mood normal.        Thought Content: Thought content normal.        Judgment: Judgment normal.       Lab Results  Component Value Date   WBC 5.3 12/14/2019   HGB 12.6 (L) 12/14/2019   HCT 38.0 12/14/2019   PLT 212 12/14/2019   GLUCOSE 95 12/14/2019   CHOL 155 12/14/2019   TRIG 117 12/14/2019   HDL 52 12/14/2019   LDLCALC 82 12/14/2019   ALT 13 12/14/2019   AST 19 12/14/2019   NA 139 12/14/2019   K 4.7 12/14/2019   CL 103 12/14/2019   CREATININE 1.34 (H) 12/14/2019   BUN 15 12/14/2019   CO2 24 12/14/2019   TSH 2.580 08/17/2019   INR 1.41 08/15/2017   HGBA1C  07/07/2008    5.8 (NOTE)   The ADA recommends the following therapeutic goal for glycemic   control related to Hgb A1C measurement:   Goal of Therapy:   <  7.0% Hgb A1C   Reference: American Diabetes Association: Clinical Practice   Recommendations 2008, Diabetes Care,  2008, 31:(Suppl 1).      Assessment & Plan:   1. Essential hypertension - Comprehensive metabolic panel - CBC with Differential/Platelet An individual hypertension care plan was established and reinforced today.  The patient's status was assessed using clinical findings on exam and labs or diagnostic tests. The patient's  success at meeting treatment goals on disease specific evidence-based guidelines and found to be well controlled. SELF MANAGEMENT: The patient and I together assessed ways to personally work towards obtaining the recommended goals. RECOMMENDATIONS: avoid decongestants found in common cold remedies, decrease consumption of alcohol, perform routine monitoring of BP with home BP cuff, exercise, reduction of dietary salt, take medicines as prescribed, try not to miss doses and quit smoking.  Regular exercise and maintaining a healthy weight is needed.  Stress reduction may help. A CLINICAL SUMMARY including written plan identify barriers to care unique to individual due to social or financial issues.  We attempt to mutually creat solutions for individual and family understanding.  2. Mixed hyperlipidemia - Lipid Panel AN INDIVIDUAL CARE PLAN for hyperlipidemia/ cholesterol was established and reinforced today.  The patient's status was assessed using clinical findings on exam, lab and other diagnostic tests. The patient's disease status was assessed based on evidence-based guidelines and found to be well controlled. MEDICATIONS were reviewed. SELF MANAGEMENT GOALS have been discussed and patient's success at attaining the goal of low cholesterol was assessed. RECOMMENDATION given include regular exercise 3 days a week and low cholesterol/low fat diet. CLINICAL SUMMARY including written plan to identify barriers unique to the patient due to social or economic  reasons was discussed.  3. Hypothyroidism, unspecified type - TSH; Future Patient is known to have hypothyroidism and is  treatment with levothyroxine.  Patient was diagnosed 10 years ago.  Other treatment includes none.  Patient is compliant with medicines and last TSH 6 months ago.  Last TSH was normal.  4. Malignant neoplasm of prostate [facility]) Patient is undergoing care for his prostate cancer  5. Atherosclerosis of native coronary artery of  native heart without angina pectoris An individual plan was formulated based on patient history and exam, labs and evidence based data. Patient has not had recent angina or nitroglycerin use. continue present treatment.  6. Stage 3a chronic kidney disease AN INDIVIDUAL CARE PLAN for renal disease was established and reinforced today.  The patient's status was assessed using clinical findings on exam, labs, and other diagnostic testing. Patient's success at meeting treatment goals based on disease specific evidence-bassed guidelines and found to be in good control. RECOMMENDATIONS include maintaining present medicines and treatment.  7. Gastroesophageal reflux disease with esophagitis without hemorrhage Plan of care was formulated today.  he is doing well.  A plan of care was formulated using patient exam, tests and other sources to optimize care using evidence based information.  Recommend no smoking, no eating after supper, avoid fatty foods, elevate Head of bed, avoid tight fitting clothing.  Continue on omeprazole.      No orders of the defined types were placed in this encounter.   Orders Placed This Encounter  Procedures  . Comprehensive metabolic panel  . CBC with Differential/Platelet  . TSH  . Lipid Panel  . Cardiovascular Risk Assessment     Follow-up: Return in about 4 months (around 04/14/2020) for fasting.  An After Visit Summary was printed and given to the patient.  Purcell Nails  [place] [phone]

## 2019-12-15 LAB — COMPREHENSIVE METABOLIC PANEL
ALT: 13 IU/L (ref 0–44)
AST: 19 IU/L (ref 0–40)
Albumin/Globulin Ratio: 1.6 (ref 1.2–2.2)
Albumin: 4.2 g/dL (ref 3.6–4.6)
Alkaline Phosphatase: 82 IU/L (ref 48–121)
BUN/Creatinine Ratio: 11 (ref 10–24)
BUN: 15 mg/dL (ref 8–27)
Bilirubin Total: 0.5 mg/dL (ref 0.0–1.2)
CO2: 24 mmol/L (ref 20–29)
Calcium: 9.3 mg/dL (ref 8.6–10.2)
Chloride: 103 mmol/L (ref 96–106)
Creatinine, Ser: 1.34 mg/dL — ABNORMAL HIGH (ref 0.76–1.27)
GFR calc Af Amer: 57 mL/min/{1.73_m2} — ABNORMAL LOW (ref >59)
GFR calc non Af Amer: 49 mL/min/{1.73_m2} — ABNORMAL LOW (ref >59)
Globulin, Total: 2.6 g/dL (ref 1.5–4.5)
Glucose: 95 mg/dL (ref 65–99)
Potassium: 4.7 mmol/L (ref 3.5–5.2)
Sodium: 139 mmol/L (ref 134–144)
Total Protein: 6.8 g/dL (ref 6.0–8.5)

## 2019-12-15 LAB — CBC WITH DIFFERENTIAL/PLATELET
Basophils Absolute: 0 10*3/uL (ref 0.0–0.2)
Basos: 1 %
EOS (ABSOLUTE): 0.4 10*3/uL (ref 0.0–0.4)
Eos: 7 %
Hematocrit: 38 % (ref 37.5–51.0)
Hemoglobin: 12.6 g/dL — ABNORMAL LOW (ref 13.0–17.7)
Immature Grans (Abs): 0 10*3/uL (ref 0.0–0.1)
Immature Granulocytes: 0 %
Lymphocytes Absolute: 1.1 10*3/uL (ref 0.7–3.1)
Lymphs: 20 %
MCH: 30.7 pg (ref 26.6–33.0)
MCHC: 33.2 g/dL (ref 31.5–35.7)
MCV: 93 fL (ref 79–97)
Monocytes Absolute: 0.7 10*3/uL (ref 0.1–0.9)
Monocytes: 14 %
Neutrophils Absolute: 3.1 10*3/uL (ref 1.4–7.0)
Neutrophils: 58 %
Platelets: 212 10*3/uL (ref 150–450)
RBC: 4.1 x10E6/uL — ABNORMAL LOW (ref 4.14–5.80)
RDW: 12.3 % (ref 11.6–15.4)
WBC: 5.3 10*3/uL (ref 3.4–10.8)

## 2019-12-15 LAB — LIPID PANEL
Chol/HDL Ratio: 3 ratio (ref 0.0–5.0)
Cholesterol, Total: 155 mg/dL (ref 100–199)
HDL: 52 mg/dL (ref >39)
LDL Chol Calc (NIH): 82 mg/dL (ref 0–99)
Triglycerides: 117 mg/dL (ref 0–149)
VLDL Cholesterol Cal: 21 mg/dL (ref 5–40)

## 2019-12-15 LAB — CARDIOVASCULAR RISK ASSESSMENT

## 2019-12-15 NOTE — Progress Notes (Signed)
Kidney tests stable, liver tests normal, mild anemia, Cholesterol normal,  lp

## 2019-12-19 ENCOUNTER — Other Ambulatory Visit: Payer: Self-pay

## 2019-12-19 MED ORDER — HYDROCODONE-ACETAMINOPHEN 5-325 MG PO TABS: 1.0000 | ORAL_TABLET | Freq: Two times a day (BID) | ORAL | 0 refills | Status: DC | PRN

## 2019-12-25 ENCOUNTER — Other Ambulatory Visit: Payer: Self-pay | Admitting: Family

## 2019-12-25 DIAGNOSIS — I2510 Atherosclerotic heart disease of native coronary artery without angina pectoris: Secondary | ICD-10-CM

## 2019-12-26 ENCOUNTER — Other Ambulatory Visit: Payer: Self-pay | Admitting: Family

## 2019-12-26 DIAGNOSIS — I2510 Atherosclerotic heart disease of native coronary artery without angina pectoris: Secondary | ICD-10-CM

## 2020-01-01 ENCOUNTER — Other Ambulatory Visit: Payer: Self-pay | Admitting: Legal Medicine

## 2020-01-02 NOTE — Chronic Care Management (AMB) (Signed)
Chronic Care Management Pharmacy  Name: [name]  MRN: [id number] DOB: 12-22-1934  Chief Complaint/ HPI  [name],  85 y.o. , male presents for their Follow-Up CCM visit with the clinical pharmacist via telephone due to COVID-19 Pandemic.  PCP : Lillard Anes, MD  Their chronic conditions include: angina, afib, HTN, gastritis, hypothyroidism, OA of both knees, CKD, Prostate cancer, HLD, Anemia.   Office Visits:  12/14/2019 -  No medication changes. RTC in 4 months. Kidney tests stable, liver tests normal, mild anemia, Cholesterol normal,  08/17/2019 - follow-up to adding amlodipine. No med changes.  06/18/2019 - Bursitis of elbow. No new med orders and refilled hydrocodone.  04/17/2019 - Follow-up TOC visit. Anemia noted started ferrous sulfate. Consult Visit: 08/08/2019 - Dr. Raliegh Ip visit. No med changes. Check BP at home and bring to Dr. Raliegh Ip.  05/08/2019 -.Dr. Raliegh Ip. No med changes. continue current anemia treatment.  04/19/2019 Laurann Montana NP - no changes. Patient doesn't meet qualification of reduced dosing of Eliquis.  04/11/2019 - Dr. Eugenia Pancoast - Colon- 1 cecum polyp, 1 transverse polyp, EGD- Duodenal polyp biopsy, gastric biopsy for h.pylori, hiatal hernia  Medications: 04/09/2019 - hospitalized and given blood for anemia.  04/09/2019 - Decompensated heart failure transmitted to hospital via ambulance.   Outpatient Encounter Medications as of 01/03/2020  Medication Sig  . albuterol (PROVENTIL HFA;VENTOLIN HFA) 108 (90 Base) MCG/ACT inhaler Inhale 2 puffs into the lungs 4 (four) times daily as needed for wheezing or shortness of breath.  Marland Kitchen amLODipine (NORVASC) 5 MG tablet Take 1 tablet (5 mg total) by mouth at bedtime.  Marland Kitchen apixaban (ELIQUIS) 5 MG TABS tablet Take 1 tablet (5 mg total) by mouth 2 (two) times daily.  Marland Kitchen atorvastatin (LIPITOR) 40 MG tablet TAKE 1 TABLET BY MOUTH DAILY  . Calcium Carbonate-Vitamin D (CALCIUM 500 + D PO) Take 1 tablet by mouth daily.   . FEROSUL 325 (65 Fe) MG tablet Take 325 mg by mouth daily.   Marland Kitchen FLOVENT HFA 110 MCG/ACT inhaler INHALE 2 PUFFS BY MOUTH TWICE DAILY  . HYDROcodone-acetaminophen (NORCO/VICODIN) 5-325 MG tablet Take 1 tablet by mouth 2 (two) times daily as needed (pain).  . isosorbide mononitrate (IMDUR) 60 MG 24 hr tablet TAKE 1 TABLET(60 MG) BY MOUTH DAILY  . levothyroxine (SYNTHROID, LEVOTHROID) 50 MCG tablet Take 50 mcg by mouth at bedtime.   . Multiple Vitamin (MULTIVITAMIN WITH MINERALS) TABS tablet Take 1 tablet by mouth daily.  . nitroGLYCERIN (NITROSTAT) 0.4 MG SL tablet DISSOLVE 1 TABLET UNDER THE TONGUE EVERY 5 MINUTES AS NEEDED FOR CHEST PAIN. MAX 3 DOSES IN 15 MINUTES  . omeprazole (PRILOSEC) 20 MG capsule Take 20 mg by mouth daily.  . ranolazine (RANEXA) 500 MG 12 hr tablet TAKE 1 TABLET(500 MG) BY MOUTH TWICE DAILY  . vitamin B-12 (CYANOCOBALAMIN) 1000 MCG tablet Take 1,000 mcg by mouth daily.    No facility-administered encounter medications on file as of 01/03/2020.   No Known Allergies  SDOH Screenings   Alcohol Screen:   . Last Alcohol Screening Score (AUDIT):   Depression (PHQ2-9): Low Risk   . PHQ-2 Score: 0  Financial Resource Strain:   . Difficulty of Paying Living Expenses:   Food Insecurity:   . Worried About Charity fundraiser in the Last Year:   . [place] in the Last Year:   Housing:   . Last Housing Risk Score:   Physical Activity:   . Days of Exercise  per Week:   . Minutes of Exercise per Session:   Social Connections:   . Frequency of Communication with Friends and Family:   . Frequency of Social Gatherings with Friends and Family:   . Attends Religious Services:   . Active Member of Clubs or Organizations:   . Attends Archivist Meetings:   Marland Kitchen Marital Status:   Stress:   . Feeling of Stress :   Tobacco Use: High Risk  . Smoking Tobacco Use: Never Smoker  . Smokeless Tobacco Use: Current User  Transportation Needs: No Transportation Needs  .  Lack of Transportation (Medical): No  . Lack of Transportation (Non-Medical): No     Current Diagnosis/Assessment:  Goals Addressed            This Visit's Progress   . Pharmacy Care Plan       CARE PLAN ENTRY  Current Barriers:  . Chronic Disease Management support, education, and care coordination needs related to HTN, HLD, and COPD  Pharmacist Clinical Goal(s):  . Ensure safety, efficacy, and affordability of medications . Recommend maintaining BP <140/90 mmHg . Goal Cholesterol: TC <200, LDL <100, TG <150 HDL >50 mg/dL   Interventions: . Comprehensive medication review performed. . Pharmacist assisted patient with receiving Eliquis through patient assistance.  . Pharmacist will attempt to get Flovent supplied through patient assistance as soon as patient reached prescription out of pocket amount required.  . Pharmacist recommended consuming iron rich foods in diet.   Patient Self Care Activities:  . Recommend engaging in 30 minutes of moderate intensity exercise 5 times each week.  . Recommend following the Dash diet guidance of incorporating fruits, vegetables, whole-grains, low-fat dairy products, lean meats, legumes and nuts for optimal health. . Recommend keeping sodium intake less than 1500 mg/day.   Please see past updates related to this goal by clicking on the "Past Updates" button in the selected goal        Afib/ Pacemaker   Lab Results  Component Value Date   CREATININE 1.34 (H) 12/14/2019   CREATININE 1.20 08/17/2019   CREATININE 1.21 05/08/2019    Patient has failed these meds in past: aspirin 81 mg and 325 mg  Patient is currently controlled on the following medications: eliquis 5 mg bid  We discussed:  diet and exercise extensively  Plan  Continue current medications   Angina   Patient is currently rate controlled via pacemaker.  Patient has failed these meds in past: n/a Patient is currently controlled on the following medications:  ranexa 500 mg bid, nitroglycerin 0.4 mg prn, isosorbide mn 60 mg daily  We discussed:  diet and exercise extensively. Wife indicates that patient doesn't really watch sodium intake but working on not overdoing. Today had grilled pork chop and sweet potato casserole. He likes to eat vegetables and spinach for dinner. Rinsing can vegetables before cooking.   Plan  Continue current medications    Hyperlipidemia   Lipid Panel     Component Value Date/Time   CHOL 155 12/14/2019 1009   TRIG 117 12/14/2019 1009   HDL 52 12/14/2019 1009   CHOLHDL 3.0 12/14/2019 1009   CHOLHDL 2.2 04/10/2019 0448   VLDL 11 04/10/2019 0448   LDLCALC 82 12/14/2019 1009   LABVLDL 21 12/14/2019 1009     The ASCVD Risk score (Goff DC Jr., et al., 2013) failed to calculate for the following reasons:   The 2013 ASCVD risk score is only valid for ages 72 to 91  Patient has failed these meds in past: n/a Patient is currently controlled on the following medications: atorvastatin 40 mg daily  We discussed:  diet and exercise extensively. Patient limits portions and likes vegetables. Encouraged wife to continue rinsing can vegetables to reduce sodium. Wife reports that patient is beginning to ambulate better after falls and hip repair. He can now walk on level surfaces like concrete driveway but avoiding unlevel yard.   Update 01/03/2020 - Patient's wife reports good compliance with medication and no issues.   Plan  Continue current medications   Hypertension   BP today is:  <140/90  Office blood pressures are  BP Readings from Last 3 Encounters:  12/14/19 130/78  08/17/19 (!) 146/80  08/08/19 (!) 156/60    Patient has failed these meds in the past: chlorthalidone 25 mg daily Patient is currently controlled on the following medications: amlodipine 5 mg daily,  Patient checks BP at home several times per month.   Patient home BP readings are ranging: normal per wife.   We discussed diet and  exercise extensively. Patient is cautious about sodium intake and limits portions.   Update 01/03/2020 - Patient reports good control. BP at last visit was below goal.   Plan  Continue current medications   Hypothyroidism   TSH  Date Value Ref Range Status  08/17/2019 2.580 0.450 - 4.500 uIU/mL Final     Patient has failed these meds in past: n/a  Patient is currently controlled on the following medications: levothyroxine 50 mcg daily  We discussed:  Discussed importance of taking medciation daily.   Plan  Continue current medications     and  Osteoarthritis/Hx of Hip Fracture   Patient has failed these meds in past: n/a Patient is currently controlled on the following medications: calcium+d daily, hydrocodone-apap 5-325 bid prn.   We discussed:  diet and exercise extensively. Balance is a big issue. Takes him time walking in the house. Can walk in the driveway that is concrete. Discussed fall risk with hydrocodone. He takes the least amount possible.   Plan  Continue current medications   Anemia   CBC    Component Value Date/Time   WBC 5.3 12/14/2019 1009   WBC 5.1 04/12/2019 0458   RBC 4.10 (L) 12/14/2019 1009   RBC 3.76 (L) 04/12/2019 0458   HGB 12.6 (L) 12/14/2019 1009   HCT 38.0 12/14/2019 1009   PLT 212 12/14/2019 1009   MCV 93 12/14/2019 1009   MCH 30.7 12/14/2019 1009   MCH 24.7 (L) 04/12/2019 0458   MCHC 33.2 12/14/2019 1009   MCHC 32.2 04/12/2019 0458   RDW 12.3 12/14/2019 1009   LYMPHSABS 1.1 12/14/2019 1009   MONOABS 0.5 08/15/2017 0013   EOSABS 0.4 12/14/2019 1009   BASOSABS 0.0 12/14/2019 1009     Patient has failed these meds in past: folic acid 622 mcg daily Patient is currently controlled on the following medications: ferosul 325 mg tablet daily , vitamin b-12 1000 mcg daily  We discussed:  diet and exercise extensively. Patient was unable to tolerate twice daily dosing of iron. Discussed foods that are rich in iron. Patient likes  dark leafy greens.   Update 01/03/2020 - Patient's last blood work results indicated anemia. Patient reports good compliance with iron supplement daily.   Plan  Continue current medications  Gastritis   Patient has failed these meds in past: n/a Patient is currently controlled on the following medications: omeprazole  We discussed:  diet and exercise extensively. Patient  notices increased symptoms if eats something spicy. Wife indicates that he works to avoid trigger foods.   Update 01/03/2020 - Good control currently. Has some stomach pain when eats something that doesn't agree with him.   Plan  Continue current medications   COPD   Patient has failed these meds in past: n/a Patient is currently controlled on the following medications: albuterol hfa prn, Flovent 110 mcg/act - 2 puffs daily  We discussed:  Patient is compliant with maintenance inhaler and is not having to use albuterol often. His symptoms are well managed with Flovent.  Plan  Continue current medications   Vaccines   Reviewed and discussed patient's vaccination history.    Immunization History  Administered Date(s) Administered  . Janssen (J&J) SARS-COV-2 Vaccination 09/27/2019   Plan  Recommended patient receive annual flu vaccine in office.   Medication Management   Pt uses [facility] for all medications Weekly pill box. Pt endorses good compliance  We discussed: Eliquis and Flovent become very expensive during the doughnut hole phase. Patient's wife indicates that she would like to pursue patient assistance for Eliquis to avoid the doughnut hole for as long as they can.   Plan  Apply for Patient Assistance for Eliquis now. Apply for Patient Assistance for Flovent when patient has reached $600 out of pocket for the year.   Follow up: 3 months

## 2020-01-03 ENCOUNTER — Ambulatory Visit: Payer: PPO

## 2020-01-03 DIAGNOSIS — I10 Essential (primary) hypertension: Secondary | ICD-10-CM

## 2020-01-03 DIAGNOSIS — E782 Mixed hyperlipidemia: Secondary | ICD-10-CM

## 2020-01-03 NOTE — Patient Instructions (Signed)
Visit Information  Goals Addressed            This Visit's Progress    Pharmacy Care Plan       CARE PLAN ENTRY  Current Barriers:   Chronic Disease Management support, education, and care coordination needs related to HTN, HLD, and COPD  Pharmacist Clinical Goal(s):   Ensure safety, efficacy, and affordability of medications  Recommend maintaining BP <140/90 mmHg  Goal Cholesterol: TC <200, LDL <100, TG <150 HDL >50 mg/dL   Interventions:  Comprehensive medication review performed.  Pharmacist assisted patient with receiving Eliquis through patient assistance.   Pharmacist will attempt to get Flovent supplied through patient assistance as soon as patient reached prescription out of pocket amount required.   Pharmacist recommended consuming iron rich foods in diet.   Patient Self Care Activities:   Recommend engaging in 30 minutes of moderate intensity exercise 5 times each week.   Recommend following the [employer] guidance of incorporating fruits, vegetables, whole-grains, low-fat dairy products, lean meats, legumes and nuts for optimal health.  Recommend keeping sodium intake less than 1500 mg/day.   Please see past updates related to this goal by clicking on the "Past Updates" button in the selected goal         The patient verbalized understanding of instructions provided today and declined a print copy of patient instruction materials.   Telephone follow up appointment with pharmacy team member scheduled for: 03/2020  Sherre Poot, PharmD, [facility] Clinical Pharmacist [facility] [phone] (office) [phone] (mobile)

## 2020-01-15 ENCOUNTER — Other Ambulatory Visit: Payer: Self-pay

## 2020-01-18 ENCOUNTER — Other Ambulatory Visit: Payer: Self-pay

## 2020-01-18 MED ORDER — HYDROCODONE-ACETAMINOPHEN 5-325 MG PO TABS: 1.0000 | ORAL_TABLET | Freq: Two times a day (BID) | ORAL | 0 refills | Status: DC | PRN

## 2020-01-22 ENCOUNTER — Encounter: Payer: Self-pay | Admitting: Cardiology

## 2020-01-22 ENCOUNTER — Ambulatory Visit: Payer: PPO | Admitting: Cardiology

## 2020-01-22 ENCOUNTER — Other Ambulatory Visit: Payer: Self-pay

## 2020-01-22 VITALS — BP 130/78 | HR 80 | Ht 63.39 in | Wt 189.8 lb

## 2020-01-22 DIAGNOSIS — I442 Atrioventricular block, complete: Secondary | ICD-10-CM | POA: Diagnosis not present

## 2020-01-22 DIAGNOSIS — E782 Mixed hyperlipidemia: Secondary | ICD-10-CM

## 2020-01-22 DIAGNOSIS — Z950 Presence of cardiac pacemaker: Secondary | ICD-10-CM | POA: Diagnosis not present

## 2020-01-22 DIAGNOSIS — I739 Peripheral vascular disease, unspecified: Secondary | ICD-10-CM | POA: Diagnosis not present

## 2020-01-22 DIAGNOSIS — I480 Paroxysmal atrial fibrillation: Secondary | ICD-10-CM | POA: Diagnosis not present

## 2020-01-22 NOTE — Patient Instructions (Signed)

## 2020-01-22 NOTE — Progress Notes (Signed)
Cardiology Office Note:    Date:  01/22/2020   ID:  [name], DOB 1934-10-23, MRN [id number]  PCP:  Lillard Anes, MD  Cardiologist:  Jenne Campus, MD    Referring MD: Lillard Anes,*   No chief complaint on file. I am doing well  History of Present Illness:    [name] is a 84 y.o. male with past medical history significant for paroxysmal atrial fibrillation, history of CVA, essential hypertension, complete heart block with a pacemaker.  Comes today 2 months of follow-up.  Overall doing well.  Denies have any chest pain tightness squeezing pressure burning chest.  He does describe the fact that he slowing down.  He used to go to the shop and work a lot now he does not do it.  Denies have any chest pain tightness squeezing pressure burning chest  Past Medical History:  Diagnosis Date  . Arthritis   . Asthma   . Carotid artery disease ([place])    a. h/o occluded R ICA.  Marland Kitchen CHB (complete heart block) (HCC)    a. s/p ppm.  . Chronic anemia   . CKD (chronic kidney disease), stage III   . Coronary artery disease    a. moderate nonobstructive CAD by cath in 2018.  Marland Kitchen GERD (gastroesophageal reflux disease)   . Hypertension   . Hypothyroidism   . PAF (paroxysmal atrial fibrillation) ([place])   . Peripheral vascular disease [facility])     Past Surgical History:  Procedure Laterality Date  . BIOPSY  04/11/2019   Procedure: BIOPSY;  Surgeon: Milus Banister, MD;  Location: [place];  Service: Endoscopy;;  . CATARACT EXTRACTION    . COLONOSCOPY WITH PROPOFOL N/A 04/11/2019   Procedure: COLONOSCOPY WITH PROPOFOL;  Surgeon: Milus Banister, MD;  Location[facility] ENDOSCOPY;  Service: Endoscopy;  Laterality: N/A;  . ESOPHAGOGASTRODUODENOSCOPY (EGD) WITH PROPOFOL N/A 04/11/2019   Procedure: ESOPHAGOGASTRODUODENOSCOPY (EGD) WITH PROPOFOL;  Surgeon: Milus Banister, MD;  Location: [facility] ENDOSCOPY;  Service: Endoscopy;  Laterality: N/A;  . HIP SURGERY    . INSERT /  REPLACE / REMOVE PACEMAKER     [employer]  . JOINT REPLACEMENT Right 10/20/1994   Hip  (Left Hip in 10/96)  . LEFT HEART CATH AND CORONARY ANGIOGRAPHY N/A 02/08/2017   Procedure: LEFT HEART CATH AND CORONARY ANGIOGRAPHY;  Surgeon: Martinique, Peter M, MD;  Location: [place] CV LAB;  Service: Cardiovascular;  Laterality: N/A;  . ORIF PERIPROSTHETIC FRACTURE Right 08/17/2017   Procedure: OPEN REDUCTION INTERNAL FIXATION (ORIF) PERIPROSTHETIC FRACTURE RIGHT FEMUR;  Surgeon: Paralee Cancel, MD;  Location: [facility];  Service: Orthopedics;  Laterality: Right;  . ORIF PERIPROSTHETIC FRACTURE Right 01/23/2018   Procedure: Redo open reduction internal fixation right periprosthetic femur fracture;  Surgeon: Paralee Cancel, MD;  Location: [facility];  Service: Orthopedics;  Laterality: Right;  120 mins  . POLYPECTOMY  04/11/2019   Procedure: POLYPECTOMY;  Surgeon: Milus Banister, MD;  Location: [facility] ENDOSCOPY;  Service: Endoscopy;;    Current Medications: Current Meds  Medication Sig  . albuterol (PROVENTIL HFA;VENTOLIN HFA) 108 (90 Base) MCG/ACT inhaler Inhale 2 puffs into the lungs 4 (four) times daily as needed for wheezing or shortness of breath.  Marland Kitchen amLODipine (NORVASC) 5 MG tablet Take 1 tablet (5 mg total) by mouth at bedtime.  Marland Kitchen apixaban (ELIQUIS) 5 MG TABS tablet Take 1 tablet (5 mg total) by mouth 2 (two) times daily.  Marland Kitchen atorvastatin (LIPITOR) 40 MG tablet TAKE 1 TABLET BY  MOUTH DAILY  . Calcium Carbonate-Vitamin D (CALCIUM 500 + D PO) Take 1 tablet by mouth daily.  . FEROSUL 325 (65 Fe) MG tablet Take 325 mg by mouth daily.   Marland Kitchen FLOVENT HFA 110 MCG/ACT inhaler INHALE 2 PUFFS BY MOUTH TWICE DAILY  . HYDROcodone-acetaminophen (NORCO/VICODIN) 5-325 MG tablet Take 1 tablet by mouth 2 (two) times daily as needed (pain).  . isosorbide mononitrate (IMDUR) 60 MG 24 hr tablet TAKE 1 TABLET(60 MG) BY MOUTH DAILY  . levothyroxine (SYNTHROID, LEVOTHROID) 50 MCG tablet Take 50 mcg by mouth at bedtime.   . Multiple  Vitamin (MULTIVITAMIN WITH MINERALS) TABS tablet Take 1 tablet by mouth daily.  . nitroGLYCERIN (NITROSTAT) 0.4 MG SL tablet DISSOLVE 1 TABLET UNDER THE TONGUE EVERY 5 MINUTES AS NEEDED FOR CHEST PAIN. MAX 3 DOSES IN 15 MINUTES  . omeprazole (PRILOSEC) 20 MG capsule Take 20 mg by mouth daily.  . ranolazine (RANEXA) 500 MG 12 hr tablet TAKE 1 TABLET(500 MG) BY MOUTH TWICE DAILY  . vitamin B-12 (CYANOCOBALAMIN) 1000 MCG tablet Take 1,000 mcg by mouth daily.      Allergies:   Patient has no known allergies.   Social History   Socioeconomic History  . Marital status: Married    Spouse name: Not on file  . Number of children: 2  . Years of education: Not on file  . Highest education level: 6th grade  Occupational History  . Occupation: Retired  Tobacco Use  . Smoking status: Never Smoker  . Smokeless tobacco: Current User    Types: Chew  . Tobacco comment: 1 can per day  Vaping Use  . Vaping Use: Never used  Substance and Sexual Activity  . Alcohol use: Yes    Alcohol/week: 3.0 standard drinks    Types: 3 Cans of beer per week    Comment: regular use 4 times a week  . Drug use: No  . Sexual activity: Not Currently    Partners: Female    Birth control/protection: None  Other Topics Concern  . Not on file  Social History Narrative  . Not on file   Social Determinants of Health   Financial Resource Strain:   . Difficulty of Paying Living Expenses:   Food Insecurity:   . Worried About Charity fundraiser in the Last Year:   . Arboriculturist in the Last Year:   Transportation Needs: No Transportation Needs  . Lack of Transportation (Medical): No  . Lack of Transportation (Non-Medical): No  Physical Activity:   . Days of Exercise per Week:   . Minutes of Exercise per Session:   Stress:   . Feeling of Stress :   Social Connections:   . Frequency of Communication with Friends and Family:   . Frequency of Social Gatherings with Friends and Family:   . Attends Religious  Services:   . Active Member of Clubs or Organizations:   . Attends Archivist Meetings:   Marland Kitchen Marital Status:      Family History: The patient's family history includes Heart attack in his mother. ROS:   Please see the history of present illness.    All 14 point review of systems negative except as described per history of present illness  EKGs/Labs/Other Studies Reviewed:      Recent Labs: 04/09/2019: B Natriuretic Peptide 286.1 08/17/2019: TSH 2.580 12/14/2019: ALT 13; BUN 15; Creatinine, Ser 1.34; Hemoglobin 12.6; Platelets 212; Potassium 4.7; Sodium 139  Recent Lipid Panel  Component Value Date/Time   CHOL 155 12/14/2019 1009   TRIG 117 12/14/2019 1009   HDL 52 12/14/2019 1009   CHOLHDL 3.0 12/14/2019 1009   CHOLHDL 2.2 04/10/2019 0448   VLDL 11 04/10/2019 0448   LDLCALC 82 12/14/2019 1009    Physical Exam:    VS:  BP 130/78 (BP Location: Left Arm, Patient Position: Sitting, Cuff Size: Normal)   Pulse 80   Ht 5' 3.39" (1.61 m)   Wt 189 lb 12.8 oz (86.1 kg)   SpO2 95%   BMI 33.21 kg/m     Wt Readings from Last 3 Encounters:  01/22/20 189 lb 12.8 oz (86.1 kg)  12/14/19 185 lb (83.9 kg)  08/17/19 186 lb (84.4 kg)     GEN:  Well nourished, well developed in no acute distress HEENT: Normal NECK: No JVD; No carotid bruits LYMPHATICS: No lymphadenopathy CARDIAC: RRR, no murmurs, no rubs, no gallops RESPIRATORY:  Clear to auscultation without rales, wheezing or rhonchi  ABDOMEN: Soft, non-tender, non-distended MUSCULOSKELETAL:  No edema; No deformity  SKIN: Warm and dry LOWER EXTREMITIES: no swelling NEUROLOGIC:  Alert and oriented x 3 PSYCHIATRIC:  Normal affect   ASSESSMENT:    1. Paroxysmal atrial fibrillation (HCC)   2. Peripheral vascular disease ([place])   3. Complete heart block ([place])   4. Pacemaker   5. Mixed hyperlipidemia    PLAN:    In order of problems listed above:  1. Paroxysmal atrial fibrillation seems to be in normal rhythm he  is anticoagulated which I will continue. 2. Peripheral vascular disease with completely occluded carotid artery and doing well from that point we will continue present management. 3. Complete heart block: That being addressed with a pacemaker. 4. Dyslipidemia: I did review K PN which showed me he is LDL of 54 HDL 52 this is from 12/14/2019.  We will continue present management which include high intensity statin Lipitor 40 mg daily.   Medication Adjustments/Labs and Tests Ordered: Current medicines are reviewed at length with the patient today.  Concerns regarding medicines are outlined above.  No orders of the defined types were placed in this encounter.  Medication changes: No orders of the defined types were placed in this encounter.   Signed, Park Liter, MD, [facility] 01/22/2020 3:54 PM    [place]

## 2020-02-18 ENCOUNTER — Other Ambulatory Visit: Payer: Self-pay | Admitting: Family

## 2020-02-18 ENCOUNTER — Ambulatory Visit (INDEPENDENT_AMBULATORY_CARE_PROVIDER_SITE_OTHER): Payer: PPO | Admitting: *Deleted

## 2020-02-18 ENCOUNTER — Other Ambulatory Visit: Payer: Self-pay

## 2020-02-18 DIAGNOSIS — I442 Atrioventricular block, complete: Secondary | ICD-10-CM | POA: Diagnosis not present

## 2020-02-18 DIAGNOSIS — I2510 Atherosclerotic heart disease of native coronary artery without angina pectoris: Secondary | ICD-10-CM

## 2020-02-18 LAB — CUP PACEART REMOTE DEVICE CHECK
Battery Impedance: 902 Ohm
Battery Remaining Longevity: 56 mo
Battery Voltage: 2.77 V
Brady Statistic AP VP Percent: 17 %
Brady Statistic AP VS Percent: 0 %
Brady Statistic AS VP Percent: 83 %
Brady Statistic AS VS Percent: 0 %
Date Time Interrogation Session: 20210830100521
Implantable Lead Implant Date: 20150319
Implantable Lead Implant Date: 20150319
Implantable Lead Location: 753859
Implantable Lead Location: 753860
Implantable Lead Model: 4092
Implantable Lead Model: 5076
Implantable Pulse Generator Implant Date: 20150319
Lead Channel Impedance Value: 403 Ohm
Lead Channel Impedance Value: 516 Ohm
Lead Channel Pacing Threshold Amplitude: 0.875 V
Lead Channel Pacing Threshold Amplitude: 1.5 V
Lead Channel Pacing Threshold Pulse Width: 0.4 ms
Lead Channel Pacing Threshold Pulse Width: 0.4 ms
Lead Channel Setting Pacing Amplitude: 1.75 V
Lead Channel Setting Pacing Amplitude: 3 V
Lead Channel Setting Pacing Pulse Width: 0.4 ms
Lead Channel Setting Sensing Sensitivity: 2 mV

## 2020-02-18 MED ORDER — HYDROCODONE-ACETAMINOPHEN 5-325 MG PO TABS
1.0000 | ORAL_TABLET | Freq: Two times a day (BID) | ORAL | 0 refills | Status: DC | PRN
Start: 2020-02-18 — End: 2020-03-20

## 2020-02-20 NOTE — Progress Notes (Signed)
Remote pacemaker transmission.   

## 2020-03-20 ENCOUNTER — Other Ambulatory Visit: Payer: Self-pay

## 2020-03-20 MED ORDER — HYDROCODONE-ACETAMINOPHEN 5-325 MG PO TABS: 1.0000 | ORAL_TABLET | Freq: Two times a day (BID) | ORAL | 0 refills | Status: DC | PRN

## 2020-04-03 ENCOUNTER — Ambulatory Visit: Payer: PPO

## 2020-04-03 ENCOUNTER — Other Ambulatory Visit: Payer: Self-pay

## 2020-04-03 DIAGNOSIS — I10 Essential (primary) hypertension: Secondary | ICD-10-CM

## 2020-04-03 DIAGNOSIS — E782 Mixed hyperlipidemia: Secondary | ICD-10-CM

## 2020-04-03 NOTE — Chronic Care Management (AMB) (Signed)
Chronic Care Management Pharmacy  Name: [name]  MRN: [id number] DOB: [birth year]  Chief Complaint/ HPI  [name],  85 y.o. , male presents for their Follow-Up CCM visit with the clinical pharmacist via telephone due to COVID-19 Pandemic.  PCP : Scott Anes, MD  Their chronic conditions include: angina, afib, HTN, gastritis, hypothyroidism, OA of both knees, CKD, Prostate cancer, HLD, Anemia.   Office Visits:  12/14/2019 -  No medication changes. RTC in 4 months. Kidney tests stable, liver tests normal, mild anemia, Cholesterol normal,  08/17/2019 - follow-up to adding amlodipine. No med changes.  06/18/2019 - Bursitis of elbow. No new med orders and refilled hydrocodone.  04/17/2019 - Follow-up TOC visit. Anemia noted started ferrous sulfate. Consult Visit: 01/22/2020 - Cardiology [name] - continue anticoag. Continue high intensity statin.  08/08/2019 - Dr. Raliegh Ruiz visit. No med changes. Check BP at home and bring to Dr. Raliegh Ruiz.  05/08/2019 -.Dr. Raliegh Ruiz. No med changes. continue current anemia treatment.  04/19/2019 Scott Montana NP - no changes. Patient doesn't meet qualification of reduced dosing of Eliquis.  04/11/2019 - Dr. Eugenia Ruiz - Colon- 1 cecum polyp, 1 transverse polyp, EGD- Duodenal polyp biopsy, gastric biopsy for h.pylori, hiatal hernia  Medications: 04/09/2019 - hospitalized and given blood for anemia.  04/09/2019 - Decompensated heart failure transmitted to hospital via ambulance.   Outpatient Encounter Medications as of 04/03/2020  Medication Sig  . albuterol (PROVENTIL HFA;VENTOLIN HFA) 108 (90 Base) MCG/ACT inhaler Inhale 2 puffs into the lungs 4 (four) times daily as needed for wheezing or shortness of breath.  Marland Kitchen amLODipine (NORVASC) 5 MG tablet Take 1 tablet (5 mg total) by mouth at bedtime.  Marland Kitchen apixaban (ELIQUIS) 5 MG TABS tablet Take 1 tablet (5 mg total) by mouth 2 (two) times daily.  Marland Kitchen atorvastatin (LIPITOR) 40 MG tablet TAKE 1 TABLET BY  MOUTH DAILY  . Calcium Carbonate-Vitamin D (CALCIUM 500 + D PO) Take 1 tablet by mouth daily.  . FEROSUL 325 (65 Fe) MG tablet Take 325 mg by mouth daily.   Marland Kitchen FLOVENT HFA 110 MCG/ACT inhaler INHALE 2 PUFFS BY MOUTH TWICE DAILY  . HYDROcodone-acetaminophen (NORCO/VICODIN) 5-325 MG tablet Take 1 tablet by mouth 2 (two) times daily as needed (pain).  . isosorbide mononitrate (IMDUR) 60 MG 24 hr tablet TAKE 1 TABLET(60 MG) BY MOUTH DAILY  . levothyroxine (SYNTHROID, LEVOTHROID) 50 MCG tablet Take 50 mcg by mouth at bedtime.   . Multiple Vitamin (MULTIVITAMIN WITH MINERALS) TABS tablet Take 1 tablet by mouth daily.  . nitroGLYCERIN (NITROSTAT) 0.4 MG SL tablet DISSOLVE 1 TABLET UNDER THE TONGUE EVERY 5 MINUTES AS NEEDED FOR CHEST PAIN. MAX 3 DOSES IN 15 MINUTES  . omeprazole (PRILOSEC) 20 MG capsule Take 20 mg by mouth daily.  . ranolazine (RANEXA) 500 MG 12 hr tablet TAKE 1 TABLET(500 MG) BY MOUTH TWICE DAILY  . vitamin B-12 (CYANOCOBALAMIN) 1000 MCG tablet Take 1,000 mcg by mouth daily.    No facility-administered encounter medications on file as of 04/03/2020.   No Known Allergies  SDOH Screenings   Alcohol Screen:   . Last Alcohol Screening Score (AUDIT): Not on file  Depression (PHQ2-9): Low Risk   . PHQ-2 Score: 0  Financial Resource Strain:   . Difficulty of Paying Living Expenses: Not on file  Food Insecurity:   . Worried About Charity fundraiser in the Last Year: Not on file  . Ran Out of Food in the Last Year: Not on  file  Housing:   . Last Housing Risk Score: Not on file  Physical Activity:   . Days of Exercise per Week: Not on file  . Minutes of Exercise per Session: Not on file  Social Connections:   . Frequency of Communication with Friends and Family: Not on file  . Frequency of Social Gatherings with Friends and Family: Not on file  . Attends Religious Services: Not on file  . Active Member of Clubs or Organizations: Not on file  . Attends Archivist  Meetings: Not on file  . Marital Status: Not on file  Stress:   . Feeling of Stress : Not on file  Tobacco Use: High Risk  . Smoking Tobacco Use: Never Smoker  . Smokeless Tobacco Use: Current User  Transportation Needs: No Transportation Needs  . Lack of Transportation (Medical): No  . Lack of Transportation (Non-Medical): No     Current Diagnosis/Assessment:  Goals Addressed            This Visit's Progress   . Pharmacy Care Plan       CARE PLAN ENTRY (see longitudinal plan of care for additional care plan information)  Current Barriers:  . Chronic Disease Management support, education, and care coordination needs related to Hypertension, Hyperlipidemia, Atrial Fibrillation, and COPD   Hypertension BP Readings from Last 3 Encounters:  01/22/20 130/78  12/14/19 130/78  08/17/19 (!) 146/80   . Pharmacist Clinical Goal(s): o Over the next 90 days, patient will work with PharmD and providers to maintain BP goal <140/90 . Current regimen:  o Amlodipine 5 mg daily . Interventions: o Reviewed home blood pressure readings.  . Patient self care activities - Over the next 90 days, patient will: o Check BP monthly, document, and provide at future appointments o Ensure daily salt intake < 2300 mg/day  Hyperlipidemia Lab Results  Component Value Date/Time   LDLCALC 82 12/14/2019 10:09 AM   . Pharmacist Clinical Goal(s): o Over the next 90 days, patient will work with PharmD and providers to maintain LDL goal < 100 . Current regimen:  o Atorvastatin 40 mg daily . Interventions: o Reviewed updated cholesterol panel.  . Patient self care activities - Over the next 90 days, patient will: o Continue taking medication as prescribed.  Afib . Pharmacist Clinical Goal(s): o Over the next 90 days, patient will work with PharmD and providers to manage atrial fibrillation risks. . Current regimen:  o Eliquis 5 mg bid . Interventions: o Patient assistance approved for 2021.   o Pharmacist will assist patient in coordinating updated application for [id number].  Marland Kitchen Patient self care activities - Over the next 90 days, patient will: o Continue taking medication as prescribed.   Medication management . Pharmacist Clinical Goal(s): o Over the next 90 days, patient will work with PharmD and providers to maintain optimal medication adherence . Current pharmacy: [facility] . Interventions o Comprehensive medication review performed. o Continue current medication management strategy . Patient self care activities - Over the next 90 days, patient will: o Focus on medication adherence by using pill box.  o Take medications as prescribed o Report any questions or concerns to PharmD and/or provider(s)  Please see past updates related to this goal by clicking on the "Past Updates" button in the selected goal        Afib/ Pacemaker   Patient is currently rate controlled via pacemaker.  Lab Results  Component Value Date   CREATININE 1.34 (H) 12/14/2019  CREATININE 1.20 08/17/2019   CREATININE 1.21 05/08/2019    Patient has failed these meds in past: aspirin 81 mg and 325 mg  Patient is currently controlled on the following medications:   eliquis 5 mg bid  We discussed:  diet and exercise extensively   Update 04/03/2020 - Patient reports well controlled. Eliquis supplied by patient assistance this year and pharmacist will renew for next year.   Plan  Continue current medications   Angina   Patient has failed these meds in past: n/a Patient is currently controlled on the following medications:   ranexa 500 mg bid   nitroglycerin 0.4 mg prn  isosorbide mn 60 mg daily  We discussed:  diet and exercise extensively.   Update 04/03/2020: Patient reports well controlled currently. Denies symptoms.   Plan  Continue current medications    Hyperlipidemia   Lipid Panel     Component Value Date/Time   CHOL 155 12/14/2019 1009   TRIG 117 12/14/2019  1009   HDL 52 12/14/2019 1009   CHOLHDL 3.0 12/14/2019 1009   CHOLHDL 2.2 04/10/2019 0448   VLDL 11 04/10/2019 0448   LDLCALC 82 12/14/2019 1009   LABVLDL 21 12/14/2019 1009     The ASCVD Risk score (Goff DC Jr., et al., 2013) failed to calculate for the following reasons:   The 2013 ASCVD risk score is only valid for ages 64 to 53   Patient has failed these meds in past: n/a Patient is currently controlled on the following medications:  atorvastatin 40 mg daily  We discussed:  diet and exercise extensively.   Update 04/07/2020: Patient reports tolerating medication well. Adherence data shows 90-99% adherent.    Plan  Continue current medications   Hypertension   BP today is:  <140/90  Office blood pressures are  BP Readings from Last 3 Encounters:  01/22/20 130/78  12/14/19 130/78  08/17/19 (!) 146/80    Patient has failed these meds in the past: chlorthalidone 25 mg daily Patient is currently controlled on the following medications:   amlodipine 5 mg daily,  Patient checks BP at home several times per month.   Patient home BP readings are ranging: normal per wife.   We discussed diet and exercise extensively.   Update 04/07/2020 - Patient reports stable bp. Wife not present for visit so specific numbers not available.  Plan  Continue current medications   Hypothyroidism   TSH  Date Value Ref Range Status  08/17/2019 2.580 0.450 - 4.500 uIU/mL Final     Patient has failed these meds in past: n/a  Patient is currently controlled on the following medications: levothyroxine 50 mcg daily  We discussed:  Discussed importance of taking medciation daily.    Plan  Continue current medications     and  Osteoarthritis/Hx of Hip Fracture   Patient has failed these meds in past: n/a Patient is currently controlled on the following medications: calcium+d daily, hydrocodone-apap 5-325 bid prn.   We discussed:  diet and exercise extensively. Balance is a  big issue. Takes him time walking in the house. Can walk in the driveway that is concrete. Discussed fall risk with hydrocodone. He takes the least amount possible.   Plan  Continue current medications   Anemia   CBC    Component Value Date/Time   WBC 5.3 12/14/2019 1009   WBC 5.1 04/12/2019 0458   RBC 4.10 (L) 12/14/2019 1009   RBC 3.76 (L) 04/12/2019 0458   HGB 12.6 (L) 12/14/2019 1009  HCT 38.0 12/14/2019 1009   PLT 212 12/14/2019 1009   MCV 93 12/14/2019 1009   MCH 30.7 12/14/2019 1009   MCH 24.7 (L) 04/12/2019 0458   MCHC 33.2 12/14/2019 1009   MCHC 32.2 04/12/2019 0458   RDW 12.3 12/14/2019 1009   LYMPHSABS 1.1 12/14/2019 1009   MONOABS 0.5 08/15/2017 0013   EOSABS 0.4 12/14/2019 1009   BASOSABS 0.0 12/14/2019 1009     Patient has failed these meds in past: folic acid 573 mcg daily Patient is currently controlled on the following medications: ferosul 325 mg tablet daily , vitamin b-12 1000 mcg daily  We discussed:  diet and exercise extensively. Patient was unable to tolerate twice daily dosing of iron. Discussed foods that are rich in iron. Patient likes dark leafy greens.    Plan  Continue current medications  Gastritis   Patient has failed these meds in past: n/a Patient is currently controlled on the following medications: omeprazole  We discussed:  diet and exercise extensively. Patient notices increased symptoms if eats something spicy. Wife indicates that he works to avoid trigger foods.   Update 04/03/2020 - Patient denies issues at this time.   Plan  Continue current medications   COPD   Patient has failed these meds in past: n/a Patient is currently controlled on the following medications:   albuterol hfa prn  Flovent 110 mcg/act - 2 puffs daily  We discussed:  Patient is compliant with maintenance inhaler and is not having to use albuterol often. His symptoms are well managed with Flovent.  Update 04/03/2020: Patient denies concerns  at this time and reports well managed.   Plan  Continue current medications   Vaccines   Reviewed and discussed patient's vaccination history.    Immunization History  Administered Date(s) Administered  . Janssen (J&J) SARS-COV-2 Vaccination 09/27/2019   Plan  Recommended patient receive annual flu vaccine in office.   Medication Management   Pt uses [facility] for all medications Weekly pill box. Pt endorses good compliance  We discussed: Eliquis renewal for 2022 and pharmacist will coordinate. .   Plan  Pharmacist will coordinate application for 2202 Eliquis patient assistance coverage.   Follow up: 6 months

## 2020-04-07 NOTE — Patient Instructions (Signed)
Visit Information  Goals Addressed            This Visit's Progress   . Pharmacy Care Plan       CARE PLAN ENTRY (see longitudinal plan of care for additional care plan information)  Current Barriers:  . Chronic Disease Management support, education, and care coordination needs related to Hypertension, Hyperlipidemia, Atrial Fibrillation, and COPD   Hypertension BP Readings from Last 3 Encounters:  01/22/20 130/78  12/14/19 130/78  08/17/19 (!) 146/80   . Pharmacist Clinical Goal(s): o Over the next 90 days, patient will work with PharmD and providers to maintain BP goal <140/90 . Current regimen:  o Amlodipine 5 mg daily . Interventions: o Reviewed home blood pressure readings.  . Patient self care activities - Over the next 90 days, patient will: o Check BP monthly, document, and provide at future appointments o Ensure daily salt intake < 2300 mg/day  Hyperlipidemia Lab Results  Component Value Date/Time   LDLCALC 82 12/14/2019 10:09 AM   . Pharmacist Clinical Goal(s): o Over the next 90 days, patient will work with PharmD and providers to maintain LDL goal < 100 . Current regimen:  o Atorvastatin 40 mg daily . Interventions: o Reviewed updated cholesterol panel.  . Patient self care activities - Over the next 90 days, patient will: o Continue taking medication as prescribed.  Afib . Pharmacist Clinical Goal(s): o Over the next 90 days, patient will work with PharmD and providers to manage atrial fibrillation risks. . Current regimen:  o Eliquis 5 mg bid . Interventions: o Patient assistance approved for 2021.  o Pharmacist will assist patient in coordinating updated application for [id number].  Marland Kitchen Patient self care activities - Over the next 90 days, patient will: o Continue taking medication as prescribed.   Medication management . Pharmacist Clinical Goal(s): o Over the next 90 days, patient will work with PharmD and providers to maintain optimal medication  adherence . Current pharmacy: [facility] . Interventions o Comprehensive medication review performed. o Continue current medication management strategy . Patient self care activities - Over the next 90 days, patient will: o Focus on medication adherence by using pill box.  o Take medications as prescribed o Report any questions or concerns to PharmD and/or provider(s)  Please see past updates related to this goal by clicking on the "Past Updates" button in the selected goal         The patient verbalized understanding of instructions provided today and declined a print copy of patient instruction materials.   Telephone follow up appointment with pharmacy team member scheduled for: April 2022  Sherre Poot, PharmD, [facility] Clinical Pharmacist [facility] [phone] (office) [phone] (mobile)   Managing Your Hypertension Hypertension is commonly called high blood pressure. This is when the force of your blood pressing against the walls of your arteries is too strong. Arteries are blood vessels that carry blood from your heart throughout your body. Hypertension forces the heart to work harder to pump blood, and may cause the arteries to become narrow or stiff. Having untreated or uncontrolled hypertension can cause heart attack, stroke, kidney disease, and other problems. What are blood pressure readings? A blood pressure reading consists of a higher number over a lower number. Ideally, your blood pressure should be below 120/80. The first ("top") number is called the systolic pressure. It is a measure of the pressure in your arteries as your heart beats. The second ("bottom") number is called the diastolic pressure. It  is a measure of the pressure in your arteries as the heart relaxes. What does my blood pressure reading mean? Blood pressure is classified into four stages. Based on your blood pressure reading, your health care provider may use the following stages to  determine what type of treatment you need, if any. Systolic pressure and diastolic pressure are measured in a unit called mm Hg. Normal  Systolic pressure: below 604.  Diastolic pressure: below 80. Elevated  Systolic pressure: 540-981.  Diastolic pressure: below 80. Hypertension stage 1  Systolic pressure: 191-478.  Diastolic pressure: 29-56. Hypertension stage 2  Systolic pressure: 213 or above.  Diastolic pressure: 90 or above. What health risks are associated with hypertension? Managing your hypertension is an important responsibility. Uncontrolled hypertension can lead to:  A heart attack.  A stroke.  A weakened blood vessel (aneurysm).  Heart failure.  Kidney damage.  Eye damage.  Metabolic syndrome.  Memory and concentration problems. What changes can I make to manage my hypertension? Hypertension can be managed by making lifestyle changes and possibly by taking medicines. Your health care provider will help you make a plan to bring your blood pressure within a normal range. Eating and drinking   Eat a diet that is high in fiber and potassium, and low in salt (sodium), added sugar, and fat. An example eating plan is called the DASH (Dietary Approaches to Stop Hypertension) diet. To eat this way: ? Eat plenty of fresh fruits and vegetables. Try to fill half of your plate at each meal with fruits and vegetables. ? Eat whole grains, such as whole wheat pasta, Raahil Ong rice, or whole grain bread. Fill about one quarter of your plate with whole grains. ? Eat low-fat diary products. ? Avoid fatty cuts of meat, processed or cured meats, and poultry with skin. Fill about one quarter of your plate with lean proteins such as fish, chicken without skin, beans, eggs, and tofu. ? Avoid premade and processed foods. These tend to be higher in sodium, added sugar, and fat.  Reduce your daily sodium intake. Most people with hypertension should eat less than 1,500 mg of sodium a  day.  Limit alcohol intake to no more than 1 drink a day for nonpregnant women and 2 drinks a day for men. One drink equals 12 oz of beer, 5 oz of wine, or 1 oz of hard liquor. Lifestyle  Work with your health care provider to maintain a healthy body weight, or to lose weight. Ask what an ideal weight is for you.  Get at least 30 minutes of exercise that causes your heart to beat faster (aerobic exercise) most days of the week. Activities may include walking, swimming, or biking.  Include exercise to strengthen your muscles (resistance exercise), such as weight lifting, as part of your weekly exercise routine. Try to do these types of exercises for 30 minutes at least 3 days a week.  Do not use any products that contain nicotine or tobacco, such as cigarettes and e-cigarettes. If you need help quitting, ask your health care provider.  Control any long-term (chronic) conditions you have, such as high cholesterol or diabetes. Monitoring  Monitor your blood pressure at home as told by your health care provider. Your personal target blood pressure may vary depending on your medical conditions, your age, and other factors.  Have your blood pressure checked regularly, as often as told by your health care provider. Working with your health care provider  Review all the medicines you take  with your health care provider because there may be side effects or interactions.  Talk with your health care provider about your diet, exercise habits, and other lifestyle factors that may be contributing to hypertension.  Visit your health care provider regularly. Your health care provider can help you create and adjust your plan for managing hypertension. Will I need medicine to control my blood pressure? Your health care provider may prescribe medicine if lifestyle changes are not enough to get your blood pressure under control, and if:  Your systolic blood pressure is 130 or higher.  Your diastolic  blood pressure is 80 or higher. Take medicines only as told by your health care provider. Follow the directions carefully. Blood pressure medicines must be taken as prescribed. The medicine does not work as well when you skip doses. Skipping doses also puts you at risk for problems. Contact a health care provider if:  You think you are having a reaction to medicines you have taken.  You have repeated (recurrent) headaches.  You feel dizzy.  You have swelling in your ankles.  You have trouble with your vision. Get help right away if:  You develop a severe headache or confusion.  You have unusual weakness or numbness, or you feel faint.  You have severe pain in your chest or abdomen.  You vomit repeatedly.  You have trouble breathing. Summary  Hypertension is when the force of blood pumping through your arteries is too strong. If this condition is not controlled, it may put you at risk for serious complications.  Your personal target blood pressure may vary depending on your medical conditions, your age, and other factors. For most people, a normal blood pressure is less than 120/80.  Hypertension is managed by lifestyle changes, medicines, or both. Lifestyle changes include weight loss, eating a healthy, low-sodium diet, exercising more, and limiting alcohol. This information is not intended to replace advice given to you by your health care provider. Make sure you discuss any questions you have with your health care provider. Document Revised: 09/29/2018 Document Reviewed: 05/05/2016 Elsevier Patient Education  [place].

## 2020-04-09 ENCOUNTER — Other Ambulatory Visit: Payer: Self-pay | Admitting: Legal Medicine

## 2020-04-09 ENCOUNTER — Other Ambulatory Visit: Payer: Self-pay | Admitting: Cardiology

## 2020-04-09 NOTE — Telephone Encounter (Signed)
Rx refill sent to pharmacy. 

## 2020-04-17 ENCOUNTER — Other Ambulatory Visit: Payer: Self-pay

## 2020-04-17 ENCOUNTER — Encounter: Payer: Self-pay | Admitting: Legal Medicine

## 2020-04-17 ENCOUNTER — Ambulatory Visit (INDEPENDENT_AMBULATORY_CARE_PROVIDER_SITE_OTHER): Payer: PPO | Admitting: Legal Medicine

## 2020-04-17 VITALS — BP 130/76 | HR 87 | Temp 97.8°F | Ht 64.0 in | Wt 191.0 lb

## 2020-04-17 DIAGNOSIS — E039 Hypothyroidism, unspecified: Secondary | ICD-10-CM | POA: Diagnosis not present

## 2020-04-17 DIAGNOSIS — E782 Mixed hyperlipidemia: Secondary | ICD-10-CM

## 2020-04-17 DIAGNOSIS — I10 Essential (primary) hypertension: Secondary | ICD-10-CM | POA: Diagnosis not present

## 2020-04-17 DIAGNOSIS — K2970 Gastritis, unspecified, without bleeding: Secondary | ICD-10-CM

## 2020-04-17 DIAGNOSIS — I480 Paroxysmal atrial fibrillation: Secondary | ICD-10-CM | POA: Diagnosis not present

## 2020-04-17 DIAGNOSIS — I2510 Atherosclerotic heart disease of native coronary artery without angina pectoris: Secondary | ICD-10-CM

## 2020-04-17 DIAGNOSIS — K2990 Gastroduodenitis, unspecified, without bleeding: Secondary | ICD-10-CM | POA: Diagnosis not present

## 2020-04-17 DIAGNOSIS — I5043 Acute on chronic combined systolic (congestive) and diastolic (congestive) heart failure: Secondary | ICD-10-CM | POA: Diagnosis not present

## 2020-04-17 DIAGNOSIS — Z6832 Body mass index (BMI) 32.0-32.9, adult: Secondary | ICD-10-CM

## 2020-04-17 DIAGNOSIS — I6990 Unspecified sequelae of unspecified cerebrovascular disease: Secondary | ICD-10-CM

## 2020-04-17 DIAGNOSIS — Z6833 Body mass index (BMI) 33.0-33.9, adult: Secondary | ICD-10-CM | POA: Insufficient documentation

## 2020-04-17 HISTORY — DX: Body mass index (BMI) 32.0-32.9, adult: Z68.32

## 2020-04-17 NOTE — Progress Notes (Signed)
Subjective:  Patient ID: [name], male    DOB: [birth year]  Age: 85 y.o. MRN: [id number]  Chief Complaint  Patient presents with  . Hypertension    30M follow up    HPI: Chronic visit  Patient presents for follow up of hypertension.  Patient tolerating amlodipine,  well with side effects.  Patient was diagnosed with hypertension 2010 so has been treated for hypertension for 10 years.Patient is working on maintaining diet and exercise regimen and follows up as directed. Complication include none  Patient presents with HFrEF  that is stable. Diagnosis made 4.  The course of the disease is stable.  Current medicines include eliquis, imdur, ranexa. Patient follows a low cholesterol diet and maintains a weight diary.  Patient is on low salt, low cholesterol diet and avoids alcohol.  Patient denies adverse effects of medicines. Patient is monitoring weight and has no weight hange of weight.  Patient is having none pedal edema, no PND and no PND.  Patient is continuing to see cardiology.  .   Current Outpatient Medications on File Prior to Visit  Medication Sig Dispense Refill  . albuterol (PROVENTIL HFA;VENTOLIN HFA) 108 (90 Base) MCG/ACT inhaler Inhale 2 puffs into the lungs 4 (four) times daily as needed for wheezing or shortness of breath.    Marland Kitchen amLODipine (NORVASC) 5 MG tablet TAKE 1 TABLET(5 MG) BY MOUTH AT BEDTIME 90 tablet 1  . apixaban (ELIQUIS) 5 MG TABS tablet Take 1 tablet (5 mg total) by mouth 2 (two) times daily. 60 tablet 6  . atorvastatin (LIPITOR) 40 MG tablet TAKE 1 TABLET BY MOUTH DAILY 90 tablet 2  . Calcium Carbonate-Vitamin D (CALCIUM 500 + D PO) Take 1 tablet by mouth daily.    . FEROSUL 325 (65 Fe) MG tablet Take 325 mg by mouth daily.     Marland Kitchen FLOVENT HFA 110 MCG/ACT inhaler INHALE 2 PUFFS BY MOUTH TWICE DAILY 12 g 6  . HYDROcodone-acetaminophen (NORCO/VICODIN) 5-325 MG tablet Take 1 tablet by mouth 2 (two) times daily as needed (pain). 60 tablet 0  . isosorbide  mononitrate (IMDUR) 60 MG 24 hr tablet TAKE 1 TABLET(60 MG) BY MOUTH DAILY 60 tablet 3  . levothyroxine (SYNTHROID) 50 MCG tablet TAKE 1 TABLET BY MOUTH DAILY 90 tablet 2  . Multiple Vitamin (MULTIVITAMIN WITH MINERALS) TABS tablet Take 1 tablet by mouth daily.    . nitroGLYCERIN (NITROSTAT) 0.4 MG SL tablet DISSOLVE 1 TABLET UNDER THE TONGUE EVERY 5 MINUTES AS NEEDED FOR CHEST PAIN. MAX 3 DOSES IN 15 MINUTES 25 tablet 3  . omeprazole (PRILOSEC) 20 MG capsule Take 20 mg by mouth daily.    . ranolazine (RANEXA) 500 MG 12 hr tablet TAKE 1 TABLET(500 MG) BY MOUTH TWICE DAILY 180 tablet 2  . vitamin B-12 (CYANOCOBALAMIN) 1000 MCG tablet Take 1,000 mcg by mouth daily.      No current facility-administered medications on file prior to visit.   Past Medical History:  Diagnosis Date  . Arthritis   . Asthma   . Chronic anemia   . CKD (chronic kidney disease), stage III ([place])   . Coronary artery disease    a. moderate nonobstructive CAD by cath in 2018.  Marland Kitchen GERD (gastroesophageal reflux disease)   . Hypothyroidism   . PAF (paroxysmal atrial fibrillation) ([place])   . Peripheral vascular disease [facility])    Past Surgical History:  Procedure Laterality Date  . BIOPSY  04/11/2019   Procedure: BIOPSY;  Surgeon: Owens Loffler  P, MD;  Location: [place];  Service: Endoscopy;;  . CATARACT EXTRACTION    . COLONOSCOPY WITH PROPOFOL N/A 04/11/2019   Procedure: COLONOSCOPY WITH PROPOFOL;  Surgeon: Milus Banister, MD;  Location[facility] ENDOSCOPY;  Service: Endoscopy;  Laterality: N/A;  . ESOPHAGOGASTRODUODENOSCOPY (EGD) WITH PROPOFOL N/A 04/11/2019   Procedure: ESOPHAGOGASTRODUODENOSCOPY (EGD) WITH PROPOFOL;  Surgeon: Milus Banister, MD;  Location: [facility] ENDOSCOPY;  Service: Endoscopy;  Laterality: N/A;  . HIP SURGERY    . INSERT / REPLACE / REMOVE PACEMAKER     [employer]  . JOINT REPLACEMENT Right 10/20/1994   Hip  (Left Hip in 10/96)  . LEFT HEART CATH AND CORONARY ANGIOGRAPHY N/A 02/08/2017   Procedure:  LEFT HEART CATH AND CORONARY ANGIOGRAPHY;  Surgeon: Martinique, Peter M, MD;  Location: [place] CV LAB;  Service: Cardiovascular;  Laterality: N/A;  . ORIF PERIPROSTHETIC FRACTURE Right 08/17/2017   Procedure: OPEN REDUCTION INTERNAL FIXATION (ORIF) PERIPROSTHETIC FRACTURE RIGHT FEMUR;  Surgeon: Paralee Cancel, MD;  Location: [facility] ORS;  Service: Orthopedics;  Laterality: Right;  . ORIF PERIPROSTHETIC FRACTURE Right 01/23/2018   Procedure: Redo open reduction internal fixation right periprosthetic femur fracture;  Surgeon: Paralee Cancel, MD;  Location: [facility] ORS;  Service: Orthopedics;  Laterality: Right;  120 mins  . POLYPECTOMY  04/11/2019   Procedure: POLYPECTOMY;  Surgeon: Milus Banister, MD;  Location: [facility] ENDOSCOPY;  Service: Endoscopy;;    Family History  Problem Relation Age of Onset  . Heart attack Mother    Social History   Socioeconomic History  . Marital status: Married    Spouse name: Not on file  . Number of children: 2  . Years of education: Not on file  . Highest education level: 6th grade  Occupational History  . Occupation: Retired  Tobacco Use  . Smoking status: Never Smoker  . Smokeless tobacco: Current User    Types: Chew  . Tobacco comment: 1 can per day  Vaping Use  . Vaping Use: Never used  Substance and Sexual Activity  . Alcohol use: Yes    Alcohol/week: 3.0 standard drinks    Types: 3 Cans of beer per week    Comment: regular use 4 times a week  . Drug use: No  . Sexual activity: Not Currently    Partners: Female    Birth control/protection: None  Other Topics Concern  . Not on file  Social History Narrative  . Not on file   Social Determinants of Health   Financial Resource Strain:   . Difficulty of Paying Living Expenses: Not on file  Food Insecurity:   . Worried About Charity fundraiser in the Last Year: Not on file  . Ran Out of Food in the Last Year: Not on file  Transportation Needs: No Transportation Needs  . Lack of Transportation  (Medical): No  . Lack of Transportation (Non-Medical): No  Physical Activity:   . Days of Exercise per Week: Not on file  . Minutes of Exercise per Session: Not on file  Stress:   . Feeling of Stress : Not on file  Social Connections:   . Frequency of Communication with Friends and Family: Not on file  . Frequency of Social Gatherings with Friends and Family: Not on file  . Attends Religious Services: Not on file  . Active Member of Clubs or Organizations: Not on file  . Attends Archivist Meetings: Not on file  . Marital Status: Not on file    Review of  Systems  Constitutional: Negative.   HENT: Negative.   Eyes: Negative.        Glasses  Respiratory: Negative.   Cardiovascular: Negative.   Gastrointestinal: Negative.   Genitourinary: Negative.   Musculoskeletal: Positive for arthralgias.  Skin: Negative.   Neurological: Negative.   Psychiatric/Behavioral: Negative.      Objective:  BP 130/76 (BP Location: Left Arm, Patient Position: Sitting, Cuff Size: Normal)   Pulse 87   Temp 97.8 F (36.6 C)   Ht 5\' 4"  (1.626 m)   Wt 191 lb (86.6 kg)   SpO2 99%   BMI 32.79 kg/m   BP/Weight 04/17/2020 01/22/2020 9/98/3382  Systolic BP 505 397 673  Diastolic BP 76 78 78  Wt. (Lbs) 191 189.8 185  BMI 32.79 33.21 32.37    Physical Exam Vitals reviewed.  Constitutional:      Appearance: Normal appearance.  HENT:     Head: Normocephalic and atraumatic.     Right Ear: Tympanic membrane, ear canal and external ear normal.     Left Ear: Tympanic membrane, ear canal and external ear normal.     Nose: Nose normal.     Mouth/Throat:     Mouth: Mucous membranes are moist.     Pharynx: Oropharynx is clear.  Eyes:     Extraocular Movements: Extraocular movements intact.     Conjunctiva/sclera: Conjunctivae normal.     Pupils: Pupils are equal, round, and reactive to light.  Cardiovascular:     Rate and Rhythm: Normal rate and regular rhythm.     Pulses: Normal pulses.      Heart sounds: Normal heart sounds.  Pulmonary:     Effort: Pulmonary effort is normal.     Breath sounds: Normal breath sounds.  Abdominal:     General: Abdomen is flat.     Palpations: Abdomen is soft.  Musculoskeletal:     Cervical back: Normal range of motion and neck supple.     Comments: Slow get up and go  Skin:    General: Skin is warm and dry.     Capillary Refill: Capillary refill takes less than 2 seconds.  Neurological:     Mental Status: He is alert and oriented to person, place, and time.     Motor: Weakness (generalized) present.     Comments: Uses cane to walk       Lab Results  Component Value Date   WBC 5.3 12/14/2019   HGB 12.6 (L) 12/14/2019   HCT 38.0 12/14/2019   PLT 212 12/14/2019   GLUCOSE 95 12/14/2019   CHOL 155 12/14/2019   TRIG 117 12/14/2019   HDL 52 12/14/2019   LDLCALC 82 12/14/2019   ALT 13 12/14/2019   AST 19 12/14/2019   NA 139 12/14/2019   K 4.7 12/14/2019   CL 103 12/14/2019   CREATININE 1.34 (H) 12/14/2019   BUN 15 12/14/2019   CO2 24 12/14/2019   TSH 2.580 08/17/2019   INR 1.41 08/15/2017   HGBA1C  07/07/2008    5.8 (NOTE)   The ADA recommends the following therapeutic goal for glycemic   control related to Hgb A1C measurement:   Goal of Therapy:   < 7.0% Hgb A1C   Reference: American Diabetes Association: Clinical Practice   Recommendations 2008, Diabetes Care,  2008, 31:(Suppl 1).      Assessment & Plan:   1. Acute on chronic combined systolic and diastolic CHF (congestive heart failure) (HCC) - Brain natriuretic peptide An individualized care plan  was established and reinforced.  The patient's disease status was assessed using clinical finding son exam today, labs, and/or other diagnostic testing such as x-rays, to determine the patient's success in meeting treatmentgoalsbased on disease-based guidelines and found to beimproving. But not at goal yet. Medications prescriptions no change Laboratory tests ordered to be  performed today include routine. RECOMMENDATIONS: given include see cardiology.  Call physician is patient gains 3 lbs in one day or 5 lbs for one week.  Call for progressive PND, orthopnea or increased pedal edema.  2. Atherosclerosis of native coronary artery of native heart without angina pectoris - CBC with Differential/Platelet Patient's CAD was assessed using history and physical along with other information to maximize treatment.  Evidence based criteria was use in deciding proper management for this disease process.  Patient's CAD is under good control.therapy continue present therapy. 3. Essential hypertension, benign - Comprehensive metabolic panel  4. Paroxysmal atrial fibrillation [facility]) Patient has a diagnosis of paroxysmal atrial fibrillation.   Patient is on eliquis and has controlled ventricular response.  Patient is CV stable.  5. Gastritis and gastroduodenitis Plan of care was formulated today.  he is doing well.  A plan of care was formulated using patient exam, tests and other sources to optimize care using evidence based information.  Recommend no smoking, no eating after supper, avoid fatty foods, elevate Head of bed, avoid tight fitting clothing.  Continue on omeprazole.  6. Hypothyroidism, unspecified type - TSH Patient is known to have hypothryoidism and is n treatment with levothyroxine 50 mcg.  Patient was diagnosed 10 years ago.  Other treatment includes none.  Patient is compliant with medicines and last TSH 6 months ago.  Last TSH was normal.  7. Mixed hyperlipidemia - Lipid panel AN INDIVIDUAL CARE PLAN for hyperlipidemia/ cholesterol was established and reinforced today.  The patient's status was assessed using clinical findings on exam, lab and other diagnostic tests. The patient's disease status was assessed based on evidence-based guidelines and found to be well controlled. MEDICATIONS were reviewed. SELF MANAGEMENT GOALS have been discussed and patient's  success at attaining the goal of low cholesterol was assessed. RECOMMENDATION given include regular exercise 3 days a week and low cholesterol/low fat diet. CLINICAL SUMMARY including written plan to identify barriers unique to the patient due to social or economic  reasons was discussed.  8. BMI 32.0-32.9,adult An individualize plan was formulated for obesity using patient history and physical exam to encourage weight loss.  An evidence based program was formulated.  Patient is to cut portion size with meals and to plan physical exercise 3 days a week at least 20 minutes.  Weight watchers and other programs are helpful.  Planned amount of weight loss 10 lbs.  9. Late effects of cerebrovascular disease Patient still has diffuse weakness from stroke .      Orders Placed This Encounter  Procedures  . CBC with Differential/Platelet  . Comprehensive metabolic panel  . Lipid panel  . TSH  . Brain natriuretic peptide     Follow-up: Return in about 3 months (around 07/18/2020) for fasting.  An After Visit Summary was printed and given to the patient.  [place] [phone]

## 2020-04-18 LAB — CBC WITH DIFFERENTIAL/PLATELET
Basophils Absolute: 0 10*3/uL (ref 0.0–0.2)
Basos: 1 %
EOS (ABSOLUTE): 0.4 10*3/uL (ref 0.0–0.4)
Eos: 7 %
Hematocrit: 37.4 % — ABNORMAL LOW (ref 37.5–51.0)
Hemoglobin: 12.3 g/dL — ABNORMAL LOW (ref 13.0–17.7)
Immature Grans (Abs): 0 10*3/uL (ref 0.0–0.1)
Immature Granulocytes: 0 %
Lymphocytes Absolute: 1.2 10*3/uL (ref 0.7–3.1)
Lymphs: 24 %
MCH: 30.4 pg (ref 26.6–33.0)
MCHC: 32.9 g/dL (ref 31.5–35.7)
MCV: 93 fL (ref 79–97)
Monocytes Absolute: 0.7 10*3/uL (ref 0.1–0.9)
Monocytes: 13 %
Neutrophils Absolute: 2.8 10*3/uL (ref 1.4–7.0)
Neutrophils: 55 %
Platelets: 231 10*3/uL (ref 150–450)
RBC: 4.04 x10E6/uL — ABNORMAL LOW (ref 4.14–5.80)
RDW: 11.9 % (ref 11.6–15.4)
WBC: 5.1 10*3/uL (ref 3.4–10.8)

## 2020-04-18 LAB — COMPREHENSIVE METABOLIC PANEL
ALT: 13 IU/L (ref 0–44)
AST: 22 IU/L (ref 0–40)
Albumin/Globulin Ratio: 1.6 (ref 1.2–2.2)
Albumin: 4.1 g/dL (ref 3.6–4.6)
Alkaline Phosphatase: 79 IU/L (ref 44–121)
BUN/Creatinine Ratio: 9 — ABNORMAL LOW (ref 10–24)
BUN: 12 mg/dL (ref 8–27)
Bilirubin Total: 0.4 mg/dL (ref 0.0–1.2)
CO2: 24 mmol/L (ref 20–29)
Calcium: 9.3 mg/dL (ref 8.6–10.2)
Chloride: 104 mmol/L (ref 96–106)
Creatinine, Ser: 1.28 mg/dL — ABNORMAL HIGH (ref 0.76–1.27)
GFR calc Af Amer: 59 mL/min/{1.73_m2} — ABNORMAL LOW (ref >59)
GFR calc non Af Amer: 51 mL/min/{1.73_m2} — ABNORMAL LOW (ref >59)
Globulin, Total: 2.5 g/dL (ref 1.5–4.5)
Glucose: 90 mg/dL (ref 65–99)
Potassium: 4.4 mmol/L (ref 3.5–5.2)
Sodium: 141 mmol/L (ref 134–144)
Total Protein: 6.6 g/dL (ref 6.0–8.5)

## 2020-04-18 LAB — LIPID PANEL
Chol/HDL Ratio: 3 ratio (ref 0.0–5.0)
Cholesterol, Total: 149 mg/dL (ref 100–199)
HDL: 49 mg/dL (ref >39)
LDL Chol Calc (NIH): 82 mg/dL (ref 0–99)
Triglycerides: 98 mg/dL (ref 0–149)
VLDL Cholesterol Cal: 18 mg/dL (ref 5–40)

## 2020-04-18 LAB — CARDIOVASCULAR RISK ASSESSMENT

## 2020-04-18 LAB — TSH: TSH: 3.92 u[IU]/mL (ref 0.450–4.500)

## 2020-04-18 LAB — BRAIN NATRIURETIC PEPTIDE: BNP: 126.5 pg/mL — ABNORMAL HIGH (ref 0.0–100.0)

## 2020-04-20 NOTE — Progress Notes (Signed)
Anemia of chronic disease, kidney tests stable at stage 3, liver tests normal Cholesterol normal, TSH 3.9 normal, BNP is high, mild heart failure is he to see cardiology?,  lp

## 2020-04-21 ENCOUNTER — Other Ambulatory Visit: Payer: Self-pay

## 2020-04-21 MED ORDER — HYDROCODONE-ACETAMINOPHEN 5-325 MG PO TABS: 1.0000 | ORAL_TABLET | Freq: Two times a day (BID) | ORAL | 0 refills | Status: DC | PRN

## 2020-04-29 ENCOUNTER — Other Ambulatory Visit: Payer: Self-pay | Admitting: Legal Medicine

## 2020-05-19 ENCOUNTER — Other Ambulatory Visit: Payer: Self-pay

## 2020-05-19 ENCOUNTER — Ambulatory Visit (INDEPENDENT_AMBULATORY_CARE_PROVIDER_SITE_OTHER): Payer: PPO

## 2020-05-19 ENCOUNTER — Other Ambulatory Visit: Payer: Self-pay | Admitting: Cardiology

## 2020-05-19 DIAGNOSIS — I442 Atrioventricular block, complete: Secondary | ICD-10-CM | POA: Diagnosis not present

## 2020-05-19 MED ORDER — HYDROCODONE-ACETAMINOPHEN 5-325 MG PO TABS: 1.0000 | ORAL_TABLET | Freq: Two times a day (BID) | ORAL | 0 refills | Status: DC | PRN

## 2020-05-19 NOTE — Telephone Encounter (Signed)
Rx refill sent to pharmacy. 

## 2020-05-21 LAB — CUP PACEART REMOTE DEVICE CHECK
Battery Impedance: 1035 Ohm
Battery Remaining Longevity: 43 mo
Battery Voltage: 2.77 V
Brady Statistic AP VP Percent: 18 %
Brady Statistic AP VS Percent: 0 %
Brady Statistic AS VP Percent: 82 %
Brady Statistic AS VS Percent: 0 %
Date Time Interrogation Session: 20211129112059
Implantable Lead Implant Date: 20150319
Implantable Lead Implant Date: 20150319
Implantable Lead Location: 753859
Implantable Lead Location: 753860
Implantable Lead Model: 4092
Implantable Lead Model: 5076
Implantable Pulse Generator Implant Date: 20150319
Lead Channel Impedance Value: 402 Ohm
Lead Channel Impedance Value: 545 Ohm
Lead Channel Pacing Threshold Amplitude: 0.875 V
Lead Channel Pacing Threshold Amplitude: 1.875 V
Lead Channel Pacing Threshold Pulse Width: 0.4 ms
Lead Channel Pacing Threshold Pulse Width: 0.4 ms
Lead Channel Setting Pacing Amplitude: 1.75 V
Lead Channel Setting Pacing Amplitude: 3.75 V
Lead Channel Setting Pacing Pulse Width: 0.4 ms
Lead Channel Setting Sensing Sensitivity: 2 mV

## 2020-05-23 NOTE — Progress Notes (Signed)
Remote pacemaker transmission.   

## 2020-06-09 ENCOUNTER — Other Ambulatory Visit: Payer: Self-pay | Admitting: Legal Medicine

## 2020-06-18 ENCOUNTER — Other Ambulatory Visit: Payer: Self-pay

## 2020-06-18 MED ORDER — HYDROCODONE-ACETAMINOPHEN 5-325 MG PO TABS: 1.0000 | ORAL_TABLET | Freq: Two times a day (BID) | ORAL | 0 refills | Status: DC | PRN

## 2020-06-25 ENCOUNTER — Encounter: Payer: Self-pay | Admitting: Legal Medicine

## 2020-06-25 ENCOUNTER — Other Ambulatory Visit: Payer: Self-pay

## 2020-06-25 ENCOUNTER — Ambulatory Visit (INDEPENDENT_AMBULATORY_CARE_PROVIDER_SITE_OTHER): Payer: PPO | Admitting: Legal Medicine

## 2020-06-25 VITALS — BP 148/70 | HR 86 | Temp 97.4°F | Resp 17 | Ht 64.0 in | Wt 188.2 lb

## 2020-06-25 DIAGNOSIS — Z0000 Encounter for general adult medical examination without abnormal findings: Secondary | ICD-10-CM | POA: Diagnosis not present

## 2020-06-25 DIAGNOSIS — I5043 Acute on chronic combined systolic (congestive) and diastolic (congestive) heart failure: Secondary | ICD-10-CM | POA: Diagnosis not present

## 2020-06-25 NOTE — Patient Instructions (Signed)
  [name] , Thank you for taking time to come for your Medicare Wellness Visit. I appreciate your ongoing commitment to your health goals. Please review the following plan we discussed and let me know if I can assist you in the future.   These are the goals we discussed: Goals    . DIET - EAT MORE FRUITS AND VEGETABLES    . Pharmacy Care Plan     CARE PLAN ENTRY (see longitudinal plan of care for additional care plan information)  Current Barriers:  . Chronic Disease Management support, education, and care coordination needs related to Hypertension, Hyperlipidemia, Atrial Fibrillation, and COPD   Hypertension BP Readings from Last 3 Encounters:  01/22/20 130/78  12/14/19 130/78  08/17/19 (!) 146/80   . Pharmacist Clinical Goal(s): o Over the next 90 days, patient will work with PharmD and providers to maintain BP goal <140/90 . Current regimen:  o Amlodipine 5 mg daily . Interventions: o Reviewed home blood pressure readings.  . Patient self care activities - Over the next 90 days, patient will: o Check BP monthly, document, and provide at future appointments o Ensure daily salt intake < 2300 mg/day  Hyperlipidemia Lab Results  Component Value Date/Time   LDLCALC 82 12/14/2019 10:09 AM   . Pharmacist Clinical Goal(s): o Over the next 90 days, patient will work with PharmD and providers to maintain LDL goal < 100 . Current regimen:  o Atorvastatin 40 mg daily . Interventions: o Reviewed updated cholesterol panel.  . Patient self care activities - Over the next 90 days, patient will: o Continue taking medication as prescribed.  Afib . Pharmacist Clinical Goal(s): o Over the next 90 days, patient will work with PharmD and providers to manage atrial fibrillation risks. . Current regimen:  o Eliquis 5 mg bid . Interventions: o Patient assistance approved for 2021.  o Pharmacist will assist patient in coordinating updated application for 2022.  Marland Kitchen Patient self care  activities - Over the next 90 days, patient will: o Continue taking medication as prescribed.   Medication management . Pharmacist Clinical Goal(s): o Over the next 90 days, patient will work with PharmD and providers to maintain optimal medication adherence . Current pharmacy: [facility] . Interventions o Comprehensive medication review performed. o Continue current medication management strategy . Patient self care activities - Over the next 90 days, patient will: o Focus on medication adherence by using pill box.  o Take medications as prescribed o Report any questions or concerns to PharmD and/or provider(s)  Please see past updates related to this goal by clicking on the "Past Updates" button in the selected goal         This is a list of the screening recommended for you and due dates:  Health Maintenance  Topic Date Due  . Tetanus Vaccine  Never done  . Pneumonia vaccines (1 of 2 - PCV13) Never done  . COVID-19 Vaccine (2 - Booster for [employer] series) 11/22/2019  . Flu Shot  09/18/2020*  *Topic was postponed. The date shown is not the original due date.

## 2020-06-25 NOTE — Progress Notes (Signed)
Subjective:  Patient ID: [name], male    DOB: [birth year]  Age: 86 y.o. MRN: [id number]  Chief Complaint  Patient presents with  . Annual Exam    AWV    HPI Encounter for general adult medical examination without abnormal findings  Physical ("At Risk" items are starred): Patient's last physical exam was 1 year ago .   Growth percentile SmartLinks can only be used for patients less than 64 years old.   SDOH Screenings   Alcohol Screen: Low Risk   . Last Alcohol Screening Score (AUDIT): 0  Depression (PHQ2-9): Low Risk   . PHQ-2 Score: 1  Financial Resource Strain: Low Risk   . Difficulty of Paying Living Expenses: Not hard at all  Food Insecurity: No Food Insecurity  . Worried About Charity fundraiser in the Last Year: Never true  . Ran Out of Food in the Last Year: Never true  Housing: Low Risk   . Last Housing Risk Score: 0  Physical Activity: Sufficiently Active  . Days of Exercise per Week: 4 days  . Minutes of Exercise per Session: 60 min  Social Connections: Moderately Isolated  . Frequency of Communication with Friends and Family: Never  . Frequency of Social Gatherings with Friends and Family: Never  . Attends Religious Services: 1 to 4 times per year  . Active Member of Clubs or Organizations: No  . Attends Archivist Meetings: Never  . Marital Status: Married  Stress: No Stress Concern Present  . Feeling of Stress : Only a little  Tobacco Use: High Risk  . Smoking Tobacco Use: Never Smoker  . Smokeless Tobacco Use: Current User  Transportation Needs: No Transportation Needs  . Lack of Transportation (Medical): No  . Lack of Transportation (Non-Medical): No    Fall Risk  06/25/2020 01/15/2020  Falls in the past year? 0 1  Comment - Emmi Telephone Survey: data to providers prior to load  Number falls in past yr: 0 1  Comment - Emmi Telephone Survey Actual Response = 1  Injury with Fall? 0 0  Risk for fall due to : Impaired balance/gait -     Depression screen [facility] 2/9 06/25/2020 08/17/2019  Decreased Interest 1 0  Down, Depressed, Hopeless 0 0  PHQ - 2 Score 1 0    Functional Status Survey: Is the patient deaf or have difficulty hearing?: Yes Does the patient have difficulty seeing, even when wearing glasses/contacts?: No Does the patient have difficulty concentrating, remembering, or making decisions?: No Does the patient have difficulty walking or climbing stairs?: Yes Does the patient have difficulty dressing or bathing?: No Does the patient have difficulty doing errands alone such as visiting a doctor's office or shopping?: Yes   Safety: reviewed ;  Patient wears a seat belt. Patient's home has smoke detectors and carbon monoxide detectors. Patient practices appropriate gun safety Patient wears sunscreen with extended sun exposure. Dental Care: biannual cleanings, brushes and flosses daily. Ophthalmology/Optometry: Annual visit.  Hearing loss: none Vision impairments: none Patient is not afflicted from Stress Incontinence and Urge Incontinence   Current Outpatient Medications on File Prior to Visit  Medication Sig Dispense Refill  . albuterol (VENTOLIN HFA) 108 (90 Base) MCG/ACT inhaler INHALE 2 PUFFS BY MOUTH EVERY 6 HOURS AS NEEDED 8.5 g 6  . amLODipine (NORVASC) 5 MG tablet TAKE 1 TABLET(5 MG) BY MOUTH AT BEDTIME 90 tablet 1  . apixaban (ELIQUIS) 5 MG TABS tablet Take 1 tablet (5 mg  total) by mouth 2 (two) times daily. 60 tablet 6  . atorvastatin (LIPITOR) 40 MG tablet TAKE 1 TABLET BY MOUTH DAILY 90 tablet 2  . Calcium Carbonate-Vitamin D (CALCIUM 500 + D PO) Take 1 tablet by mouth daily.    . FEROSUL 325 (65 Fe) MG tablet TAKE 1 TABLET(325 MG) BY MOUTH TWICE DAILY 100 tablet 2  . FLOVENT HFA 110 MCG/ACT inhaler INHALE 2 PUFFS BY MOUTH TWICE DAILY 12 g 6  . HYDROcodone-acetaminophen (NORCO/VICODIN) 5-325 MG tablet Take 1 tablet by mouth 2 (two) times daily as needed (pain). 60 tablet 0  . isosorbide mononitrate  (IMDUR) 60 MG 24 hr tablet TAKE 1 TABLET(60 MG) BY MOUTH DAILY 60 tablet 3  . levothyroxine (SYNTHROID) 50 MCG tablet TAKE 1 TABLET BY MOUTH DAILY 90 tablet 2  . Multiple Vitamin (MULTIVITAMIN WITH MINERALS) TABS tablet Take 1 tablet by mouth daily.    . nitroGLYCERIN (NITROSTAT) 0.4 MG SL tablet DISSOLVE 1 TABLET UNDER THE TONGUE EVERY 5 MINUTES AS NEEDED FOR CHEST PAIN. MAX 3 DOSES IN 15 MINUTES 25 tablet 3  . omeprazole (PRILOSEC) 20 MG capsule Take 20 mg by mouth daily.    . ranolazine (RANEXA) 500 MG 12 hr tablet TAKE 1 TABLET(500 MG) BY MOUTH TWICE DAILY 180 tablet 2  . vitamin B-12 (CYANOCOBALAMIN) 1000 MCG tablet Take 1,000 mcg by mouth daily.      No current facility-administered medications on file prior to visit.    Social Hx   Social History   Socioeconomic History  . Marital status: Married    Spouse name: Not on file  . Number of children: 2  . Years of education: Not on file  . Highest education level: 6th grade  Occupational History  . Occupation: Retired  Tobacco Use  . Smoking status: Never Smoker  . Smokeless tobacco: Current User    Types: Snuff  . Tobacco comment: 1 can per day  Vaping Use  . Vaping Use: Never used  Substance and Sexual Activity  . Alcohol use: Yes    Alcohol/week: 3.0 standard drinks    Types: 3 Cans of beer per week    Comment: regular use 4 times a week  . Drug use: No  . Sexual activity: Not Currently    Partners: Female    Birth control/protection: None  Other Topics Concern  . Not on file  Social History Narrative  . Not on file   Social Determinants of Health   Financial Resource Strain: Low Risk   . Difficulty of Paying Living Expenses: Not hard at all  Food Insecurity: No Food Insecurity  . Worried About Programme researcher, broadcasting/film/video in the Last Year: Never true  . Ran Out of Food in the Last Year: Never true  Transportation Needs: No Transportation Needs  . Lack of Transportation (Medical): No  . Lack of Transportation  (Non-Medical): No  Physical Activity: Sufficiently Active  . Days of Exercise per Week: 4 days  . Minutes of Exercise per Session: 60 min  Stress: No Stress Concern Present  . Feeling of Stress : Only a little  Social Connections: Moderately Isolated  . Frequency of Communication with Friends and Family: Never  . Frequency of Social Gatherings with Friends and Family: Never  . Attends Religious Services: 1 to 4 times per year  . Active Member of Clubs or Organizations: No  . Attends Banker Meetings: Never  . Marital Status: Married   Past Medical History:  Diagnosis  Date  . Arthritis   . Asthma   . Chronic anemia   . CKD (chronic kidney disease), stage III ([place])   . Coronary artery disease    a. moderate nonobstructive CAD by cath in 2018.  Marland Kitchen GERD (gastroesophageal reflux disease)   . Hypothyroidism   . PAF (paroxysmal atrial fibrillation) ([place])   . Peripheral vascular disease ([place])    Family History  Problem Relation Age of Onset  . Heart attack Mother     Review of Systems  Constitutional: Negative for activity change, appetite change and unexpected weight change.  HENT: Negative for rhinorrhea.   Eyes: Negative for visual disturbance.  Respiratory: Negative for cough, chest tightness and shortness of breath.   Cardiovascular: Negative for chest pain, palpitations and leg swelling.  Gastrointestinal: Negative for abdominal distention, abdominal pain and nausea.  Endocrine: Negative for polyuria.  Genitourinary: Negative for difficulty urinating, dysuria and urgency.  Musculoskeletal: Negative for arthralgias and back pain.  Skin: Negative.   Neurological: Negative for dizziness, weakness and headaches.  Psychiatric/Behavioral: Negative.      Objective:  BP (!) 148/70   Pulse 86   Temp (!) 97.4 F (36.3 C) (Temporal)   Resp 17   Ht 5\' 4"  (1.626 m)   Wt 188 lb 3.2 oz (85.4 kg)   SpO2 96%   BMI 32.30 kg/m   BP/Weight 06/25/2020 04/17/2020  [id number]  Systolic BP [id number]  Diastolic BP 70 76 78  Wt. (Lbs) 188.2 191 189.8  BMI 32.3 32.79 33.21    Physical Exam reviewed  Lab Results  Component Value Date   WBC 5.1 04/17/2020   HGB 12.3 (L) 04/17/2020   HCT 37.4 (L) 04/17/2020   PLT 231 04/17/2020   GLUCOSE 90 04/17/2020   CHOL 149 04/17/2020   TRIG 98 04/17/2020   HDL 49 04/17/2020   LDLCALC 82 04/17/2020   ALT 13 04/17/2020   AST 22 04/17/2020   NA 141 04/17/2020   K 4.4 04/17/2020   CL 104 04/17/2020   CREATININE 1.28 (H) 04/17/2020   BUN 12 04/17/2020   CO2 24 04/17/2020   TSH 3.920 04/17/2020   INR 1.41 08/15/2017   HGBA1C  07/07/2008    5.8 (NOTE)   The ADA recommends the following therapeutic goal for glycemic   control related to Hgb A1C measurement:   Goal of Therapy:   < 7.0% Hgb A1C   Reference: American Diabetes Association: Clinical Practice   Recommendations 2008, Diabetes Care,  2008, 31:(Suppl 1).      Assessment & Plan:  Diagnoses and all orders for this visit: Routine general medical examination at a health care facility AWV performed, hearing and vision checked , patient has living will These are the goals we discussed: Goals    . DIET - EAT MORE FRUITS AND VEGETABLES    . Pharmacy Care Plan     CARE PLAN ENTRY (see longitudinal plan of care for additional care plan information)  Current Barriers:  . Chronic Disease Management support, education, and care coordination needs related to Hypertension, Hyperlipidemia, Atrial Fibrillation, and COPD   Hypertension BP Readings from Last 3 Encounters:  01/22/20 130/78  12/14/19 130/78  08/17/19 (!) 146/80   . Pharmacist Clinical Goal(s): o Over the next 90 days, patient will work with PharmD and providers to maintain BP goal <140/90 . Current regimen:  o Amlodipine 5 mg daily . Interventions: o Reviewed home blood pressure readings.  . Patient self care activities - Over  the next 90 days, patient will: o Check BP monthly,  document, and provide at future appointments o Ensure daily salt intake < 2300 mg/day  Hyperlipidemia Lab Results  Component Value Date/Time   LDLCALC 82 12/14/2019 10:09 AM   . Pharmacist Clinical Goal(s): o Over the next 90 days, patient will work with PharmD and providers to maintain LDL goal < 100 . Current regimen:  o Atorvastatin 40 mg daily . Interventions: o Reviewed updated cholesterol panel.  . Patient self care activities - Over the next 90 days, patient will: o Continue taking medication as prescribed.  Afib . Pharmacist Clinical Goal(s): o Over the next 90 days, patient will work with PharmD and providers to manage atrial fibrillation risks. . Current regimen:  o Eliquis 5 mg bid . Interventions: o Patient assistance approved for 2021.  o Pharmacist will assist patient in coordinating updated application for [id number].  Marland Kitchen Patient self care activities - Over the next 90 days, patient will: o Continue taking medication as prescribed.   Medication management . Pharmacist Clinical Goal(s): o Over the next 90 days, patient will work with PharmD and providers to maintain optimal medication adherence . Current pharmacy: [facility] . Interventions o Comprehensive medication review performed. o Continue current medication management strategy . Patient self care activities - Over the next 90 days, patient will: o Focus on medication adherence by using pill box.  o Take medications as prescribed o Report any questions or concerns to PharmD and/or provider(s)  Please see past updates related to this goal by clicking on the "Past Updates" button in the selected goal          This is a list of the screening recommended for you and due dates:  Health Maintenance  Topic Date Due  . Tetanus Vaccine  Never done  . Pneumonia vaccines (1 of 2 - PCV13) Never done  . COVID-19 Vaccine (2 - Booster for [employer] series) 11/22/2019  . Flu Shot  09/18/2020*  *Topic was postponed. The  date shown is not the original due date.      AN INDIVIDUALIZED CARE PLAN: was established or reinforced today.   SELF MANAGEMENT: The patient and I together assessed ways to personally work towards obtaining the recommended goals  Support needs The patient and/or family needs were assessed and services were offered and not necessary at this time.    Follow-up: Return if symptoms worsen or fail to improve.  Reinaldo Meeker, MD [facility] [phone]

## 2020-07-03 ENCOUNTER — Telehealth: Payer: Self-pay

## 2020-07-03 NOTE — Progress Notes (Signed)
Chronic Care Management Pharmacy Assistant   Name: [name]  MRN: [id number] DOB: [birth year]  Reason for Encounter: Medication Review for PAP forms for Flovent and Eliquis    PCP : Lillard Anes, MD  Allergies:  No Known Allergies  Medications: Outpatient Encounter Medications as of 07/03/2020  Medication Sig   albuterol (VENTOLIN HFA) 108 (90 Base) MCG/ACT inhaler INHALE 2 PUFFS BY MOUTH EVERY 6 HOURS AS NEEDED   amLODipine (NORVASC) 5 MG tablet TAKE 1 TABLET(5 MG) BY MOUTH AT BEDTIME   apixaban (ELIQUIS) 5 MG TABS tablet Take 1 tablet (5 mg total) by mouth 2 (two) times daily.   atorvastatin (LIPITOR) 40 MG tablet TAKE 1 TABLET BY MOUTH DAILY   Calcium Carbonate-Vitamin D (CALCIUM 500 + D PO) Take 1 tablet by mouth daily.   FEROSUL 325 (65 Fe) MG tablet TAKE 1 TABLET(325 MG) BY MOUTH TWICE DAILY   FLOVENT HFA 110 MCG/ACT inhaler INHALE 2 PUFFS BY MOUTH TWICE DAILY   HYDROcodone-acetaminophen (NORCO/VICODIN) 5-325 MG tablet Take 1 tablet by mouth 2 (two) times daily as needed (pain).   isosorbide mononitrate (IMDUR) 60 MG 24 hr tablet TAKE 1 TABLET(60 MG) BY MOUTH DAILY   levothyroxine (SYNTHROID) 50 MCG tablet TAKE 1 TABLET BY MOUTH DAILY   Multiple Vitamin (MULTIVITAMIN WITH MINERALS) TABS tablet Take 1 tablet by mouth daily.   nitroGLYCERIN (NITROSTAT) 0.4 MG SL tablet DISSOLVE 1 TABLET UNDER THE TONGUE EVERY 5 MINUTES AS NEEDED FOR CHEST PAIN. MAX 3 DOSES IN 15 MINUTES   omeprazole (PRILOSEC) 20 MG capsule Take 20 mg by mouth daily.   ranolazine (RANEXA) 500 MG 12 hr tablet TAKE 1 TABLET(500 MG) BY MOUTH TWICE DAILY   vitamin B-12 (CYANOCOBALAMIN) 1000 MCG tablet Take 1,000 mcg by mouth daily.    No facility-administered encounter medications on file as of 07/03/2020.    Current Diagnosis: Patient Active Problem List   Diagnosis Date Noted   BMI 32.0-32.9,adult 04/17/2020   Mixed hyperlipidemia 12/14/2019   Osteoarthritis of both knees  08/17/2019   Malignant neoplasm of prostate ([place]) 08/17/2019   Pacemaker 08/08/2019   Symptomatic anemia 94/70/9628   Acute diastolic CHF (congestive heart failure) ([place]) 04/12/2019   CKD (chronic kidney disease), stage III ([place]) 04/12/2019   Iron deficiency anemia    Gastritis and gastroduodenitis    Polyp of cecum    Coronary atherosclerosis of native coronary artery 04/09/2019   Acute on chronic combined systolic and diastolic CHF (congestive heart failure) ([place]) 04/09/2019   Obesity 01/25/2018   Essential hypertension, benign 08/15/2017   Late effect of cerebrovascular accident (CVA) 05/25/2017   Late effects of cerebrovascular disease 05/25/2017   Sequela of cerebrovascular accident 05/25/2017   Exertional angina ([place]) 02/08/2017   Preinfarction syndrome ([place]) 02/08/2017   Dyslipidemia 12/29/2016   Right-sided carotid artery disease ([place]) 12/29/2016   Disorder of carotid artery ([place]) 12/29/2016   Peripheral vascular disease ([place]) 04/23/2015   Precordial pain 04/23/2015   Complete heart block ([place]) 04/01/2015   Hypothyroidism 04/01/2015   Routine general medical examination at a health care facility 04/01/2015   Paroxysmal atrial fibrillation ([place]) 04/01/2015   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   I have submitted the forms for signature and approval.  I spoke to the patient and he is going to come by the office on 07/03/2020 to sign forms and bring proof of income.  Follow-Up:  Patient Assistance Coordination and Pharmacist Review  Donette Larry, CPP  Notified  Clarita Leber, [place] Pharmacist Assistant [phone]

## 2020-07-10 ENCOUNTER — Telehealth: Payer: Self-pay

## 2020-07-10 NOTE — Progress Notes (Signed)
Chronic Care Management Pharmacy Assistant   Name: [name]  MRN: [id number] DOB: Apr 17, 1935  Reason for Encounter: Medication Review for signature on PAP  forms    PCP : Lillard Anes, MD  Allergies:  No Known Allergies  Medications: Outpatient Encounter Medications as of 07/10/2020  Medication Sig  . albuterol (VENTOLIN HFA) 108 (90 Base) MCG/ACT inhaler INHALE 2 PUFFS BY MOUTH EVERY 6 HOURS AS NEEDED  . amLODipine (NORVASC) 5 MG tablet TAKE 1 TABLET(5 MG) BY MOUTH AT BEDTIME  . apixaban (ELIQUIS) 5 MG TABS tablet Take 1 tablet (5 mg total) by mouth 2 (two) times daily.  Marland Kitchen atorvastatin (LIPITOR) 40 MG tablet TAKE 1 TABLET BY MOUTH DAILY  . Calcium Carbonate-Vitamin D (CALCIUM 500 + D PO) Take 1 tablet by mouth daily.  . FEROSUL 325 (65 Fe) MG tablet TAKE 1 TABLET(325 MG) BY MOUTH TWICE DAILY  . FLOVENT HFA 110 MCG/ACT inhaler INHALE 2 PUFFS BY MOUTH TWICE DAILY  . HYDROcodone-acetaminophen (NORCO/VICODIN) 5-325 MG tablet Take 1 tablet by mouth 2 (two) times daily as needed (pain).  . isosorbide mononitrate (IMDUR) 60 MG 24 hr tablet TAKE 1 TABLET(60 MG) BY MOUTH DAILY  . levothyroxine (SYNTHROID) 50 MCG tablet TAKE 1 TABLET BY MOUTH DAILY  . Multiple Vitamin (MULTIVITAMIN WITH MINERALS) TABS tablet Take 1 tablet by mouth daily.  . nitroGLYCERIN (NITROSTAT) 0.4 MG SL tablet DISSOLVE 1 TABLET UNDER THE TONGUE EVERY 5 MINUTES AS NEEDED FOR CHEST PAIN. MAX 3 DOSES IN 15 MINUTES  . omeprazole (PRILOSEC) 20 MG capsule Take 20 mg by mouth daily.  . ranolazine (RANEXA) 500 MG 12 hr tablet TAKE 1 TABLET(500 MG) BY MOUTH TWICE DAILY  . vitamin B-12 (CYANOCOBALAMIN) 1000 MCG tablet Take 1,000 mcg by mouth daily.    No facility-administered encounter medications on file as of 07/10/2020.    Current Diagnosis: Patient Active Problem List   Diagnosis Date Noted  . BMI 32.0-32.9,adult 04/17/2020  . Mixed hyperlipidemia 12/14/2019  . Osteoarthritis of both knees  08/17/2019  . Malignant neoplasm of prostate ([place]) 08/17/2019  . Pacemaker 08/08/2019  . Symptomatic anemia 04/12/2019  . Acute diastolic CHF (congestive heart failure) ([place]) 04/12/2019  . CKD (chronic kidney disease), stage III ([place]) 04/12/2019  . Iron deficiency anemia   . Gastritis and gastroduodenitis   . Polyp of cecum   . Coronary atherosclerosis of native coronary artery 04/09/2019  . Acute on chronic combined systolic and diastolic CHF (congestive heart failure) ([place]) 04/09/2019  . Obesity 01/25/2018  . Essential hypertension, benign 08/15/2017  . Late effect of cerebrovascular accident (CVA) 05/25/2017  . Late effects of cerebrovascular disease 05/25/2017  . Sequela of cerebrovascular accident 05/25/2017  . Exertional angina ([place]) 02/08/2017  . Preinfarction syndrome ([place]) 02/08/2017  . Dyslipidemia 12/29/2016  . Right-sided carotid artery disease ([place]) 12/29/2016  . Disorder of carotid artery ([place]) 12/29/2016  . Peripheral vascular disease ([place]) 04/23/2015  . Precordial pain 04/23/2015  . Complete heart block ([place]) 04/01/2015  . Hypothyroidism 04/01/2015  . Routine general medical examination at a health care facility 04/01/2015  . Paroxysmal atrial fibrillation ([place]) 04/01/2015  . 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   I called and spoke to patients wife to see when they would like to come back in, she wanted to come today if possible, I told her it  was not rush, but they will come today between 1:30 and 4:30.  I told her the papers would be in ste 27 for them to sign.   Follow-Up:  Pharmacist Review  Donette Larry, CPP notified  Clarita Leber, [place] Pharmacist Assistant [phone]

## 2020-07-17 ENCOUNTER — Other Ambulatory Visit: Payer: Self-pay

## 2020-07-17 MED ORDER — HYDROCODONE-ACETAMINOPHEN 5-325 MG PO TABS: 1.0000 | ORAL_TABLET | Freq: Two times a day (BID) | ORAL | 0 refills | Status: DC | PRN

## 2020-07-22 DIAGNOSIS — I480 Paroxysmal atrial fibrillation: Secondary | ICD-10-CM | POA: Insufficient documentation

## 2020-07-22 DIAGNOSIS — M1990 Unspecified osteoarthritis, unspecified site: Secondary | ICD-10-CM | POA: Insufficient documentation

## 2020-07-22 DIAGNOSIS — D649 Anemia, unspecified: Secondary | ICD-10-CM | POA: Insufficient documentation

## 2020-07-22 DIAGNOSIS — I2510 Atherosclerotic heart disease of native coronary artery without angina pectoris: Secondary | ICD-10-CM | POA: Insufficient documentation

## 2020-07-22 DIAGNOSIS — K219 Gastro-esophageal reflux disease without esophagitis: Secondary | ICD-10-CM | POA: Insufficient documentation

## 2020-07-22 DIAGNOSIS — J45909 Unspecified asthma, uncomplicated: Secondary | ICD-10-CM | POA: Insufficient documentation

## 2020-07-24 ENCOUNTER — Ambulatory Visit: Payer: PPO | Admitting: Cardiology

## 2020-07-24 ENCOUNTER — Encounter: Payer: Self-pay | Admitting: Cardiology

## 2020-07-24 ENCOUNTER — Other Ambulatory Visit: Payer: Self-pay

## 2020-07-24 VITALS — BP 156/58 | HR 78 | Ht 62.0 in | Wt 191.0 lb

## 2020-07-24 DIAGNOSIS — I480 Paroxysmal atrial fibrillation: Secondary | ICD-10-CM

## 2020-07-24 DIAGNOSIS — Z950 Presence of cardiac pacemaker: Secondary | ICD-10-CM | POA: Diagnosis not present

## 2020-07-24 DIAGNOSIS — I442 Atrioventricular block, complete: Secondary | ICD-10-CM | POA: Diagnosis not present

## 2020-07-24 DIAGNOSIS — I10 Essential (primary) hypertension: Secondary | ICD-10-CM

## 2020-07-24 NOTE — Progress Notes (Signed)
Cardiology Office Note:    Date:  07/24/2020   ID:  [name], DOB 1934/07/08, MRN [id number]  PCP:  Lillard Anes, MD  Cardiologist:  Jenne Campus, MD    Referring MD: Lillard Anes,*   Chief Complaint  Patient presents with  . Follow-up  I am doing fine  History of Present Illness:    [name] is a 86 y.o. male with past medical history significant for paroxysmal atrial fibrillation, anticoagulant with Eliquis, history of CVA, essential hypertension, complete heart block required pacemaker.  Comes today to my office for follow-up.  Overall he is doing very well he said he does not have any chest pain shortness of breath tightness squeezing pressure burning chest he sits majority of time watching TV.  Past Medical History:  Diagnosis Date  . Acute diastolic CHF (congestive heart failure) ([place]) 04/12/2019  . Acute on chronic combined systolic and diastolic CHF (congestive heart failure) ([place]) 04/09/2019  . Arthritis   . Asthma   . BMI 32.0-32.9,adult 04/17/2020  . Chronic anemia   . CKD (chronic kidney disease), stage III ([place])   . Complete atrioventricular block ([place]) 04/01/2015  . Complete heart block ([place]) 04/01/2015  . Coronary artery disease    a. moderate nonobstructive CAD by cath in 2018.  Marland Kitchen Coronary atherosclerosis of native coronary artery 04/09/2019   Mid RCA lesion, 60 %stenosed. Prox LAD lesion, 40 %stenosed. Prox LAD to Mid LAD lesion, 50 %stenosed. LPDA lesion, 60 %stenosed.  . Disorder of carotid artery ([place]) 12/29/2016  . Dyslipidemia 12/29/2016  . Essential hypertension, benign 08/15/2017  . Exertional angina ([place]) 02/08/2017  . Gastritis and gastroduodenitis   . GERD (gastroesophageal reflux disease)   . Hypothyroidism   . Iron deficiency anemia   . Late effect of cerebrovascular accident (CVA) 05/25/2017  . Late effects of cerebrovascular disease 05/25/2017  . Malignant neoplasm of prostate ([place]) 08/17/2019  . Mixed  hyperlipidemia 12/14/2019  . Obesity 01/25/2018  . Osteoarthritis of both knees 08/17/2019  . Pacemaker 08/08/2019  . PAF (paroxysmal atrial fibrillation) ([place])   . Paroxysmal atrial fibrillation ([place]) 04/01/2015  . Peripheral vascular disease ([place])   . Polyp of cecum   . Precordial pain 04/23/2015  . Preinfarction syndrome ([place]) 02/08/2017  . Right-sided carotid artery disease ([place]) 12/29/2016  . Routine general medical examination at a health care facility 04/01/2015  . Sequela of cerebrovascular accident 05/25/2017  . Symptomatic anemia 04/12/2019    Past Surgical History:  Procedure Laterality Date  . BIOPSY  04/11/2019   Procedure: BIOPSY;  Surgeon: Milus Banister, MD;  Location: [place];  Service: Endoscopy;;  . CATARACT EXTRACTION    . COLONOSCOPY WITH PROPOFOL N/A 04/11/2019   Procedure: COLONOSCOPY WITH PROPOFOL;  Surgeon: Milus Banister, MD;  Location[facility] ENDOSCOPY;  Service: Endoscopy;  Laterality: N/A;  . ESOPHAGOGASTRODUODENOSCOPY (EGD) WITH PROPOFOL N/A 04/11/2019   Procedure: ESOPHAGOGASTRODUODENOSCOPY (EGD) WITH PROPOFOL;  Surgeon: Milus Banister, MD;  Location: [facility]COPY;  Service: Endoscopy;  Laterality: N/A;  . HIP SURGERY    . INSERT / REPLACE / REMOVE PACEMAKER     Medtronic  . JOINT REPLACEMENT Right 10/20/1994   Hip  (Left Hip in 10/96)  . LEFT HEART CATH AND CORONARY ANGIOGRAPHY N/A 02/08/2017   Procedure: LEFT HEART CATH AND CORONARY ANGIOGRAPHY;  Surgeon: Martinique, Peter M, MD;  Location: [place] CV LAB;  Service: Cardiovascular;  Laterality: N/A;  . ORIF PERIPROSTHETIC FRACTURE Right 08/17/2017   Procedure: OPEN  REDUCTION INTERNAL FIXATION (ORIF) PERIPROSTHETIC FRACTURE RIGHT FEMUR;  Surgeon: Paralee Cancel, MD;  Location: [facility] ORS;  Service: Orthopedics;  Laterality: Right;  . ORIF PERIPROSTHETIC FRACTURE Right 01/23/2018   Procedure: Redo open reduction internal fixation right periprosthetic femur fracture;  Surgeon: Paralee Cancel, MD;  Location: [facility]  ORS;  Service: Orthopedics;  Laterality: Right;  120 mins  . POLYPECTOMY  04/11/2019   Procedure: POLYPECTOMY;  Surgeon: Milus Banister, MD;  Location: [facility] ENDOSCOPY;  Service: Endoscopy;;    Current Medications: Current Meds  Medication Sig  . albuterol (VENTOLIN HFA) 108 (90 Base) MCG/ACT inhaler INHALE 2 PUFFS BY MOUTH EVERY 6 HOURS AS NEEDED  . amLODipine (NORVASC) 5 MG tablet TAKE 1 TABLET(5 MG) BY MOUTH AT BEDTIME  . apixaban (ELIQUIS) 5 MG TABS tablet Take 1 tablet (5 mg total) by mouth 2 (two) times daily.  Marland Kitchen atorvastatin (LIPITOR) 40 MG tablet TAKE 1 TABLET BY MOUTH DAILY  . Calcium Carbonate-Vitamin D (CALCIUM 500 + D PO) Take 1 tablet by mouth daily.  . FEROSUL 325 (65 Fe) MG tablet TAKE 1 TABLET(325 MG) BY MOUTH TWICE DAILY  . FLOVENT HFA 110 MCG/ACT inhaler INHALE 2 PUFFS BY MOUTH TWICE DAILY  . HYDROcodone-acetaminophen (NORCO/VICODIN) 5-325 MG tablet Take 1 tablet by mouth 2 (two) times daily as needed (pain).  . isosorbide mononitrate (IMDUR) 60 MG 24 hr tablet TAKE 1 TABLET(60 MG) BY MOUTH DAILY  . levothyroxine (SYNTHROID) 50 MCG tablet TAKE 1 TABLET BY MOUTH DAILY  . Multiple Vitamin (MULTIVITAMIN WITH MINERALS) TABS tablet Take 1 tablet by mouth daily.  . nitroGLYCERIN (NITROSTAT) 0.4 MG SL tablet DISSOLVE 1 TABLET UNDER THE TONGUE EVERY 5 MINUTES AS NEEDED FOR CHEST PAIN. MAX 3 DOSES IN 15 MINUTES  . omeprazole (PRILOSEC) 20 MG capsule Take 20 mg by mouth daily.  . ranolazine (RANEXA) 500 MG 12 hr tablet TAKE 1 TABLET(500 MG) BY MOUTH TWICE DAILY  . vitamin B-12 (CYANOCOBALAMIN) 1000 MCG tablet Take 1,000 mcg by mouth daily.      Allergies:   Patient has no known allergies.   Social History   Socioeconomic History  . Marital status: Married    Spouse name: Not on file  . Number of children: 2  . Years of education: Not on file  . Highest education level: 6th grade  Occupational History  . Occupation: Retired  Tobacco Use  . Smoking status: Never Smoker  .  Smokeless tobacco: Current User    Types: Snuff  . Tobacco comment: 1 can per day  Vaping Use  . Vaping Use: Never used  Substance and Sexual Activity  . Alcohol use: Yes    Alcohol/week: 3.0 standard drinks    Types: 3 Cans of beer per week    Comment: regular use 4 times a week  . Drug use: No  . Sexual activity: Not Currently    Partners: Female    Birth control/protection: None  Other Topics Concern  . Not on file  Social History Narrative  . Not on file   Social Determinants of Health   Financial Resource Strain: Low Risk   . Difficulty of Paying Living Expenses: Not hard at all  Food Insecurity: No Food Insecurity  . Worried About Charity fundraiser in the Last Year: Never true  . Ran Out of Food in the Last Year: Never true  Transportation Needs: No Transportation Needs  . Lack of Transportation (Medical): No  . Lack of Transportation (Non-Medical): No  Physical Activity:  Sufficiently Active  . Days of Exercise per Week: 4 days  . Minutes of Exercise per Session: 60 min  Stress: No Stress Concern Present  . Feeling of Stress : Only a little  Social Connections: Moderately Isolated  . Frequency of Communication with Friends and Family: Never  . Frequency of Social Gatherings with Friends and Family: Never  . Attends Religious Services: 1 to 4 times per year  . Active Member of Clubs or Organizations: No  . Attends Archivist Meetings: Never  . Marital Status: Married     Family History: The patient's family history includes Heart attack in his mother. ROS:   Please see the history of present illness.    All 14 point review of systems negative except as described per history of present illness  EKGs/Labs/Other Studies Reviewed:      Recent Labs: 04/17/2020: ALT 13; BNP 126.5; BUN 12; Creatinine, Ser 1.28; Hemoglobin 12.3; Platelets 231; Potassium 4.4; Sodium 141; TSH 3.920  Recent Lipid Panel    Component Value Date/Time   CHOL 149 04/17/2020  0942   TRIG 98 04/17/2020 0942   HDL 49 04/17/2020 0942   CHOLHDL 3.0 04/17/2020 0942   CHOLHDL 2.2 04/10/2019 0448   VLDL 11 04/10/2019 0448   LDLCALC 82 04/17/2020 0942    Physical Exam:    VS:  BP (!) 156/58 (BP Location: Left Arm, Patient Position: Sitting)   Pulse 78   Ht 5\' 2"  (1.575 m)   Wt 191 lb (86.6 kg)   SpO2 98%   BMI 34.93 kg/m     Wt Readings from Last 3 Encounters:  07/24/20 191 lb (86.6 kg)  06/25/20 188 lb 3.2 oz (85.4 kg)  04/17/20 191 lb (86.6 kg)     GEN:  Well nourished, well developed in no acute distress HEENT: Normal NECK: No JVD; No carotid bruits LYMPHATICS: No lymphadenopathy CARDIAC: RRR, no murmurs, no rubs, no gallops RESPIRATORY:  Clear to auscultation without rales, wheezing or rhonchi  ABDOMEN: Soft, non-tender, non-distended MUSCULOSKELETAL:  No edema; No deformity  SKIN: Warm and dry LOWER EXTREMITIES: no swelling NEUROLOGIC:  Alert and oriented x 3 PSYCHIATRIC:  Normal affect   ASSESSMENT:    1. Paroxysmal atrial fibrillation (HCC)   2. Essential hypertension, benign   3. Complete atrioventricular block (HCC)   4. Pacemaker    PLAN:    In order of problems listed above:  1. Paroxysmal atrial fibrillation EKG today showed sinus rhythm VAT mode of the pacemaker.  He is anticoagulated with Eliquis which I will continue. 2. Essential hypertension blood pressure slightly elevated today but he tells me at home is always good we will continue present management. 3. Pacemaker present is a Medtronic device 3.5 years left in device, i.e. review interrogation.  He does have a very rare episode of short lasting atrial fibrillation episodes up to 20 to 30 minutes. 4. Peripheral vascular disease with completely occluded carotic artery.  Risk factors modifications. 5. Overall he seems to be doing well dyslipidemia he is fasting lipid profile high from 04/17/2020 which show LDL of 82 HDL 49 this is on Lipitor 40 which I will  continue.   Medication Adjustments/Labs and Tests Ordered: Current medicines are reviewed at length with the patient today.  Concerns regarding medicines are outlined above.  No orders of the defined types were placed in this encounter.  Medication changes: No orders of the defined types were placed in this encounter.   Signed, Park Liter, MD,  [facility] 07/24/2020 2:58 PM    [facility]

## 2020-07-24 NOTE — Addendum Note (Signed)
Addended by: Senaida Ores on: 07/24/2020 03:08 PM   Modules accepted: Orders

## 2020-07-24 NOTE — Patient Instructions (Signed)
Medication Instructions:  Your physician recommends that you continue on your current medications as directed. Please refer to the Current Medication list given to you today.  *If you need a refill on your cardiac medications before your next appointment, please call your pharmacy*   Lab Work: None. If you have labs (blood work) drawn today and your tests are completely normal, you will receive your results only by: . MyChart Message (if you have MyChart) OR . A paper copy in the mail If you have any lab test that is abnormal or we need to change your treatment, we will call you to review the results.   Testing/Procedures: Your physician has requested that you have an echocardiogram. Echocardiography is a painless test that uses sound waves to create images of your heart. It provides your doctor with information about the size and shape of your heart and how well your heart's chambers and valves are working. This procedure takes approximately one hour. There are no restrictions for this procedure.     Follow-Up: At [facility], you and your health needs are our priority.  As part of our continuing mission to provide you with exceptional heart care, we have created designated Provider Care Teams.  These Care Teams include your primary Cardiologist (physician) and Advanced Practice Providers (APPs -  Physician Assistants and Nurse Practitioners) who all work together to provide you with the care you need, when you need it.  We recommend signing up for the patient portal called "MyChart".  Sign up information is provided on this After Visit Summary.  MyChart is used to connect with patients for Virtual Visits (Telemedicine).  Patients are able to view lab/test results, encounter notes, upcoming appointments, etc.  Non-urgent messages can be sent to your provider as well.   To learn more about what you can do with MyChart, go to [email or web address].    Your next appointment:   6  month(s)  The format for your next appointment:   In Person  Provider:   Robert Krasowski, MD   Other Instructions   Echocardiogram An echocardiogram is a test that uses sound waves (ultrasound) to produce images of the heart. Images from an echocardiogram can provide important information about:  Heart size and shape.  The size and thickness and movement of your heart's walls.  Heart muscle function and strength.  Heart valve function or if you have stenosis. Stenosis is when the heart valves are too narrow.  If blood is flowing backward through the heart valves (regurgitation).  A tumor or infectious growth around the heart valves.  Areas of heart muscle that are not working well because of poor blood flow or injury from a heart attack.  Aneurysm detection. An aneurysm is a weak or damaged part of an artery wall. The wall bulges out from the normal force of blood pumping through the body. Tell a health care provider about:  Any allergies you have.  All medicines you are taking, including vitamins, herbs, eye drops, creams, and over-the-counter medicines.  Any blood disorders you have.  Any surgeries you have had.  Any medical conditions you have.  Whether you are pregnant or may be pregnant. What are the risks? Generally, this is a safe test. However, problems may occur, including an allergic reaction to dye (contrast) that may be used during the test. What happens before the test? No specific preparation is needed. You may eat and drink normally. What happens during the test?  You will take off your   clothes from the waist up and put on a hospital gown.  Electrodes or electrocardiogram (ECG)patches may be placed on your chest. The electrodes or patches are then connected to a device that monitors your heart rate and rhythm.  You will lie down on a table for an ultrasound exam. A gel will be applied to your chest to help sound waves pass through your skin.  A  handheld device, called a transducer, will be pressed against your chest and moved over your heart. The transducer produces sound waves that travel to your heart and bounce back (or "echo" back) to the transducer. These sound waves will be captured in real-time and changed into images of your heart that can be viewed on a video monitor. The images will be recorded on a computer and reviewed by your health care provider.  You may be asked to change positions or hold your breath for a short time. This makes it easier to get different views or better views of your heart.  In some cases, you may receive contrast through an IV in one of your veins. This can improve the quality of the pictures from your heart. The procedure may vary among health care providers and hospitals.   What can I expect after the test? You may return to your normal, everyday life, including diet, activities, and medicines, unless your health care provider tells you not to do that. Follow these instructions at home:  It is up to you to get the results of your test. Ask your health care provider, or the department that is doing the test, when your results will be ready.  Keep all follow-up visits. This is important. Summary  An echocardiogram is a test that uses sound waves (ultrasound) to produce images of the heart.  Images from an echocardiogram can provide important information about the size and shape of your heart, heart muscle function, heart valve function, and other possible heart problems.  You do not need to do anything to prepare before this test. You may eat and drink normally.  After the echocardiogram is completed, you may return to your normal, everyday life, unless your health care provider tells you not to do that. This information is not intended to replace advice given to you by your health care provider. Make sure you discuss any questions you have with your health care provider. Document Revised:  01/29/2020 Document Reviewed: 01/29/2020 Elsevier Patient Education  2021 [employer]   

## 2020-08-10 ENCOUNTER — Other Ambulatory Visit: Payer: Self-pay | Admitting: Cardiology

## 2020-08-11 NOTE — Telephone Encounter (Signed)
Refill sent to pharmacy.   

## 2020-08-15 ENCOUNTER — Encounter: Payer: Self-pay | Admitting: Legal Medicine

## 2020-08-15 ENCOUNTER — Ambulatory Visit (INDEPENDENT_AMBULATORY_CARE_PROVIDER_SITE_OTHER): Payer: PPO | Admitting: Legal Medicine

## 2020-08-15 ENCOUNTER — Other Ambulatory Visit: Payer: Self-pay

## 2020-08-15 VITALS — BP 150/80 | HR 92 | Temp 97.6°F | Resp 17 | Ht 62.0 in | Wt 189.0 lb

## 2020-08-15 DIAGNOSIS — I6930 Unspecified sequelae of cerebral infarction: Secondary | ICD-10-CM | POA: Diagnosis not present

## 2020-08-15 DIAGNOSIS — I10 Essential (primary) hypertension: Secondary | ICD-10-CM | POA: Diagnosis not present

## 2020-08-15 DIAGNOSIS — K2100 Gastro-esophageal reflux disease with esophagitis, without bleeding: Secondary | ICD-10-CM

## 2020-08-15 DIAGNOSIS — E782 Mixed hyperlipidemia: Secondary | ICD-10-CM

## 2020-08-15 DIAGNOSIS — I739 Peripheral vascular disease, unspecified: Secondary | ICD-10-CM

## 2020-08-15 DIAGNOSIS — Z8546 Personal history of malignant neoplasm of prostate: Secondary | ICD-10-CM | POA: Diagnosis not present

## 2020-08-15 DIAGNOSIS — I480 Paroxysmal atrial fibrillation: Secondary | ICD-10-CM

## 2020-08-15 DIAGNOSIS — Z950 Presence of cardiac pacemaker: Secondary | ICD-10-CM | POA: Diagnosis not present

## 2020-08-15 DIAGNOSIS — I2510 Atherosclerotic heart disease of native coronary artery without angina pectoris: Secondary | ICD-10-CM | POA: Diagnosis not present

## 2020-08-15 DIAGNOSIS — E039 Hypothyroidism, unspecified: Secondary | ICD-10-CM

## 2020-08-15 DIAGNOSIS — I2089 Other forms of angina pectoris: Secondary | ICD-10-CM

## 2020-08-15 DIAGNOSIS — I208 Other forms of angina pectoris: Secondary | ICD-10-CM

## 2020-08-15 MED ORDER — HYDROCODONE-ACETAMINOPHEN 5-325 MG PO TABS
1.0000 | ORAL_TABLET | Freq: Two times a day (BID) | ORAL | 0 refills | Status: DC | PRN
Start: 2020-08-15 — End: 2020-09-11

## 2020-08-15 NOTE — Progress Notes (Signed)
Subjective:  Patient ID: [name], male    DOB: 06/23/1934  Age: 86 y.o. MRN: [id number]  Chief Complaint  Patient presents with  . Hypertension  . Hyperlipidemia  . Atrial Fibrillation    HPI: chronic visit  Patient presents for follow up of hypertension.  Patient tolerating amlodipine well with side effects.  Patient was diagnosed with hypertension 2010 so has been treated for hypertension for 10 years.Patient is working on maintaining diet and exercise regimen and follows up as directed. Complication include CAD.  Patient presents with hyperlipidemia.  Compliance with treatment has been good; patient takes medicines as directed, maintains low cholesterol diet, follows up as directed, and maintains exercise regimen.  Patient is using atorvastatin without problems.  Patient has a diagnosis of paroxysmal atrial fibrillation.   Patient is on eliquis and has controlled ventricular response.  Patient is CV stable.   Current Outpatient Medications on File Prior to Visit  Medication Sig Dispense Refill  . albuterol (VENTOLIN HFA) 108 (90 Base) MCG/ACT inhaler INHALE 2 PUFFS BY MOUTH EVERY 6 HOURS AS NEEDED 8.5 g 6  . amLODipine (NORVASC) 5 MG tablet TAKE 1 TABLET(5 MG) BY MOUTH AT BEDTIME 90 tablet 1  . apixaban (ELIQUIS) 5 MG TABS tablet Take 1 tablet (5 mg total) by mouth 2 (two) times daily. 60 tablet 6  . atorvastatin (LIPITOR) 40 MG tablet TAKE 1 TABLET BY MOUTH DAILY 90 tablet 2  . Calcium Carbonate-Vitamin D (CALCIUM 500 + D PO) Take 1 tablet by mouth daily.    . FEROSUL 325 (65 Fe) MG tablet TAKE 1 TABLET(325 MG) BY MOUTH TWICE DAILY 100 tablet 2  . FLOVENT HFA 110 MCG/ACT inhaler INHALE 2 PUFFS BY MOUTH TWICE DAILY 12 g 6  . isosorbide mononitrate (IMDUR) 60 MG 24 hr tablet TAKE 1 TABLET(60 MG) BY MOUTH DAILY 60 tablet 3  . levothyroxine (SYNTHROID) 50 MCG tablet TAKE 1 TABLET BY MOUTH DAILY 90 tablet 2  . Multiple Vitamin (MULTIVITAMIN WITH MINERALS) TABS tablet Take 1  tablet by mouth daily.    . nitroGLYCERIN (NITROSTAT) 0.4 MG SL tablet DISSOLVE 1 TABLET UNDER THE TONGUE EVERY 5 MINUTES AS NEEDED FOR CHEST PAIN. MAXIMUM OF 3 DOSES IN 15 MINUTES 25 tablet 3  . omeprazole (PRILOSEC) 20 MG capsule Take 20 mg by mouth daily.    . ranolazine (RANEXA) 500 MG 12 hr tablet TAKE 1 TABLET(500 MG) BY MOUTH TWICE DAILY 180 tablet 2  . vitamin B-12 (CYANOCOBALAMIN) 1000 MCG tablet Take 1,000 mcg by mouth daily.      No current facility-administered medications on file prior to visit.   Past Medical History:  Diagnosis Date  . Acute diastolic CHF (congestive heart failure) ([place]) 04/12/2019  . Acute on chronic combined systolic and diastolic CHF (congestive heart failure) ([place]) 04/09/2019  . Arthritis   . Asthma   . BMI 32.0-32.9,adult 04/17/2020  . Chronic anemia   . CKD (chronic kidney disease), stage III ([place])   . Complete atrioventricular block ([place]) 04/01/2015  . Complete heart block ([place]) 04/01/2015  . Coronary artery disease    a. moderate nonobstructive CAD by cath in 2018.  Marland Kitchen Coronary atherosclerosis of native coronary artery 04/09/2019   Mid RCA lesion, 60 %stenosed. Prox LAD lesion, 40 %stenosed. Prox LAD to Mid LAD lesion, 50 %stenosed. LPDA lesion, 60 %stenosed.  . Disorder of carotid artery ([place]) 12/29/2016  . Dyslipidemia 12/29/2016  . Essential hypertension, benign 08/15/2017  . Exertional angina ([place]) 02/08/2017  .  Gastritis and gastroduodenitis   . GERD (gastroesophageal reflux disease)   . Hypothyroidism   . Iron deficiency anemia   . Late effect of cerebrovascular accident (CVA) 05/25/2017  . Late effects of cerebrovascular disease 05/25/2017  . Malignant neoplasm of prostate ([place]) 08/17/2019  . Mixed hyperlipidemia 12/14/2019  . Obesity 01/25/2018  . Osteoarthritis of both knees 08/17/2019  . Pacemaker 08/08/2019  . PAF (paroxysmal atrial fibrillation) ([place])   . Paroxysmal atrial fibrillation ([place]) 04/01/2015  . Peripheral vascular disease ([place])    . Polyp of cecum   . Precordial pain 04/23/2015  . Preinfarction syndrome ([place]) 02/08/2017  . Right-sided carotid artery disease ([place]) 12/29/2016  . Routine general medical examination at a health care facility 04/01/2015  . Sequela of cerebrovascular accident 05/25/2017  . Symptomatic anemia 04/12/2019   Past Surgical History:  Procedure Laterality Date  . BIOPSY  04/11/2019   Procedure: BIOPSY;  Surgeon: Milus Banister, MD;  Location: [facility];  Service: Endoscopy;;  . CATARACT EXTRACTION    . COLONOSCOPY WITH PROPOFOL N/A 04/11/2019   Procedure: COLONOSCOPY WITH PROPOFOL;  Surgeon: Milus Banister, MD;  Location[facility] ENDOSCOPY;  Service: Endoscopy;  Laterality: N/A;  . ESOPHAGOGASTRODUODENOSCOPY (EGD) WITH PROPOFOL N/A 04/11/2019   Procedure: ESOPHAGOGASTRODUODENOSCOPY (EGD) WITH PROPOFOL;  Surgeon: Milus Banister, MD;  Location: [facility] ENDOSCOPY;  Service: Endoscopy;  Laterality: N/A;  . HIP SURGERY    . INSERT / REPLACE / REMOVE PACEMAKER     [employer]  . JOINT REPLACEMENT Right 10/20/1994   Hip  (Left Hip in 10/96)  . LEFT HEART CATH AND CORONARY ANGIOGRAPHY N/A 02/08/2017   Procedure: LEFT HEART CATH AND CORONARY ANGIOGRAPHY;  Surgeon: Martinique, Peter M, MD;  Location: [facility];  Service: Cardiovascular;  Laterality: N/A;  . ORIF PERIPROSTHETIC FRACTURE Right 08/17/2017   Procedure: OPEN REDUCTION INTERNAL FIXATION (ORIF) PERIPROSTHETIC FRACTURE RIGHT FEMUR;  Surgeon: Paralee Cancel, MD;  Location: [facility];  Service: Orthopedics;  Laterality: Right;  . ORIF PERIPROSTHETIC FRACTURE Right 01/23/2018   Procedure: Redo open reduction internal fixation right periprosthetic femur fracture;  Surgeon: Paralee Cancel, MD;  Location: [facility];  Service: Orthopedics;  Laterality: Right;  120 mins  . POLYPECTOMY  04/11/2019   Procedure: POLYPECTOMY;  Surgeon: Milus Banister, MD;  Location: [facility] ENDOSCOPY;  Service: Endoscopy;;    Family History  Problem Relation Age of Onset  . Heart  attack Mother    Social History   Socioeconomic History  . Marital status: Married    Spouse name: Not on file  . Number of children: 2  . Years of education: Not on file  . Highest education level: 6th grade  Occupational History  . Occupation: Retired  Tobacco Use  . Smoking status: Never Smoker  . Smokeless tobacco: Current User    Types: Snuff  . Tobacco comment: 1 can per day  Vaping Use  . Vaping Use: Never used  Substance and Sexual Activity  . Alcohol use: Yes    Alcohol/week: 3.0 standard drinks    Types: 3 Cans of beer per week    Comment: regular use 4 times a week  . Drug use: No  . Sexual activity: Not Currently    Partners: Female    Birth control/protection: None  Other Topics Concern  . Not on file  Social History Narrative  . Not on file   Social Determinants of Health   Financial Resource Strain: Low Risk   . Difficulty of Paying Living Expenses:  Not hard at all  Food Insecurity: No Food Insecurity  . Worried About Charity fundraiser in the Last Year: Never true  . Ran Out of Food in the Last Year: Never true  Transportation Needs: No Transportation Needs  . Lack of Transportation (Medical): No  . Lack of Transportation (Non-Medical): No  Physical Activity: Sufficiently Active  . Days of Exercise per Week: 4 days  . Minutes of Exercise per Session: 60 min  Stress: No Stress Concern Present  . Feeling of Stress : Only a little  Social Connections: Moderately Isolated  . Frequency of Communication with Friends and Family: Never  . Frequency of Social Gatherings with Friends and Family: Never  . Attends Religious Services: 1 to 4 times per year  . Active Member of Clubs or Organizations: No  . Attends Archivist Meetings: Never  . Marital Status: Married    Review of Systems  Constitutional: Negative for activity change and appetite change.  HENT: Negative for congestion and sinus pain.   Eyes: Negative for visual disturbance.   Respiratory: Negative for chest tightness and shortness of breath.   Cardiovascular: Negative for chest pain, palpitations and leg swelling.  Gastrointestinal: Negative for abdominal distention and abdominal pain.  Endocrine: Negative for polyuria.  Genitourinary: Negative for difficulty urinating, dysuria and urgency.  Musculoskeletal: Positive for arthralgias.  Skin: Negative.   Neurological: Negative.   Psychiatric/Behavioral: Negative.      Objective:  BP (!) 150/80   Pulse 92   Temp 97.6 F (36.4 C)   Resp 17   Ht 5\' 2"  (1.575 m)   Wt 189 lb (85.7 kg)   SpO2 95%   BMI 34.57 kg/m   BP/Weight 08/15/2020 0/06/270 10/22/6642  Systolic BP 034 742 595  Diastolic BP 80 58 70  Wt. (Lbs) 189 191 188.2  BMI 34.57 34.93 32.3    Physical Exam Vitals reviewed.  Constitutional:      General: He is not in acute distress.    Appearance: Normal appearance.  HENT:     Right Ear: Tympanic membrane normal.     Left Ear: Tympanic membrane normal.     Nose: Nose normal.     Mouth/Throat:     Mouth: Mucous membranes are moist.     Pharynx: Oropharynx is clear.  Eyes:     Extraocular Movements: Extraocular movements intact.     Conjunctiva/sclera: Conjunctivae normal.     Pupils: Pupils are equal, round, and reactive to light.  Cardiovascular:     Rate and Rhythm: Normal rate and regular rhythm.     Pulses: Normal pulses.     Heart sounds: Normal heart sounds. No murmur heard. No gallop.   Pulmonary:     Effort: Pulmonary effort is normal.     Breath sounds: Normal breath sounds. No rales.  Abdominal:     General: Abdomen is flat. Bowel sounds are normal. There is no distension.     Palpations: Abdomen is soft.     Tenderness: There is no abdominal tenderness.  Musculoskeletal:        General: Tenderness (hands) present. Normal range of motion.     Cervical back: Normal range of motion and neck supple.  Skin:    General: Skin is warm and dry.     Capillary Refill:  Capillary refill takes less than 2 seconds.  Neurological:     General: No focal deficit present.     Mental Status: He is alert and oriented to  person, place, and time. Mental status is at baseline.  Psychiatric:        Mood and Affect: Mood normal.        Thought Content: Thought content normal.       Lab Results  Component Value Date   WBC 5.8 08/15/2020   HGB 13.5 08/15/2020   HCT 40.6 08/15/2020   PLT 232 08/15/2020   GLUCOSE 98 08/15/2020   CHOL 172 08/15/2020   TRIG 104 08/15/2020   HDL 57 08/15/2020   LDLCALC 96 08/15/2020   ALT 15 08/15/2020   AST 19 08/15/2020   NA 141 08/15/2020   K 4.8 08/15/2020   CL 102 08/15/2020   CREATININE 1.22 08/15/2020   BUN 12 08/15/2020   CO2 24 08/15/2020   TSH 3.000 08/15/2020   INR 1.41 08/15/2017   HGBA1C  07/07/2008    5.8 (NOTE)   The ADA recommends the following therapeutic goal for glycemic   control related to Hgb A1C measurement:   Goal of Therapy:   < 7.0% Hgb A1C   Reference: American Diabetes Association: Clinical Practice   Recommendations 2008, Diabetes Care,  2008, 31:(Suppl 1).      Assessment & Plan:   1. Atherosclerosis of native coronary artery of native heart without angina pectoris Patient's CAD was assessed using history and physical along with other information to maximize treatment.  Evidence based criteria was use in deciding proper management for this disease process.  Patient's CAD is under good control.therapy continue present treatment.  2. Essential hypertension, benign An individual hypertension care plan was established and reinforced today.  The patient's status was assessed using clinical findings on exam and labs or diagnostic tests. The patient's success at meeting treatment goals on disease specific evidence-based guidelines and found to be well controlled. SELF MANAGEMENT: The patient and I together assessed ways to personally work towards obtaining the recommended goals. RECOMMENDATIONS:  avoid decongestants found in common cold remedies, decrease consumption of alcohol, perform routine monitoring of BP with home BP cuff, exercise, reduction of dietary salt, take medicines as prescribed, try not to miss doses and quit smoking.  Regular exercise and maintaining a healthy weight is needed.  Stress reduction may help. A CLINICAL SUMMARY including written plan identify barriers to care unique to individual due to social or financial issues.  We attempt to mutually creat solutions for individual and family understanding.  3. Paroxysmal atrial fibrillation [facility]) Patient has a diagnosis of paroxysmal atrial fibrillation.   Patient is on eliquis and has controlled ventricular response.  Patient is CV stable.  4. Hypothyroidism, unspecified type Patient is known to have hypothyroisism and is n treatment with levothyoxine.  Patient was diagnosed 10 years ago.  Other treatment includes none.  Patient is compliant with medicines and last TSH 6 months ago.  Last TSH was normal.  5. Mixed hyperlipidemia AN INDIVIDUAL CARE PLAN for hyperlipidemia/ cholesterol was established and reinforced today.  The patient's status was assessed using clinical findings on exam, lab and other diagnostic tests. The patient's disease status was assessed based on evidence-based guidelines and found to be well controlled. MEDICATIONS were reviewed. SELF MANAGEMENT GOALS have been discussed and patient's success at attaining the goal of low cholesterol was assessed. RECOMMENDATION given include regular exercise 3 days a week and low cholesterol/low fat diet. CLINICAL SUMMARY including written plan to identify barriers unique to the patient due to social or economic  reasons was discussed.  6. Sequela of cerebrovascular accident Patient was  evaluated using information from exam, tests and other diagnostic studies to perform evidence-based treatment for this disorder.  Opimizing treatment and improvement of neurologic  deficits.  Patient is using cane to maintain as much independence as possible. Patient using cane.  Patient has full ability to perform ADLs.  7. Gastroesophageal reflux disease with esophagitis without hemorrhage Plan of care was formulated today.  She is doing well.  A plan of care was formulated using patient exam, tests and other sources to optimize care using evidence based information.  Recommend no smoking, no eating after supper, avoid fatty foods, elevate Head of bed, avoid tight fitting clothing.  Continue on omeprazole.  8. Pacemaker Pacemaker is doing well          I spent 35 minutes dedicated to the care of this patient on the date of this encounter to include face-to-face time with the patient, as well as: review records  Follow-up: Return in about 6 months (around 02/12/2021) for fasting.  An After Visit Summary was printed and given to the patient.  Reinaldo Meeker, MD [facility] [phone]

## 2020-08-16 LAB — LIPID PANEL
Chol/HDL Ratio: 3 ratio (ref 0.0–5.0)
Cholesterol, Total: 172 mg/dL (ref 100–199)
HDL: 57 mg/dL (ref >39)
LDL Chol Calc (NIH): 96 mg/dL (ref 0–99)
Triglycerides: 104 mg/dL (ref 0–149)
VLDL Cholesterol Cal: 19 mg/dL (ref 5–40)

## 2020-08-16 LAB — COMPREHENSIVE METABOLIC PANEL WITH GFR
ALT: 15 IU/L (ref 0–44)
AST: 19 IU/L (ref 0–40)
Albumin/Globulin Ratio: 1.5 (ref 1.2–2.2)
Albumin: 4.2 g/dL (ref 3.6–4.6)
Alkaline Phosphatase: 82 IU/L (ref 44–121)
BUN/Creatinine Ratio: 10 (ref 10–24)
BUN: 12 mg/dL (ref 8–27)
Bilirubin Total: 0.4 mg/dL (ref 0.0–1.2)
CO2: 24 mmol/L (ref 20–29)
Calcium: 9.6 mg/dL (ref 8.6–10.2)
Chloride: 102 mmol/L (ref 96–106)
Creatinine, Ser: 1.22 mg/dL (ref 0.76–1.27)
GFR calc Af Amer: 63 mL/min/1.73
GFR calc non Af Amer: 54 mL/min/1.73 — ABNORMAL LOW
Globulin, Total: 2.8 g/dL (ref 1.5–4.5)
Glucose: 98 mg/dL (ref 65–99)
Potassium: 4.8 mmol/L (ref 3.5–5.2)
Sodium: 141 mmol/L (ref 134–144)
Total Protein: 7 g/dL (ref 6.0–8.5)

## 2020-08-16 LAB — CBC WITH DIFFERENTIAL/PLATELET
Basophils Absolute: 0.1 10*3/uL (ref 0.0–0.2)
Basos: 1 %
EOS (ABSOLUTE): 0.4 10*3/uL (ref 0.0–0.4)
Eos: 6 %
Hematocrit: 40.6 % (ref 37.5–51.0)
Hemoglobin: 13.5 g/dL (ref 13.0–17.7)
Immature Grans (Abs): 0 10*3/uL (ref 0.0–0.1)
Immature Granulocytes: 0 %
Lymphocytes Absolute: 1.3 10*3/uL (ref 0.7–3.1)
Lymphs: 23 %
MCH: 31.2 pg (ref 26.6–33.0)
MCHC: 33.3 g/dL (ref 31.5–35.7)
MCV: 94 fL (ref 79–97)
Monocytes Absolute: 0.8 10*3/uL (ref 0.1–0.9)
Monocytes: 14 %
Neutrophils Absolute: 3.3 10*3/uL (ref 1.4–7.0)
Neutrophils: 56 %
Platelets: 232 10*3/uL (ref 150–450)
RBC: 4.33 x10E6/uL (ref 4.14–5.80)
RDW: 12.7 % (ref 11.6–15.4)
WBC: 5.8 10*3/uL (ref 3.4–10.8)

## 2020-08-16 LAB — PSA: Prostate Specific Ag, Serum: 6.8 ng/mL — ABNORMAL HIGH (ref 0.0–4.0)

## 2020-08-16 LAB — TSH: TSH: 3 u[IU]/mL (ref 0.450–4.500)

## 2020-08-16 LAB — CARDIOVASCULAR RISK ASSESSMENT

## 2020-08-17 NOTE — Progress Notes (Signed)
Kidney tests stable, liver tests normal, Cholesterol normal, PSA 6.8 has he seen his urologist, CBC still mild anemia, Tsh 3.00 normal lp

## 2020-08-18 ENCOUNTER — Ambulatory Visit (INDEPENDENT_AMBULATORY_CARE_PROVIDER_SITE_OTHER): Payer: PPO

## 2020-08-18 DIAGNOSIS — I480 Paroxysmal atrial fibrillation: Secondary | ICD-10-CM

## 2020-08-18 NOTE — Progress Notes (Signed)
Get him a urology appointment for high PSA and prostate cancer lp

## 2020-08-19 ENCOUNTER — Telehealth: Payer: Self-pay

## 2020-08-19 NOTE — Telephone Encounter (Signed)
I called patient and I talked with his wife [name] to know if he has any preference for any urologist, however she said that patient does not want to see any urologist right now. I notified Dr Henrene Pastor.

## 2020-08-20 LAB — CUP PACEART REMOTE DEVICE CHECK
Battery Impedance: 1140 Ohm
Battery Remaining Longevity: 38 mo
Battery Voltage: 2.76 V
Brady Statistic AP VP Percent: 17 %
Brady Statistic AP VS Percent: 0 %
Brady Statistic AS VP Percent: 83 %
Brady Statistic AS VS Percent: 0 %
Date Time Interrogation Session: 20220228101047
Implantable Lead Implant Date: 20150319
Implantable Lead Implant Date: 20150319
Implantable Lead Location: 753859
Implantable Lead Location: 753860
Implantable Lead Model: 4092
Implantable Lead Model: 5076
Implantable Pulse Generator Implant Date: 20150319
Lead Channel Impedance Value: 420 Ohm
Lead Channel Impedance Value: 531 Ohm
Lead Channel Pacing Threshold Amplitude: 1 V
Lead Channel Pacing Threshold Amplitude: 2 V
Lead Channel Pacing Threshold Pulse Width: 0.4 ms
Lead Channel Pacing Threshold Pulse Width: 0.4 ms
Lead Channel Setting Pacing Amplitude: 2 V
Lead Channel Setting Pacing Amplitude: 4 V
Lead Channel Setting Pacing Pulse Width: 0.4 ms
Lead Channel Setting Sensing Sensitivity: 2 mV

## 2020-08-25 NOTE — Progress Notes (Signed)
Remote pacemaker transmission.   

## 2020-08-27 ENCOUNTER — Ambulatory Visit (INDEPENDENT_AMBULATORY_CARE_PROVIDER_SITE_OTHER): Payer: PPO

## 2020-08-27 ENCOUNTER — Other Ambulatory Visit: Payer: Self-pay

## 2020-08-27 DIAGNOSIS — I442 Atrioventricular block, complete: Secondary | ICD-10-CM

## 2020-08-27 DIAGNOSIS — I480 Paroxysmal atrial fibrillation: Secondary | ICD-10-CM

## 2020-08-27 DIAGNOSIS — Z950 Presence of cardiac pacemaker: Secondary | ICD-10-CM

## 2020-08-27 DIAGNOSIS — I10 Essential (primary) hypertension: Secondary | ICD-10-CM | POA: Diagnosis not present

## 2020-08-27 LAB — ECHOCARDIOGRAM COMPLETE
AR max vel: 0.96 cm2
AV Area VTI: 0.91 cm2
AV Area mean vel: 0.94 cm2
AV Mean grad: 21 mmHg
AV Peak grad: 35.8 mmHg
Ao pk vel: 2.99 m/s
Area-P 1/2: 3.85 cm2
Calc EF: 48.7 %
P 1/2 time: 376 msec
S' Lateral: 3.1 cm
Single Plane A2C EF: 52.3 %
Single Plane A4C EF: 48 %

## 2020-08-27 NOTE — Progress Notes (Signed)
Complete echocardiogram performed.  Jimmy Reel RDCS, RVT  

## 2020-09-04 ENCOUNTER — Telehealth: Payer: Self-pay

## 2020-09-04 NOTE — Progress Notes (Signed)
    Chronic Care Management Pharmacy Assistant   Name: [name]  MRN: [id number] DOB: 1935/02/02   Reason for Encounter: Medication Review for Flovent PAP status    Medications: Outpatient Encounter Medications as of 09/04/2020  Medication Sig  . albuterol (VENTOLIN HFA) 108 (90 Base) MCG/ACT inhaler INHALE 2 PUFFS BY MOUTH EVERY 6 HOURS AS NEEDED  . amLODipine (NORVASC) 5 MG tablet TAKE 1 TABLET(5 MG) BY MOUTH AT BEDTIME  . apixaban (ELIQUIS) 5 MG TABS tablet Take 1 tablet (5 mg total) by mouth 2 (two) times daily.  Marland Kitchen atorvastatin (LIPITOR) 40 MG tablet TAKE 1 TABLET BY MOUTH DAILY  . Calcium Carbonate-Vitamin D (CALCIUM 500 + D PO) Take 1 tablet by mouth daily.  . FEROSUL 325 (65 Fe) MG tablet TAKE 1 TABLET(325 MG) BY MOUTH TWICE DAILY  . FLOVENT HFA 110 MCG/ACT inhaler INHALE 2 PUFFS BY MOUTH TWICE DAILY  . HYDROcodone-acetaminophen (NORCO/VICODIN) 5-325 MG tablet Take 1 tablet by mouth 2 (two) times daily as needed (pain).  . isosorbide mononitrate (IMDUR) 60 MG 24 hr tablet TAKE 1 TABLET(60 MG) BY MOUTH DAILY  . levothyroxine (SYNTHROID) 50 MCG tablet TAKE 1 TABLET BY MOUTH DAILY  . Multiple Vitamin (MULTIVITAMIN WITH MINERALS) TABS tablet Take 1 tablet by mouth daily.  . nitroGLYCERIN (NITROSTAT) 0.4 MG SL tablet DISSOLVE 1 TABLET UNDER THE TONGUE EVERY 5 MINUTES AS NEEDED FOR CHEST PAIN. MAXIMUM OF 3 DOSES IN 15 MINUTES  . omeprazole (PRILOSEC) 20 MG capsule Take 20 mg by mouth daily.  . ranolazine (RANEXA) 500 MG 12 hr tablet TAKE 1 TABLET(500 MG) BY MOUTH TWICE DAILY  . vitamin B-12 (CYANOCOBALAMIN) 1000 MCG tablet Take 1,000 mcg by mouth daily.    No facility-administered encounter medications on file as of 09/04/2020.    Spoke with [facility] representative as to the status of the patients Flovent.  Per the representative the application had not been received.  I have notified Donette Larry, CPP, per Donette Larry, the form can't be sent out till be patient spends 600.00 out of  pocket.  Clarita Leber, [place] Pharmacist Assistant [phone]

## 2020-09-05 ENCOUNTER — Telehealth: Payer: Self-pay

## 2020-09-05 ENCOUNTER — Other Ambulatory Visit: Payer: Self-pay

## 2020-09-05 MED ORDER — APIXABAN 5 MG PO TABS: 5.0000 mg | ORAL_TABLET | Freq: Two times a day (BID) | ORAL | 6 refills | Status: AC

## 2020-09-05 NOTE — Telephone Encounter (Cosign Needed)
  Chronic Care Management   Note  09/05/2020 Name: [name] MRN: [id number] DOB: Dec 22, 1934   Patient's wife called and stated that he is almost out of Eliquis. Pharmacist let patient know that patient assistance won't be approved until they submit proof of spending 3% of income on medication (~$640). Pharmacist will submit application for Flovent and Eliquis once patient has spent and provided the out of pocket information.   Patient's wife reports that they need refill of Eliquis sent to [facility]. Pharmacist requested from Dr. Blanch Media nurse.   Sherre Poot, PharmD, [facility] Clinical Pharmacist [facility] [phone] (office) [phone] (mobile)

## 2020-09-10 ENCOUNTER — Telehealth: Payer: Self-pay

## 2020-09-10 NOTE — Progress Notes (Signed)
    Chronic Care Management Pharmacy Assistant   Name: KWAMAINE CUPPETT  MRN: [id number] DOB: [birth year]   Reason for Encounter: Adherence review     Medications: Outpatient Encounter Medications as of 09/10/2020  Medication Sig  . albuterol (VENTOLIN HFA) 108 (90 Base) MCG/ACT inhaler INHALE 2 PUFFS BY MOUTH EVERY 6 HOURS AS NEEDED  . amLODipine (NORVASC) 5 MG tablet TAKE 1 TABLET(5 MG) BY MOUTH AT BEDTIME  . apixaban (ELIQUIS) 5 MG TABS tablet Take 1 tablet (5 mg total) by mouth 2 (two) times daily.  Marland Kitchen atorvastatin (LIPITOR) 40 MG tablet TAKE 1 TABLET BY MOUTH DAILY  . Calcium Carbonate-Vitamin D (CALCIUM 500 + D PO) Take 1 tablet by mouth daily.  . FEROSUL 325 (65 Fe) MG tablet TAKE 1 TABLET(325 MG) BY MOUTH TWICE DAILY  . FLOVENT HFA 110 MCG/ACT inhaler INHALE 2 PUFFS BY MOUTH TWICE DAILY  . HYDROcodone-acetaminophen (NORCO/VICODIN) 5-325 MG tablet Take 1 tablet by mouth 2 (two) times daily as needed (pain).  . isosorbide mononitrate (IMDUR) 60 MG 24 hr tablet TAKE 1 TABLET(60 MG) BY MOUTH DAILY  . levothyroxine (SYNTHROID) 50 MCG tablet TAKE 1 TABLET BY MOUTH DAILY  . Multiple Vitamin (MULTIVITAMIN WITH MINERALS) TABS tablet Take 1 tablet by mouth daily.  . nitroGLYCERIN (NITROSTAT) 0.4 MG SL tablet DISSOLVE 1 TABLET UNDER THE TONGUE EVERY 5 MINUTES AS NEEDED FOR CHEST PAIN. MAXIMUM OF 3 DOSES IN 15 MINUTES  . omeprazole (PRILOSEC) 20 MG capsule Take 20 mg by mouth daily.  . ranolazine (RANEXA) 500 MG 12 hr tablet TAKE 1 TABLET(500 MG) BY MOUTH TWICE DAILY  . vitamin B-12 (CYANOCOBALAMIN) 1000 MCG tablet Take 1,000 mcg by mouth daily.    No facility-administered encounter medications on file as of 09/10/2020.    Patient has appointments on: Donette Larry, CPP-10/08/20 Dr. Perry-02/17/21  Adherence review  RAF Score 3.502 RAF Source  Last Enc Date at Attr PCP (mm/dd/yyyy) 04/17/2020 Last Medicare AWV (mm/dd/yyyy) 02/07/2017 Last Comp Visit (mm/dd/yyyy) 1/0/1900 Most Recent  A1C 0 A1C Date 1/0/1900 Most Recent Blood Pressure 130/76 Blood Pressure Date 04/17/2020 Total Gaps - All Measures 2     Total Gaps - Star Measures 0 ? Breast Cancer Screening (BCS) 0 ? Controlling High Blood Pressure (CBP) 0 ? Diabetes: HbA1c Control <= 9.0% (CDC-A1c) 0 ? Diabetes: Eye exam (retinal) performed (CDC-Eye) 0 ? Diabetes: Medical attention for nephropathy (CDC-Neph) 0 ? Care for Older Adults (COA): Medication Review 0 ? Care for Older Adults (COA): Pain Assessment 0 ? Colorectal Cancer Screening (COL) 0 ? Medication Adherence for Cholesterol (Statins) (MAC) Met: Med Compliance CHOL:  90-99% ? Medication Adherence for Diabetes Medications (MAD) 0 ? Medication Adherence for Hypertension (RAS antogoniststs) [facility]) 0 ? Medication Reconciliation Post-Discharge (MRP) 0 ? Osteoporosis Management in Women Who Had a Fracture (OMW) 0 ? Plan All-Cause Readmissions (PCR) 0 ? Statin Therapy for Patients with Cardiovascular Disease (SPC) 0 ? Statin Use in Persons with Diabetes (SUPD) 0 ? Statin Use in Persons with Diabetes (SUPD) 0 Visits: Wellness Bundle [facility]) Not Met: AWV not performed Visits: Annual Wellness Visit (AWV) Not Met: AWV not performed Controlling High Blood Pressure (CBP) Met: BP < 140/90 Diabetes: HbA1c Testing (CDC-A1c Testing) 0 Diabetes: Eye exam (retinal) performed (CDC-Eye) 0 Care for Older Adults (COA): Medication Review 0 Care for Older Adults (COA): Pain Assessment 0 Plan All-Cause Readmissions (PCR) [place], [place] Pharmacist Assistant [phone]

## 2020-09-11 ENCOUNTER — Other Ambulatory Visit: Payer: Self-pay

## 2020-09-11 DIAGNOSIS — I6930 Unspecified sequelae of cerebral infarction: Secondary | ICD-10-CM

## 2020-09-11 MED ORDER — HYDROCODONE-ACETAMINOPHEN 5-325 MG PO TABS: 1.0000 | ORAL_TABLET | Freq: Two times a day (BID) | ORAL | 0 refills | Status: DC | PRN

## 2020-10-08 ENCOUNTER — Other Ambulatory Visit: Payer: Self-pay

## 2020-10-08 ENCOUNTER — Ambulatory Visit (INDEPENDENT_AMBULATORY_CARE_PROVIDER_SITE_OTHER): Payer: PPO

## 2020-10-08 DIAGNOSIS — E782 Mixed hyperlipidemia: Secondary | ICD-10-CM | POA: Diagnosis not present

## 2020-10-08 DIAGNOSIS — I480 Paroxysmal atrial fibrillation: Secondary | ICD-10-CM

## 2020-10-08 DIAGNOSIS — I2510 Atherosclerotic heart disease of native coronary artery without angina pectoris: Secondary | ICD-10-CM

## 2020-10-08 DIAGNOSIS — I739 Peripheral vascular disease, unspecified: Secondary | ICD-10-CM

## 2020-10-08 NOTE — Progress Notes (Signed)
Chronic Care Management Pharmacy Note  10/09/2020 Name:  [name] MRN:  [id number] DOB:  [birth year]   Plan Updates:   Patient is to bring in proof of expense once eligible for Eliquis and Trelegy Patient assistance. Patient needs to have spent 3% of income on out of pocket for Eliquis and $600 for Trelegy. Wife acknowledges understanding.   Discussed healthy diet and activity to improve cholesterol management. Recommend considering atorvastatin 80 mg daily at next labs if elevated above goal.   Subjective: MARQUEE Ruiz is an 86 y.o. year old male who is a primary patient of Henrene Pastor, Zeb Comfort, MD.  The CCM team was consulted for assistance with disease management and care coordination needs.    Engaged with patient by telephone for follow up visit in response to provider referral for pharmacy case management and/or care coordination services.   Consent to Services:  The patient was given information about Chronic Care Management services, agreed to services, and gave verbal consent prior to initiation of services.  Please see initial visit note for detailed documentation.   Patient Care Team: Lillard Anes, MD as PCP - General (Family Medicine) Constance Haw, MD as PCP - Electrophysiology (Cardiology) Park Liter, MD as PCP - Cardiology (Cardiology) Burnice Logan, [facility] as Pharmacist (Pharmacist)  Recent office visits: 08/15/2020 - Get him a urology appointment for high PSA and prostate cancer Recent consult visits: None reported  Hospital visits: None in previous 6 months  Objective:  Lab Results  Component Value Date   CREATININE 1.22 08/15/2020   BUN 12 08/15/2020   GFRNONAA 54 (L) 08/15/2020   GFRAA 63 08/15/2020   NA 141 08/15/2020   K 4.8 08/15/2020   CALCIUM 9.6 08/15/2020   CO2 24 08/15/2020   GLUCOSE 98 08/15/2020    Lab Results  Component Value Date/Time   HGBA1C  07/07/2008 03:59 AM    5.8 (NOTE)   The ADA  recommends the following therapeutic goal for glycemic   control related to Hgb A1C measurement:   Goal of Therapy:   < 7.0% Hgb A1C   Reference: American Diabetes Association: Clinical Practice   Recommendations 2008, Diabetes Care,  2008, 31:(Suppl 1).   HGBA1C  06/29/2007 02:10 AM    5.6 (NOTE)   The ADA recommends the following therapeutic goals for glycemic   control related to Hgb A1C measurement:   Goal of Therapy:   < 7.0% Hgb A1C   Action Suggested:  > 8.0% Hgb A1C   Ref:  Diabetes Care, 22, Suppl. 1, 1999    Last diabetic Eye exam: No results found for: HMDIABEYEEXA  Last diabetic Foot exam: No results found for: HMDIABFOOTEX   Lab Results  Component Value Date   CHOL 172 08/15/2020   HDL 57 08/15/2020   LDLCALC 96 08/15/2020   TRIG 104 08/15/2020   CHOLHDL 3.0 08/15/2020    Hepatic Function Latest Ref Rng & Units 08/15/2020 04/17/2020 12/14/2019  Total Protein 6.0 - 8.5 g/dL 7.0 6.6 6.8  Albumin 3.6 - 4.6 g/dL 4.2 4.1 4.2  AST 0 - 40 IU/L 19 22 19   ALT 0 - 44 IU/L 15 13 13   Alk Phosphatase 44 - 121 IU/L 82 79 82  Total Bilirubin 0.0 - 1.2 mg/dL 0.4 0.4 0.5  Bilirubin, Direct 0.0 - 0.3 mg/dL - - -    Lab Results  Component Value Date/Time   TSH 3.000 08/15/2020 09:28 AM   TSH 3.920 04/17/2020 09:42 AM  CBC Latest Ref Rng & Units 08/15/2020 04/17/2020 12/14/2019  WBC 3.4 - 10.8 x10E3/uL 5.8 5.1 5.3  Hemoglobin 13.0 - 17.7 g/dL 13.5 12.3(L) 12.6(L)  Hematocrit 37.5 - 51.0 % 40.6 37.4(L) 38.0  Platelets 150 - 450 x10E3/uL 232 231 212    No results found for: VD25OH  Clinical ASCVD: Yes  The ASCVD Risk score Mikey Bussing DC Jr., et al., 2013) failed to calculate for the following reasons:   The 2013 ASCVD risk score is only valid for ages 32 to 73    Depression screen PHQ 2/9 06/25/2020 08/17/2019  Decreased Interest 1 0  Down, Depressed, Hopeless 0 0  PHQ - 2 Score 1 0     Social History   Tobacco Use  Smoking Status Never Smoker  Smokeless Tobacco Current User  .  Types: Snuff  Tobacco Comment   1 can per day   BP Readings from Last 3 Encounters:  08/15/20 (!) 150/80  07/24/20 (!) 156/58  06/25/20 (!) 148/70   Pulse Readings from Last 3 Encounters:  08/15/20 92  07/24/20 78  06/25/20 86   Wt Readings from Last 3 Encounters:  08/15/20 189 lb (85.7 kg)  07/24/20 191 lb (86.6 kg)  06/25/20 188 lb 3.2 oz (85.4 kg)   BMI Readings from Last 3 Encounters:  08/15/20 34.57 kg/m  07/24/20 34.93 kg/m  06/25/20 32.30 kg/m    Assessment/Interventions: Review of patient past medical history, allergies, medications, health status, including review of consultants reports, laboratory and other test data, was performed as part of comprehensive evaluation and provision of chronic care management services.   SDOH:  (Social Determinants of Health) assessments and interventions performed: Yes  SDOH Screenings   Alcohol Screen: Low Risk   . Last Alcohol Screening Score (AUDIT): 0  Depression (PHQ2-9): Low Risk   . PHQ-2 Score: 1  Financial Resource Strain: Low Risk   . Difficulty of Paying Living Expenses: Not hard at all  Food Insecurity: No Food Insecurity  . Worried About Charity fundraiser in the Last Year: Never true  . Ran Out of Food in the Last Year: Never true  Housing: Low Risk   . Last Housing Risk Score: 0  Physical Activity: Sufficiently Active  . Days of Exercise per Week: 4 days  . Minutes of Exercise per Session: 60 min  Social Connections: Moderately Isolated  . Frequency of Communication with Friends and Family: Never  . Frequency of Social Gatherings with Friends and Family: Never  . Attends Religious Services: 1 to 4 times per year  . Active Member of Clubs or Organizations: No  . Attends Archivist Meetings: Never  . Marital Status: Married  Stress: No Stress Concern Present  . Feeling of Stress : Only a little  Tobacco Use: High Risk  . Smoking Tobacco Use: Never Smoker  . Smokeless Tobacco Use: Current User   Transportation Needs: Not on file    CCM Care Plan  No Known Allergies  Medications Reviewed Today    Reviewed by Burnice Logan, [facility] (Pharmacist) on 10/08/20 at [facility] List Status: <None>  Medication Order Taking? Sig Documenting Provider Last Dose Status Informant  albuterol (VENTOLIN HFA) 108 (90 Base) MCG/ACT inhaler [id number] Yes INHALE 2 PUFFS BY MOUTH EVERY 6 HOURS AS NEEDED Lillard Anes, MD Taking Active   amLODipine (NORVASC) 5 MG tablet [id number] Yes TAKE 1 TABLET(5 MG) BY MOUTH AT BEDTIME Park Liter, MD Taking Active   apixaban Arne Cleveland) 5  MG TABS tablet [id number] Yes Take 1 tablet (5 mg total) by mouth 2 (two) times daily. Lillard Anes, MD Taking Active   atorvastatin (LIPITOR) 40 MG tablet [id number] Yes TAKE 1 TABLET BY MOUTH DAILY Lillard Anes, MD Taking Active   FEROSUL 325 (65 Fe) MG tablet [id number] Yes TAKE 1 TABLET(325 MG) BY MOUTH TWICE DAILY  Patient taking differently: Take 325 mg by mouth daily.   Lillard Anes, MD Taking Active   FLOVENT [facility] 110 MCG/ACT inhaler [id number] Yes INHALE 2 PUFFS BY MOUTH TWICE DAILY Lillard Anes, MD Taking Active   HYDROcodone-acetaminophen (NORCO/VICODIN) 5-325 MG tablet [id number] Yes Take 1 tablet by mouth 2 (two) times daily as needed (pain). Lillard Anes, MD Taking Active   isosorbide mononitrate (IMDUR) 60 MG 24 hr tablet [id number] Yes TAKE 1 TABLET(60 MG) BY MOUTH DAILY Park Liter, MD Taking Active   levothyroxine (SYNTHROID) 50 MCG tablet [id number] Yes TAKE 1 TABLET BY MOUTH DAILY Lillard Anes, MD Taking Active   Multiple Vitamin (MULTIVITAMIN WITH MINERALS) TABS tablet [id number] Yes Take 1 tablet by mouth daily. [provider] Taking Active Spouse/Significant Other  nitroGLYCERIN (NITROSTAT) 0.4 MG SL tablet [id number] Yes DISSOLVE 1 TABLET UNDER THE TONGUE EVERY 5 MINUTES AS NEEDED FOR CHEST PAIN. MAXIMUM OF 3 DOSES IN 15 MINUTES  Park Liter, MD Taking Active   omeprazole (PRILOSEC) 20 MG capsule [id number] Yes Take 20 mg by mouth daily. [provider] Taking Active   ranolazine (RANEXA) 500 MG 12 hr tablet [id number] Yes TAKE 1 TABLET(500 MG) BY MOUTH TWICE DAILY Park Liter, MD Taking Active   vitamin B-12 (CYANOCOBALAMIN) 1000 MCG tablet [id number] Yes Take 1,000 mcg by mouth daily.  [provider] Taking Active Spouse/Significant Other           Med Note Danny Lawless, Roxan Diesel Jul 31, 2018  9:46 AM)            Patient Active Problem List   Diagnosis Date Noted  . GERD (gastroesophageal reflux disease)   . Chronic anemia   . Asthma   . Arthritis   . BMI 32.0-32.9,adult 04/17/2020  . Mixed hyperlipidemia 12/14/2019  . Osteoarthritis of both knees 08/17/2019  . Malignant neoplasm of prostate ([place]) 08/17/2019  . Pacemaker 08/08/2019  . Symptomatic anemia 04/12/2019  . CKD (chronic kidney disease), stage III ([place]) 04/12/2019  . Iron deficiency anemia   . Gastritis and gastroduodenitis   . Coronary atherosclerosis of native coronary artery 04/09/2019  . Acute on chronic combined systolic and diastolic CHF (congestive heart failure) ([place]) 04/09/2019  . Obesity 01/25/2018  . Essential hypertension, benign 08/15/2017  . Late effect of cerebrovascular accident (CVA) 05/25/2017  . Late effects of cerebrovascular disease 05/25/2017  . Sequela of cerebrovascular accident 05/25/2017  . Exertional angina ([place]) 02/08/2017  . Dyslipidemia 12/29/2016  . Right-sided carotid artery disease ([place]) 12/29/2016  . Disorder of carotid artery ([place]) 12/29/2016  . Peripheral vascular disease ([place]) 04/23/2015  . Complete heart block ([place]) 04/01/2015  . Hypothyroidism 04/01/2015  . Routine general medical examination at a health care facility 04/01/2015  . Paroxysmal atrial fibrillation ([place]) 04/01/2015  . Complete atrioventricular block ([place]) 04/01/2015    Immunization History   Administered Date(s) Administered  . Janssen (J&J) SARS-COV-2 Vaccination 09/27/2019    Conditions to be addressed/monitored:  Hyperlipidemia and Atrial Fibrillation  Care Plan : [place]  Updates made by Burnice Logan, [facility] since 10/09/2020  12:00 AM    Problem: afib, copd, hld   Priority: High  Onset Date: 10/08/2020  Note:    Current Barriers:  . Unable to independently afford treatment regimen  Pharmacist Clinical Goal(s):  Marland Kitchen Patient will verbalize ability to afford treatment regimen through collaboration with PharmD and provider.   Interventions: . 1:1 collaboration with Lillard Anes, MD regarding development and update of comprehensive plan of care as evidenced by provider attestation and co-signature . Inter-disciplinary care team collaboration (see longitudinal plan of care) . Comprehensive medication review performed; medication list updated in electronic medical record  Hyperlipidemia: (LDL goal < 55) -Not ideally controlled -Current treatment: . Atorvastatin 40 mg daily  -Medications previously tried: none reported  -Current dietary patterns: eats out and at home -Current exercise habits: walks in driveway -Educated on Cholesterol goals;  Benefits of statin for ASCVD risk reduction; Importance of limiting foods high in cholesterol; Exercise goal of 150 minutes per week; -Counseled on diet and exercise extensively Recommended to continue current medication Recommended heart healthy diet.   Atrial Fibrillation (Goal: prevent stroke and major bleeding) -Controlled -CHADSVASC: 3 -Current treatment: . Rate control: pacemaker . Anticoagulation: Eliquis 5 mg bid  -Medications previously tried: none reported -Home BP and HR readings: well controlled per wife  -Counseled on increased risk of stroke due to Afib and benefits of anticoagulation for stroke prevention; importance of adherence to anticoagulant exactly as prescribed; avoidance of  NSAIDs due to increased bleeding risk with anticoagulants; seeking medical attention after a head injury or if there is blood in the urine/stool; -Counseled on diet and exercise extensively Recommended to continue current medication Assessed patient finances. to determine out of pocket expense for Eliquis PAP. Wife is going to ask pharmacy for printout this week and let pharmacist know if they have met out of pocket requirement.    Patient Goals/Self-Care Activities . Patient will:  - take medications as prescribed focus on medication adherence by using pill box engage in dietary modifications by heart healthy diet  Follow Up Plan: Telephone follow up appointment with care management team member scheduled for: 08/2021      Medication Assistance: Application for Eliquis and Trelegy  medication assistance program. in process.  Anticipated assistance start date once patient meets 3% and $600 limite.  See plan of care for additional detail.  Patient's preferred pharmacy is:  [id number], [place] - [address] AT [address] [place] [address] Phone: [phone] Fax: [phone]  Uses pill box? Yes Pt endorses 100% compliance  We discussed: Benefits of medication synchronization, packaging and delivery as well as enhanced pharmacist oversight with [employer]. Patient decided to: Continue current medication management strategy  Care Plan and Follow Up Patient Decision:  Patient agrees to Care Plan and Follow-up.  Plan: Telephone follow up appointment with care management team member scheduled for:  ~ 1 year

## 2020-10-09 NOTE — Patient Instructions (Addendum)
Visit Information  Goals Addressed            This Visit's Progress   . Learn More About My Health       Timeframe:  Long-Range Goal Priority:  High Start Date:                             Expected End Date:                        Follow Up Date 08/2021    - tell my story and reason for my visit - ask questions - bring a list of my medicines to the visit - speak up when I don't understand    Why is this important?    The best way to learn about your health and care is by talking to the doctor and nurse.   They will answer your questions and give you information in the way that you like best.    Notes:     . Track and Manage Heart Rate and Rhythm-Atrial Fibrillation       Timeframe:  Long-Range Goal Priority:  High Start Date:                             Expected End Date:                       Follow Up Date 08/2021   - make a plan to eat healthy - take medicine as prescribed    Why is this important?    Atrial fibrillation may have no symptoms. Sometimes the symptoms get worse or happen more often.   It is important to keep track of what your symptoms are and when they happen.   A change in symptoms is important to discuss with your doctor or nurse.   Being active and healthy eating will also help you manage your heart condition.     Notes:       Patient Care Plan: CCM Pharmacy Care Plan    Problem Identified: afib, copd, hld   Priority: High  Onset Date: 10/08/2020  Note:    Current Barriers:  . Unable to independently afford treatment regimen  Pharmacist Clinical Goal(s):  Marland Kitchen Patient will verbalize ability to afford treatment regimen through collaboration with PharmD and provider.   Interventions: . 1:1 collaboration with Lillard Anes, MD regarding development and update of comprehensive plan of care as evidenced by provider attestation and co-signature . Inter-disciplinary care team collaboration (see longitudinal plan of  care) . Comprehensive medication review performed; medication list updated in electronic medical record  Hyperlipidemia: (LDL goal < 55) -Not ideally controlled -Current treatment: . Atorvastatin 40 mg daily  -Medications previously tried: none reported  -Current dietary patterns: eats out and at home -Current exercise habits: walks in driveway -Educated on Cholesterol goals;  Benefits of statin for ASCVD risk reduction; Importance of limiting foods high in cholesterol; Exercise goal of 150 minutes per week; -Counseled on diet and exercise extensively Recommended to continue current medication Recommended heart healthy diet.   Atrial Fibrillation (Goal: prevent stroke and major bleeding) -Controlled -CHADSVASC: 3 -Current treatment: . Rate control: pacemaker . Anticoagulation: Eliquis 5 mg bid  -Medications previously tried: none reported -Home BP and HR readings: well controlled per wife  -Counseled on increased risk of stroke due to Afib  and benefits of anticoagulation for stroke prevention; importance of adherence to anticoagulant exactly as prescribed; avoidance of NSAIDs due to increased bleeding risk with anticoagulants; seeking medical attention after a head injury or if there is blood in the urine/stool; -Counseled on diet and exercise extensively Recommended to continue current medication Assessed patient finances. to determine out of pocket expense for Eliquis PAP. Wife is going to ask pharmacy for printout this week and let pharmacist know if they have met out of pocket requirement.    Patient Goals/Self-Care Activities . Patient will:  - take medications as prescribed focus on medication adherence by using pill box engage in dietary modifications by heart healthy diet  Follow Up Plan: Telephone follow up appointment with care management team member scheduled for: 08/2021      The patient verbalized understanding of instructions, educational materials, and care  plan provided today and declined offer to receive copy of patient instructions, educational materials, and care plan.  Telephone follow up appointment with pharmacy team member scheduled for: 08/2021  Burnice Logan, [facility]     Apixaban Tablets What is this medicine? APIXABAN (a PIX a ban) is an anticoagulant (blood thinner). It is used to lower the chance of stroke in people with a medical condition called atrial fibrillation. It is also used to treat or prevent blood clots in the lungs or in the veins. This medicine may be used for other purposes; ask your health care provider or pharmacist if you have questions. COMMON BRAND NAME(S): Eliquis What should I tell my health care provider before I take this medicine? They need to know if you have any of these conditions:  antiphospholipid antibody syndrome  bleeding disorders  bleeding in the brain  blood in your stools (black or tarry stools) or if you have blood in your vomit  history of blood clots  history of stomach bleeding  kidney disease  liver disease  mechanical heart valve  an unusual or allergic reaction to apixaban, other medicines, foods, dyes, or preservatives  pregnant or trying to get pregnant  breast-feeding How should I use this medicine? Take this medicine by mouth with a glass of water. Follow the directions on the prescription label. You can take it with or without food. If it upsets your stomach, take it with food. Take your medicine at regular intervals. Do not take it more often than directed. Do not stop taking except on your doctor's advice. Stopping this medicine may increase your risk of a blood clot. Be sure to refill your prescription before you run out of medicine. Talk to your pediatrician regarding the use of this medicine in children. Special care may be needed. Overdosage: If you think you have taken too much of this medicine contact a poison control center or emergency room at once. NOTE: This  medicine is only for you. Do not share this medicine with others. What if I miss a dose? If you miss a dose, take it as soon as you can. If it is almost time for your next dose, take only that dose. Do not take double or extra doses. What may interact with this medicine? This medicine may interact with the following:  aspirin and aspirin-like medicines  certain medicines for fungal infections like ketoconazole and itraconazole  certain medicines for seizures like carbamazepine and phenytoin  certain medicines that treat or prevent blood clots like warfarin, enoxaparin, and dalteparin  clarithromycin  NSAIDs, medicines for pain and inflammation, like ibuprofen or naproxen  rifampin  ritonavir  St. John's wort This list may not describe all possible interactions. Give your health care provider a list of all the medicines, herbs, non-prescription drugs, or dietary supplements you use. Also tell them if you smoke, drink alcohol, or use illegal drugs. Some items may interact with your medicine. What should I watch for while using this medicine? Visit your healthcare professional for regular checks on your progress. You may need blood work done while you are taking this medicine. Your condition will be monitored carefully while you are receiving this medicine. It is important not to miss any appointments. Avoid sports and activities that might cause injury while you are using this medicine. Severe falls or injuries can cause unseen bleeding. Be careful when using sharp tools or knives. Consider using an Copy. Take special care brushing or flossing your teeth. Report any injuries, bruising, or red spots on the skin to your healthcare professional. If you are going to need surgery or other procedure, tell your healthcare professional that you are taking this medicine. Wear a medical ID bracelet or chain. Carry a card that describes your disease and details of your medicine and dosage  times. What side effects may I notice from receiving this medicine? Side effects that you should report to your doctor or health care professional as soon as possible:  allergic reactions like skin rash, itching or hives, swelling of the face, lips, or tongue  signs and symptoms of bleeding such as bloody or black, tarry stools; red or dark-Mckenzee Beem urine; spitting up blood or Jossalyn Forgione material that looks like coffee grounds; red spots on the skin; unusual bruising or bleeding from the eye, gums, or nose  signs and symptoms of a blood clot such as chest pain; shortness of breath; pain, swelling, or warmth in the leg  signs and symptoms of a stroke such as changes in vision; confusion; trouble speaking or understanding; severe headaches; sudden numbness or weakness of the face, arm or leg; trouble walking; dizziness; loss of coordination This list may not describe all possible side effects. Call your doctor for medical advice about side effects. You may report side effects to FDA at 1-800-FDA-1088. Where should I keep my medicine? Keep out of the reach of children. Store at room temperature between 20 and 25 degrees C (68 and 77 degrees F). Throw away any unused medicine after the expiration date. NOTE: This sheet is a summary. It may not cover all possible information. If you have questions about this medicine, talk to your doctor, pharmacist, or health care provider.  2021 Elsevier/Gold Standard (2020-04-16 16:54:11)

## 2020-10-10 ENCOUNTER — Other Ambulatory Visit: Payer: Self-pay

## 2020-10-10 DIAGNOSIS — I6930 Unspecified sequelae of cerebral infarction: Secondary | ICD-10-CM

## 2020-10-10 IMAGING — CT CT ABD-PELV W/ CM
2 of 5 series · 16 of 46 positions shown, 18 images · IV contrast (APPLIED)
Comparison: 05/03/2017 from [facility]

CLINICAL DATA: Iron deficiency anemia.

EXAM:
CT ABDOMEN AND PELVIS WITH CONTRAST
TECHNIQUE: Multidetector CT imaging of the abdomen and pelvis was performed
using the standard protocol following bolus administration of
intravenous contrast.
CONTRAST:  80mL OMNIPAQUE IOHEXOL 300 MG/ML  SOLN

[Series 3: abd/ pelvis 5.0 i30f 2 · axial · 0.90mm/px · z∈[+942,+1366]mm · 13 of 95 slices shown, 15 images]
[im 5/95  soft-tissue]
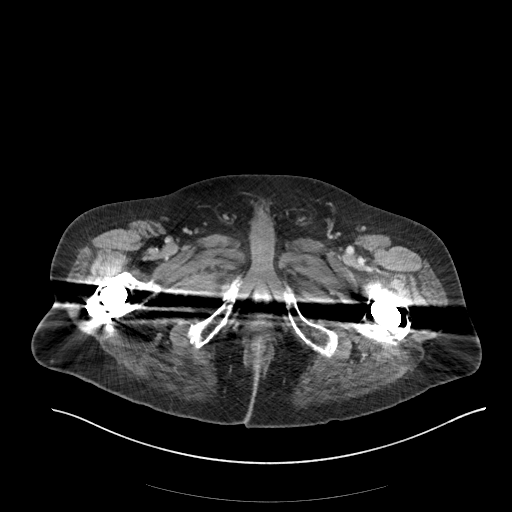
[im 5/95  bone]
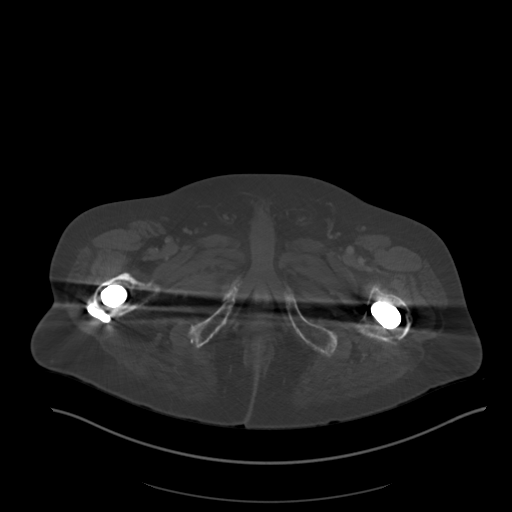
[im 15/95  soft-tissue]
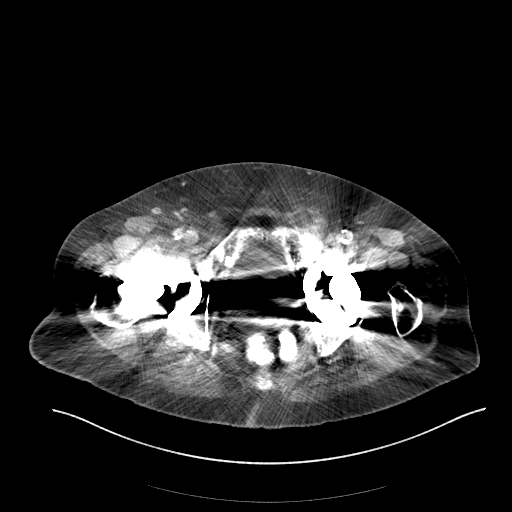
[im 20/95  soft-tissue]
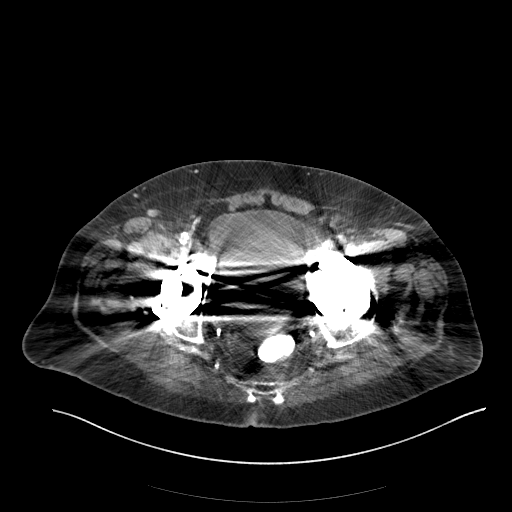
[im 25/95  soft-tissue]
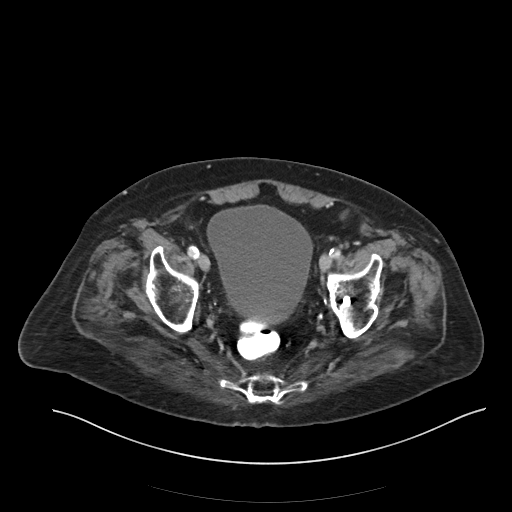
[im 35/95  soft-tissue]
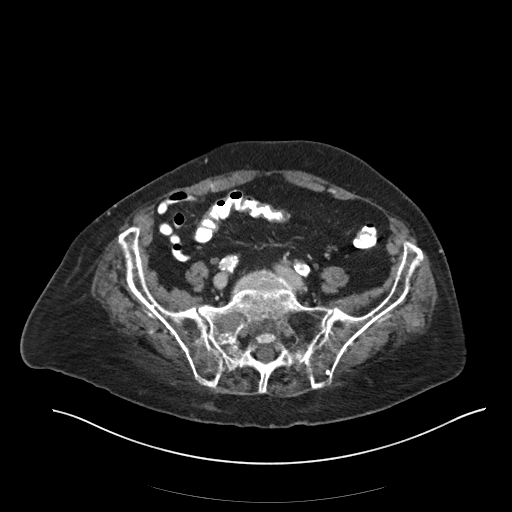
[im 40/95  soft-tissue]
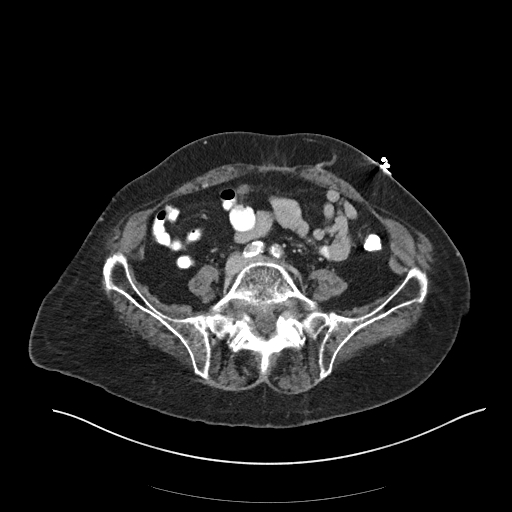
[im 50/95  soft-tissue]
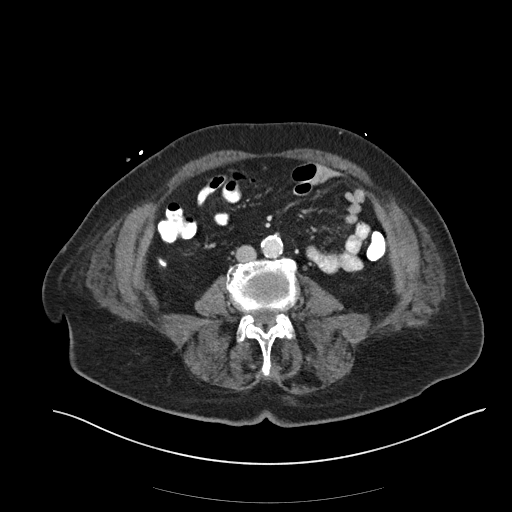
[im 55/95  soft-tissue]
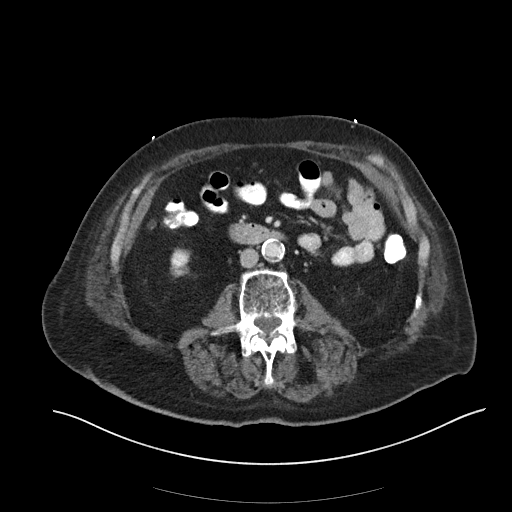
[im 60/95  soft-tissue]
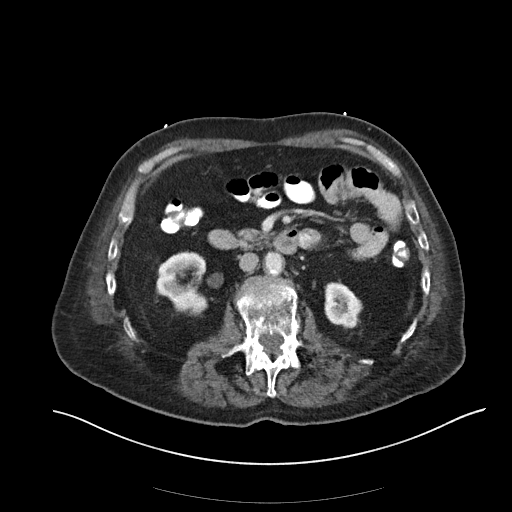
[im 60/95  bone]
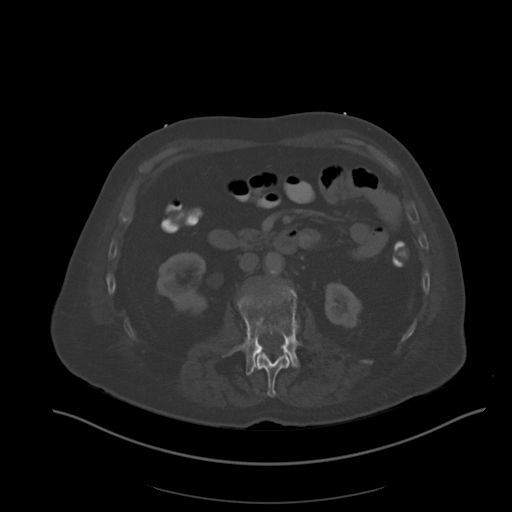
[im 70/95  soft-tissue]
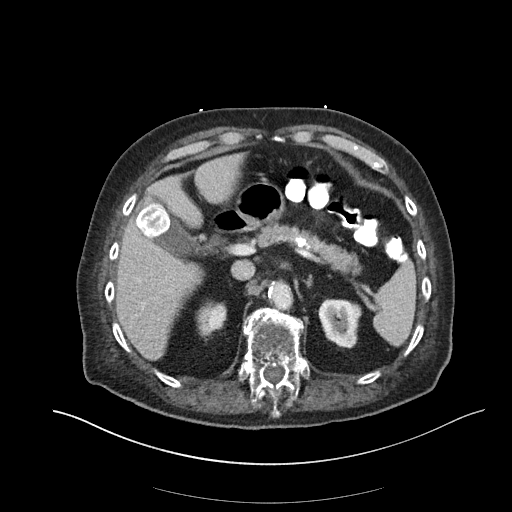
[im 75/95  soft-tissue]
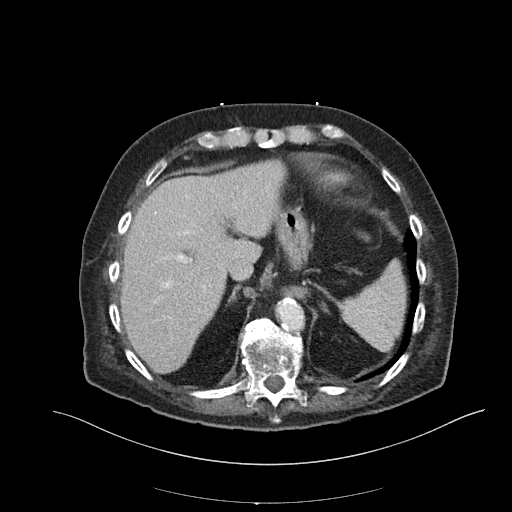
[im 80/95  soft-tissue]
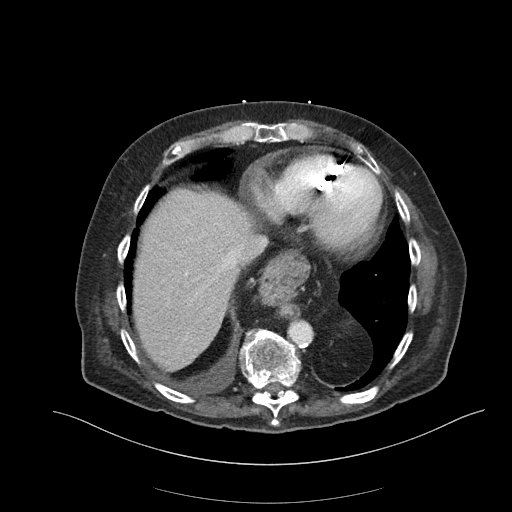
[im 90/95  soft-tissue]
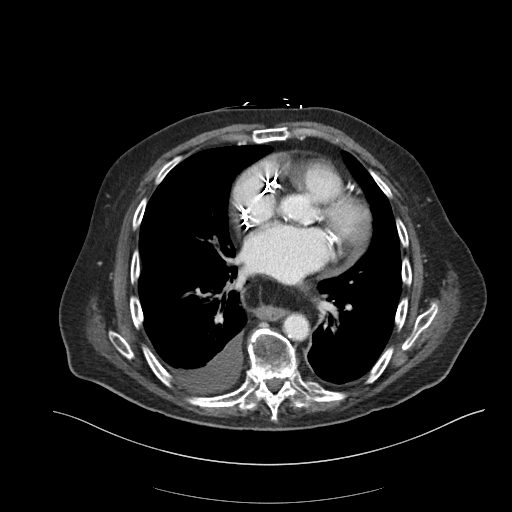

[Series 6: coronal soft tissue · coronal · 0.75mm/px · 3 of 100 slices shown]
[im 34/100  soft-tissue]
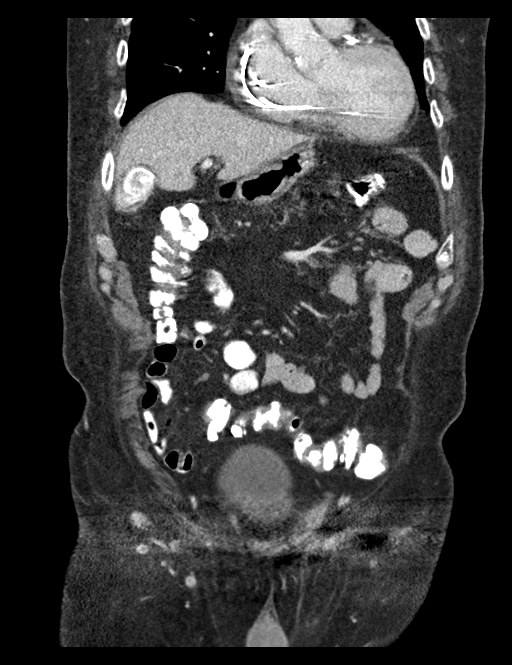
[im 45/100  soft-tissue]
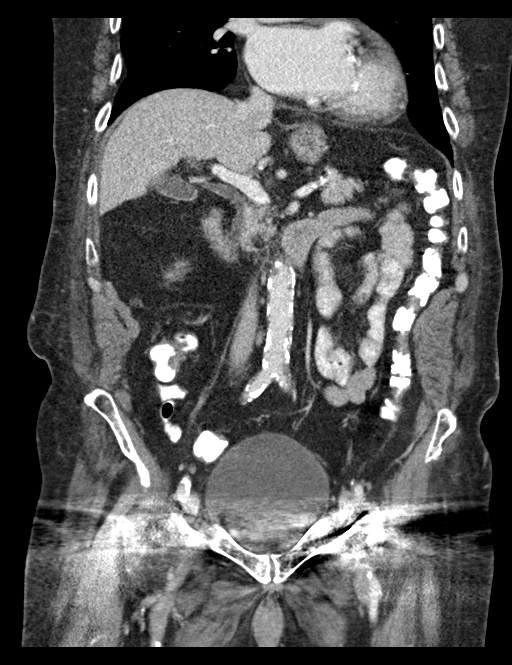
[im 56/100  soft-tissue]
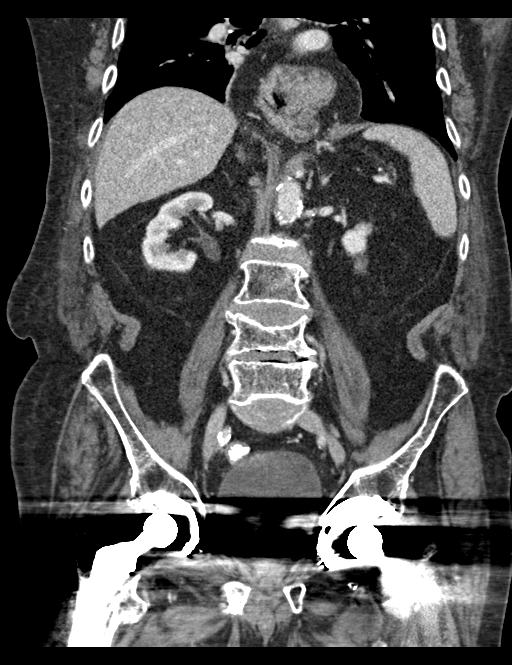

[16 of 46 positions shown; findings below may reference images not displayed]

FINDINGS: Lower Chest: Small right pleural effusion and mild dependent
atelectasis. Stable small left posterior diaphragmatic hernia
containing only fat.

Hepatobiliary: No hepatic masses identified. Large calcified
gallstone again seen gallbladder fundus, however there is no
evidence of cholecystitis or biliary ductal dilatation.

Pancreas:  No mass or inflammatory changes.

Spleen: Within normal limits in size and appearance.

Adrenals/Urinary Tract: No masses identified. No evidence of
hydronephrosis.

Stomach/Bowel: Stable large hiatal hernia containing majority of
stomach. No evidence of obstruction, inflammatory process or
abnormal fluid collections. Normal appendix visualized.

Vascular/Lymphatic: No pathologically enlarged lymph nodes. No
abdominal aortic aneurysm. Aortic atherosclerosis.

Reproductive: Obscured by extensive beam hardening artifact from
bilateral hip prostheses.

Other:  None.

Musculoskeletal:  No suspicious bone lesions identified.
IMPRESSION: No evidence of neoplasm or other acute findings within the abdomen
or pelvis.

Stable large hiatal hernia containing majority of stomach, and small
left posterior diaphragmatic hernia containing only fat.

Cholelithiasis. No radiographic evidence of cholecystitis.

Small right pleural effusion.

## 2020-10-10 MED ORDER — HYDROCODONE-ACETAMINOPHEN 5-325 MG PO TABS: 1.0000 | ORAL_TABLET | Freq: Two times a day (BID) | ORAL | 0 refills | Status: DC | PRN

## 2020-10-13 ENCOUNTER — Other Ambulatory Visit: Payer: Self-pay | Admitting: Cardiology

## 2020-10-14 ENCOUNTER — Other Ambulatory Visit: Payer: Self-pay

## 2020-10-14 MED ORDER — FLOVENT HFA 110 MCG/ACT IN AERO: 2.0000 | INHALATION_SPRAY | Freq: Two times a day (BID) | RESPIRATORY_TRACT | 6 refills | Status: DC

## 2020-10-14 NOTE — Telephone Encounter (Signed)
Refill sent to pharmacy.   

## 2020-10-15 ENCOUNTER — Other Ambulatory Visit: Payer: Self-pay

## 2020-10-15 MED ORDER — FLOVENT HFA 110 MCG/ACT IN AERO: 2.0000 | INHALATION_SPRAY | Freq: Two times a day (BID) | RESPIRATORY_TRACT | 6 refills | Status: DC

## 2020-10-27 ENCOUNTER — Other Ambulatory Visit: Payer: Self-pay | Admitting: Cardiology

## 2020-10-27 DIAGNOSIS — I2510 Atherosclerotic heart disease of native coronary artery without angina pectoris: Secondary | ICD-10-CM

## 2020-11-05 ENCOUNTER — Other Ambulatory Visit: Payer: Self-pay

## 2020-11-05 DIAGNOSIS — I6930 Unspecified sequelae of cerebral infarction: Secondary | ICD-10-CM

## 2020-11-05 MED ORDER — HYDROCODONE-ACETAMINOPHEN 5-325 MG PO TABS: 1.0000 | ORAL_TABLET | Freq: Two times a day (BID) | ORAL | 0 refills | Status: DC | PRN

## 2020-11-18 ENCOUNTER — Other Ambulatory Visit: Payer: Self-pay | Admitting: Legal Medicine

## 2020-11-18 ENCOUNTER — Ambulatory Visit (INDEPENDENT_AMBULATORY_CARE_PROVIDER_SITE_OTHER): Payer: PPO

## 2020-11-18 DIAGNOSIS — I442 Atrioventricular block, complete: Secondary | ICD-10-CM

## 2020-11-19 LAB — CUP PACEART REMOTE DEVICE CHECK
Battery Impedance: 1274 Ohm
Battery Remaining Longevity: 31 mo
Battery Voltage: 2.75 V
Brady Statistic AP VP Percent: 17 %
Brady Statistic AP VS Percent: 0 %
Brady Statistic AS VP Percent: 83 %
Brady Statistic AS VS Percent: 0 %
Date Time Interrogation Session: 20220601145402
Implantable Lead Implant Date: 20150319
Implantable Lead Implant Date: 20150319
Implantable Lead Location: 753859
Implantable Lead Location: 753860
Implantable Lead Model: 4092
Implantable Lead Model: 5076
Implantable Pulse Generator Implant Date: 20150319
Lead Channel Impedance Value: 398 Ohm
Lead Channel Impedance Value: 573 Ohm
Lead Channel Pacing Threshold Amplitude: 0.875 V
Lead Channel Pacing Threshold Amplitude: 2.5 V
Lead Channel Pacing Threshold Pulse Width: 0.4 ms
Lead Channel Pacing Threshold Pulse Width: 0.4 ms
Lead Channel Setting Pacing Amplitude: 1.75 V
Lead Channel Setting Pacing Amplitude: 5 V
Lead Channel Setting Pacing Pulse Width: 0.4 ms
Lead Channel Setting Sensing Sensitivity: 2 mV

## 2020-11-25 ENCOUNTER — Telehealth: Payer: Self-pay

## 2020-11-25 NOTE — Progress Notes (Signed)
    Chronic Care Management Pharmacy Assistant   Name: [name]  MRN: [id number] DOB: January 24, 1935  Reason for Encounter: Disease State for general adherence  Recent office visits:  11/18/20-cardiology, remote pacemaker check  10/08/20-[name], [place], Patient is to bring in proof of expense once eligible for Eliquis and Trelegy Patient assistance. Patient needs to have spent 3% of income on out of pocket for Eliquis and $600 for Trelegy. Wife acknowledges understanding.  Discussed healthy diet and activity to improve cholesterol management. Recommend considering atorvastatin 80 mg daily at next labs if elevated above goal.   08/27/20- cardiology, [facility] [place]  08/19/20- Patient declined urology appointment/referral  Recent consult visits:  none  Hospital visits:  None in previous 6 months  Medications: Outpatient Encounter Medications as of 11/25/2020  Medication Sig  . albuterol (VENTOLIN HFA) 108 (90 Base) MCG/ACT inhaler INHALE 2 PUFFS BY MOUTH EVERY 6 HOURS AS NEEDED  . amLODipine (NORVASC) 5 MG tablet TAKE 1 TABLET(5 MG) BY MOUTH AT BEDTIME  . apixaban (ELIQUIS) 5 MG TABS tablet Take 1 tablet (5 mg total) by mouth 2 (two) times daily.  Marland Kitchen atorvastatin (LIPITOR) 40 MG tablet TAKE 1 TABLET BY MOUTH DAILY  . FEROSUL 325 (65 Fe) MG tablet TAKE 1 TABLET(325 MG) BY MOUTH TWICE DAILY (Patient taking differently: Take 325 mg by mouth daily.)  . FLOVENT HFA 110 MCG/ACT inhaler INHALE 2 PUFFS BY MOUTH TWICE DAILY  . HYDROcodone-acetaminophen (NORCO/VICODIN) 5-325 MG tablet Take 1 tablet by mouth 2 (two) times daily as needed (pain).  . isosorbide mononitrate (IMDUR) 60 MG 24 hr tablet TAKE 1 TABLET(60 MG) BY MOUTH DAILY  . levothyroxine (SYNTHROID) 50 MCG tablet TAKE 1 TABLET BY MOUTH DAILY  . Multiple Vitamin (MULTIVITAMIN WITH MINERALS) TABS tablet Take 1 tablet by mouth daily.  . nitroGLYCERIN (NITROSTAT) 0.4 MG SL tablet DISSOLVE 1 TABLET UNDER THE  TONGUE EVERY 5 MINUTES AS NEEDED FOR CHEST PAIN. MAXIMUM OF 3 DOSES IN 15 MINUTES  . omeprazole (PRILOSEC) 20 MG capsule Take 20 mg by mouth daily.  . ranolazine (RANEXA) 500 MG 12 hr tablet TAKE 1 TABLET(500 MG) BY MOUTH TWICE DAILY  . vitamin B-12 (CYANOCOBALAMIN) 1000 MCG tablet Take 1,000 mcg by mouth daily.    No facility-administered encounter medications on file as of 11/25/2020.   Patient stated he is doing well, he denies any issues with is medications.  He is taking his medication as directed.  He confirmed he had Atorvastatin from previous fills.  Patient stated he really doesn't watch his diet, he eats what he wants to.  Patient stated he is very active.  He stated he does take his blood pressure, he stated it stays around 160-170/69, he stated he has no symptoms when it is in the 170's.     Star Rating Drugs: Atorvastatin  06/19/20 90([facility] confirmed) Patient had some from previous  Amlodipine 10/14/20 90 Eliquis  11/05/20 30ds Isosorbide 10/28/20 90ds Levothyroxine 10/13/20 [place], [place] Pharmacist Assistant [phone]

## 2020-11-26 NOTE — Progress Notes (Signed)
Called [facility] to get new print out for PAP application purposes.  They are going to fax it to the office  Donette Larry, CPP notified   Clarita Leber, [place] Pharmacist Assistant [phone]

## 2020-12-02 NOTE — Progress Notes (Signed)
12/02/20-called to check status of Trelegy and Eliquis PAP applications   Trelegy- They need signature on script, they actually have one for Flovent per representative, but its not signed.  They also need updated signature page from patient and expense report.   Eliquis- Per representative the patient was denied on Oct 28 2020, he still needs to spent $212.36 out of pocket to qualify for assistance   Clarita Leber, [place] Pharmacist Assistant [phone]

## 2020-12-03 ENCOUNTER — Other Ambulatory Visit: Payer: Self-pay

## 2020-12-03 DIAGNOSIS — I6930 Unspecified sequelae of cerebral infarction: Secondary | ICD-10-CM

## 2020-12-03 MED ORDER — HYDROCODONE-ACETAMINOPHEN 5-325 MG PO TABS: 1.0000 | ORAL_TABLET | Freq: Two times a day (BID) | ORAL | 0 refills | Status: DC | PRN

## 2020-12-10 NOTE — Progress Notes (Signed)
Remote pacemaker transmission.   

## 2020-12-16 ENCOUNTER — Other Ambulatory Visit: Payer: Self-pay

## 2020-12-16 MED ORDER — FLUTICASONE PROPIONATE HFA 110 MCG/ACT IN AERO: 2.0000 | INHALATION_SPRAY | Freq: Two times a day (BID) | RESPIRATORY_TRACT | 6 refills | Status: AC

## 2020-12-19 ENCOUNTER — Telehealth: Payer: Self-pay

## 2020-12-19 NOTE — Progress Notes (Signed)
    Chronic Care Management Pharmacy Assistant   Name: [name]  MRN: [id number] DOB: February 20, 1935  Reason for Encounter: Medication Review for status of Eliquis and Flovent PAPs    Medications: Outpatient Encounter Medications as of 12/19/2020  Medication Sig   albuterol (VENTOLIN HFA) 108 (90 Base) MCG/ACT inhaler INHALE 2 PUFFS BY MOUTH EVERY 6 HOURS AS NEEDED   amLODipine (NORVASC) 5 MG tablet TAKE 1 TABLET(5 MG) BY MOUTH AT BEDTIME   apixaban (ELIQUIS) 5 MG TABS tablet Take 1 tablet (5 mg total) by mouth 2 (two) times daily.   atorvastatin (LIPITOR) 40 MG tablet TAKE 1 TABLET BY MOUTH DAILY   FEROSUL 325 (65 Fe) MG tablet TAKE 1 TABLET(325 MG) BY MOUTH TWICE DAILY (Patient taking differently: Take 325 mg by mouth daily.)   fluticasone (FLOVENT HFA) 110 MCG/ACT inhaler Inhale 2 puffs into the lungs 2 (two) times daily.   HYDROcodone-acetaminophen (NORCO/VICODIN) 5-325 MG tablet Take 1 tablet by mouth 2 (two) times daily as needed (pain).   isosorbide mononitrate (IMDUR) 60 MG 24 hr tablet TAKE 1 TABLET(60 MG) BY MOUTH DAILY   levothyroxine (SYNTHROID) 50 MCG tablet TAKE 1 TABLET BY MOUTH DAILY   Multiple Vitamin (MULTIVITAMIN WITH MINERALS) TABS tablet Take 1 tablet by mouth daily.   nitroGLYCERIN (NITROSTAT) 0.4 MG SL tablet DISSOLVE 1 TABLET UNDER THE TONGUE EVERY 5 MINUTES AS NEEDED FOR CHEST PAIN. MAXIMUM OF 3 DOSES IN 15 MINUTES   omeprazole (PRILOSEC) 20 MG capsule Take 20 mg by mouth daily.   ranolazine (RANEXA) 500 MG 12 hr tablet TAKE 1 TABLET(500 MG) BY MOUTH TWICE DAILY   vitamin B-12 (CYANOCOBALAMIN) 1000 MCG tablet Take 1,000 mcg by mouth daily.    No facility-administered encounter medications on file as of 12/19/2020.      Called Eliquis to check the status of his application, representative stated it was denied to not meeting out of pocket requirement.  He has $212.36 left to meet.  Called Flovent to check application status, representative stated he needs to  spend 600.00 out of pocket to qualify, they need a signed script and stated the printout from the pharmacy needs to be signed and dated by the pharmacist, they also need copies of his insurance cards.   I have called his pharmacy to have a new print out sent to Donette Larry, CPP.     Clarita Leber, [place] Pharmacist Assistant [phone]

## 2020-12-25 ENCOUNTER — Other Ambulatory Visit: Payer: Self-pay | Admitting: Legal Medicine

## 2020-12-25 ENCOUNTER — Other Ambulatory Visit: Payer: Self-pay | Admitting: Cardiology

## 2020-12-29 ENCOUNTER — Other Ambulatory Visit: Payer: Self-pay

## 2020-12-29 DIAGNOSIS — I6930 Unspecified sequelae of cerebral infarction: Secondary | ICD-10-CM

## 2020-12-29 MED ORDER — HYDROCODONE-ACETAMINOPHEN 5-325 MG PO TABS: 1.0000 | ORAL_TABLET | Freq: Two times a day (BID) | ORAL | 0 refills | Status: DC | PRN

## 2020-12-29 NOTE — Progress Notes (Signed)
Contacted patient to inform him that patient assistance for Eliquis has been approved. We are still waiting to hear back about his inhaler. Informed patient to look out for phone call from Eliquis manufacturer to schedule delivery.    Sherre Poot, CPP notified  Margaretmary Dys, [place] Pharmacy Assistant [phone]

## 2021-01-01 NOTE — Progress Notes (Signed)
01/01/21-called [employer] to check on Flovent PAP application   Spoke to representative, she stated the out of pocket report had to be signed by the pharmacist, and the prescription has to be sent with an office fax cover sheet before it can be processed.  I have notified Donette Larry, [place], [place] Pharmacist Assistant [phone]

## 2021-01-18 ENCOUNTER — Other Ambulatory Visit: Payer: Self-pay | Admitting: Legal Medicine

## 2021-01-26 ENCOUNTER — Other Ambulatory Visit: Payer: Self-pay

## 2021-01-26 DIAGNOSIS — I6930 Unspecified sequelae of cerebral infarction: Secondary | ICD-10-CM

## 2021-01-26 MED ORDER — HYDROCODONE-ACETAMINOPHEN 5-325 MG PO TABS: 1.0000 | ORAL_TABLET | Freq: Two times a day (BID) | ORAL | 0 refills | Status: DC | PRN

## 2021-01-30 ENCOUNTER — Emergency Department (HOSPITAL_COMMUNITY): Payer: PPO

## 2021-01-30 ENCOUNTER — Telehealth: Payer: Self-pay

## 2021-01-30 ENCOUNTER — Other Ambulatory Visit: Payer: Self-pay

## 2021-01-30 ENCOUNTER — Inpatient Hospital Stay (HOSPITAL_COMMUNITY)
Admission: EM | Admit: 2021-01-30 | Discharge: 2021-02-05 | DRG: 246 | Disposition: A | Discharge status: Home / Self Care | Payer: PPO | Attending: Cardiology | Admitting: Cardiology

## 2021-01-30 ENCOUNTER — Telehealth: Payer: Self-pay | Admitting: Cardiology

## 2021-01-30 ENCOUNTER — Encounter (HOSPITAL_COMMUNITY): Payer: Self-pay | Admitting: Emergency Medicine

## 2021-01-30 DIAGNOSIS — Z950 Presence of cardiac pacemaker: Secondary | ICD-10-CM | POA: Diagnosis not present

## 2021-01-30 DIAGNOSIS — I6930 Unspecified sequelae of cerebral infarction: Secondary | ICD-10-CM

## 2021-01-30 DIAGNOSIS — I214 Non-ST elevation (NSTEMI) myocardial infarction: Principal | ICD-10-CM | POA: Diagnosis present

## 2021-01-30 DIAGNOSIS — J90 Pleural effusion, not elsewhere classified: Secondary | ICD-10-CM | POA: Diagnosis not present

## 2021-01-30 DIAGNOSIS — Z7901 Long term (current) use of anticoagulants: Secondary | ICD-10-CM | POA: Diagnosis not present

## 2021-01-30 DIAGNOSIS — I25119 Atherosclerotic heart disease of native coronary artery with unspecified angina pectoris: Secondary | ICD-10-CM | POA: Diagnosis present

## 2021-01-30 DIAGNOSIS — I249 Acute ischemic heart disease, unspecified: Secondary | ICD-10-CM | POA: Diagnosis present

## 2021-01-30 DIAGNOSIS — R635 Abnormal weight gain: Secondary | ICD-10-CM

## 2021-01-30 DIAGNOSIS — J9811 Atelectasis: Secondary | ICD-10-CM | POA: Diagnosis not present

## 2021-01-30 DIAGNOSIS — F17220 Nicotine dependence, chewing tobacco, uncomplicated: Secondary | ICD-10-CM | POA: Diagnosis present

## 2021-01-30 DIAGNOSIS — I5023 Acute on chronic systolic (congestive) heart failure: Secondary | ICD-10-CM

## 2021-01-30 DIAGNOSIS — I350 Nonrheumatic aortic (valve) stenosis: Secondary | ICD-10-CM

## 2021-01-30 DIAGNOSIS — R0600 Dyspnea, unspecified: Secondary | ICD-10-CM

## 2021-01-30 DIAGNOSIS — I509 Heart failure, unspecified: Secondary | ICD-10-CM

## 2021-01-30 DIAGNOSIS — I5032 Chronic diastolic (congestive) heart failure: Secondary | ICD-10-CM | POA: Diagnosis not present

## 2021-01-30 DIAGNOSIS — N183 Chronic kidney disease, stage 3 unspecified: Secondary | ICD-10-CM | POA: Diagnosis present

## 2021-01-30 DIAGNOSIS — I459 Conduction disorder, unspecified: Secondary | ICD-10-CM | POA: Diagnosis present

## 2021-01-30 DIAGNOSIS — Z79890 Hormone replacement therapy (postmenopausal): Secondary | ICD-10-CM

## 2021-01-30 DIAGNOSIS — I272 Pulmonary hypertension, unspecified: Secondary | ICD-10-CM | POA: Diagnosis present

## 2021-01-30 DIAGNOSIS — I255 Ischemic cardiomyopathy: Secondary | ICD-10-CM | POA: Diagnosis present

## 2021-01-30 DIAGNOSIS — I130 Hypertensive heart and chronic kidney disease with heart failure and stage 1 through stage 4 chronic kidney disease, or unspecified chronic kidney disease: Secondary | ICD-10-CM | POA: Diagnosis present

## 2021-01-30 DIAGNOSIS — D509 Iron deficiency anemia, unspecified: Secondary | ICD-10-CM | POA: Diagnosis present

## 2021-01-30 DIAGNOSIS — J45909 Unspecified asthma, uncomplicated: Secondary | ICD-10-CM | POA: Diagnosis present

## 2021-01-30 DIAGNOSIS — K219 Gastro-esophageal reflux disease without esophagitis: Secondary | ICD-10-CM | POA: Diagnosis present

## 2021-01-30 DIAGNOSIS — I442 Atrioventricular block, complete: Secondary | ICD-10-CM | POA: Diagnosis present

## 2021-01-30 DIAGNOSIS — Z20822 Contact with and (suspected) exposure to covid-19: Secondary | ICD-10-CM | POA: Diagnosis present

## 2021-01-30 DIAGNOSIS — N1832 Chronic kidney disease, stage 3b: Secondary | ICD-10-CM | POA: Diagnosis present

## 2021-01-30 DIAGNOSIS — Z96641 Presence of right artificial hip joint: Secondary | ICD-10-CM | POA: Diagnosis present

## 2021-01-30 DIAGNOSIS — E782 Mixed hyperlipidemia: Secondary | ICD-10-CM | POA: Diagnosis present

## 2021-01-30 DIAGNOSIS — I5043 Acute on chronic combined systolic (congestive) and diastolic (congestive) heart failure: Secondary | ICD-10-CM | POA: Diagnosis present

## 2021-01-30 DIAGNOSIS — Z8249 Family history of ischemic heart disease and other diseases of the circulatory system: Secondary | ICD-10-CM

## 2021-01-30 DIAGNOSIS — R079 Chest pain, unspecified: Secondary | ICD-10-CM

## 2021-01-30 DIAGNOSIS — I429 Cardiomyopathy, unspecified: Secondary | ICD-10-CM | POA: Diagnosis not present

## 2021-01-30 DIAGNOSIS — Z86010 Personal history of colonic polyps: Secondary | ICD-10-CM | POA: Diagnosis not present

## 2021-01-30 DIAGNOSIS — I2510 Atherosclerotic heart disease of native coronary artery without angina pectoris: Secondary | ICD-10-CM | POA: Diagnosis not present

## 2021-01-30 DIAGNOSIS — I25110 Atherosclerotic heart disease of native coronary artery with unstable angina pectoris: Secondary | ICD-10-CM | POA: Diagnosis not present

## 2021-01-30 DIAGNOSIS — I517 Cardiomegaly: Secondary | ICD-10-CM | POA: Diagnosis not present

## 2021-01-30 DIAGNOSIS — I480 Paroxysmal atrial fibrillation: Secondary | ICD-10-CM | POA: Diagnosis present

## 2021-01-30 DIAGNOSIS — Z8546 Personal history of malignant neoplasm of prostate: Secondary | ICD-10-CM

## 2021-01-30 DIAGNOSIS — E039 Hypothyroidism, unspecified: Secondary | ICD-10-CM | POA: Diagnosis present

## 2021-01-30 DIAGNOSIS — I739 Peripheral vascular disease, unspecified: Secondary | ICD-10-CM | POA: Diagnosis present

## 2021-01-30 DIAGNOSIS — Z955 Presence of coronary angioplasty implant and graft: Secondary | ICD-10-CM

## 2021-01-30 DIAGNOSIS — R0902 Hypoxemia: Secondary | ICD-10-CM | POA: Diagnosis not present

## 2021-01-30 DIAGNOSIS — I10 Essential (primary) hypertension: Secondary | ICD-10-CM | POA: Diagnosis not present

## 2021-01-30 DIAGNOSIS — R0789 Other chest pain: Secondary | ICD-10-CM | POA: Diagnosis not present

## 2021-01-30 DIAGNOSIS — R0602 Shortness of breath: Secondary | ICD-10-CM | POA: Diagnosis not present

## 2021-01-30 DIAGNOSIS — Z79899 Other long term (current) drug therapy: Secondary | ICD-10-CM

## 2021-01-30 LAB — CBC WITH DIFFERENTIAL/PLATELET
Abs Immature Granulocytes: 0.04 10*3/uL (ref 0.00–0.07)
Basophils Absolute: 0 10*3/uL (ref 0.0–0.1)
Basophils Relative: 0 %
Eosinophils Absolute: 0.1 10*3/uL (ref 0.0–0.5)
Eosinophils Relative: 1 %
HCT: 34.6 % — ABNORMAL LOW (ref 39.0–52.0)
Hemoglobin: 11.5 g/dL — ABNORMAL LOW (ref 13.0–17.0)
Immature Granulocytes: 0 %
Lymphocytes Relative: 8 %
Lymphs Abs: 0.9 10*3/uL (ref 0.7–4.0)
MCH: 31.1 pg (ref 26.0–34.0)
MCHC: 33.2 g/dL (ref 30.0–36.0)
MCV: 93.5 fL (ref 80.0–100.0)
Monocytes Absolute: 1.3 10*3/uL — ABNORMAL HIGH (ref 0.1–1.0)
Monocytes Relative: 12 %
Neutro Abs: 8 10*3/uL — ABNORMAL HIGH (ref 1.7–7.7)
Neutrophils Relative %: 79 %
Platelets: 193 10*3/uL (ref 150–400)
RBC: 3.7 MIL/uL — ABNORMAL LOW (ref 4.22–5.81)
RDW: 12.6 % (ref 11.5–15.5)
WBC: 10.3 10*3/uL (ref 4.0–10.5)
nRBC: 0 % (ref 0.0–0.2)

## 2021-01-30 LAB — COMPREHENSIVE METABOLIC PANEL
ALT: 17 U/L (ref 0–44)
AST: 30 U/L (ref 15–41)
Albumin: 3.2 g/dL — ABNORMAL LOW (ref 3.5–5.0)
Alkaline Phosphatase: 62 U/L (ref 38–126)
Anion gap: 11 (ref 5–15)
BUN: 13 mg/dL (ref 8–23)
CO2: 18 mmol/L — ABNORMAL LOW (ref 22–32)
Calcium: 7.9 mg/dL — ABNORMAL LOW (ref 8.9–10.3)
Chloride: 99 mmol/L (ref 98–111)
Creatinine, Ser: 1.3 mg/dL — ABNORMAL HIGH (ref 0.61–1.24)
GFR, Estimated: 55 mL/min — ABNORMAL LOW (ref >60)
Glucose, Bld: 115 mg/dL — ABNORMAL HIGH (ref 70–99)
Potassium: 3.6 mmol/L (ref 3.5–5.1)
Sodium: 128 mmol/L — ABNORMAL LOW (ref 135–145)
Total Bilirubin: 0.8 mg/dL (ref 0.3–1.2)
Total Protein: 6.2 g/dL — ABNORMAL LOW (ref 6.5–8.1)

## 2021-01-30 LAB — RESP PANEL BY RT-PCR (FLU A&B, COVID) ARPGX2
Influenza A by PCR: NEGATIVE
Influenza B by PCR: NEGATIVE
SARS Coronavirus 2 by RT PCR: NEGATIVE

## 2021-01-30 LAB — TROPONIN I (HIGH SENSITIVITY): Troponin I (High Sensitivity): 1689 ng/L (ref <18)

## 2021-01-30 LAB — BRAIN NATRIURETIC PEPTIDE: B Natriuretic Peptide: 735.1 pg/mL — ABNORMAL HIGH (ref 0.0–100.0)

## 2021-01-30 MED ORDER — HEPARIN (PORCINE) 25000 UT/250ML-% IV SOLN
1000.0000 [IU]/h | INTRAVENOUS | Status: DC
  Administered 2021-01-30 – 2021-02-02 (×4): 1000 [IU]/h via INTRAVENOUS
  Filled 2021-01-30 (×3): qty 250

## 2021-01-30 MED ORDER — FUROSEMIDE 10 MG/ML IJ SOLN
40.0000 mg | Freq: Once | INTRAMUSCULAR | Status: AC
  Administered 2021-01-30: 40 mg via INTRAVENOUS
  Filled 2021-01-30: qty 4

## 2021-01-30 MED ORDER — NITROGLYCERIN IN D5W 200-5 MCG/ML-% IV SOLN
0.0000 ug/min | INTRAVENOUS | Status: DC
  Administered 2021-01-30: 5 ug/min via INTRAVENOUS
  Administered 2021-01-31: 7 ug/min via INTRAVENOUS
  Administered 2021-02-01 – 2021-02-03 (×2): 15 ug/min via INTRAVENOUS
  Filled 2021-01-30 (×2): qty 250

## 2021-01-30 MED ORDER — ALBUTEROL SULFATE (2.5 MG/3ML) 0.083% IN NEBU
5.0000 mg | INHALATION_SOLUTION | Freq: Once | RESPIRATORY_TRACT | Status: AC
  Administered 2021-01-30: 5 mg via RESPIRATORY_TRACT
  Filled 2021-01-30: qty 6

## 2021-01-30 MED ORDER — IPRATROPIUM BROMIDE 0.02 % IN SOLN
0.5000 mg | Freq: Once | RESPIRATORY_TRACT | Status: AC
  Administered 2021-01-30: 0.5 mg via RESPIRATORY_TRACT
  Filled 2021-01-30: qty 2.5

## 2021-01-30 NOTE — Telephone Encounter (Signed)
Spoke to the patient just now and the patients wife. They tell me that he can not move at all without having severe chest pain. He has taken a total of what they believe to be 25 nitroglycerin tablets at this time. I advised that he needed to go the the emergency room as soon as possible but the wife is unhappy with this answer and states "You guys just don't care about him". I tried explaining that the hospital would be best as they could run the appropriate labs and tests. However, the wife is very unhappy.

## 2021-01-30 NOTE — ED Provider Notes (Signed)
[facility] EMERGENCY DEPARTMENT Provider Note   CSN: [id number] Arrival date & time: 01/30/21  2031     History Chief Complaint  Patient presents with   Chest Pain    [name] is a 86 y.o. male.  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  Patient reports all week he has been having chest pain.  Chest pain was severe on Tuesday with exertion and he described it as a squeezing tight and sharp sensation at the bottom of his chest bilaterally that did not radiate but made him short of breath.  He sat down and rested and took some nitroglycerin and reports that it eventually went away.  However since Tuesday every day he has been having more frequent episodes of chest pain.  They typically improve with rest but always recur with any type of exertion and walking.  Today however he has had pain throughout the day that only is relieved for a few minutes with his nitroglycerin.  He also has been short of breath all day that is been gradually worsening.  He has had a mild cough and has tried to use in his inhalers but they are not working either.  He has not noticed any significant swelling in his feet.  He has had a mild cough but its been nonproductive.  No fever, abdominal pain, nausea vomiting or diarrhea.  Patient currently has pain that is 5 out of 10 and in the lower chest.  It does not seem worse with taking a deep breath.  It is not related to eating.  He denies any recent medication changes.  He does not check his weight regularly but normally reports that he can do a fair amount of walking and feels fine and today he could not walk across the room without feeling short of breath.  The history is provided by the patient.  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 moderate nonobstructive CAD by cath in 2018.   Coronary atherosclerosis of native coronary artery 04/09/2019   Mid RCA lesion, 60 %stenosed. Prox LAD lesion, 40 %stenosed. Prox LAD to Mid LAD lesion, 50 %stenosed. LPDA lesion, 60 %stenosed.   Disorder of carotid artery ([place]) 12/29/2016   Dyslipidemia 12/29/2016   Essential hypertension, benign 08/15/2017   Exertional angina ([place]) 02/08/2017   Gastritis and gastroduodenitis    GERD (gastroesophageal reflux disease)    Hypothyroidism    Iron deficiency anemia    Late effect of cerebrovascular accident (CVA) 05/25/2017   Late effects of cerebrovascular disease 05/25/2017   Malignant neoplasm of prostate ([place]) 08/17/2019   Mixed hyperlipidemia 12/14/2019   Obesity 01/25/2018   Osteoarthritis of both knees 08/17/2019   Pacemaker 08/08/2019   PAF (paroxysmal atrial fibrillation) (HCC)    Paroxysmal atrial fibrillation ([place]) 04/01/2015   Peripheral vascular disease ([place])  Polyp of cecum    Precordial pain 04/23/2015   Preinfarction syndrome ([place]) 02/08/2017   Right-sided carotid artery disease ([place]) 12/29/2016   Routine general medical examination at a health care facility 04/01/2015   Sequela of cerebrovascular accident 05/25/2017   Symptomatic anemia 04/12/2019    Patient Active Problem List   Diagnosis Date Noted   GERD (gastroesophageal reflux disease)    Chronic anemia    Asthma    Arthritis    BMI 32.0-32.9,adult 04/17/2020   Mixed hyperlipidemia 12/14/2019   Osteoarthritis of both knees 08/17/2019   Malignant neoplasm of prostate  ([place]) 08/17/2019   Pacemaker 08/08/2019   Symptomatic anemia 04/12/2019   CKD (chronic kidney disease), stage III ([place]) 04/12/2019   Iron deficiency anemia    Gastritis and gastroduodenitis    Coronary atherosclerosis of native coronary artery 04/09/2019   Acute on chronic combined systolic and diastolic CHF (congestive heart failure) ([place]) 04/09/2019   Obesity 01/25/2018   Essential hypertension, benign 08/15/2017   Late effect of cerebrovascular accident (CVA) 05/25/2017   Late effects of cerebrovascular disease 05/25/2017   Sequela of cerebrovascular accident 05/25/2017   Exertional angina ([place]) 02/08/2017   Dyslipidemia 12/29/2016   Right-sided carotid artery disease ([place]) 12/29/2016   Disorder of carotid artery ([place]) 12/29/2016   Peripheral vascular disease ([place]) 04/23/2015   Complete heart block ([place]) 04/01/2015   Hypothyroidism 04/01/2015   Routine general medical examination at a health care facility 04/01/2015   Paroxysmal atrial fibrillation ([place]) 04/01/2015   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    Past Surgical History:  Procedure Laterality Date   BIOPSY  04/11/2019   Procedure: BIOPSY;  Surgeon: Milus Banister, MD;  Location: [facility] ENDOSCOPY;  Service: Endoscopy;;   CATARACT EXTRACTION     COLONOSCOPY WITH PROPOFOL N/A 04/11/2019   Procedure: COLONOSCOPY WITH PROPOFOL;  Surgeon: Milus Banister, MD;  Location[facility] ENDOSCOPY;  Service: Endoscopy;  Laterality: N/A;   ESOPHAGOGASTRODUODENOSCOPY (EGD) WITH PROPOFOL N/A 04/11/2019   Procedure: ESOPHAGOGASTRODUODENOSCOPY (EGD) WITH PROPOFOL;  Surgeon: Milus Banister, MD;  Location: [facility] ENDOSCOPY;  Service: Endoscopy;  Laterality: N/A;   HIP SURGERY     INSERT / REPLACE / REMOVE PACEMAKER     [employer]   JOINT REPLACEMENT Right 10/20/1994   Hip  (Left Hip in 10/96)   LEFT HEART CATH AND CORONARY ANGIOGRAPHY N/A 02/08/2017   Procedure: LEFT HEART CATH AND CORONARY ANGIOGRAPHY;  Surgeon: Martinique, Peter M, MD;   Location: [facility] CV LAB;  Service: Cardiovascular;  Laterality: N/A;   ORIF PERIPROSTHETIC FRACTURE Right 08/17/2017   Procedure: OPEN REDUCTION INTERNAL FIXATION (ORIF) PERIPROSTHETIC FRACTURE RIGHT FEMUR;  Surgeon: Paralee Cancel, MD;  Location: [facility] ORS;  Service: Orthopedics;  Laterality: Right;   ORIF PERIPROSTHETIC FRACTURE Right 01/23/2018   Procedure: Redo open reduction internal fixation right periprosthetic femur fracture;  Surgeon: Paralee Cancel, MD;  Location: [facility] ORS;  Service: Orthopedics;  Laterality: Right;  120 mins   POLYPECTOMY  04/11/2019   Procedure: POLYPECTOMY;  Surgeon: Milus Banister, MD;  Location: [facility] ENDOSCOPY;  Service: Endoscopy;;       Family History  Problem Relation Age of Onset   Heart attack Mother     Social History   Tobacco Use   Smoking status: Never   Smokeless tobacco: Current    Types: Snuff   Tobacco comments:    1 can per day  Vaping Use   Vaping Use: Never used  Substance Use Topics   Alcohol use: Yes  Alcohol/week: 3.0 standard drinks    Types: 3 Cans of beer per week    Comment: regular use 4 times a week   Drug use: No    Home Medications Prior to Admission medications   Medication Sig Start Date End Date Taking? Authorizing Provider  albuterol (VENTOLIN HFA) 108 (90 Base) MCG/ACT inhaler INHALE 2 PUFFS BY MOUTH EVERY 6 HOURS AS NEEDED 06/09/20   Lillard Anes, MD  amLODipine (NORVASC) 5 MG tablet TAKE 1 TABLET(5 MG) BY MOUTH AT BEDTIME 10/14/20   Park Liter, MD  apixaban (ELIQUIS) 5 MG TABS tablet Take 1 tablet (5 mg total) by mouth 2 (two) times daily. 09/05/20   Lillard Anes, MD  atorvastatin (LIPITOR) 40 MG tablet TAKE 1 TABLET BY MOUTH DAILY 12/25/20   Lillard Anes, MD  FEROSUL 325 (65 Fe) MG tablet TAKE 1 TABLET(325 MG) BY MOUTH TWICE DAILY Patient taking differently: Take 325 mg by mouth daily. 04/30/20   Lillard Anes, MD  fluticasone (FLOVENT HFA) 110 MCG/ACT inhaler Inhale  2 puffs into the lungs 2 (two) times daily. 12/16/20   Lillard Anes, MD  HYDROcodone-acetaminophen (NORCO/VICODIN) 5-325 MG tablet Take 1 tablet by mouth 2 (two) times daily as needed (pain). 01/26/21   Lillard Anes, MD  isosorbide mononitrate (IMDUR) 60 MG 24 hr tablet TAKE 1 TABLET(60 MG) BY MOUTH DAILY 10/28/20   Park Liter, MD  levothyroxine (SYNTHROID) 50 MCG tablet TAKE 1 TABLET BY MOUTH DAILY 01/18/21   Lillard Anes, MD  Multiple Vitamin (MULTIVITAMIN WITH MINERALS) TABS tablet Take 1 tablet by mouth daily.    [provider]  nitroGLYCERIN (NITROSTAT) 0.4 MG SL tablet DISSOLVE 1 TABLET UNDER THE TONGUE EVERY 5 MINUTES AS NEEDED FOR CHEST PAIN. MAX OF 3 DOSES IN 15 MINUTES 12/25/20   Park Liter, MD  omeprazole (PRILOSEC) 20 MG capsule Take 20 mg by mouth daily.    [provider]  ranolazine (RANEXA) 500 MG 12 hr tablet TAKE 1 TABLET(500 MG) BY MOUTH TWICE DAILY 05/19/20   Park Liter, MD  vitamin B-12 (CYANOCOBALAMIN) 1000 MCG tablet Take 1,000 mcg by mouth daily.     [provider]    Allergies    Patient has no known allergies.  Review of Systems   Review of Systems  Cardiovascular:  Positive for chest pain.  All other systems reviewed and are negative.  Physical Exam Updated Vital Signs BP (!) 152/69   Pulse 87   Temp 98.8 F (37.1 C) (Oral)   Resp 19   Ht '5\' 2"'$  (1.575 m)   Wt 85.7 kg   SpO2 92%   BMI 34.56 kg/m   Physical Exam Vitals and nursing note reviewed.  Constitutional:      General: He is not in acute distress.    Appearance: He is well-developed. He is ill-appearing.  HENT:     Head: Normocephalic and atraumatic.  Eyes:     Conjunctiva/sclera: Conjunctivae normal.     Pupils: Pupils are equal, round, and reactive to light.  Cardiovascular:     Rate and Rhythm: Regular rhythm. Tachycardia present.     Pulses: Normal pulses.     Heart sounds: No murmur heard. Pulmonary:      Effort: Pulmonary effort is normal. Tachypnea present. No respiratory distress.     Breath sounds: Decreased air movement present. Examination of the right-lower field reveals decreased breath sounds. Examination of the left-lower field reveals decreased breath sounds.  Decreased breath sounds present. No wheezing or rales.  Abdominal:     General: There is no distension.     Palpations: Abdomen is soft.     Tenderness: There is no abdominal tenderness. There is no guarding or rebound.  Musculoskeletal:        General: No tenderness. Normal range of motion.     Cervical back: Normal range of motion and neck supple.     Comments: Trace edema bilateral lower extremities  Skin:    General: Skin is warm and dry.     Findings: No erythema or rash.  Neurological:     Mental Status: He is alert and oriented to person, place, and time. Mental status is at baseline.  Psychiatric:        Mood and Affect: Mood normal.        Behavior: Behavior normal.    ED Results / Procedures / Treatments   Labs (all labs ordered are listed, but only abnormal results are displayed) Labs Reviewed  CBC WITH DIFFERENTIAL/PLATELET - Abnormal; Notable for the following components:      Result Value   RBC 3.70 (*)    Hemoglobin 11.5 (*)    HCT 34.6 (*)    Neutro Abs 8.0 (*)    Monocytes Absolute 1.3 (*)    All other components within normal limits  COMPREHENSIVE METABOLIC PANEL - Abnormal; Notable for the following components:   Sodium 128 (*)    CO2 18 (*)    Glucose, Bld 115 (*)    Creatinine, Ser 1.30 (*)    Calcium 7.9 (*)    Total Protein 6.2 (*)    Albumin 3.2 (*)    GFR, Estimated 55 (*)    All other components within normal limits  BRAIN NATRIURETIC PEPTIDE - Abnormal; Notable for the following components:   B Natriuretic Peptide 735.1 (*)    All other components within normal limits  TROPONIN I (HIGH SENSITIVITY) - Abnormal; Notable for the following components:   Troponin I (High Sensitivity)  1,689 (*)    All other components within normal limits    EKG EKG Interpretation  Date/Time:  Friday January 30 2021 20:32:18 EDT Ventricular Rate:  92 PR Interval:    QRS Duration: 184 QT Interval:  376 QTC Calculation: 466 R Axis:   -50 Text Interpretation: Electronic ventricular pacemaker No significant change since last tracing Confirmed by Blanchie Dessert [phone]) on 01/30/2021 8:43:31 PM  Radiology DG Chest Port 1 View  Result Date: 01/30/2021 CLINICAL DATA:  Left-sided chest pain x1 week. EXAM: PORTABLE CHEST 1 VIEW COMPARISON:  April 09, 2019 FINDINGS: There is a dual lead AICD. Stable, chronic appearing increased lung markings are noted with mild, stable areas of bibasilar atelectasis. There is no evidence of a pleural effusion or pneumothorax. The cardiac silhouette is moderately enlarged and unchanged in size. A chronic deformity is seen involving the mid to distal right clavicle. IMPRESSION: Stable cardiomegaly with mild bibasilar atelectasis. Electronically Signed   By: Virgina Norfolk M.D.   On: 01/30/2021 21:19    Procedures Procedures   Medications Ordered in ED Medications  nitroGLYCERIN 50 mg in dextrose 5 % 250 mL (0.2 mg/mL) infusion (5 mcg/min Intravenous New Bag/Given 01/30/21 2206)  furosemide (LASIX) injection 40 mg (has no administration in time range)  albuterol (PROVENTIL) (2.5 MG/3ML) 0.083% nebulizer solution 5 mg (5 mg Nebulization Given 01/30/21 2055)  ipratropium (ATROVENT) nebulizer solution 0.5 mg (0.5 mg Nebulization Given 01/30/21 2056)    ED  Course  I have reviewed the triage vital signs and the nursing notes.  Pertinent labs & imaging results that were available during my care of the patient were reviewed by me and considered in my medical decision making (see chart for details).    MDM Rules/Calculators/A&P                           Patient is an 87 year old male with significant cardiac history as well as asthma who is presenting today  with chest pain that is been worsening over the week that seems like unstable angina as well as shortness of breath.  Concern for ACS, CHF or possibly asthma exacerbation.  Patient denies any infectious symptoms that would be suggestive of pneumonia or COVID.  He does take Eliquis daily and has not missed any doses and low suspicion for PE at this time.  Patient has been taking nitroglycerin regularly for the last week and it has been effective until today.  Patient was started on a nitro drip with improvement in his pain which is now down to a 1 or 2 out of 10.  He was also given albuterol and Atrovent which he also feels helped a little bit with his shortness of breath.  Labs are consistent with CHF today with CMP of hyponatremia 128, mild AKI with creatinine of 1.30 normal LFTs, BNP elevated today at 735 from baseline of 300s and troponin elevated at 1689.  Most recent echo in 3/22 showed EF of 60-65%. Chest x-ray without acute findings.  CBC within normal limits.  Patient also given IV Lasix.  He was not started on heparin as he has been on Eliquis.  Will discuss with cardiology.  MDM   Amount and/or Complexity of Data Reviewed Clinical lab tests: ordered and reviewed Tests in the radiology section of CPT: ordered and reviewed Tests in the medicine section of CPT: ordered and reviewed Discuss the patient with other providers: yes Independent visualization of images, tracings, or specimens: yes  Risk of Complications, Morbidity, and/or Mortality Presenting problems: high Diagnostic procedures: high Management options: high  Patient Progress Patient progress: improved  CRITICAL CARE Performed by: Cambree Hendrix Total critical care time: 30 minutes Critical care time was exclusive of separately billable procedures and treating other patients. Critical care was necessary to treat or prevent imminent or life-threatening deterioration. Critical care was time spent personally by me on the  following activities: development of treatment plan with patient and/or surrogate as well as nursing, discussions with consultants, evaluation of patient's response to treatment, examination of patient, obtaining history from patient or surrogate, ordering and performing treatments and interventions, ordering and review of laboratory studies, ordering and review of radiographic studies, pulse oximetry and re-evaluation of patient's condition.  Final Clinical Impression(s) / ED Diagnoses Final diagnoses:  ACS (acute coronary syndrome) (HCC)  Acute on chronic congestive heart failure, unspecified heart failure type [facility])    Rx / DC Orders ED Discharge Orders     None        Blanchie Dessert, MD 01/30/21 2223

## 2021-01-30 NOTE — Telephone Encounter (Signed)
Pt c/o of Chest Pain: STAT if CP now or developed within 24 hours  1. Are you having CP right now? no  2. Are you experiencing any other symptoms (ex. SOB, nausea, vomiting, sweating)? SOB  3. How long have you been experiencing CP? All week  4. Is your CP continuous or coming and going? Comes and goes. The wife states he has chest pain whenever he gets up and starts moving around  5. Have you taken Nitroglycerin? Yes. The wife reports that the patient has taken at least 68 ?

## 2021-01-30 NOTE — Telephone Encounter (Signed)
I spoke to the patient wife again to clarify the plan for today. She stated she has never stated the patient was under the impression he was going to see Dr. Agustin Cree and was aware blood work be STAT to obtain results today. Patient was very upset and stated the patient declined coming in to the [place] office and he stated he will take matter in his own hand. I advised the patient he might to reconsider and if he does, I have the orders ready. I advise to please seek care at ED if his sx's worsening. He is aware to call back if needed. Patient verbalized understanding.

## 2021-01-30 NOTE — ED Notes (Signed)
Date and time results received: 01/30/21   Test: trop  Critical Value: 1689  Name of Provider Notified: [place]

## 2021-01-30 NOTE — Telephone Encounter (Signed)
Patient wife call, c/o chest pain skin pale, weight gain 4 lb in a week, can move w/o pain across his chest. Per Dr. Raliegh Ip advise to go to ER for a work up or come in to [facility] location for blood work. She is aware Dr. Raliegh Ip is not in [place]. Ms. Lucita Ferrara decided to have the patient to come by the office for blood work STAT and when results are available Dr. Raliegh Ip will determine the next step. Ms Rothmeyer decline ER visit stating, does not want to wait in the ER. Patient is aware will not see Dr. Raliegh Ip.

## 2021-01-30 NOTE — ED Triage Notes (Signed)
Pt arrive to ED by [facility] from home for c/o left side cp for a week 3 nitros and 324 mg ASA given by ems pta with no relief. BP 142/76, HR 98, R 19, SPO2 93% RA

## 2021-01-31 ENCOUNTER — Other Ambulatory Visit (HOSPITAL_COMMUNITY): Payer: PPO

## 2021-01-31 ENCOUNTER — Inpatient Hospital Stay (HOSPITAL_COMMUNITY): Payer: PPO

## 2021-01-31 DIAGNOSIS — Z950 Presence of cardiac pacemaker: Secondary | ICD-10-CM | POA: Diagnosis not present

## 2021-01-31 DIAGNOSIS — I25119 Atherosclerotic heart disease of native coronary artery with unspecified angina pectoris: Secondary | ICD-10-CM | POA: Diagnosis present

## 2021-01-31 DIAGNOSIS — Z8546 Personal history of malignant neoplasm of prostate: Secondary | ICD-10-CM | POA: Diagnosis not present

## 2021-01-31 DIAGNOSIS — Z96641 Presence of right artificial hip joint: Secondary | ICD-10-CM | POA: Diagnosis present

## 2021-01-31 DIAGNOSIS — I442 Atrioventricular block, complete: Secondary | ICD-10-CM | POA: Diagnosis not present

## 2021-01-31 DIAGNOSIS — N183 Chronic kidney disease, stage 3 unspecified: Secondary | ICD-10-CM | POA: Diagnosis not present

## 2021-01-31 DIAGNOSIS — I130 Hypertensive heart and chronic kidney disease with heart failure and stage 1 through stage 4 chronic kidney disease, or unspecified chronic kidney disease: Secondary | ICD-10-CM | POA: Diagnosis not present

## 2021-01-31 DIAGNOSIS — I214 Non-ST elevation (NSTEMI) myocardial infarction: Secondary | ICD-10-CM

## 2021-01-31 DIAGNOSIS — I350 Nonrheumatic aortic (valve) stenosis: Secondary | ICD-10-CM | POA: Diagnosis present

## 2021-01-31 DIAGNOSIS — I5031 Acute diastolic (congestive) heart failure: Secondary | ICD-10-CM | POA: Insufficient documentation

## 2021-01-31 DIAGNOSIS — J45909 Unspecified asthma, uncomplicated: Secondary | ICD-10-CM | POA: Diagnosis not present

## 2021-01-31 DIAGNOSIS — I255 Ischemic cardiomyopathy: Secondary | ICD-10-CM | POA: Diagnosis present

## 2021-01-31 DIAGNOSIS — N1832 Chronic kidney disease, stage 3b: Secondary | ICD-10-CM | POA: Diagnosis not present

## 2021-01-31 DIAGNOSIS — E039 Hypothyroidism, unspecified: Secondary | ICD-10-CM | POA: Diagnosis not present

## 2021-01-31 DIAGNOSIS — D509 Iron deficiency anemia, unspecified: Secondary | ICD-10-CM | POA: Diagnosis not present

## 2021-01-31 DIAGNOSIS — I249 Acute ischemic heart disease, unspecified: Secondary | ICD-10-CM | POA: Insufficient documentation

## 2021-01-31 DIAGNOSIS — I739 Peripheral vascular disease, unspecified: Secondary | ICD-10-CM | POA: Diagnosis not present

## 2021-01-31 DIAGNOSIS — I459 Conduction disorder, unspecified: Secondary | ICD-10-CM | POA: Diagnosis present

## 2021-01-31 DIAGNOSIS — F17220 Nicotine dependence, chewing tobacco, uncomplicated: Secondary | ICD-10-CM | POA: Diagnosis not present

## 2021-01-31 DIAGNOSIS — I25110 Atherosclerotic heart disease of native coronary artery with unstable angina pectoris: Secondary | ICD-10-CM | POA: Diagnosis not present

## 2021-01-31 DIAGNOSIS — I5043 Acute on chronic combined systolic (congestive) and diastolic (congestive) heart failure: Secondary | ICD-10-CM | POA: Diagnosis not present

## 2021-01-31 DIAGNOSIS — I5032 Chronic diastolic (congestive) heart failure: Secondary | ICD-10-CM | POA: Diagnosis not present

## 2021-01-31 DIAGNOSIS — I272 Pulmonary hypertension, unspecified: Secondary | ICD-10-CM | POA: Diagnosis not present

## 2021-01-31 DIAGNOSIS — I5023 Acute on chronic systolic (congestive) heart failure: Secondary | ICD-10-CM | POA: Diagnosis not present

## 2021-01-31 DIAGNOSIS — I2510 Atherosclerotic heart disease of native coronary artery without angina pectoris: Secondary | ICD-10-CM | POA: Diagnosis not present

## 2021-01-31 DIAGNOSIS — E782 Mixed hyperlipidemia: Secondary | ICD-10-CM | POA: Diagnosis present

## 2021-01-31 DIAGNOSIS — I480 Paroxysmal atrial fibrillation: Secondary | ICD-10-CM | POA: Diagnosis present

## 2021-01-31 DIAGNOSIS — Z20822 Contact with and (suspected) exposure to covid-19: Secondary | ICD-10-CM | POA: Diagnosis not present

## 2021-01-31 DIAGNOSIS — K219 Gastro-esophageal reflux disease without esophagitis: Secondary | ICD-10-CM | POA: Diagnosis present

## 2021-01-31 DIAGNOSIS — I6930 Unspecified sequelae of cerebral infarction: Secondary | ICD-10-CM | POA: Diagnosis not present

## 2021-01-31 DIAGNOSIS — Z86010 Personal history of colonic polyps: Secondary | ICD-10-CM | POA: Diagnosis not present

## 2021-01-31 LAB — ECHOCARDIOGRAM LIMITED
AR max vel: 1.1 cm2
AV Area VTI: 1.24 cm2
AV Area mean vel: 1.07 cm2
AV Mean grad: 10 mmHg
AV Peak grad: 18.7 mmHg
Ao pk vel: 2.16 m/s
Area-P 1/2: 3.51 cm2
Calc EF: 39.9 %
Height: 62 in
P 1/2 time: 342 msec
S' Lateral: 3.9 cm
Single Plane A2C EF: 42.2 %
Single Plane A4C EF: 36.7 %
Weight: 3022.95 oz

## 2021-01-31 LAB — CBC
HCT: 34.4 % — ABNORMAL LOW (ref 39.0–52.0)
Hemoglobin: 11.6 g/dL — ABNORMAL LOW (ref 13.0–17.0)
MCH: 31.3 pg (ref 26.0–34.0)
MCHC: 33.7 g/dL (ref 30.0–36.0)
MCV: 92.7 fL (ref 80.0–100.0)
Platelets: 203 10*3/uL (ref 150–400)
RBC: 3.71 MIL/uL — ABNORMAL LOW (ref 4.22–5.81)
RDW: 13 % (ref 11.5–15.5)
WBC: 8.2 10*3/uL (ref 4.0–10.5)
nRBC: 0 % (ref 0.0–0.2)

## 2021-01-31 LAB — LIPID PANEL
Cholesterol: 164 mg/dL (ref 0–200)
HDL: 55 mg/dL (ref >40)
LDL Cholesterol: 92 mg/dL (ref 0–99)
Total CHOL/HDL Ratio: 3 RATIO
Triglycerides: 83 mg/dL (ref <150)
VLDL: 17 mg/dL (ref 0–40)

## 2021-01-31 LAB — APTT
aPTT: 75 seconds — ABNORMAL HIGH (ref 24–36)
aPTT: 101 seconds — ABNORMAL HIGH (ref 24–36)

## 2021-01-31 LAB — HEPARIN LEVEL (UNFRACTIONATED): Heparin Unfractionated: >1.1 IU/mL — ABNORMAL HIGH (ref 0.30–0.70)

## 2021-01-31 LAB — TROPONIN I (HIGH SENSITIVITY): Troponin I (High Sensitivity): 1730 ng/L (ref <18)

## 2021-01-31 MED ORDER — HYDROCODONE-ACETAMINOPHEN 5-325 MG PO TABS
1.0000 | ORAL_TABLET | Freq: Two times a day (BID) | ORAL | Status: DC | PRN
  Administered 2021-01-31 – 2021-02-05 (×9): 1 via ORAL
  Filled 2021-01-31 (×10): qty 1

## 2021-01-31 MED ORDER — PANTOPRAZOLE SODIUM 40 MG PO TBEC
40.0000 mg | DELAYED_RELEASE_TABLET | Freq: Every day | ORAL | Status: DC
  Administered 2021-01-31 – 2021-02-05 (×6): 40 mg via ORAL
  Filled 2021-01-31 (×6): qty 1

## 2021-01-31 MED ORDER — METOPROLOL TARTRATE 25 MG PO TABS
25.0000 mg | ORAL_TABLET | Freq: Two times a day (BID) | ORAL | Status: DC
  Administered 2021-01-31 – 2021-02-05 (×11): 25 mg via ORAL
  Filled 2021-01-31 (×11): qty 1

## 2021-01-31 MED ORDER — ACETAMINOPHEN 325 MG PO TABS
650.0000 mg | ORAL_TABLET | ORAL | Status: DC | PRN
  Administered 2021-01-31 – 2021-02-04 (×5): 650 mg via ORAL
  Filled 2021-01-31 (×5): qty 2

## 2021-01-31 MED ORDER — AMLODIPINE BESYLATE 5 MG PO TABS
5.0000 mg | ORAL_TABLET | Freq: Every day | ORAL | Status: DC
  Administered 2021-01-31 – 2021-02-02 (×3): 5 mg via ORAL
  Filled 2021-01-31 (×3): qty 1

## 2021-01-31 MED ORDER — BUDESONIDE 0.5 MG/2ML IN SUSP
0.5000 mg | Freq: Two times a day (BID) | RESPIRATORY_TRACT | Status: DC
  Administered 2021-01-31 – 2021-02-05 (×8): 0.5 mg via RESPIRATORY_TRACT
  Filled 2021-01-31 (×11): qty 2

## 2021-01-31 MED ORDER — RANOLAZINE ER 500 MG PO TB12
500.0000 mg | ORAL_TABLET | Freq: Two times a day (BID) | ORAL | Status: DC
  Administered 2021-01-31 – 2021-02-05 (×11): 500 mg via ORAL
  Filled 2021-01-31 (×12): qty 1

## 2021-01-31 MED ORDER — FERROUS SULFATE 325 (65 FE) MG PO TABS
325.0000 mg | ORAL_TABLET | Freq: Every day | ORAL | Status: DC
  Administered 2021-01-31 – 2021-02-05 (×6): 325 mg via ORAL
  Filled 2021-01-31 (×6): qty 1

## 2021-01-31 MED ORDER — ASPIRIN 300 MG RE SUPP: 300.0000 mg | RECTAL | Status: AC

## 2021-01-31 MED ORDER — METOPROLOL TARTRATE 25 MG PO TABS
50.0000 mg | ORAL_TABLET | Freq: Once | ORAL | Status: AC
  Administered 2021-01-31: 50 mg via ORAL
  Filled 2021-01-31: qty 2

## 2021-01-31 MED ORDER — ADULT MULTIVITAMIN W/MINERALS CH
1.0000 | ORAL_TABLET | Freq: Every day | ORAL | Status: DC
  Administered 2021-01-31 – 2021-02-05 (×6): 1 via ORAL
  Filled 2021-01-31 (×6): qty 1

## 2021-01-31 MED ORDER — LEVOTHYROXINE SODIUM 50 MCG PO TABS
50.0000 ug | ORAL_TABLET | Freq: Every day | ORAL | Status: DC
  Administered 2021-01-31 – 2021-02-05 (×6): 50 ug via ORAL
  Filled 2021-01-31: qty 1
  Filled 2021-01-31: qty 2
  Filled 2021-01-31 (×4): qty 1

## 2021-01-31 MED ORDER — ONDANSETRON HCL 4 MG/2ML IJ SOLN
4.0000 mg | Freq: Four times a day (QID) | INTRAMUSCULAR | Status: DC | PRN
  Administered 2021-01-31: 4 mg via INTRAVENOUS
  Filled 2021-01-31: qty 2

## 2021-01-31 MED ORDER — VITAMIN B-12 1000 MCG PO TABS
1000.0000 ug | ORAL_TABLET | Freq: Every day | ORAL | Status: DC
  Administered 2021-01-31 – 2021-02-05 (×6): 1000 ug via ORAL
  Filled 2021-01-31 (×6): qty 1

## 2021-01-31 MED ORDER — ASPIRIN 81 MG PO CHEW
324.0000 mg | CHEWABLE_TABLET | ORAL | Status: AC
  Administered 2021-01-31: 324 mg via ORAL
  Filled 2021-01-31: qty 4

## 2021-01-31 MED ORDER — ALBUTEROL SULFATE HFA 108 (90 BASE) MCG/ACT IN AERS
2.0000 | INHALATION_SPRAY | Freq: Four times a day (QID) | RESPIRATORY_TRACT | Status: DC | PRN
  Administered 2021-01-31 – 2021-02-05 (×3): 2 via RESPIRATORY_TRACT
  Filled 2021-01-31: qty 6.7

## 2021-01-31 MED ORDER — ATORVASTATIN CALCIUM 40 MG PO TABS
40.0000 mg | ORAL_TABLET | Freq: Every day | ORAL | Status: DC
  Administered 2021-01-31 – 2021-02-02 (×3): 40 mg via ORAL
  Filled 2021-01-31 (×3): qty 1

## 2021-01-31 MED ORDER — ASPIRIN EC 81 MG PO TBEC
81.0000 mg | DELAYED_RELEASE_TABLET | Freq: Every day | ORAL | Status: DC
  Administered 2021-02-01 – 2021-02-05 (×4): 81 mg via ORAL
  Filled 2021-01-31 (×4): qty 1

## 2021-01-31 MED ORDER — ALBUTEROL SULFATE (2.5 MG/3ML) 0.083% IN NEBU
INHALATION_SOLUTION | RESPIRATORY_TRACT | Status: AC
  Administered 2021-01-31: 2.5 mg
  Filled 2021-01-31: qty 3

## 2021-01-31 NOTE — Progress Notes (Addendum)
ANTICOAGULATION CONSULT NOTE - Initial Consult  Pharmacy Consult for Heparin (Apixaban on hold) Indication: chest pain/ACS and atrial fibrillation  No Known Allergies  Patient Measurements: Height: '5\' 2"'$  (157.5 cm) Weight: 85.7 kg (188 lb 15 oz) IBW/kg (Calculated) : 54.6   Vital Signs: Temp: 98.3 F (36.8 C) (08/12 2320) Temp Source: Oral (08/12 2320) BP: 162/73 (08/12 2345) Pulse Rate: 91 (08/12 2345)  Labs: Recent Labs    01/30/21 2042 01/30/21 2315  HGB 11.5*  --   HCT 34.6*  --   PLT 193  --   CREATININE 1.30*  --   TROPONINIHS 1,689* 1,730*    Estimated Creatinine Clearance: 40.8 mL/min (A) (by C-G formula based on SCr of 1.3 mg/dL (H)).   Medical History: Past Medical History:  Diagnosis Date   Acute diastolic CHF (congestive heart failure) ([place]) 04/12/2019   Acute on chronic combined systolic and diastolic CHF (congestive heart failure) ([place]) 04/09/2019   Arthritis    Asthma    BMI 32.0-32.9,adult 04/17/2020   Chronic anemia    CKD (chronic kidney disease), stage III (HCC)    Complete atrioventricular block ([place]) 04/01/2015   Complete heart block ([place]) 04/01/2015   Coronary artery disease    a. moderate nonobstructive CAD by cath in 2018.   Coronary atherosclerosis of native coronary artery 04/09/2019   Mid RCA lesion, 60 %stenosed. Prox LAD lesion, 40 %stenosed. Prox LAD to Mid LAD lesion, 50 %stenosed. LPDA lesion, 60 %stenosed.   Disorder of carotid artery ([place]) 12/29/2016   Dyslipidemia 12/29/2016   Essential hypertension, benign 08/15/2017   Exertional angina ([place]) 02/08/2017   Gastritis and gastroduodenitis    GERD (gastroesophageal reflux disease)    Hypothyroidism    Iron deficiency anemia    Late effect of cerebrovascular accident (CVA) 05/25/2017   Late effects of cerebrovascular disease 05/25/2017   Malignant neoplasm of prostate ([place]) 08/17/2019   Mixed hyperlipidemia 12/14/2019   Obesity 01/25/2018   Osteoarthritis of both knees 08/17/2019    Pacemaker 08/08/2019   PAF (paroxysmal atrial fibrillation) (HCC)    Paroxysmal atrial fibrillation ([place]) 04/01/2015   Peripheral vascular disease ([place])    Polyp of cecum    Precordial pain 04/23/2015   Preinfarction syndrome ([place]) 02/08/2017   Right-sided carotid artery disease ([place]) 12/29/2016   Routine general medical examination at a health care facility 04/01/2015   Sequela of cerebrovascular accident 05/25/2017   Symptomatic anemia 04/12/2019     Assessment: 86 y/o M who presents to the ED with several days of chest pain, troponin is elevated, on apixaban PTA for afib, last dose >12 hours ago, ok to start heparin now, anticipate using aPTT to dose for now. Hgb 11.5. Scr 1.3.   Goal of Therapy:  Heparin level 0.3-0.7 units/ml aPTT 66-102 secs Monitor platelets by anticoagulation protocol: Yes   Plan:  Start heparin drip at 1000 units/hr 0800 Heparin level/aPTT Daily CBC/Heparin level/aPTT Monitor for bleeding  Narda Bonds, PharmD, BCPS Clinical Pharmacist Phone: [phone]

## 2021-01-31 NOTE — Progress Notes (Signed)
ANTICOAGULATION CONSULT NOTE - Initial Consult  Pharmacy Consult for Heparin (Apixaban on hold) Indication: chest pain/ACS and atrial fibrillation  No Known Allergies  Patient Measurements: Height: '5\' 2"'$  (157.5 cm) Weight: 85.7 kg (188 lb 15 oz) IBW/kg (Calculated) : 54.6   Vital Signs: Temp: 98.7 F (37.1 C) (08/13 0728) Temp Source: Oral (08/13 0728) BP: 143/70 (08/13 0900) Pulse Rate: 64 (08/13 0900)  Labs: Recent Labs    01/30/21 2042 01/30/21 2315 01/31/21 0758  HGB 11.5*  --  11.6*  HCT 34.6*  --  34.4*  PLT 193  --  203  APTT  --   --  75*  HEPARINUNFRC  --   --  >1.10*  CREATININE 1.30*  --   --   TROPONINIHS 1,689* 1,730*  --      Estimated Creatinine Clearance: 40.8 mL/min (A) (by C-G formula based on SCr of 1.3 mg/dL (H)).   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 moderate nonobstructive CAD by cath in 2018.   Coronary atherosclerosis of native coronary artery 04/09/2019   Mid RCA lesion, 60 %stenosed. Prox LAD lesion, 40 %stenosed. Prox LAD to Mid LAD lesion, 50 %stenosed. LPDA lesion, 60 %stenosed.   Disorder of carotid artery ([place]) 12/29/2016   Dyslipidemia 12/29/2016   Essential hypertension, benign 08/15/2017   Exertional angina ([place]) 02/08/2017   Gastritis and gastroduodenitis    GERD (gastroesophageal reflux disease)    Hypothyroidism    Iron deficiency anemia    Late effect of cerebrovascular accident (CVA) 05/25/2017   Late effects of cerebrovascular disease 05/25/2017   Malignant neoplasm of prostate ([place]) 08/17/2019    Mixed hyperlipidemia 12/14/2019   Obesity 01/25/2018   Osteoarthritis of both knees 08/17/2019   Pacemaker 08/08/2019   PAF (paroxysmal atrial fibrillation) (HCC)    Paroxysmal atrial fibrillation ([place]) 04/01/2015   Peripheral vascular disease ([place])    Polyp of cecum    Precordial pain 04/23/2015   Preinfarction syndrome ([place]) 02/08/2017   Right-sided carotid artery disease ([place]) 12/29/2016   Routine general medical examination at a health care facility 04/01/2015   [place] of cerebrovascular accident 05/25/2017   Symptomatic anemia 04/12/2019   Assessment: 86 y/o M who presents to the ED with several days of chest pain, troponin is elevated, on apixaban PTA for afib, last dose >12 hours prior to arrival. Cardiology planning for possibel LHC on Monday.  No issues with heparin overnight per RN. Hgb 11.6; plt 203 - Stable.  aPTT/Heparin Level @ 0758 8/13 - 75 sec / > 1.1IU/mL. Will require aPTT monitoring until heparin level is correlated. aPTT therapeutic this morning.  Goal of Therapy:  Heparin level 0.3-0.7 units/ml aPTT 66-102 secs Monitor platelets by anticoagulation protocol: Yes   Plan:  Continue heparin drip at 1000 units/hr 1700 aPTT aPTT monitoring until heparin level is correlated Daily CBC/Heparin level/aPTT Monitor for bleeding  Lorelei Pont, PharmD, BCPS 01/31/2021 9:44 AM ED Clinical Pharmacist -  [phone]

## 2021-01-31 NOTE — Progress Notes (Signed)
Progress Note  Patient Name: [name] Date of Encounter: 01/31/2021  [facility] Cardiologist: Jenne Campus, MD   Subjective   No CP or dyspnea this AM  Inpatient Medications    Scheduled Meds:  amLODipine  5 mg Oral QHS   [START ON 02/01/2021] aspirin EC  81 mg Oral Daily   atorvastatin  40 mg Oral Daily   budesonide  0.5 mg Inhalation BID   ferrous sulfate  325 mg Oral Q breakfast   levothyroxine  50 mcg Oral Daily   multivitamin with minerals  1 tablet Oral Daily   pantoprazole  40 mg Oral Daily   ranolazine  500 mg Oral BID   vitamin B-12  1,000 mcg Oral Daily   Continuous Infusions:  heparin 1,000 Units/hr (01/31/21 0916)   nitroGLYCERIN 7 mcg/min (01/31/21 0313)   PRN Meds: acetaminophen, albuterol, HYDROcodone-acetaminophen, ondansetron (ZOFRAN) IV   Vital Signs    Vitals:   01/31/21 0700 01/31/21 0728 01/31/21 0800 01/31/21 0900  BP: 130/63 125/60 (!) 146/70 (!) 143/70  Pulse: 62 (!) 59 62 64  Resp: '17 17 15 15  '$ Temp:  98.7 F (37.1 C)    TempSrc:  Oral    SpO2: 97% 97% 95% 93%  Weight:      Height:        Intake/Output Summary (Last 24 hours) at 01/31/2021 0931 Last data filed at 01/31/2021 0313 Gross per 24 hour  Intake 6.49 ml  Output --  Net 6.49 ml   Last 3 Weights 01/30/2021 08/15/2020 07/24/2020  Weight (lbs) 188 lb 15 oz 189 lb 191 lb  Weight (kg) 85.7 kg 85.73 kg 86.637 kg       Physical Exam   GEN: No acute distress.   Neck: No JVD Cardiac: RRR, 2/6 systolic murmur LSB Respiratory: Clear to auscultation bilaterally. GI: Soft, nontender, non-distended  MS: No edema Neuro:  Nonfocal  Psych: Normal affect   Labs    High Sensitivity Troponin:   Recent Labs  Lab 01/30/21 2042 01/30/21 2315  TROPONINIHS 1,689* 1,730*      Chemistry Recent Labs  Lab 01/30/21 2042  NA 128*  K 3.6  CL 99  CO2 18*  GLUCOSE 115*  BUN 13  CREATININE 1.30*  CALCIUM 7.9*  PROT 6.2*  ALBUMIN 3.2*  AST 30  ALT 17  ALKPHOS 62   BILITOT 0.8  GFRNONAA 55*  ANIONGAP 11     Hematology Recent Labs  Lab 01/30/21 2042 01/31/21 0758  WBC 10.3 8.2  RBC 3.70* 3.71*  HGB 11.5* 11.6*  HCT 34.6* 34.4*  MCV 93.5 92.7  MCH 31.1 31.3  MCHC 33.2 33.7  RDW 12.6 13.0  PLT 193 203    BNP Recent Labs  Lab 01/30/21 2042  BNP 735.1*     Radiology    DG Chest Port 1 View  Result Date: 01/30/2021 CLINICAL DATA:  Left-sided chest pain x1 week. EXAM: PORTABLE CHEST 1 VIEW COMPARISON:  April 09, 2019 FINDINGS: There is a dual lead AICD. Stable, chronic appearing increased lung markings are noted with mild, stable areas of bibasilar atelectasis. There is no evidence of a pleural effusion or pneumothorax. The cardiac silhouette is moderately enlarged and unchanged in size. A chronic deformity is seen involving the mid to distal right clavicle. IMPRESSION: Stable cardiomegaly with mild bibasilar atelectasis. Electronically Signed   By: Virgina Norfolk M.D.   On: 01/30/2021 21:19     Patient Profile     86 y.o. male  with past medical history of paroxysmal atrial fibrillation, chronic diastolic congestive heart failure, chronic stage IIIa kidney disease, nonobstructive coronary disease admitted with unstable angina.  Cardiac catheterization 2018 showed 60% right, 50% LAD and 60% PDA.  Medical therapy recommended.  Last echocardiogram March 2022 showed normal LV function, moderate pulmonary hypertension, moderate left atrial enlargement, mild to moderate mitral regurgitation, mild to moderate tricuspid regurgitation, mild to moderate aortic insufficiency, moderate aortic stenosis with mean gradient 21 mmHg.  Assessment & Plan    1 chest pain-symptoms somewhat atypical and chronic based on patient's report.  Troponin elevated though in the setting of renal insufficiency.  We will continue to cycle.  If there is a clear trend up he will likely need cardiac catheterization.  We will continue aspirin, nitroglycerin, heparin and  statin for now.  Add metoprolol 25 mg twice daily.  Add IV heparin while off apixaban.  2 aortic stenosis-S2 is not diminished on examination and previous echocardiogram showed moderate AS.  Follow-up echocardiogram has been ordered.  3 chronic diastolic congestive heart failure-patient does not appear to be volume overloaded on examination.  We will continue to follow clinically.  4 chronic stage IIIa kidney disease-follow renal function after receiving diuretics in the emergency room.  5 paroxysmal atrial fibrillation-he is in sinus rhythm.  Apixaban on hold as he may require cardiac catheterization.  We will continue metoprolol.  6 hypertension-blood pressure elevated.  We will add metoprolol and follow.  7 prior pacemaker  For questions or updates, please contact [place] Please consult [email or web address] for contact info under        Signed, Kirk Ruths, MD  01/31/2021, 9:31 AM

## 2021-01-31 NOTE — Progress Notes (Signed)
  Echocardiogram 2D Echocardiogram has been performed.  [name] 01/31/2021, 4:58 PM

## 2021-01-31 NOTE — H&P (Signed)
Cardiology Admission History and Physical:   Patient ID: [name] MRN: [id number]; DOB: [birth year]   Admission date: 01/30/2021  Primary Care Provider: Lillard Anes, MD Primary Cardiologist: Jenne Campus, MD  Primary Electrophysiologist:  Will Meredith Leeds, MD   Chief Complaint:  Chest pain  Patient Profile:   [name] is a 86 y.o. male with history of nonobstructive coronary sclerotic heart disease, status post permanent pacemaker, Eliquis for atrial fibrillation iron deficiency anemia and heart failure with preserved ejection fraction and chronic kidney disease with recent exertional anginal symptoms.  History of Present Illness:   [name] is a pleasant elderly gentleman who presented with chest discomfort and shortness of breath. Chest pain earlier today was severe in severity and reports that tight in his chest without radiation.  This was occurring with activities of took some rest.  He states symptoms started earlier this week on Tuesday however he thought they would improve and they had kind of stuttered and worsen today and thus thus why he presented to the hospital.   Past Medical History:  Diagnosis Date   Acute diastolic CHF (congestive heart failure) ([place]) 04/12/2019   Acute on chronic combined systolic and diastolic CHF (congestive heart failure) ([place]) 04/09/2019   Arthritis    Asthma    BMI 32.0-32.9,adult 04/17/2020   Chronic anemia    CKD (chronic kidney disease), stage III (HCC)    Complete atrioventricular block ([place]) 04/01/2015   Complete heart block ([place]) 04/01/2015   Coronary artery disease    a. moderate nonobstructive CAD by cath in 2018.   Coronary atherosclerosis of native coronary artery 04/09/2019   Mid RCA lesion, 60 %stenosed. Prox LAD lesion, 40 %stenosed. Prox LAD to Mid LAD lesion, 50 %stenosed. LPDA lesion, 60 %stenosed.   Disorder of carotid artery ([place]) 12/29/2016   Dyslipidemia 12/29/2016   Essential  hypertension, benign 08/15/2017   Exertional angina ([place]) 02/08/2017   Gastritis and gastroduodenitis    GERD (gastroesophageal reflux disease)    Hypothyroidism    Iron deficiency anemia    Late effect of cerebrovascular accident (CVA) 05/25/2017   Late effects of cerebrovascular disease 05/25/2017   Malignant neoplasm of prostate ([place]) 08/17/2019   Mixed hyperlipidemia 12/14/2019   Obesity 01/25/2018   Osteoarthritis of both knees 08/17/2019   Pacemaker 08/08/2019   PAF (paroxysmal atrial fibrillation) (HCC)    Paroxysmal atrial fibrillation ([place]) 04/01/2015   Peripheral vascular disease ([place])    Polyp of cecum    Precordial pain 04/23/2015   Preinfarction syndrome ([place]) 02/08/2017   Right-sided carotid artery disease ([place]) 12/29/2016   Routine general medical examination at a health care facility 04/01/2015   Sequela of cerebrovascular accident 05/25/2017   Symptomatic anemia 04/12/2019    Past Surgical History:  Procedure Laterality Date   BIOPSY  04/11/2019   Procedure: BIOPSY;  Surgeon: Milus Banister, MD;  Location: [facility];  Service: Endoscopy;;   CATARACT EXTRACTION     COLONOSCOPY WITH PROPOFOL N/A 04/11/2019   Procedure: COLONOSCOPY WITH PROPOFOL;  Surgeon: Milus Banister, MD;  Location: [facility] ENDOSCOPY;  Service: Endoscopy;  Laterality: N/A;   ESOPHAGOGASTRODUODENOSCOPY (EGD) WITH PROPOFOL N/A 04/11/2019   Procedure: ESOPHAGOGASTRODUODENOSCOPY (EGD) WITH PROPOFOL;  Surgeon: Milus Banister, MD;  Location: [facility] ENDOSCOPY;  Service: Endoscopy;  Laterality: N/A;   HIP SURGERY     INSERT / REPLACE / REMOVE PACEMAKER     [employer]   JOINT REPLACEMENT Right 10/20/1994   Hip  (Left Hip in 10/96)  LEFT HEART CATH AND CORONARY ANGIOGRAPHY N/A 02/08/2017   Procedure: LEFT HEART CATH AND CORONARY ANGIOGRAPHY;  Surgeon: Martinique, Peter M, MD;  Location: [facility];  Service: Cardiovascular;  Laterality: N/A;   ORIF PERIPROSTHETIC FRACTURE Right 08/17/2017   Procedure: OPEN  REDUCTION INTERNAL FIXATION (ORIF) PERIPROSTHETIC FRACTURE RIGHT FEMUR;  Surgeon: Paralee Cancel, MD;  Location: [facility];  Service: Orthopedics;  Laterality: Right;   ORIF PERIPROSTHETIC FRACTURE Right 01/23/2018   Procedure: Redo open reduction internal fixation right periprosthetic femur fracture;  Surgeon: Paralee Cancel, MD;  Location: [facility];  Service: Orthopedics;  Laterality: Right;  120 mins   POLYPECTOMY  04/11/2019   Procedure: POLYPECTOMY;  Surgeon: Milus Banister, MD;  Location: [facility] ENDOSCOPY;  Service: Endoscopy;;     Medications Prior to Admission: Prior to Admission medications   Medication Sig Start Date End Date Taking? Authorizing Provider  albuterol (VENTOLIN HFA) 108 (90 Base) MCG/ACT inhaler INHALE 2 PUFFS BY MOUTH EVERY 6 HOURS AS NEEDED Patient taking differently: Inhale 2 puffs into the lungs every 6 (six) hours as needed for wheezing or shortness of breath. 06/09/20  Yes Lillard Anes, MD  amLODipine (NORVASC) 5 MG tablet TAKE 1 TABLET(5 MG) BY MOUTH AT BEDTIME Patient taking differently: Take 5 mg by mouth at bedtime. 10/14/20  Yes Park Liter, MD  apixaban (ELIQUIS) 5 MG TABS tablet Take 1 tablet (5 mg total) by mouth 2 (two) times daily. 09/05/20  Yes Lillard Anes, MD  atorvastatin (LIPITOR) 40 MG tablet TAKE 1 TABLET BY MOUTH DAILY 12/25/20  Yes Lillard Anes, MD  fluticasone [facility] HFA) 110 MCG/ACT inhaler Inhale 2 puffs into the lungs 2 (two) times daily. 12/16/20  Yes Lillard Anes, MD  HYDROcodone-acetaminophen (NORCO/VICODIN) 5-325 MG tablet Take 1 tablet by mouth 2 (two) times daily as needed (pain). 01/26/21  Yes Lillard Anes, MD  isosorbide mononitrate (IMDUR) 60 MG 24 hr tablet TAKE 1 TABLET(60 MG) BY MOUTH DAILY Patient taking differently: Take 60 mg by mouth daily. 10/28/20  Yes Park Liter, MD  levothyroxine (SYNTHROID) 50 MCG tablet TAKE 1 TABLET BY MOUTH DAILY 01/18/21  Yes Lillard Anes, MD   Multiple Vitamin (MULTIVITAMIN WITH MINERALS) TABS tablet Take 1 tablet by mouth daily.   Yes [provider]  nitroGLYCERIN (NITROSTAT) 0.4 MG SL tablet DISSOLVE 1 TABLET UNDER THE TONGUE EVERY 5 MINUTES AS NEEDED FOR CHEST PAIN. MAX OF 3 DOSES IN 15 MINUTES 12/25/20  Yes Park Liter, MD  omeprazole (PRILOSEC) 20 MG capsule Take 20 mg by mouth daily.   Yes [provider]  ranolazine (RANEXA) 500 MG 12 hr tablet TAKE 1 TABLET(500 MG) BY MOUTH TWICE DAILY Patient taking differently: Take 500 mg by mouth 2 (two) times daily. TAKE 1 TABLET(500 MG) BY MOUTH TWICE DAILY 05/19/20  Yes Park Liter, MD  vitamin B-12 (CYANOCOBALAMIN) 1000 MCG tablet Take 1,000 mcg by mouth daily.    Yes [provider]  FEROSUL 325 (65 Fe) MG tablet TAKE 1 TABLET(325 MG) BY MOUTH TWICE DAILY Patient not taking: No sig reported 04/30/20   Lillard Anes, MD     Allergies:   No Known Allergies  Social History:   Social History   Socioeconomic History   Marital status: Married    Spouse name: Not on file   Number of children: 2   Years of education: Not on file   Highest education level: 6th grade  Occupational History  Occupation: Retired  Tobacco Use   Smoking status: Never   Smokeless tobacco: Current    Types: Snuff   Tobacco comments:    1 can per day  Vaping Use   Vaping Use: Never used  Substance and Sexual Activity   Alcohol use: Yes    Alcohol/week: 3.0 standard drinks    Types: 3 Cans of beer per week    Comment: regular use 4 times a week   Drug use: No   Sexual activity: Not Currently    Partners: Female    Birth control/protection: None  Other Topics Concern   Not on file  Social History Narrative   Not on file   Social Determinants of Health   Financial Resource Strain: Low Risk    Difficulty of Paying Living Expenses: Not hard at all  Food Insecurity: No Food Insecurity   Worried About Charity fundraiser in the Last Year: Never  true   Arboriculturist in the Last Year: Never true  Transportation Needs: Not on file  Physical Activity: Sufficiently Active   Days of Exercise per Week: 4 days   Minutes of Exercise per Session: 60 min  Stress: No Stress Concern Present   Feeling of Stress : Only a little  Social Connections: Moderately Isolated   Frequency of Communication with Friends and Family: Never   Frequency of Social Gatherings with Friends and Family: Never   Attends Religious Services: 1 to 4 times per year   Active Member of [employer] or Organizations: No   Attends Music therapist: Never   Marital Status: Married  Human resources officer Violence: Not At Risk   Fear of Current or Ex-Partner: No   Emotionally Abused: No   Physically Abused: No   Sexually Abused: No    Family History:   The patient's family history includes Heart attack in his mother.     Review of Systems: [y] = yes, '[ ]'$  = no   General: Weight gain '[ ]'$ ; Weight loss '[ ]'$ ; Anorexia '[ ]'$ ; Fatigue '[ ]'$ ; Fever '[ ]'$ ; Chills '[ ]'$ ; Weakness '[ ]'$   Cardiac: Chest pain/pressure [ y]; Resting SOB '[ ]'$ ; Exertional SOB [ y]; Orthopnea '[ ]'$ ; Pedal Edema '[ ]'$ ; Palpitations '[ ]'$ ; Syncope '[ ]'$ ; Presyncope '[ ]'$ ; Paroxysmal nocturnal dyspnea'[ ]'$   Pulmonary: Cough '[ ]'$ ; Wheezing'[ ]'$ ; Hemoptysis'[ ]'$ ; Sputum '[ ]'$ ; Snoring '[ ]'$   GI: Vomiting'[ ]'$ ; Dysphagia'[ ]'$ ; Melena'[ ]'$ ; Hematochezia '[ ]'$ ; Heartburn'[ ]'$ ; Abdominal pain '[ ]'$ ; Constipation '[ ]'$ ; Diarrhea '[ ]'$ ; BRBPR '[ ]'$   GU: Hematuria'[ ]'$ ; Dysuria '[ ]'$ ; Nocturia'[ ]'$   Vascular: Pain in legs with walking '[ ]'$ ; Pain in feet with lying flat '[ ]'$ ; Non-healing sores '[ ]'$ ; Stroke '[ ]'$ ; TIA '[ ]'$ ; Slurred speech '[ ]'$ ;  Neuro: Headaches'[ ]'$ ; Vertigo'[ ]'$ ; Seizures'[ ]'$ ; Paresthesias'[ ]'$ ;Blurred vision '[ ]'$ ; Diplopia '[ ]'$ ; Vision changes '[ ]'$   Ortho/Skin: Arthritis '[ ]'$ ; Joint pain '[ ]'$ ; Muscle pain '[ ]'$ ; Joint swelling '[ ]'$ ; Back Pain '[ ]'$ ; Rash '[ ]'$   Psych: Depression'[ ]'$ ; Anxiety'[ ]'$   Heme: Bleeding problems '[ ]'$ ; Clotting disorders '[ ]'$ ; Anemia '[ ]'$   Endocrine:  Diabetes '[ ]'$ ; Thyroid dysfunction'[ ]'$   Physical Exam/Data:   Vitals:   01/31/21 0115 01/31/21 0145 01/31/21 0215 01/31/21 0300  BP: (!) 161/70 (!) 154/74 (!) 141/71 (!) 149/81  Pulse: 80 89 88 89  Resp: '18 18 16 19  '$ Temp:      TempSrc:  SpO2: 95% 95% 96% 96%  Weight:      Height:        Intake/Output Summary (Last 24 hours) at 01/31/2021 0349 Last data filed at 01/31/2021 0313 Gross per 24 hour  Intake 6.49 ml  Output --  Net 6.49 ml   Filed Weights   01/30/21 2036  Weight: 85.7 kg   Body mass index is 34.56 kg/m.  General:  Well nourished, well developed, in no acute distress HEENT: normal Lymph: no adenopathy Neck: no JVD Endocrine:  No thryomegaly Vascular: No carotid bruits; FA pulses 2+ bilaterally without bruits  Cardiac:  normal S1, S2; RRR; no murmur  Lungs:  clear to auscultation bilaterally, no wheezing, rhonchi or rales  Abd: soft, nontender, no hepatomegaly  Ext: no edema Musculoskeletal:  No deformities, BUE and BLE strength normal and equal Skin: warm and dry  Neuro:  CNs 2-12 intact, no focal abnormalities noted Psych:  Normal affect    EKG:  The ECG that was done and was personally reviewed and demonstrates ventricular paced rhythm  Relevant CV Studies:    2D echo 04/10/19 IMPRESSIONS   1. Left ventricular ejection fraction, by visual estimation, is 60 to 65%. The left ventricle has normal function. Normal left ventricular size. Left ventricular septal wall thickness was mildly increased. Mildly increased left ventricular posterior  wall thickness.  2. Global right ventricle has normal systolic function.The right ventricular size is normal. No increase in right ventricular wall thickness.  3. A pacer wire is visualized.  4. Left atrial size was severely dilated.  5. Right atrial size was moderately dilated.  6. The mitral valve is normal in structure. Mild mitral valve regurgitation. No evidence of mitral stenosis.  7. The tricuspid valve is  normal in structure. Tricuspid valve regurgitation mild-moderate.  8. The aortic valve is tricuspid. There is Moderate thickening of the aortic valve. There is Moderate calcification of the aortic valve. Aortic valve regurgitation is mild to moderate by color flow Doppler. Mild aortic valve stenosis.  9. The pulmonic valve was normal in structure. Pulmonic valve regurgitation is not visualized by color flow Doppler. 10. Left ventricular diastolic Doppler parameters are consistent with pseudonormalization pattern of LV diastolic filling. 11. Elevated left atrial and left ventricular end-diastolic pressures.   LHC 2018:  Mid RCA lesion, 60 %stenosed. Prox LAD lesion, 40 %stenosed. Prox LAD to Mid LAD lesion, 50 %stenosed. LPDA lesion, 60 %stenosed.   1. Nonobstructive CAD  Laboratory Data:  Chemistry Recent Labs  Lab 01/30/21 2042  NA 128*  K 3.6  CL 99  CO2 18*  GLUCOSE 115*  BUN 13  CREATININE 1.30*  CALCIUM 7.9*  GFRNONAA 55*  ANIONGAP 11    Recent Labs  Lab 01/30/21 2042  PROT 6.2*  ALBUMIN 3.2*  AST 30  ALT 17  ALKPHOS 62  BILITOT 0.8   Hematology Recent Labs  Lab 01/30/21 2042  WBC 10.3  RBC 3.70*  HGB 11.5*  HCT 34.6*  MCV 93.5  MCH 31.1  MCHC 33.2  RDW 12.6  PLT 193   Cardiac EnzymesNo results for input(s): TROPONINI in the last 168 hours. No results for input(s): TROPIPOC in the last 168 hours.  BNP Recent Labs  Lab 01/30/21 2042  BNP 735.1*    DDimer No results for input(s): DDIMER in the last 168 hours.  Radiology/Studies:  [facility] Chest Port 1 View  Result Date: 01/30/2021 CLINICAL DATA:  Left-sided chest pain x1 week. EXAM: PORTABLE CHEST 1 VIEW COMPARISON:  April 09, 2019 FINDINGS: There is a dual lead AICD. Stable, chronic appearing increased lung markings are noted with mild, stable areas of bibasilar atelectasis. There is no evidence of a pleural effusion or pneumothorax. The cardiac silhouette is moderately enlarged and unchanged in  size. A chronic deformity is seen involving the mid to distal right clavicle. IMPRESSION: Stable cardiomegaly with mild bibasilar atelectasis. Electronically Signed   By: Virgina Norfolk M.D.   On: 01/30/2021 21:19    Assessment and Plan:   Non-ST elevation myocardial infarction Acute heart failure with unknown ejection fraction History of CVA Status post pacemaker Paroxysmal atrial fibrillation on Eliquis outpatient History of iron deficiency anemia  -Continue ACS therapies including heparin drip, nitroglycerin drip titrated to rest chest pain.  Beta-blockade added. -Agree with dose of IV Lasix and will continue to monitor.  Will avoid negative inotropes. -We will likely require left heart catheterization Monday. -Echocardiogram to evaluate LV function.   Severity of Illness: The appropriate patient status for this patient is INPATIENT. Inpatient status is judged to be reasonable and necessary in order to provide the required intensity of service to ensure the patient's safety. The patient's presenting symptoms, physical exam findings, and initial radiographic and laboratory data in the context of their chronic comorbidities is felt to place them at high risk for further clinical deterioration. Furthermore, it is not anticipated that the patient will be medically stable for discharge from the hospital within 2 midnights of admission. The following factors support the patient status of inpatient.   " The patient's presenting symptoms include chest pain and shortness of breath. " The worrisome physical exam findings include regular rate and rhythm. " The initial radiographic and laboratory data are worrisome because of elevated troponins and proBNP. " The chronic co-morbidities include chronic kidney disease.   * I certify that at the point of admission it is my clinical judgment that the patient will require inpatient hospital care spanning beyond 2 midnights from the point of admission  due to high intensity of service, high risk for further deterioration and high frequency of surveillance required.*   For questions or updates, please contact [place] Please consult [email or web address] for contact info under        Signed, Chancy Milroy, MD  01/31/2021 3:49 AM

## 2021-01-31 NOTE — ED Notes (Signed)
Nurse report given to Inocencio Homes

## 2021-01-31 NOTE — Progress Notes (Signed)
[place] for Heparin (Apixaban on hold) Indication: chest pain/ACS and atrial fibrillation  No Known Allergies  Patient Measurements: Height: '5\' 2"'$  (157.5 cm) Weight: 85.7 kg (188 lb 15 oz) IBW/kg (Calculated) : 54.6   Vital Signs: Temp: 98.4 F (36.9 C) (08/13 1533) Temp Source: Oral (08/13 1533) BP: 140/73 (08/13 1533) Pulse Rate: 66 (08/13 1531)  Labs: Recent Labs    01/30/21 2042 01/30/21 2315 01/31/21 0758 01/31/21 1656  HGB 11.5*  --  11.6*  --   HCT 34.6*  --  34.4*  --   PLT 193  --  203  --   APTT  --   --  75* 101*  HEPARINUNFRC  --   --  >1.10*  --   CREATININE 1.30*  --   --   --   TROPONINIHS 1,689* 1,730*  --   --      Estimated Creatinine Clearance: 40.8 mL/min (A) (by C-G formula based on SCr of 1.3 mg/dL (H)).   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 moderate nonobstructive CAD by cath in 2018.   Coronary atherosclerosis of native coronary artery 04/09/2019   Mid RCA lesion, 60 %stenosed. Prox LAD lesion, 40 %stenosed. Prox LAD to Mid LAD lesion, 50 %stenosed. LPDA lesion, 60 %stenosed.   Disorder of carotid artery ([place]) 12/29/2016   Dyslipidemia 12/29/2016   Essential hypertension, benign 08/15/2017   Exertional angina ([place]) 02/08/2017   Gastritis and gastroduodenitis    GERD (gastroesophageal reflux disease)    Hypothyroidism    Iron deficiency anemia    Late effect of cerebrovascular accident (CVA) 05/25/2017   Late effects of cerebrovascular disease 05/25/2017   Malignant neoplasm  of prostate ([place]) 08/17/2019   Mixed hyperlipidemia 12/14/2019   Obesity 01/25/2018   Osteoarthritis of both knees 08/17/2019   Pacemaker 08/08/2019   PAF (paroxysmal atrial fibrillation) (HCC)    Paroxysmal atrial fibrillation ([place]) 04/01/2015   Peripheral vascular disease ([place])    Polyp of cecum    Precordial pain 04/23/2015   Preinfarction syndrome ([place]) 02/08/2017   Right-sided carotid artery disease ([place]) 12/29/2016   Routine general medical examination at a health care facility 04/01/2015   Sequela of cerebrovascular accident 05/25/2017   Symptomatic anemia 04/12/2019   Assessment: 86 y/o M who presents to the ED with several days of chest pain, troponin is elevated, on apixaban PTA for afib, last dose >12 hours prior to arrival. Cardiology planning for possible LHC on Monday. -aPTT= 101 on 1000 units/hr  Goal of Therapy:  Heparin level 0.3-0.7 units/ml aPTT 66-102 secs Monitor platelets by anticoagulation protocol: Yes   Plan:  Continue heparin drip at 1000 units/hr aPTT monitoring until heparin level is correlated Daily CBC/Heparin level/aPTT  Hildred Laser, PharmD Clinical Pharmacist **Pharmacist phone directory can now be found on [email or web address] ([email or web address]).  Listed under [place].

## 2021-02-01 ENCOUNTER — Inpatient Hospital Stay (HOSPITAL_COMMUNITY): Payer: PPO

## 2021-02-01 DIAGNOSIS — I214 Non-ST elevation (NSTEMI) myocardial infarction: Secondary | ICD-10-CM | POA: Diagnosis not present

## 2021-02-01 LAB — CBC
HCT: 34.8 % — ABNORMAL LOW (ref 39.0–52.0)
Hemoglobin: 11.5 g/dL — ABNORMAL LOW (ref 13.0–17.0)
MCH: 30.9 pg (ref 26.0–34.0)
MCHC: 33 g/dL (ref 30.0–36.0)
MCV: 93.5 fL (ref 80.0–100.0)
Platelets: 215 10*3/uL (ref 150–400)
RBC: 3.72 MIL/uL — ABNORMAL LOW (ref 4.22–5.81)
RDW: 12.9 % (ref 11.5–15.5)
WBC: 10.6 10*3/uL — ABNORMAL HIGH (ref 4.0–10.5)
nRBC: 0 % (ref 0.0–0.2)

## 2021-02-01 LAB — URINALYSIS, COMPLETE (UACMP) WITH MICROSCOPIC
Bacteria, UA: NONE SEEN
Bilirubin Urine: NEGATIVE
Glucose, UA: NEGATIVE mg/dL
Hgb urine dipstick: NEGATIVE
Ketones, ur: NEGATIVE mg/dL
Leukocytes,Ua: NEGATIVE
Nitrite: NEGATIVE
Protein, ur: NEGATIVE mg/dL
Specific Gravity, Urine: 1.005 (ref 1.005–1.030)
pH: 6 (ref 5.0–8.0)

## 2021-02-01 LAB — BASIC METABOLIC PANEL
Anion gap: 12 (ref 5–15)
BUN: 15 mg/dL (ref 8–23)
CO2: 24 mmol/L (ref 22–32)
Calcium: 8.8 mg/dL — ABNORMAL LOW (ref 8.9–10.3)
Chloride: 97 mmol/L — ABNORMAL LOW (ref 98–111)
Creatinine, Ser: 1.31 mg/dL — ABNORMAL HIGH (ref 0.61–1.24)
GFR, Estimated: 54 mL/min — ABNORMAL LOW (ref >60)
Glucose, Bld: 107 mg/dL — ABNORMAL HIGH (ref 70–99)
Potassium: 4 mmol/L (ref 3.5–5.1)
Sodium: 133 mmol/L — ABNORMAL LOW (ref 135–145)

## 2021-02-01 LAB — HEPARIN LEVEL (UNFRACTIONATED): Heparin Unfractionated: >1.1 IU/mL — ABNORMAL HIGH (ref 0.30–0.70)

## 2021-02-01 LAB — APTT: aPTT: 66 seconds — ABNORMAL HIGH (ref 24–36)

## 2021-02-01 MED ORDER — SODIUM CHLORIDE 0.9% FLUSH: 3.0000 mL | INTRAVENOUS | Status: DC | PRN

## 2021-02-01 MED ORDER — SODIUM CHLORIDE 0.9% FLUSH
3.0000 mL | Freq: Two times a day (BID) | INTRAVENOUS | Status: DC
  Administered 2021-02-01 – 2021-02-04 (×8): 3 mL via INTRAVENOUS

## 2021-02-01 MED ORDER — ASPIRIN 81 MG PO CHEW
81.0000 mg | CHEWABLE_TABLET | ORAL | Status: AC
  Administered 2021-02-02: 81 mg via ORAL
  Filled 2021-02-01: qty 1

## 2021-02-01 MED ORDER — FUROSEMIDE 10 MG/ML IJ SOLN
40.0000 mg | Freq: Once | INTRAMUSCULAR | Status: AC
  Administered 2021-02-01: 40 mg via INTRAVENOUS
  Filled 2021-02-01: qty 4

## 2021-02-01 MED ORDER — SODIUM CHLORIDE 0.9 % IV SOLN: 250.0000 mL | INTRAVENOUS | Status: DC | PRN

## 2021-02-01 MED ORDER — SODIUM CHLORIDE 0.9 % IV SOLN: INTRAVENOUS | Status: DC

## 2021-02-01 NOTE — Progress Notes (Signed)
[facility] for Heparin (Apixaban on hold) Indication: chest pain/ACS and atrial fibrillation  No Known Allergies  Patient Measurements: Height: '5\' 2"'$  (157.5 cm) Weight: 84.7 kg (186 lb 11.2 oz) IBW/kg (Calculated) : 54.6   Vital Signs: Temp: 100.1 F (37.8 C) (08/14 0542) Temp Source: Oral (08/14 0542) BP: 144/65 (08/14 0542) Pulse Rate: 78 (08/14 0542)  Labs: Recent Labs    01/30/21 2042 01/30/21 2315 01/31/21 0758 01/31/21 1656 02/01/21 0222  HGB 11.5*  --  11.6*  --  11.5*  HCT 34.6*  --  34.4*  --  34.8*  PLT 193  --  203  --  215  APTT  --   --  75* 101* 66*  HEPARINUNFRC  --   --  >1.10*  --  >1.10*  CREATININE 1.30*  --   --   --  1.31*  TROPONINIHS 1,689* 1,730*  --   --   --      Estimated Creatinine Clearance: 40.2 mL/min (A) (by C-G formula based on SCr of 1.31 mg/dL (H)).   Medical History: Past Medical History:  Diagnosis Date   Acute diastolic CHF (congestive heart failure) ([place]) 04/12/2019   Acute on chronic combined systolic and diastolic CHF (congestive heart failure) ([place]) 04/09/2019   Arthritis    Asthma    BMI 32.0-32.9,adult 04/17/2020   Chronic anemia    CKD (chronic kidney disease), stage III (HCC)    Complete atrioventricular block ([place]) 04/01/2015   Complete heart block ([place]) 04/01/2015   Coronary artery disease    a. moderate nonobstructive CAD by cath in 2018.   Coronary atherosclerosis of native coronary artery 04/09/2019   Mid RCA lesion, 60 %stenosed. Prox LAD lesion, 40 %stenosed. Prox LAD to Mid LAD lesion, 50 %stenosed. LPDA lesion, 60 %stenosed.   Disorder of carotid artery ([place]) 12/29/2016   Dyslipidemia 12/29/2016   Essential hypertension, benign 08/15/2017   Exertional angina ([place]) 02/08/2017   Gastritis and gastroduodenitis    GERD (gastroesophageal reflux disease)    Hypothyroidism    Iron deficiency anemia    Late effect of cerebrovascular accident (CVA) 05/25/2017   Late effects  of cerebrovascular disease 05/25/2017   Malignant neoplasm of prostate ([place]) 08/17/2019   Mixed hyperlipidemia 12/14/2019   Obesity 01/25/2018   Osteoarthritis of both knees 08/17/2019   Pacemaker 08/08/2019   PAF (paroxysmal atrial fibrillation) (HCC)    Paroxysmal atrial fibrillation ([place]) 04/01/2015   Peripheral vascular disease ([place])    Polyp of cecum    Precordial pain 04/23/2015   Preinfarction syndrome ([place]) 02/08/2017   Right-sided carotid artery disease ([place]) 12/29/2016   Routine general medical examination at a health care facility 04/01/2015   Sequela of cerebrovascular accident 05/25/2017   Symptomatic anemia 04/12/2019   Assessment: 86 y/o M who presents to the ED with several days of chest pain, troponin is elevated. On apixaban PTA for afib, last dose >12 hours prior to arrival (01/30/21 '@0900'$ ). Cardiology planning for possible LHC on Monday.  Heparin level was  >1.10 and still not correlating with aPTT. aPTT is therapeutic at 66. CBC stable. Per convo with RN, no s/sx of bleeding or infusion issues.   Goal of Therapy:  Heparin level 0.3-0.7 units/ml aPTT 66-102 secs Monitor platelets by anticoagulation protocol: Yes   Plan:  Continue heparin drip at 1000 units/hr aPTT monitoring until heparin level is correlated Daily CBC/Heparin level/aPTT F/up: transition back to apixaban   Pauletta Browns, Pharm.D. PGY-1 Ambulatory Care Resident  [id number] 02/01/2021 7:03 AM

## 2021-02-01 NOTE — Progress Notes (Signed)
Progress Note  Patient Name: [name] Date of Encounter: 02/01/2021  [facility] Cardiologist: Jenne Campus, MD   Subjective   No recurrent CP; dyspneic earlier now improved.  Inpatient Medications    Scheduled Meds:  amLODipine  5 mg Oral QHS   aspirin EC  81 mg Oral Daily   atorvastatin  40 mg Oral Daily   budesonide  0.5 mg Inhalation BID   ferrous sulfate  325 mg Oral Q breakfast   levothyroxine  50 mcg Oral Daily   metoprolol tartrate  25 mg Oral BID   multivitamin with minerals  1 tablet Oral Daily   pantoprazole  40 mg Oral Daily   ranolazine  500 mg Oral BID   vitamin B-12  1,000 mcg Oral Daily   Continuous Infusions:  heparin 1,000 Units/hr (02/01/21 0221)   nitroGLYCERIN 10 mcg/min (01/31/21 2042)   PRN Meds: acetaminophen, albuterol, HYDROcodone-acetaminophen, ondansetron (ZOFRAN) IV   Vital Signs    Vitals:   02/01/21 0500 02/01/21 0542 02/01/21 0642 02/01/21 0746  BP:  (!) 144/65 (!) 144/61   Pulse:  78 73   Resp:  20    Temp:  100.1 F (37.8 C)    TempSrc:  Oral    SpO2:  92% 98% 99%  Weight: 84.7 kg     Height:        Intake/Output Summary (Last 24 hours) at 02/01/2021 0802 Last data filed at 02/01/2021 0601 Gross per 24 hour  Intake 518.79 ml  Output 1080 ml  Net -561.21 ml    Last 3 Weights 02/01/2021 01/30/2021 08/15/2020  Weight (lbs) 186 lb 11.2 oz 188 lb 15 oz 189 lb  Weight (kg) 84.687 kg 85.7 kg 85.73 kg     Telemetry-sinus; personally reviewed.  Physical Exam   GEN: WD WN NAD Neck: supple Cardiac: RRR, 2/6 systolic murmur LSB; no DM Respiratory: Diminished breath sounds left lower lobe GI: Soft, NT/ND MS: No edema Neuro:  Grossly intact Psych: Normal affect   Labs    High Sensitivity Troponin:   Recent Labs  Lab 01/30/21 2042 01/30/21 2315  TROPONINIHS 1,689* 1,730*       Chemistry Recent Labs  Lab 01/30/21 2042 02/01/21 0222  NA 128* 133*  K 3.6 4.0  CL 99 97*  CO2 18* 24  GLUCOSE 115*  107*  BUN 13 15  CREATININE 1.30* 1.31*  CALCIUM 7.9* 8.8*  PROT 6.2*  --   ALBUMIN 3.2*  --   AST 30  --   ALT 17  --   ALKPHOS 62  --   BILITOT 0.8  --   GFRNONAA 55* 54*  ANIONGAP 11 12      Hematology Recent Labs  Lab 01/30/21 2042 01/31/21 0758 02/01/21 0222  WBC 10.3 8.2 10.6*  RBC 3.70* 3.71* 3.72*  HGB 11.5* 11.6* 11.5*  HCT 34.6* 34.4* 34.8*  MCV 93.5 92.7 93.5  MCH 31.1 31.3 30.9  MCHC 33.2 33.7 33.0  RDW 12.6 13.0 12.9  PLT 193 203 215     BNP Recent Labs  Lab 01/30/21 2042  BNP 735.1*      Radiology    DG Chest Port 1 View  Result Date: 01/30/2021 CLINICAL DATA:  Left-sided chest pain x1 week. EXAM: PORTABLE CHEST 1 VIEW COMPARISON:  April 09, 2019 FINDINGS: There is a dual lead AICD. Stable, chronic appearing increased lung markings are noted with mild, stable areas of bibasilar atelectasis. There is no evidence of a pleural effusion or  pneumothorax. The cardiac silhouette is moderately enlarged and unchanged in size. A chronic deformity is seen involving the mid to distal right clavicle. IMPRESSION: Stable cardiomegaly with mild bibasilar atelectasis. Electronically Signed   By: Virgina Norfolk M.D.   On: 01/30/2021 21:19   ECHOCARDIOGRAM LIMITED  Result Date: 01/31/2021    ECHOCARDIOGRAM LIMITED REPORT   Patient Name:   [name] Date of Exam: 01/31/2021 Medical Rec #:  [id number]        Height:       62.0 in Accession #:    [id number]       Weight:       188.9 lb Date of Birth:  [birth year]        BSA:          1.866 m Patient Age:    86 years         BP:           124/67 mmHg Patient Gender: M                HR:           68 bpm. Exam Location:  Inpatient Procedure: Limited Echo, Color Doppler and Cardiac Doppler Indications:    acute myocardial infarction  History:        Patient has prior history of Echocardiogram examinations, most                 recent 08/27/2020. Chronic kidney disease, Arrythmias:complete                 heart block; Risk  Factors:Hypertension and Dyslipidemia.  Sonographer:    Johny Chess RDCS Referring Phys: [id number] Letitia Libra A WILMOT IMPRESSIONS  1. Left ventricular ejection fraction, by estimation, is 35 to 40%. The left ventricle has moderately decreased function. The left ventricle demonstrates regional wall motion abnormalities (see scoring diagram/findings for description). Left ventricular  diastolic parameters are consistent with Grade III diastolic dysfunction (restrictive).  2. Right ventricular systolic function is normal. The right ventricular size is normal. There is moderately elevated pulmonary artery systolic pressure. The estimated right ventricular systolic pressure is 123XX123 mmHg.  3. A small pericardial effusion is present. The pericardial effusion is posterior and lateral to the left ventricle. There is no evidence of cardiac tamponade.  4. The mitral valve is degenerative. Mild to moderate mitral valve regurgitation. No evidence of mitral stenosis.  5. Tricuspid valve regurgitation is moderate to severe.  6. The aortic valve is tricuspid with moderate calcifications. V max 2.1 m/s, MG 10 mmHG, EOA 1.24 cm2, DI 0.39. SV noted to be low in setting of reduced EF. Suspect this is moderate aortic stenosis is present similar to prior echo. The aortic valve is tricuspid. There is moderate calcification of the aortic valve. There is moderate thickening of the aortic valve. Aortic valve regurgitation is mild. Moderate aortic valve stenosis.  7. The inferior vena cava is normal in size with greater than 50% respiratory variability, suggesting right atrial pressure of 3 mmHg. Comparison(s): Changes from prior study are noted. EF is now 35-40%. WMA concerning for LAD infarction. FINDINGS  Left Ventricle: Left ventricular ejection fraction, by estimation, is 35 to 40%. The left ventricle has moderately decreased function. The left ventricle demonstrates regional wall motion abnormalities. The left ventricular internal  cavity size was normal in size. There is no left ventricular hypertrophy. Left ventricular diastolic parameters are consistent with Grade III diastolic dysfunction (restrictive).  LV Wall Scoring: The apical septal segment,  apical inferior segment, and apex are akinetic. Right Ventricle: The right ventricular size is normal. No increase in right ventricular wall thickness. Right ventricular systolic function is normal. There is moderately elevated pulmonary artery systolic pressure. The tricuspid regurgitant velocity is 3.54 m/s, and with an assumed right atrial pressure of 3 mmHg, the estimated right ventricular systolic pressure is 123XX123 mmHg. Pericardium: A small pericardial effusion is present. The pericardial effusion is posterior and lateral to the left ventricle. There is no evidence of cardiac tamponade. Presence of pericardial fat pad. Mitral Valve: The mitral valve is degenerative in appearance. Mild to moderate mitral annular calcification. Mild to moderate mitral valve regurgitation. No evidence of mitral valve stenosis. Tricuspid Valve: Tricuspid valve regurgitation is moderate to severe. Aortic Valve: The aortic valve is tricuspid with moderate calcifications. V max 2.1 m/s, MG 10 mmHG, EOA 1.24 cm2, DI 0.39. SV noted to be low in setting of reduced EF. Suspect this is moderate aortic stenosis is present similar to prior echo. The aortic  valve is tricuspid. There is moderate calcification of the aortic valve. There is moderate thickening of the aortic valve. Aortic valve regurgitation is mild. Aortic regurgitation PHT measures 342 msec. Moderate aortic stenosis is present. Aortic valve mean gradient measures 10.0 mmHg. Aortic valve peak gradient measures 18.7 mmHg. Aortic valve area, by VTI measures 1.24 cm. Aorta: The aortic root and ascending aorta are structurally normal, with no evidence of dilitation. Venous: The inferior vena cava is normal in size with greater than 50% respiratory variability,  suggesting right atrial pressure of 3 mmHg. Additional Comments: A device lead is visualized in the right atrium and right ventricle. LEFT VENTRICLE PLAX 2D LVIDd:         5.10 cm     Diastology LVIDs:         3.90 cm     LV e' medial:    4.79 cm/s LV PW:         1.10 cm     LV E/e' medial:  26.5 LV IVS:        1.00 cm     LV e' lateral:   5.00 cm/s LVOT diam:     2.00 cm     LV E/e' lateral: 25.4 LV SV:         55 LV SV Index:   29 LVOT Area:     3.14 cm  LV Volumes (MOD) LV vol d, MOD A2C: 79.8 ml LV vol d, MOD A4C: 82.4 ml LV vol s, MOD A2C: 46.1 ml LV vol s, MOD A4C: 52.2 ml LV SV MOD A2C:     33.7 ml LV SV MOD A4C:     82.4 ml LV SV MOD BP:      32.5 ml IVC IVC diam: 2.00 cm LEFT ATRIUM         Index LA diam:    4.50 cm 2.41 cm/m  AORTIC VALVE AV Area (Vmax):    1.10 cm AV Area (Vmean):   1.07 cm AV Area (VTI):     1.24 cm AV Vmax:           216.00 cm/s AV Vmean:          147.000 cm/s AV VTI:            0.445 m AV Peak Grad:      18.7 mmHg AV Mean Grad:      10.0 mmHg LVOT Vmax:         75.80 cm/s  LVOT Vmean:        50.100 cm/s LVOT VTI:          0.175 m LVOT/AV VTI ratio: 0.39 AI PHT:            342 msec  AORTA Ao Asc diam: 2.90 cm MITRAL VALVE                TRICUSPID VALVE MV Area (PHT): 3.51 cm     TR Peak grad:   50.1 mmHg MV Decel Time: 216 msec     TR Vmax:        354.00 cm/s MV E velocity: 127.00 cm/s MV A velocity: 49.40 cm/s   SHUNTS MV E/A ratio:  2.57         Systemic VTI:  0.18 m                             Systemic Diam: 2.00 cm Eleonore Chiquito MD Electronically signed by Eleonore Chiquito MD Signature Date/Time: 01/31/2021/5:11:08 PM    Final      Patient Profile     86 y.o. male with past medical history of paroxysmal atrial fibrillation, chronic diastolic congestive heart failure, chronic stage IIIa kidney disease, nonobstructive coronary disease admitted with unstable angina.  Cardiac catheterization 2018 showed 60% right, 50% LAD and 60% PDA.  Medical therapy recommended.  Last  echocardiogram March 2022 showed normal LV function, moderate pulmonary hypertension, moderate left atrial enlargement, mild to moderate mitral regurgitation, mild to moderate tricuspid regurgitation, mild to moderate aortic insufficiency, moderate aortic stenosis with mean gradient 21 mmHg.  Assessment & Plan    1 chest pain-symptoms somewhat atypical and no clear trend with troponin.  However LV function decreased compared to previous.  Continue aspirin, nitroglycerin, heparin and statin.  Continue beta-blocker.  We will plan to proceed with cardiac catheterization tomorrow assuming his fevers have resolved.  The risk and benefits including myocardial infarction, CVA and death discussed and he agrees to proceed.  We will not hydrate prior to procedure as he has symptoms of CHF.   2 aortic stenosis-moderate on follow-up echocardiogram.  Will need follow-up as an outpatient.  3 chronic diastolic congestive heart failure-patient describes some dyspnea this morning.  We will give 1 dose of Lasix 40 mg.  Will not hydrate prior to catheterization.  4 chronic stage IIIa kidney disease-recheck renal function tomorrow morning prior to catheterization.  5 paroxysmal atrial fibrillation-patient remains in sinus rhythm.  Continue beta-blocker.  Resume apixaban once all procedures complete.  Continue heparin for now.  6 hypertension-metoprolol recently added.  Follow blood pressure and adjust medications as needed.  7 prior pacemaker  8 low-grade fever-etiology unclear.  There are no localizing symptoms including no productive cough, dysuria, diarrhea.  Will check urinalysis and repeat chest x-ray.  For questions or updates, please contact [place] Please consult [email or web address] for contact info under        Signed, Kirk Ruths, MD  02/01/2021, 8:02 AM

## 2021-02-02 ENCOUNTER — Inpatient Hospital Stay (HOSPITAL_COMMUNITY)
Admission: EM | Disposition: A | Discharge status: Home / Self Care | Payer: Self-pay | Source: Home / Self Care | Attending: Cardiology

## 2021-02-02 ENCOUNTER — Encounter (HOSPITAL_COMMUNITY): Payer: Self-pay | Admitting: Internal Medicine

## 2021-02-02 ENCOUNTER — Telehealth: Payer: Self-pay | Admitting: Cardiology

## 2021-02-02 DIAGNOSIS — I5032 Chronic diastolic (congestive) heart failure: Secondary | ICD-10-CM | POA: Diagnosis not present

## 2021-02-02 DIAGNOSIS — I2510 Atherosclerotic heart disease of native coronary artery without angina pectoris: Secondary | ICD-10-CM | POA: Diagnosis not present

## 2021-02-02 DIAGNOSIS — I5023 Acute on chronic systolic (congestive) heart failure: Secondary | ICD-10-CM | POA: Diagnosis not present

## 2021-02-02 DIAGNOSIS — I214 Non-ST elevation (NSTEMI) myocardial infarction: Secondary | ICD-10-CM | POA: Diagnosis not present

## 2021-02-02 DIAGNOSIS — I480 Paroxysmal atrial fibrillation: Secondary | ICD-10-CM | POA: Diagnosis not present

## 2021-02-02 DIAGNOSIS — N183 Chronic kidney disease, stage 3 unspecified: Secondary | ICD-10-CM | POA: Diagnosis not present

## 2021-02-02 DIAGNOSIS — I25110 Atherosclerotic heart disease of native coronary artery with unstable angina pectoris: Secondary | ICD-10-CM | POA: Diagnosis not present

## 2021-02-02 DIAGNOSIS — Z7901 Long term (current) use of anticoagulants: Secondary | ICD-10-CM

## 2021-02-02 HISTORY — PX: RIGHT/LEFT HEART CATH AND CORONARY ANGIOGRAPHY: CATH118266

## 2021-02-02 LAB — POCT I-STAT 7, (LYTES, BLD GAS, ICA,H+H)
Acid-Base Excess: 1 mmol/L (ref 0.0–2.0)
Bicarbonate: 26.3 mmol/L (ref 20.0–28.0)
Calcium, Ion: 1.18 mmol/L (ref 1.15–1.40)
HCT: 30 % — ABNORMAL LOW (ref 39.0–52.0)
Hemoglobin: 10.2 g/dL — ABNORMAL LOW (ref 13.0–17.0)
O2 Saturation: 93 %
Potassium: 3.9 mmol/L (ref 3.5–5.1)
Sodium: 135 mmol/L (ref 135–145)
TCO2: 28 mmol/L (ref 22–32)
pCO2 arterial: 42.2 mmHg (ref 32.0–48.0)
pH, Arterial: 7.403 (ref 7.350–7.450)
pO2, Arterial: 68 mmHg — ABNORMAL LOW (ref 83.0–108.0)

## 2021-02-02 LAB — POCT I-STAT EG7
Acid-Base Excess: 2 mmol/L (ref 0.0–2.0)
Bicarbonate: 27.9 mmol/L (ref 20.0–28.0)
Calcium, Ion: 1.19 mmol/L (ref 1.15–1.40)
HCT: 32 % — ABNORMAL LOW (ref 39.0–52.0)
Hemoglobin: 10.9 g/dL — ABNORMAL LOW (ref 13.0–17.0)
O2 Saturation: 60 %
Potassium: 4 mmol/L (ref 3.5–5.1)
Sodium: 135 mmol/L (ref 135–145)
TCO2: 29 mmol/L (ref 22–32)
pCO2, Ven: 46.9 mmHg (ref 44.0–60.0)
pH, Ven: 7.383 (ref 7.250–7.430)
pO2, Ven: 32 mmHg (ref 32.0–45.0)

## 2021-02-02 LAB — CBC
HCT: 31.5 % — ABNORMAL LOW (ref 39.0–52.0)
Hemoglobin: 10.5 g/dL — ABNORMAL LOW (ref 13.0–17.0)
MCH: 31.1 pg (ref 26.0–34.0)
MCHC: 33.3 g/dL (ref 30.0–36.0)
MCV: 93.2 fL (ref 80.0–100.0)
Platelets: 208 10*3/uL (ref 150–400)
RBC: 3.38 MIL/uL — ABNORMAL LOW (ref 4.22–5.81)
RDW: 12.6 % (ref 11.5–15.5)
WBC: 6.9 10*3/uL (ref 4.0–10.5)
nRBC: 0 % (ref 0.0–0.2)

## 2021-02-02 LAB — BASIC METABOLIC PANEL
Anion gap: 8 (ref 5–15)
BUN: 19 mg/dL (ref 8–23)
CO2: 28 mmol/L (ref 22–32)
Calcium: 8.8 mg/dL — ABNORMAL LOW (ref 8.9–10.3)
Chloride: 96 mmol/L — ABNORMAL LOW (ref 98–111)
Creatinine, Ser: 1.43 mg/dL — ABNORMAL HIGH (ref 0.61–1.24)
GFR, Estimated: 49 mL/min — ABNORMAL LOW (ref >60)
Glucose, Bld: 99 mg/dL (ref 70–99)
Potassium: 4 mmol/L (ref 3.5–5.1)
Sodium: 132 mmol/L — ABNORMAL LOW (ref 135–145)

## 2021-02-02 LAB — HEPARIN LEVEL (UNFRACTIONATED): Heparin Unfractionated: >1.1 IU/mL — ABNORMAL HIGH (ref 0.30–0.70)

## 2021-02-02 LAB — APTT: aPTT: 80 seconds — ABNORMAL HIGH (ref 24–36)

## 2021-02-02 SURGERY — RIGHT/LEFT HEART CATH AND CORONARY ANGIOGRAPHY
Anesthesia: LOCAL

## 2021-02-02 MED ORDER — HEPARIN SODIUM (PORCINE) 1000 UNIT/ML IJ SOLN
INTRAMUSCULAR | Status: DC | PRN
  Administered 2021-02-02: 4000 [IU] via INTRAVENOUS

## 2021-02-02 MED ORDER — HEPARIN (PORCINE) IN NACL 1000-0.9 UT/500ML-% IV SOLN
INTRAVENOUS | Status: AC
  Filled 2021-02-02: qty 500

## 2021-02-02 MED ORDER — LIDOCAINE HCL (PF) 1 % IJ SOLN
INTRAMUSCULAR | Status: AC
  Filled 2021-02-02: qty 30

## 2021-02-02 MED ORDER — HYDRALAZINE HCL 20 MG/ML IJ SOLN: 10.0000 mg | INTRAMUSCULAR | Status: AC | PRN

## 2021-02-02 MED ORDER — HEPARIN (PORCINE) IN NACL 1000-0.9 UT/500ML-% IV SOLN
INTRAVENOUS | Status: DC | PRN
  Administered 2021-02-02 (×2): 500 mL

## 2021-02-02 MED ORDER — VERAPAMIL HCL 2.5 MG/ML IV SOLN
INTRAVENOUS | Status: DC | PRN
  Administered 2021-02-02: 10 mL via INTRA_ARTERIAL

## 2021-02-02 MED ORDER — VERAPAMIL HCL 2.5 MG/ML IV SOLN
INTRAVENOUS | Status: AC
  Filled 2021-02-02: qty 2

## 2021-02-02 MED ORDER — SODIUM CHLORIDE 0.9% FLUSH: 3.0000 mL | INTRAVENOUS | Status: DC | PRN

## 2021-02-02 MED ORDER — FENTANYL CITRATE (PF) 100 MCG/2ML IJ SOLN
INTRAMUSCULAR | Status: DC | PRN
  Administered 2021-02-02 (×2): 12.5 ug via INTRAVENOUS

## 2021-02-02 MED ORDER — FUROSEMIDE 10 MG/ML IJ SOLN
40.0000 mg | Freq: Every day | INTRAMUSCULAR | Status: DC
  Administered 2021-02-02 – 2021-02-03 (×2): 40 mg via INTRAVENOUS
  Filled 2021-02-02 (×2): qty 4

## 2021-02-02 MED ORDER — SODIUM CHLORIDE 0.9% FLUSH
3.0000 mL | Freq: Two times a day (BID) | INTRAVENOUS | Status: DC
  Administered 2021-02-02 – 2021-02-04 (×4): 3 mL via INTRAVENOUS

## 2021-02-02 MED ORDER — HEPARIN (PORCINE) 25000 UT/250ML-% IV SOLN
1150.0000 [IU]/h | INTRAVENOUS | Status: DC
  Administered 2021-02-02: 900 [IU]/h via INTRAVENOUS
  Administered 2021-02-03: 1100 [IU]/h via INTRAVENOUS
  Administered 2021-02-04: 1150 [IU]/h via INTRAVENOUS
  Filled 2021-02-02 (×2): qty 250

## 2021-02-02 MED ORDER — NICOTINE 14 MG/24HR TD PT24
14.0000 mg | MEDICATED_PATCH | Freq: Every day | TRANSDERMAL | Status: DC
  Administered 2021-02-02 – 2021-02-05 (×4): 14 mg via TRANSDERMAL
  Filled 2021-02-02 (×4): qty 1

## 2021-02-02 MED ORDER — IOHEXOL 350 MG/ML SOLN
INTRAVENOUS | Status: DC | PRN
  Administered 2021-02-02: 35 mL

## 2021-02-02 MED ORDER — MIDAZOLAM HCL 2 MG/2ML IJ SOLN
INTRAMUSCULAR | Status: AC
  Filled 2021-02-02: qty 2

## 2021-02-02 MED ORDER — LORAZEPAM 0.5 MG PO TABS
0.5000 mg | ORAL_TABLET | Freq: Three times a day (TID) | ORAL | Status: DC | PRN
  Administered 2021-02-02 – 2021-02-03 (×3): 0.5 mg via ORAL
  Filled 2021-02-02 (×4): qty 1

## 2021-02-02 MED ORDER — LIDOCAINE HCL (PF) 1 % IJ SOLN
INTRAMUSCULAR | Status: DC | PRN
  Administered 2021-02-02 (×2): 2 mL

## 2021-02-02 MED ORDER — FENTANYL CITRATE (PF) 100 MCG/2ML IJ SOLN
INTRAMUSCULAR | Status: AC
  Filled 2021-02-02: qty 2

## 2021-02-02 MED ORDER — FENTANYL CITRATE (PF) 100 MCG/2ML IJ SOLN
12.5000 ug | Freq: Once | INTRAMUSCULAR | Status: AC
  Administered 2021-02-02: 12.5 ug via INTRAVENOUS
  Filled 2021-02-02: qty 2

## 2021-02-02 MED ORDER — LABETALOL HCL 5 MG/ML IV SOLN: 10.0000 mg | INTRAVENOUS | Status: AC | PRN

## 2021-02-02 MED ORDER — SODIUM CHLORIDE 0.9 % IV SOLN: 250.0000 mL | INTRAVENOUS | Status: DC | PRN

## 2021-02-02 MED ORDER — MIDAZOLAM HCL 2 MG/2ML IJ SOLN
INTRAMUSCULAR | Status: DC | PRN
  Administered 2021-02-02: 0.5 mg via INTRAVENOUS

## 2021-02-02 SURGICAL SUPPLY — 14 items
CATH BALLN WEDGE 5F 110CM (CATHETERS) ×2 IMPLANT
CATH INFINITI 5FR JK (CATHETERS) ×2 IMPLANT
CATH LAUNCHER 5F EBU3.0 (CATHETERS) ×1 IMPLANT
CATHETER LAUNCHER 5F EBU3.0 (CATHETERS) ×2
DEVICE RAD COMP TR BAND LRG (VASCULAR PRODUCTS) ×2 IMPLANT
GLIDESHEATH SLEND SS 6F .021 (SHEATH) ×2 IMPLANT
GUIDEWIRE INQWIRE 1.5J.035X260 (WIRE) ×1 IMPLANT
INQWIRE 1.5J .035X260CM (WIRE) ×2
KIT HEART LEFT (KITS) ×2 IMPLANT
PACK CARDIAC CATHETERIZATION (CUSTOM PROCEDURE TRAY) ×2 IMPLANT
SHEATH GLIDE SLENDER 4/5FR (SHEATH) ×2 IMPLANT
TRANSDUCER W/STOPCOCK (MISCELLANEOUS) ×2 IMPLANT
TUBING CIL FLEX 10 FLL-RA (TUBING) ×2 IMPLANT
WIRE HI TORQ VERSACORE-J 145CM (WIRE) ×2 IMPLANT

## 2021-02-02 NOTE — Progress Notes (Signed)
TCTS consulted for CABG evaluation. °

## 2021-02-02 NOTE — Progress Notes (Signed)
[place] for Heparin (Apixaban on hold) Indication: chest pain/ACS and atrial fibrillation  No Known Allergies  Patient Measurements: Height: '5\' 2"'$  (157.5 cm) Weight: 83.1 kg (183 lb 3.2 oz) IBW/kg (Calculated) : 54.6   Vital Signs: Temp: 98.6 F (37 C) (08/15 0415) Temp Source: Oral (08/15 0415) BP: 145/66 (08/15 0415) Pulse Rate: 64 (08/15 0415)  Labs: Recent Labs    01/30/21 2042 01/30/21 2042 01/30/21 2315 01/31/21 0758 01/31/21 1656 02/01/21 0222 02/02/21 0200  HGB 11.5*  --   --  11.6*  --  11.5* 10.5*  HCT 34.6*  --   --  34.4*  --  34.8* 31.5*  PLT 193  --   --  203  --  215 208  APTT  --    < >  --  75* 101* 66* 80*  HEPARINUNFRC  --   --   --  >1.10*  --  >1.10* >1.10*  CREATININE 1.30*  --   --   --   --  1.31* 1.43*  TROPONINIHS 1,689*  --  1,730*  --   --   --   --    < > = values in this interval not displayed.     Estimated Creatinine Clearance: 36.5 mL/min (A) (by C-G formula based on SCr of 1.43 mg/dL (H)).   Medical History: Past Medical History:  Diagnosis Date   Acute diastolic CHF (congestive heart failure) ([place]) 04/12/2019   Acute on chronic combined systolic and diastolic CHF (congestive heart failure) ([place]) 04/09/2019   Arthritis    Asthma    BMI 32.0-32.9,adult 04/17/2020   Chronic anemia    CKD (chronic kidney disease), stage III (HCC)    Complete atrioventricular block ([place]) 04/01/2015   Complete heart block ([place]) 04/01/2015   Coronary artery disease    a. moderate nonobstructive CAD by cath in 2018.   Coronary atherosclerosis of native coronary artery 04/09/2019   Mid RCA lesion, 60 %stenosed. Prox LAD lesion, 40 %stenosed. Prox LAD to Mid LAD lesion, 50 %stenosed. LPDA lesion, 60 %stenosed.   Disorder of carotid artery ([place]) 12/29/2016   Dyslipidemia 12/29/2016   Essential hypertension, benign 08/15/2017   Exertional angina ([place]) 02/08/2017   Gastritis and gastroduodenitis    GERD  (gastroesophageal reflux disease)    Hypothyroidism    Iron deficiency anemia    Late effect of cerebrovascular accident (CVA) 05/25/2017   Late effects of cerebrovascular disease 05/25/2017   Malignant neoplasm of prostate ([place]) 08/17/2019   Mixed hyperlipidemia 12/14/2019   Obesity 01/25/2018   Osteoarthritis of both knees 08/17/2019   Pacemaker 08/08/2019   PAF (paroxysmal atrial fibrillation) (HCC)    Paroxysmal atrial fibrillation ([place]) 04/01/2015   Peripheral vascular disease ([place])    Polyp of cecum    Precordial pain 04/23/2015   Preinfarction syndrome ([place]) 02/08/2017   Right-sided carotid artery disease ([place]) 12/29/2016   Routine general medical examination at a health care facility 04/01/2015   [place] of cerebrovascular accident 05/25/2017   Symptomatic anemia 04/12/2019   Assessment: 86 y/o M who presents to the ED with several days of chest pain, troponin is elevated. On apixaban PTA for afib, last dose >12 hours prior to arrival (01/30/21 '@0900'$ ). Cardiology planning for possible LHC on Monday.  Heparin level was  >1.10 elevated from apixaban use  - not reflective of anticoagulation and not correlating with aPTT. aPTT is therapeutic at 80sec CBC stable. no s/sx of bleeding or infusion issues.   Goal  of Therapy:  Heparin level 0.3-0.7 units/ml aPTT 66-102 secs Monitor platelets by anticoagulation protocol: Yes   Plan:  Continue heparin drip at 1000 units/hr aPTT monitoring until heparin level is correlated Daily CBC/Heparin level/aPTT F/up: transition back to apixaban     Bonnita Nasuti Pharm.D. CPP, BCPS Clinical Pharmacist [phone] 02/02/2021 7:49 AM

## 2021-02-02 NOTE — Progress Notes (Signed)
Progress Note  Patient Name: [name] Date of Encounter: 02/02/2021  [facility] Cardiologist: Jenne Campus, MD   Subjective   Denies CP or dyspnea  Inpatient Medications    Scheduled Meds:  amLODipine  5 mg Oral QHS   aspirin EC  81 mg Oral Daily   atorvastatin  40 mg Oral Daily   budesonide  0.5 mg Inhalation BID   ferrous sulfate  325 mg Oral Q breakfast   levothyroxine  50 mcg Oral Daily   metoprolol tartrate  25 mg Oral BID   multivitamin with minerals  1 tablet Oral Daily   pantoprazole  40 mg Oral Daily   ranolazine  500 mg Oral BID   sodium chloride flush  3 mL Intravenous Q12H   vitamin B-12  1,000 mcg Oral Daily   Continuous Infusions:  sodium chloride     sodium chloride 10 mL/hr at 02/02/21 0606   heparin 1,000 Units/hr (02/02/21 0606)   nitroGLYCERIN 15 mcg/min (02/02/21 0606)   PRN Meds: sodium chloride, acetaminophen, albuterol, HYDROcodone-acetaminophen, ondansetron (ZOFRAN) IV, sodium chloride flush   Vital Signs    Vitals:   02/01/21 1945 02/01/21 2021 02/02/21 0415 02/02/21 0748  BP: (!) 141/61  (!) 145/66 137/64  Pulse: 65  64 80  Resp: '18  18 20  '$ Temp: 98.6 F (37 C)  98.6 F (37 C) 98.6 F (37 C)  TempSrc: Oral  Oral Oral  SpO2: 99% 99% 97% 100%  Weight:   83.1 kg   Height:        Intake/Output Summary (Last 24 hours) at 02/02/2021 0755 Last data filed at 02/02/2021 0606 Gross per 24 hour  Intake 684.3 ml  Output 1925 ml  Net -1240.7 ml    Last 3 Weights 02/02/2021 02/01/2021 01/30/2021  Weight (lbs) 183 lb 3.2 oz 186 lb 11.2 oz 188 lb 15 oz  Weight (kg) 83.099 kg 84.687 kg 85.7 kg     Telemetry-ventricular paced; personally reviewed.  Physical Exam   GEN: NAD Neck: supple, no adenopathy Cardiac: RRR, 2/6 systolic murmur LSB; no DM, no gallop Respiratory: Diminished breath sounds left lower lobe; no wheeze GI: Soft, NT/ND, no masses MS: No edema Neuro:  No focal findings Psych: Normal affect   Labs     High Sensitivity Troponin:   Recent Labs  Lab 01/30/21 2042 01/30/21 2315  TROPONINIHS 1,689* 1,730*       Chemistry Recent Labs  Lab 01/30/21 2042 02/01/21 0222 02/02/21 0200  NA 128* 133* 132*  K 3.6 4.0 4.0  CL 99 97* 96*  CO2 18* 24 28  GLUCOSE 115* 107* 99  BUN '13 15 19  '$ CREATININE 1.30* 1.31* 1.43*  CALCIUM 7.9* 8.8* 8.8*  PROT 6.2*  --   --   ALBUMIN 3.2*  --   --   AST 30  --   --   ALT 17  --   --   ALKPHOS 62  --   --   BILITOT 0.8  --   --   GFRNONAA 55* 54* 49*  ANIONGAP '11 12 8      '$ Hematology Recent Labs  Lab 01/31/21 0758 02/01/21 0222 02/02/21 0200  WBC 8.2 10.6* 6.9  RBC 3.71* 3.72* 3.38*  HGB 11.6* 11.5* 10.5*  HCT 34.4* 34.8* 31.5*  MCV 92.7 93.5 93.2  MCH 31.3 30.9 31.1  MCHC 33.7 33.0 33.3  RDW 13.0 12.9 12.6  PLT 203 215 208     BNP Recent Labs  Lab 01/30/21 2042  BNP 735.1*      Radiology    DG Chest Port 1V same Day  Result Date: 02/01/2021 CLINICAL DATA:  Shortness of breath EXAM: PORTABLE CHEST 1 VIEW COMPARISON:  January 30, 2021 FINDINGS: Stable cardiomegaly and pacemaker. No pneumothorax. Mild patchy opacity in the right perihilar region. No overt edema. No other acute abnormalities. IMPRESSION: New mild patchy opacity in the right lung is concerning for developing infiltrate. Recommend clinical correlation and attention on follow-up. No other interval changes. Electronically Signed   By: Dorise Bullion III M.D.   On: 02/01/2021 09:11   ECHOCARDIOGRAM LIMITED  Result Date: 01/31/2021    ECHOCARDIOGRAM LIMITED REPORT   Patient Name:   [name] Date of Exam: 01/31/2021 Medical Rec #:  [id number]        Height:       62.0 in Accession #:    [id number]       Weight:       188.9 lb Date of Birth:  [birth year]        BSA:          1.866 m Patient Age:    86 years         BP:           124/67 mmHg Patient Gender: M                HR:           68 bpm. Exam Location:  Inpatient Procedure: Limited Echo, Color Doppler and  Cardiac Doppler Indications:    acute myocardial infarction  History:        Patient has prior history of Echocardiogram examinations, most                 recent 08/27/2020. Chronic kidney disease, Arrythmias:complete                 heart block; Risk Factors:Hypertension and Dyslipidemia.  Sonographer:    Johny Chess RDCS Referring Phys: [id number] Letitia Libra A WILMOT IMPRESSIONS  1. Left ventricular ejection fraction, by estimation, is 35 to 40%. The left ventricle has moderately decreased function. The left ventricle demonstrates regional wall motion abnormalities (see scoring diagram/findings for description). Left ventricular  diastolic parameters are consistent with Grade III diastolic dysfunction (restrictive).  2. Right ventricular systolic function is normal. The right ventricular size is normal. There is moderately elevated pulmonary artery systolic pressure. The estimated right ventricular systolic pressure is 123XX123 mmHg.  3. A small pericardial effusion is present. The pericardial effusion is posterior and lateral to the left ventricle. There is no evidence of cardiac tamponade.  4. The mitral valve is degenerative. Mild to moderate mitral valve regurgitation. No evidence of mitral stenosis.  5. Tricuspid valve regurgitation is moderate to severe.  6. The aortic valve is tricuspid with moderate calcifications. V max 2.1 m/s, MG 10 mmHG, EOA 1.24 cm2, DI 0.39. SV noted to be low in setting of reduced EF. Suspect this is moderate aortic stenosis is present similar to prior echo. The aortic valve is tricuspid. There is moderate calcification of the aortic valve. There is moderate thickening of the aortic valve. Aortic valve regurgitation is mild. Moderate aortic valve stenosis.  7. The inferior vena cava is normal in size with greater than 50% respiratory variability, suggesting right atrial pressure of 3 mmHg. Comparison(s): Changes from prior study are noted. EF is now 35-40%. WMA concerning for LAD  infarction. FINDINGS  Left Ventricle: Left ventricular  ejection fraction, by estimation, is 35 to 40%. The left ventricle has moderately decreased function. The left ventricle demonstrates regional wall motion abnormalities. The left ventricular internal cavity size was normal in size. There is no left ventricular hypertrophy. Left ventricular diastolic parameters are consistent with Grade III diastolic dysfunction (restrictive).  LV Wall Scoring: The apical septal segment, apical inferior segment, and apex are akinetic. Right Ventricle: The right ventricular size is normal. No increase in right ventricular wall thickness. Right ventricular systolic function is normal. There is moderately elevated pulmonary artery systolic pressure. The tricuspid regurgitant velocity is 3.54 m/s, and with an assumed right atrial pressure of 3 mmHg, the estimated right ventricular systolic pressure is 123XX123 mmHg. Pericardium: A small pericardial effusion is present. The pericardial effusion is posterior and lateral to the left ventricle. There is no evidence of cardiac tamponade. Presence of pericardial fat pad. Mitral Valve: The mitral valve is degenerative in appearance. Mild to moderate mitral annular calcification. Mild to moderate mitral valve regurgitation. No evidence of mitral valve stenosis. Tricuspid Valve: Tricuspid valve regurgitation is moderate to severe. Aortic Valve: The aortic valve is tricuspid with moderate calcifications. V max 2.1 m/s, MG 10 mmHG, EOA 1.24 cm2, DI 0.39. SV noted to be low in setting of reduced EF. Suspect this is moderate aortic stenosis is present similar to prior echo. The aortic  valve is tricuspid. There is moderate calcification of the aortic valve. There is moderate thickening of the aortic valve. Aortic valve regurgitation is mild. Aortic regurgitation PHT measures 342 msec. Moderate aortic stenosis is present. Aortic valve mean gradient measures 10.0 mmHg. Aortic valve peak gradient  measures 18.7 mmHg. Aortic valve area, by VTI measures 1.24 cm. Aorta: The aortic root and ascending aorta are structurally normal, with no evidence of dilitation. Venous: The inferior vena cava is normal in size with greater than 50% respiratory variability, suggesting right atrial pressure of 3 mmHg. Additional Comments: A device lead is visualized in the right atrium and right ventricle. LEFT VENTRICLE PLAX 2D LVIDd:         5.10 cm     Diastology LVIDs:         3.90 cm     LV e' medial:    4.79 cm/s LV PW:         1.10 cm     LV E/e' medial:  26.5 LV IVS:        1.00 cm     LV e' lateral:   5.00 cm/s LVOT diam:     2.00 cm     LV E/e' lateral: 25.4 LV SV:         55 LV SV Index:   29 LVOT Area:     3.14 cm  LV Volumes (MOD) LV vol d, MOD A2C: 79.8 ml LV vol d, MOD A4C: 82.4 ml LV vol s, MOD A2C: 46.1 ml LV vol s, MOD A4C: 52.2 ml LV SV MOD A2C:     33.7 ml LV SV MOD A4C:     82.4 ml LV SV MOD BP:      32.5 ml IVC IVC diam: 2.00 cm LEFT ATRIUM         Index LA diam:    4.50 cm 2.41 cm/m  AORTIC VALVE AV Area (Vmax):    1.10 cm AV Area (Vmean):   1.07 cm AV Area (VTI):     1.24 cm AV Vmax:           216.00 cm/s AV  Vmean:          147.000 cm/s AV VTI:            0.445 m AV Peak Grad:      18.7 mmHg AV Mean Grad:      10.0 mmHg LVOT Vmax:         75.80 cm/s LVOT Vmean:        50.100 cm/s LVOT VTI:          0.175 m LVOT/AV VTI ratio: 0.39 AI PHT:            342 msec  AORTA Ao Asc diam: 2.90 cm MITRAL VALVE                TRICUSPID VALVE MV Area (PHT): 3.51 cm     TR Peak grad:   50.1 mmHg MV Decel Time: 216 msec     TR Vmax:        354.00 cm/s MV E velocity: 127.00 cm/s MV A velocity: 49.40 cm/s   SHUNTS MV E/A ratio:  2.57         Systemic VTI:  0.18 m                             Systemic Diam: 2.00 cm Eleonore Chiquito MD Electronically signed by Eleonore Chiquito MD Signature Date/Time: 01/31/2021/5:11:08 PM    Final      Patient Profile     86 y.o. male with past medical history of paroxysmal atrial  fibrillation, chronic diastolic congestive heart failure, chronic stage IIIa kidney disease, nonobstructive coronary disease admitted with unstable angina.  Cardiac catheterization 2018 showed 60% right, 50% LAD and 60% PDA.  Medical therapy recommended.  Last echocardiogram March 2022 showed normal LV function, moderate pulmonary hypertension, moderate left atrial enlargement, mild to moderate mitral regurgitation, mild to moderate tricuspid regurgitation, mild to moderate aortic insufficiency, moderate aortic stenosis with mean gradient 21 mmHg.  Assessment & Plan    1 chest pain-patient denies recurrent chest pain.  Previous symptoms were somewhat atypical and no clear trend.  However LV function is reduced compared to previous.  Plan is for right and left cardiac catheterization today.  The risks and benefits including myocardial infarction, CVA and death previously discussed and he agrees to proceed.  I have not hydrated prior to procedure given symptoms of CHF.  We will hold on further diuresis.  Continue aspirin, nitroglycerin, heparin and statin.  Continue beta-blocker.    2 aortic stenosis-moderate on follow-up echocardiogram.  Will need follow-up as an outpatient.  Plan right heart catheterization as outlined above.  3 chronic diastolic congestive heart failure-patient appears to be euvolemic today.  I would not diurese further.  4 chronic stage IIIa kidney disease-hold further diuretics prior to catheterization.  Follow renal function after procedure.  5 paroxysmal atrial fibrillation-Continue beta-blocker.  Resume apixaban once all procedures complete.  Continue heparin for now.  6 hypertension-metoprolol recently added.  Follow blood pressure and adjust medications as needed.  7 prior pacemaker  8 low-grade fever-resolved.  Urinalysis unremarkable.  Chest x-ray with question right hilar infiltrate.  We will plan repeat PA and lateral chest x-ray once catheterization complete.  He does  not have symptoms of pneumonia including no productive cough.    9 Reduced LV function-will DC amlodipine and treat with entresto and jardiance following cath once renal function stable; transition to toprol prior to DC.  For questions or updates, please contact  [facility] Please consult [email or web address] for contact info under        Signed, Kirk Ruths, MD  02/02/2021, 7:55 AM

## 2021-02-02 NOTE — Interval H&P Note (Signed)
History and Physical Interval Note:  02/02/2021 9:13 AM  [name]  has presented today for surgery, with the diagnosis of NSTEMI.  The various methods of treatment have been discussed with the patient and family. After consideration of risks, benefits and other options for treatment, the patient has consented to  Procedure(s): RIGHT/LEFT HEART CATH AND CORONARY ANGIOGRAPHY (N/A) as a surgical intervention.  The patient's history has been reviewed, patient examined, no change in status, stable for surgery.  I have reviewed the patient's chart and labs.  Questions were answered to the patient's satisfaction.    Cath Lab Visit (complete for each Cath Lab visit)  Clinical Evaluation Leading to the Procedure:   ACS: Yes.    Non-ACS:  N/A  [name]

## 2021-02-02 NOTE — Telephone Encounter (Signed)
Patient's son states the patient has been in the hospital since Friday and would like Dr. Agustin Cree to be involved in his care. Phone: [phone]

## 2021-02-02 NOTE — Consult Note (Signed)
[address]       [place] [address]             [id number]        [name] [facility] Record [id number] Date of Birth: 20-Oct-1934  Referring: No ref. provider found Primary Care: Lillard Anes, MD Primary Cardiologist:Robert Agustin Cree, MD  Chief Complaint:    Chief Complaint  Patient presents with   Chest Pain    History of Present Illness:      [name] is an 86 year old male patient with past medical history significant for chronic diastolic congestive heart failure, chronic stage IIIa kidney disease, paroxysmal atrial fibrillation on Eliquis, heart block status post pacemaker, CAD, asthma, obesity, hyperlipidemia, and hypertension who presented to the emergency department on 8/12 with a complaint of chest pain and shortness of breath.  The patient reported that he had been having chest pain all week.  His chest pain was severe last Tuesday with exertion and he described it as a squeezing tight and sharp sensation that did not radiate but did make him short of breath.  Since last Tuesday he has been having more frequent episodes of chest pain.  On Friday, he had pain throughout the day that was only relieved for a few minutes with his nitroglycerin.  He also had shortness of breath throughout the day that gradually got worse.  He has had a mild cough but it has been nonproductive.   Dr. Stanford Breed came to see the patient and recommended cardiac catheterization.  They continued his aspirin, nitroglycerin, heparin and statin at that time.  He was also on metoprolol 25 mg twice daily.  Since he was off his apixaban they initiated IV heparin.   He underwent cardiac catheterization today which showed multivessel coronary disease including 50% stenosis of the distal LMCA extending into the ostial LAD where there is a heavily calcified 90% stenosis.  The mid LAD has sequential 50 to 80% stenosis with calcification.  Dominant left circumflex has  40 to 50% proximal, mid, and distal lesions.  Also noted on cardiac cath was moderate pulmonary hypertension, moderate aortic stenosis, and ischemic cardiomyopathy.  Due to his multivessel disease we are consulted for possible surgical revascularization.  He does not currently have chest pain just back and neck pain from his arthritis. He does get around okay at home and lives with his wife. He is able to drive to the grocery store. He has limited his activity since his chest pain started and even had pain and shortness of breath without activity.    Current Activity/ Functional Status: Patient was independent with mobility/ambulation, transfers, ADL's, IADL's.   Zubrod Score: At the time of surgery this patient's most appropriate activity status/level should be described as: '[]'$     0    Normal activity, no symptoms '[x]'$     1    Restricted in physical strenuous activity but ambulatory, able to do out light work '[]'$     2    Ambulatory and capable of self care, unable to do work activities, up and about                 more than 50%  Of the time                            '[]'$     3    Only limited self care, in bed greater than  50% of waking hours '[]'$     4    Completely disabled, no self care, confined to bed or chair '[]'$     5    Moribund  Past Medical History:  Diagnosis Date   Acute diastolic CHF (congestive heart failure) ([place]) 04/12/2019   Acute on chronic combined systolic and diastolic CHF (congestive heart failure) ([place]) 04/09/2019   Arthritis    Asthma    BMI 32.0-32.9,adult 04/17/2020   Chronic anemia    CKD (chronic kidney disease), stage III (HCC)    Complete atrioventricular block ([place]) 04/01/2015   Complete heart block ([place]) 04/01/2015   Coronary artery disease    a. moderate nonobstructive CAD by cath in 2018.   Coronary atherosclerosis of native coronary artery 04/09/2019   Mid RCA lesion, 60 %stenosed. Prox LAD lesion, 40 %stenosed. Prox LAD to Mid LAD lesion, 50 %stenosed. LPDA  lesion, 60 %stenosed.   Disorder of carotid artery ([place]) 12/29/2016   Dyslipidemia 12/29/2016   Essential hypertension, benign 08/15/2017   Exertional angina ([place]) 02/08/2017   Gastritis and gastroduodenitis    GERD (gastroesophageal reflux disease)    Hypothyroidism    Iron deficiency anemia    Late effect of cerebrovascular accident (CVA) 05/25/2017   Late effects of cerebrovascular disease 05/25/2017   Malignant neoplasm of prostate ([place]) 08/17/2019   Mixed hyperlipidemia 12/14/2019   Obesity 01/25/2018   Osteoarthritis of both knees 08/17/2019   Pacemaker 08/08/2019   PAF (paroxysmal atrial fibrillation) (HCC)    Paroxysmal atrial fibrillation ([place]) 04/01/2015   Peripheral vascular disease ([place])    Polyp of cecum    Precordial pain 04/23/2015   Preinfarction syndrome ([place]) 02/08/2017   Right-sided carotid artery disease ([place]) 12/29/2016   Routine general medical examination at a health care facility 04/01/2015   Sequela of cerebrovascular accident 05/25/2017   Symptomatic anemia 04/12/2019    Past Surgical History:  Procedure Laterality Date   BIOPSY  04/11/2019   Procedure: BIOPSY;  Surgeon: Milus Banister, MD;  Location: [facility];  Service: Endoscopy;;   CATARACT EXTRACTION     COLONOSCOPY WITH PROPOFOL N/A 04/11/2019   Procedure: COLONOSCOPY WITH PROPOFOL;  Surgeon: Milus Banister, MD;  Location: [facility] ENDOSCOPY;  Service: Endoscopy;  Laterality: N/A;   ESOPHAGOGASTRODUODENOSCOPY (EGD) WITH PROPOFOL N/A 04/11/2019   Procedure: ESOPHAGOGASTRODUODENOSCOPY (EGD) WITH PROPOFOL;  Surgeon: Milus Banister, MD;  Location: [facility]COPY;  Service: Endoscopy;  Laterality: N/A;   HIP SURGERY     INSERT / REPLACE / REMOVE PACEMAKER     [employer]   JOINT REPLACEMENT Right 10/20/1994   Hip  (Left Hip in 10/96)   LEFT HEART CATH AND CORONARY ANGIOGRAPHY N/A 02/08/2017   Procedure: LEFT HEART CATH AND CORONARY ANGIOGRAPHY;  Surgeon: Martinique, Peter M, MD;  Location: [facility] LAB;  Service:  Cardiovascular;  Laterality: N/A;   ORIF PERIPROSTHETIC FRACTURE Right 08/17/2017   Procedure: OPEN REDUCTION INTERNAL FIXATION (ORIF) PERIPROSTHETIC FRACTURE RIGHT FEMUR;  Surgeon: Paralee Cancel, MD;  Location: [facility] ORS;  Service: Orthopedics;  Laterality: Right;   ORIF PERIPROSTHETIC FRACTURE Right 01/23/2018   Procedure: Redo open reduction internal fixation right periprosthetic femur fracture;  Surgeon: Paralee Cancel, MD;  Location: [facility]S;  Service: Orthopedics;  Laterality: Right;  120 mins   POLYPECTOMY  04/11/2019   Procedure: POLYPECTOMY;  Surgeon: Milus Banister, MD;  Location: [facility] ENDOSCOPY;  Service: Endoscopy;;    Social History   Tobacco Use  Smoking Status Never  Smokeless Tobacco Current   Types:  Snuff  Tobacco Comments   1 can per day    Social History   Substance and Sexual Activity  Alcohol Use Yes   Alcohol/week: 3.0 standard drinks   Types: 3 Cans of beer per week   Comment: regular use 4 times a week     No Known Allergies  Current Facility-Administered Medications  Medication Dose Route Frequency Provider Last Rate Last Admin   0.9 %  sodium chloride infusion  250 mL Intravenous PRN End, Harrell Gave, MD       acetaminophen (TYLENOL) tablet 650 mg  650 mg Oral Q4H PRN End, Christopher, MD   650 mg at 02/01/21 0547   albuterol (VENTOLIN HFA) 108 (90 Base) MCG/ACT inhaler 2 puff  2 puff Inhalation Q6H PRN End, Christopher, MD   2 puff at 02/01/21 0550   amLODipine (NORVASC) tablet 5 mg  5 mg Oral QHS End, Christopher, MD   5 mg at 02/01/21 2154   aspirin EC tablet 81 mg  81 mg Oral Daily End, Christopher, MD   81 mg at 02/02/21 0803   atorvastatin (LIPITOR) tablet 40 mg  40 mg Oral Daily End, Christopher, MD   40 mg at 02/02/21 0803   budesonide (PULMICORT) nebulizer solution 0.5 mg  0.5 mg Inhalation BID End, Christopher, MD   0.5 mg at 02/01/21 2021   ferrous sulfate tablet 325 mg  325 mg Oral Q breakfast End, Christopher, MD   325 mg at 02/02/21 [id number]    furosemide (LASIX) injection 40 mg  40 mg Intravenous Daily End, Christopher, MD       hydrALAZINE (APRESOLINE) injection 10 mg  10 mg Intravenous Q20 Min PRN End, Harrell Gave, MD       HYDROcodone-acetaminophen (NORCO/VICODIN) 5-325 MG per tablet 1 tablet  1 tablet Oral BID PRN End, Christopher, MD   1 tablet at 02/01/21 2154   labetalol (NORMODYNE) injection 10 mg  10 mg Intravenous Q10 min PRN End, Harrell Gave, MD       levothyroxine (SYNTHROID) tablet 50 mcg  50 mcg Oral Daily End, Christopher, MD   50 mcg at 02/02/21 0604   metoprolol tartrate (LOPRESSOR) tablet 25 mg  25 mg Oral BID End, Christopher, MD   25 mg at 02/02/21 0803   multivitamin with minerals tablet 1 tablet  1 tablet Oral Daily End, Christopher, MD   1 tablet at 02/02/21 0803   nitroGLYCERIN 50 mg in dextrose 5 % 250 mL (0.2 mg/mL) infusion  0-200 mcg/min Intravenous Continuous End, Christopher, MD 4.5 mL/hr at 02/02/21 0606 15 mcg/min at 02/02/21 0606   ondansetron (ZOFRAN) injection 4 mg  4 mg Intravenous Q6H PRN End, Christopher, MD   4 mg at 01/31/21 0917   pantoprazole (PROTONIX) EC tablet 40 mg  40 mg Oral Daily End, Christopher, MD   40 mg at 02/02/21 0803   ranolazine (RANEXA) 12 hr tablet 500 mg  500 mg Oral BID End, Christopher, MD   500 mg at 02/02/21 0803   sodium chloride flush (NS) 0.9 % injection 3 mL  3 mL Intravenous Q12H End, Christopher, MD   3 mL at 02/02/21 0806   sodium chloride flush (NS) 0.9 % injection 3 mL  3 mL Intravenous Q12H End, Christopher, MD       sodium chloride flush (NS) 0.9 % injection 3 mL  3 mL Intravenous PRN End, Harrell Gave, MD       vitamin B-12 (CYANOCOBALAMIN) tablet 1,000 mcg  1,000 mcg Oral Daily End, Harrell Gave, MD  1,000 mcg at 02/02/21 0809    Medications Prior to Admission  Medication Sig Dispense Refill Last Dose   albuterol (VENTOLIN HFA) 108 (90 Base) MCG/ACT inhaler INHALE 2 PUFFS BY MOUTH EVERY 6 HOURS AS NEEDED (Patient taking differently: Inhale 2 puffs into the  lungs every 6 (six) hours as needed for wheezing or shortness of breath.) 8.5 g 6 01/29/2021   amLODipine (NORVASC) 5 MG tablet TAKE 1 TABLET(5 MG) BY MOUTH AT BEDTIME (Patient taking differently: Take 5 mg by mouth at bedtime.) 90 tablet 3 01/29/2021   apixaban (ELIQUIS) 5 MG TABS tablet Take 1 tablet (5 mg total) by mouth 2 (two) times daily. 60 tablet 6 01/30/2021 at 0900   atorvastatin (LIPITOR) 40 MG tablet TAKE 1 TABLET BY MOUTH DAILY 90 tablet 2 01/30/2021   fluticasone (FLOVENT HFA) 110 MCG/ACT inhaler Inhale 2 puffs into the lungs 2 (two) times daily. 12 g 6 01/30/2021   HYDROcodone-acetaminophen (NORCO/VICODIN) 5-325 MG tablet Take 1 tablet by mouth 2 (two) times daily as needed (pain). 60 tablet 0 01/30/2021   isosorbide mononitrate (IMDUR) 60 MG 24 hr tablet TAKE 1 TABLET(60 MG) BY MOUTH DAILY (Patient taking differently: Take 60 mg by mouth daily.) 60 tablet 3 01/29/2021   levothyroxine (SYNTHROID) 50 MCG tablet TAKE 1 TABLET BY MOUTH DAILY 90 tablet 2 01/30/2021   Multiple Vitamin (MULTIVITAMIN WITH MINERALS) TABS tablet Take 1 tablet by mouth daily.   01/30/2021   nitroGLYCERIN (NITROSTAT) 0.4 MG SL tablet DISSOLVE 1 TABLET UNDER THE TONGUE EVERY 5 MINUTES AS NEEDED FOR CHEST PAIN. MAX OF 3 DOSES IN 15 MINUTES 25 tablet 3 PRN   omeprazole (PRILOSEC) 20 MG capsule Take 20 mg by mouth daily.   01/30/2021   ranolazine (RANEXA) 500 MG 12 hr tablet TAKE 1 TABLET(500 MG) BY MOUTH TWICE DAILY (Patient taking differently: Take 500 mg by mouth 2 (two) times daily. TAKE 1 TABLET(500 MG) BY MOUTH TWICE DAILY) 180 tablet 2 01/30/2021   vitamin B-12 (CYANOCOBALAMIN) 1000 MCG tablet Take 1,000 mcg by mouth daily.    01/30/2021   FEROSUL 325 (65 Fe) MG tablet TAKE 1 TABLET(325 MG) BY MOUTH TWICE DAILY (Patient not taking: No sig reported) 100 tablet 2 Not Taking    Family History  Problem Relation Age of Onset   Heart attack Mother      Review of Systems:   Review of Systems  Respiratory:  Positive for  shortness of breath.   Cardiovascular:  Positive for chest pain. Negative for leg swelling.  Gastrointestinal:  Positive for heartburn. Negative for nausea and vomiting.  Musculoskeletal:  Positive for back pain, joint pain and neck pain.  Pertinent items are noted in HPI.      Physical Exam: BP (!) 143/69 (BP Location: Left Arm)   Pulse 61   Temp 98.6 F (37 C) (Oral)   Resp 17   Ht '5\' 2"'$  (1.575 m)   Wt 83.1 kg   SpO2 98%   BMI 33.51 kg/m    General appearance: alert, cooperative, and no distress Resp: clear to auscultation bilaterally Cardio: NSR, 3/6 systolic murmur LSB GI: soft, non-tender; bowel sounds normal; no masses,  no organomegaly Extremities: extremities normal, atraumatic, no cyanosis or edema Neurologic: Grossly normal  Diagnostic Studies & Laboratory data:     Recent Radiology Findings:   CARDIAC CATHETERIZATION  Result Date: 02/02/2021 Conclusions: Multivessel coronary artery disease including 50% distal LMCA extending into the ostial LAD, where there is a heavy calcified 90% stenosis.  Mid LAD has sequential 50-80% stenoses with calcification.  Dominant LCx has sequential 40-50% proximal, mid, and distal lesions. Moderately to severely elevated left heart and right heart filling pressures. Moderate pulmonary hypertension. Moderate aortic stenosis. Normal Fick cardiac output/index. Recommendations: Cardiac surgery consultation for CABG +/- AVR. Gentle diuresis and escalation of GDMT for acute on chronic HFrEF due to ischemic cardiomyopathy. Aggressive secondary prevention. Nelva Bush, MD [facility]  DG Chest [place] 1V same Day  Result Date: 02/01/2021 CLINICAL DATA:  Shortness of breath EXAM: PORTABLE CHEST 1 VIEW COMPARISON:  January 30, 2021 FINDINGS: Stable cardiomegaly and pacemaker. No pneumothorax. Mild patchy opacity in the right perihilar region. No overt edema. No other acute abnormalities. IMPRESSION: New mild patchy opacity in the right lung is  concerning for developing infiltrate. Recommend clinical correlation and attention on follow-up. No other interval changes. Electronically Signed   By: Dorise Bullion III M.D.   On: 02/01/2021 09:11   ECHOCARDIOGRAM LIMITED  Result Date: 01/31/2021    ECHOCARDIOGRAM LIMITED REPORT   Patient Name:   [name] Date of Exam: 01/31/2021 Medical Rec #:  [id number]        Height:       62.0 in Accession #:    [id number]       Weight:       188.9 lb Date of Birth:  1935/02/17        BSA:          1.866 m Patient Age:    64 years         BP:           124/67 mmHg Patient Gender: M                HR:           68 bpm. Exam Location:  Inpatient Procedure: Limited Echo, Color Doppler and Cardiac Doppler Indications:    acute myocardial infarction  History:        Patient has prior history of Echocardiogram examinations, most                 recent 08/27/2020. Chronic kidney disease, Arrythmias:complete                 heart block; Risk Factors:Hypertension and Dyslipidemia.  Sonographer:    Johny Chess RDCS Referring Phys: [id number] Letitia Libra A WILMOT IMPRESSIONS  1. Left ventricular ejection fraction, by estimation, is 35 to 40%. The left ventricle has moderately decreased function. The left ventricle demonstrates regional wall motion abnormalities (see scoring diagram/findings for description). Left ventricular  diastolic parameters are consistent with Grade III diastolic dysfunction (restrictive).  2. Right ventricular systolic function is normal. The right ventricular size is normal. There is moderately elevated pulmonary artery systolic pressure. The estimated right ventricular systolic pressure is 123XX123 mmHg.  3. A small pericardial effusion is present. The pericardial effusion is posterior and lateral to the left ventricle. There is no evidence of cardiac tamponade.  4. The mitral valve is degenerative. Mild to moderate mitral valve regurgitation. No evidence of mitral stenosis.  5. Tricuspid valve regurgitation  is moderate to severe.  6. The aortic valve is tricuspid with moderate calcifications. V max 2.1 m/s, MG 10 mmHG, EOA 1.24 cm2, DI 0.39. SV noted to be low in setting of reduced EF. Suspect this is moderate aortic stenosis is present similar to prior echo. The aortic valve is tricuspid. There is moderate calcification of the aortic valve. There is moderate thickening of  the aortic valve. Aortic valve regurgitation is mild. Moderate aortic valve stenosis.  7. The inferior vena cava is normal in size with greater than 50% respiratory variability, suggesting right atrial pressure of 3 mmHg. Comparison(s): Changes from prior study are noted. EF is now 35-40%. WMA concerning for LAD infarction. FINDINGS  Left Ventricle: Left ventricular ejection fraction, by estimation, is 35 to 40%. The left ventricle has moderately decreased function. The left ventricle demonstrates regional wall motion abnormalities. The left ventricular internal cavity size was normal in size. There is no left ventricular hypertrophy. Left ventricular diastolic parameters are consistent with Grade III diastolic dysfunction (restrictive).  LV Wall Scoring: The apical septal segment, apical inferior segment, and apex are akinetic. Right Ventricle: The right ventricular size is normal. No increase in right ventricular wall thickness. Right ventricular systolic function is normal. There is moderately elevated pulmonary artery systolic pressure. The tricuspid regurgitant velocity is 3.54 m/s, and with an assumed right atrial pressure of 3 mmHg, the estimated right ventricular systolic pressure is 123XX123 mmHg. Pericardium: A small pericardial effusion is present. The pericardial effusion is posterior and lateral to the left ventricle. There is no evidence of cardiac tamponade. Presence of pericardial fat pad. Mitral Valve: The mitral valve is degenerative in appearance. Mild to moderate mitral annular calcification. Mild to moderate mitral valve  regurgitation. No evidence of mitral valve stenosis. Tricuspid Valve: Tricuspid valve regurgitation is moderate to severe. Aortic Valve: The aortic valve is tricuspid with moderate calcifications. V max 2.1 m/s, MG 10 mmHG, EOA 1.24 cm2, DI 0.39. SV noted to be low in setting of reduced EF. Suspect this is moderate aortic stenosis is present similar to prior echo. The aortic  valve is tricuspid. There is moderate calcification of the aortic valve. There is moderate thickening of the aortic valve. Aortic valve regurgitation is mild. Aortic regurgitation PHT measures 342 msec. Moderate aortic stenosis is present. Aortic valve mean gradient measures 10.0 mmHg. Aortic valve peak gradient measures 18.7 mmHg. Aortic valve area, by VTI measures 1.24 cm. Aorta: The aortic root and ascending aorta are structurally normal, with no evidence of dilitation. Venous: The inferior vena cava is normal in size with greater than 50% respiratory variability, suggesting right atrial pressure of 3 mmHg. Additional Comments: A device lead is visualized in the right atrium and right ventricle. LEFT VENTRICLE PLAX 2D LVIDd:         5.10 cm     Diastology LVIDs:         3.90 cm     LV e' medial:    4.79 cm/s LV PW:         1.10 cm     LV E/e' medial:  26.5 LV IVS:        1.00 cm     LV e' lateral:   5.00 cm/s LVOT diam:     2.00 cm     LV E/e' lateral: 25.4 LV SV:         55 LV SV Index:   29 LVOT Area:     3.14 cm  LV Volumes (MOD) LV vol d, MOD A2C: 79.8 ml LV vol d, MOD A4C: 82.4 ml LV vol s, MOD A2C: 46.1 ml LV vol s, MOD A4C: 52.2 ml LV SV MOD A2C:     33.7 ml LV SV MOD A4C:     82.4 ml LV SV MOD BP:      32.5 ml IVC IVC diam: 2.00 cm LEFT ATRIUM  Index LA diam:    4.50 cm 2.41 cm/m  AORTIC VALVE AV Area (Vmax):    1.10 cm AV Area (Vmean):   1.07 cm AV Area (VTI):     1.24 cm AV Vmax:           216.00 cm/s AV Vmean:          147.000 cm/s AV VTI:            0.445 m AV Peak Grad:      18.7 mmHg AV Mean Grad:      10.0 mmHg  LVOT Vmax:         75.80 cm/s LVOT Vmean:        50.100 cm/s LVOT VTI:          0.175 m LVOT/AV VTI ratio: 0.39 AI PHT:            342 msec  AORTA Ao Asc diam: 2.90 cm MITRAL VALVE                TRICUSPID VALVE MV Area (PHT): 3.51 cm     TR Peak grad:   50.1 mmHg MV Decel Time: 216 msec     TR Vmax:        354.00 cm/s MV E velocity: 127.00 cm/s MV A velocity: 49.40 cm/s   SHUNTS MV E/A ratio:  2.57         Systemic VTI:  0.18 m                             Systemic Diam: 2.00 cm Eleonore Chiquito MD Electronically signed by Eleonore Chiquito MD Signature Date/Time: 01/31/2021/5:11:08 PM    Final      I have independently reviewed the above radiologic studies and discussed with the patient   Recent Lab Findings: Lab Results  Component Value Date   WBC 6.9 02/02/2021   HGB 10.5 (L) 02/02/2021   HCT 31.5 (L) 02/02/2021   PLT 208 02/02/2021   GLUCOSE 99 02/02/2021   CHOL 164 01/30/2021   TRIG 83 01/30/2021   HDL 55 01/30/2021   LDLCALC 92 01/30/2021   ALT 17 01/30/2021   AST 30 01/30/2021   NA 132 (L) 02/02/2021   K 4.0 02/02/2021   CL 96 (L) 02/02/2021   CREATININE 1.43 (H) 02/02/2021   BUN 19 02/02/2021   CO2 28 02/02/2021   TSH 3.000 08/15/2020   INR 1.41 08/15/2017   HGBA1C  07/07/2008    5.8 (NOTE)   The ADA recommends the following therapeutic goal for glycemic   control related to Hgb A1C measurement:   Goal of Therapy:   < 7.0% Hgb A1C   Reference: American Diabetes Association: Clinical Practice   Recommendations 2008, Diabetes Care,  2008, 31:(Suppl 1).      Assessment / Plan:      CAD-cardiac cath listed above. Continue medical management with IV heparin and IV nitro. Biggest complaint for me was neck and back pain. Continue asa/statin/BB. Holding Eliquis Aortic Stenosis-moderate on Echo. Loud murmur on exam. May need AVR in addition to CABG chronic diastolic congestive heart failure-Not fluid over loaded on exam. EF 35% chronic stage IIIa kidney disease-recent creatinine  1.43 paroxysmal atrial fibrillation-holding Eliquis for now, on heparin gtt Heart block s/p PPM Asthma-controlled on home inhalers Obesity Hyperlipidemia-continue statin therapy Hypertension-well controlled Hypothyroidism-on Synthroid Arthritis-on chronic pain medication for his back and neck which are his chief complaint during my interview  Plan: Discussed coronary artery bypass grafting and AVR with patient and wife at the bedside. Explained that he would likely be high risk due to age and comorbidities. Dr. Kipp Brood to further assess and review all imaging. All questions answered to the patient and wife's satisfaction. They would like to speak to the surgeon before making a final decision about surgery. I did discuss that if he did go to the OR we would need to wait at least 5 days for Eliquis washout. Due to scheduling probably would not be until next week.     I  spent 40 minutes counseling the patient face to face.   Nicholes Rough, PA-C 02/02/2021 11:07 AM

## 2021-02-02 NOTE — H&P (View-Only) (Signed)
Progress Note  Patient Name: [name] Date of Encounter: 02/02/2021  [facility] Cardiologist: Jenne Campus, MD   Subjective   Denies CP or dyspnea  Inpatient Medications    Scheduled Meds:  amLODipine  5 mg Oral QHS   aspirin EC  81 mg Oral Daily   atorvastatin  40 mg Oral Daily   budesonide  0.5 mg Inhalation BID   ferrous sulfate  325 mg Oral Q breakfast   levothyroxine  50 mcg Oral Daily   metoprolol tartrate  25 mg Oral BID   multivitamin with minerals  1 tablet Oral Daily   pantoprazole  40 mg Oral Daily   ranolazine  500 mg Oral BID   sodium chloride flush  3 mL Intravenous Q12H   vitamin B-12  1,000 mcg Oral Daily   Continuous Infusions:  sodium chloride     sodium chloride 10 mL/hr at 02/02/21 0606   heparin 1,000 Units/hr (02/02/21 0606)   nitroGLYCERIN 15 mcg/min (02/02/21 0606)   PRN Meds: sodium chloride, acetaminophen, albuterol, HYDROcodone-acetaminophen, ondansetron (ZOFRAN) IV, sodium chloride flush   Vital Signs    Vitals:   02/01/21 1945 02/01/21 2021 02/02/21 0415 02/02/21 0748  BP: (!) 141/61  (!) 145/66 137/64  Pulse: 65  64 80  Resp: '18  18 20  '$ Temp: 98.6 F (37 C)  98.6 F (37 C) 98.6 F (37 C)  TempSrc: Oral  Oral Oral  SpO2: 99% 99% 97% 100%  Weight:   83.1 kg   Height:        Intake/Output Summary (Last 24 hours) at 02/02/2021 0755 Last data filed at 02/02/2021 0606 Gross per 24 hour  Intake 684.3 ml  Output 1925 ml  Net -1240.7 ml    Last 3 Weights 02/02/2021 02/01/2021 01/30/2021  Weight (lbs) 183 lb 3.2 oz 186 lb 11.2 oz 188 lb 15 oz  Weight (kg) 83.099 kg 84.687 kg 85.7 kg     Telemetry-ventricular paced; personally reviewed.  Physical Exam   GEN: NAD Neck: supple, no adenopathy Cardiac: RRR, 2/6 systolic murmur LSB; no DM, no gallop Respiratory: Diminished breath sounds left lower lobe; no wheeze GI: Soft, NT/ND, no masses MS: No edema Neuro:  No focal findings Psych: Normal affect   Labs     High Sensitivity Troponin:   Recent Labs  Lab 01/30/21 2042 01/30/21 2315  TROPONINIHS 1,689* 1,730*       Chemistry Recent Labs  Lab 01/30/21 2042 02/01/21 0222 02/02/21 0200  NA 128* 133* 132*  K 3.6 4.0 4.0  CL 99 97* 96*  CO2 18* 24 28  GLUCOSE 115* 107* 99  BUN '13 15 19  '$ CREATININE 1.30* 1.31* 1.43*  CALCIUM 7.9* 8.8* 8.8*  PROT 6.2*  --   --   ALBUMIN 3.2*  --   --   AST 30  --   --   ALT 17  --   --   ALKPHOS 62  --   --   BILITOT 0.8  --   --   GFRNONAA 55* 54* 49*  ANIONGAP '11 12 8      '$ Hematology Recent Labs  Lab 01/31/21 0758 02/01/21 0222 02/02/21 0200  WBC 8.2 10.6* 6.9  RBC 3.71* 3.72* 3.38*  HGB 11.6* 11.5* 10.5*  HCT 34.4* 34.8* 31.5*  MCV 92.7 93.5 93.2  MCH 31.3 30.9 31.1  MCHC 33.7 33.0 33.3  RDW 13.0 12.9 12.6  PLT 203 215 208     BNP Recent Labs  Lab 01/30/21 2042  BNP 735.1*      Radiology    DG Chest Port 1V same Day  Result Date: 02/01/2021 CLINICAL DATA:  Shortness of breath EXAM: PORTABLE CHEST 1 VIEW COMPARISON:  January 30, 2021 FINDINGS: Stable cardiomegaly and pacemaker. No pneumothorax. Mild patchy opacity in the right perihilar region. No overt edema. No other acute abnormalities. IMPRESSION: New mild patchy opacity in the right lung is concerning for developing infiltrate. Recommend clinical correlation and attention on follow-up. No other interval changes. Electronically Signed   By: Dorise Bullion III M.D.   On: 02/01/2021 09:11   ECHOCARDIOGRAM LIMITED  Result Date: 01/31/2021    ECHOCARDIOGRAM LIMITED REPORT   Patient Name:   [name] Date of Exam: 01/31/2021 Medical Rec #:  [id number]        Height:       62.0 in Accession #:    [id number]       Weight:       188.9 lb Date of Birth:  19-Aug-1934        BSA:          1.866 m Patient Age:    86 years         BP:           124/67 mmHg Patient Gender: M                HR:           68 bpm. Exam Location:  Inpatient Procedure: Limited Echo, Color Doppler and  Cardiac Doppler Indications:    acute myocardial infarction  History:        Patient has prior history of Echocardiogram examinations, most                 recent 08/27/2020. Chronic kidney disease, Arrythmias:complete                 heart block; Risk Factors:Hypertension and Dyslipidemia.  Sonographer:    Johny Chess RDCS Referring Phys: [id number] Letitia Libra A WILMOT IMPRESSIONS  1. Left ventricular ejection fraction, by estimation, is 35 to 40%. The left ventricle has moderately decreased function. The left ventricle demonstrates regional wall motion abnormalities (see scoring diagram/findings for description). Left ventricular  diastolic parameters are consistent with Grade III diastolic dysfunction (restrictive).  2. Right ventricular systolic function is normal. The right ventricular size is normal. There is moderately elevated pulmonary artery systolic pressure. The estimated right ventricular systolic pressure is 123XX123 mmHg.  3. A small pericardial effusion is present. The pericardial effusion is posterior and lateral to the left ventricle. There is no evidence of cardiac tamponade.  4. The mitral valve is degenerative. Mild to moderate mitral valve regurgitation. No evidence of mitral stenosis.  5. Tricuspid valve regurgitation is moderate to severe.  6. The aortic valve is tricuspid with moderate calcifications. V max 2.1 m/s, MG 10 mmHG, EOA 1.24 cm2, DI 0.39. SV noted to be low in setting of reduced EF. Suspect this is moderate aortic stenosis is present similar to prior echo. The aortic valve is tricuspid. There is moderate calcification of the aortic valve. There is moderate thickening of the aortic valve. Aortic valve regurgitation is mild. Moderate aortic valve stenosis.  7. The inferior vena cava is normal in size with greater than 50% respiratory variability, suggesting right atrial pressure of 3 mmHg. Comparison(s): Changes from prior study are noted. EF is now 35-40%. WMA concerning for LAD  infarction. FINDINGS  Left Ventricle: Left ventricular  ejection fraction, by estimation, is 35 to 40%. The left ventricle has moderately decreased function. The left ventricle demonstrates regional wall motion abnormalities. The left ventricular internal cavity size was normal in size. There is no left ventricular hypertrophy. Left ventricular diastolic parameters are consistent with Grade III diastolic dysfunction (restrictive).  LV Wall Scoring: The apical septal segment, apical inferior segment, and apex are akinetic. Right Ventricle: The right ventricular size is normal. No increase in right ventricular wall thickness. Right ventricular systolic function is normal. There is moderately elevated pulmonary artery systolic pressure. The tricuspid regurgitant velocity is 3.54 m/s, and with an assumed right atrial pressure of 3 mmHg, the estimated right ventricular systolic pressure is 123XX123 mmHg. Pericardium: A small pericardial effusion is present. The pericardial effusion is posterior and lateral to the left ventricle. There is no evidence of cardiac tamponade. Presence of pericardial fat pad. Mitral Valve: The mitral valve is degenerative in appearance. Mild to moderate mitral annular calcification. Mild to moderate mitral valve regurgitation. No evidence of mitral valve stenosis. Tricuspid Valve: Tricuspid valve regurgitation is moderate to severe. Aortic Valve: The aortic valve is tricuspid with moderate calcifications. V max 2.1 m/s, MG 10 mmHG, EOA 1.24 cm2, DI 0.39. SV noted to be low in setting of reduced EF. Suspect this is moderate aortic stenosis is present similar to prior echo. The aortic  valve is tricuspid. There is moderate calcification of the aortic valve. There is moderate thickening of the aortic valve. Aortic valve regurgitation is mild. Aortic regurgitation PHT measures 342 msec. Moderate aortic stenosis is present. Aortic valve mean gradient measures 10.0 mmHg. Aortic valve peak gradient  measures 18.7 mmHg. Aortic valve area, by VTI measures 1.24 cm. Aorta: The aortic root and ascending aorta are structurally normal, with no evidence of dilitation. Venous: The inferior vena cava is normal in size with greater than 50% respiratory variability, suggesting right atrial pressure of 3 mmHg. Additional Comments: A device lead is visualized in the right atrium and right ventricle. LEFT VENTRICLE PLAX 2D LVIDd:         5.10 cm     Diastology LVIDs:         3.90 cm     LV e' medial:    4.79 cm/s LV PW:         1.10 cm     LV E/e' medial:  26.5 LV IVS:        1.00 cm     LV e' lateral:   5.00 cm/s LVOT diam:     2.00 cm     LV E/e' lateral: 25.4 LV SV:         55 LV SV Index:   29 LVOT Area:     3.14 cm  LV Volumes (MOD) LV vol d, MOD A2C: 79.8 ml LV vol d, MOD A4C: 82.4 ml LV vol s, MOD A2C: 46.1 ml LV vol s, MOD A4C: 52.2 ml LV SV MOD A2C:     33.7 ml LV SV MOD A4C:     82.4 ml LV SV MOD BP:      32.5 ml IVC IVC diam: 2.00 cm LEFT ATRIUM         Index LA diam:    4.50 cm 2.41 cm/m  AORTIC VALVE AV Area (Vmax):    1.10 cm AV Area (Vmean):   1.07 cm AV Area (VTI):     1.24 cm AV Vmax:           216.00 cm/s AV  Vmean:          147.000 cm/s AV VTI:            0.445 m AV Peak Grad:      18.7 mmHg AV Mean Grad:      10.0 mmHg LVOT Vmax:         75.80 cm/s LVOT Vmean:        50.100 cm/s LVOT VTI:          0.175 m LVOT/AV VTI ratio: 0.39 AI PHT:            342 msec  AORTA Ao Asc diam: 2.90 cm MITRAL VALVE                TRICUSPID VALVE MV Area (PHT): 3.51 cm     TR Peak grad:   50.1 mmHg MV Decel Time: 216 msec     TR Vmax:        354.00 cm/s MV E velocity: 127.00 cm/s MV A velocity: 49.40 cm/s   SHUNTS MV E/A ratio:  2.57         Systemic VTI:  0.18 m                             Systemic Diam: 2.00 cm Eleonore Chiquito MD Electronically signed by Eleonore Chiquito MD Signature Date/Time: 01/31/2021/5:11:08 PM    Final      Patient Profile     86 y.o. male with past medical history of paroxysmal atrial  fibrillation, chronic diastolic congestive heart failure, chronic stage IIIa kidney disease, nonobstructive coronary disease admitted with unstable angina.  Cardiac catheterization 2018 showed 60% right, 50% LAD and 60% PDA.  Medical therapy recommended.  Last echocardiogram March 2022 showed normal LV function, moderate pulmonary hypertension, moderate left atrial enlargement, mild to moderate mitral regurgitation, mild to moderate tricuspid regurgitation, mild to moderate aortic insufficiency, moderate aortic stenosis with mean gradient 21 mmHg.  Assessment & Plan    1 chest pain-patient denies recurrent chest pain.  Previous symptoms were somewhat atypical and no clear trend.  However LV function is reduced compared to previous.  Plan is for right and left cardiac catheterization today.  The risks and benefits including myocardial infarction, CVA and death previously discussed and he agrees to proceed.  I have not hydrated prior to procedure given symptoms of CHF.  We will hold on further diuresis.  Continue aspirin, nitroglycerin, heparin and statin.  Continue beta-blocker.    2 aortic stenosis-moderate on follow-up echocardiogram.  Will need follow-up as an outpatient.  Plan right heart catheterization as outlined above.  3 chronic diastolic congestive heart failure-patient appears to be euvolemic today.  I would not diurese further.  4 chronic stage IIIa kidney disease-hold further diuretics prior to catheterization.  Follow renal function after procedure.  5 paroxysmal atrial fibrillation-Continue beta-blocker.  Resume apixaban once all procedures complete.  Continue heparin for now.  6 hypertension-metoprolol recently added.  Follow blood pressure and adjust medications as needed.  7 prior pacemaker  8 low-grade fever-resolved.  Urinalysis unremarkable.  Chest x-ray with question right hilar infiltrate.  We will plan repeat PA and lateral chest x-ray once catheterization complete.  He does  not have symptoms of pneumonia including no productive cough.    9 Reduced LV function-will DC amlodipine and treat with entresto and jardiance following cath once renal function stable; transition to toprol prior to DC.  For questions or updates, please contact  [facility] Please consult [email or web address] for contact info under        Signed, Kirk Ruths, MD  02/02/2021, 7:55 AM

## 2021-02-03 DIAGNOSIS — I5023 Acute on chronic systolic (congestive) heart failure: Secondary | ICD-10-CM

## 2021-02-03 DIAGNOSIS — I214 Non-ST elevation (NSTEMI) myocardial infarction: Secondary | ICD-10-CM | POA: Diagnosis not present

## 2021-02-03 LAB — CBC
HCT: 34.1 % — ABNORMAL LOW (ref 39.0–52.0)
Hemoglobin: 11.5 g/dL — ABNORMAL LOW (ref 13.0–17.0)
MCH: 31 pg (ref 26.0–34.0)
MCHC: 33.7 g/dL (ref 30.0–36.0)
MCV: 91.9 fL (ref 80.0–100.0)
Platelets: 226 10*3/uL (ref 150–400)
RBC: 3.71 MIL/uL — ABNORMAL LOW (ref 4.22–5.81)
RDW: 12.4 % (ref 11.5–15.5)
WBC: 7.9 10*3/uL (ref 4.0–10.5)
nRBC: 0 % (ref 0.0–0.2)

## 2021-02-03 LAB — BASIC METABOLIC PANEL
Anion gap: 11 (ref 5–15)
BUN: 19 mg/dL (ref 8–23)
CO2: 29 mmol/L (ref 22–32)
Calcium: 9.2 mg/dL (ref 8.9–10.3)
Chloride: 95 mmol/L — ABNORMAL LOW (ref 98–111)
Creatinine, Ser: 1.57 mg/dL — ABNORMAL HIGH (ref 0.61–1.24)
GFR, Estimated: 43 mL/min — ABNORMAL LOW (ref >60)
Glucose, Bld: 99 mg/dL (ref 70–99)
Potassium: 3.7 mmol/L (ref 3.5–5.1)
Sodium: 135 mmol/L (ref 135–145)

## 2021-02-03 LAB — APTT
aPTT: 48 seconds — ABNORMAL HIGH (ref 24–36)
aPTT: 56 seconds — ABNORMAL HIGH (ref 24–36)

## 2021-02-03 MED ORDER — CLOPIDOGREL BISULFATE 75 MG PO TABS
300.0000 mg | ORAL_TABLET | Freq: Once | ORAL | Status: AC
  Administered 2021-02-03: 300 mg via ORAL
  Filled 2021-02-03: qty 4

## 2021-02-03 MED ORDER — ATORVASTATIN CALCIUM 80 MG PO TABS
80.0000 mg | ORAL_TABLET | Freq: Every day | ORAL | Status: DC
  Administered 2021-02-03 – 2021-02-05 (×3): 80 mg via ORAL
  Filled 2021-02-03 (×3): qty 1

## 2021-02-03 MED ORDER — CLOPIDOGREL BISULFATE 75 MG PO TABS
75.0000 mg | ORAL_TABLET | Freq: Every day | ORAL | Status: DC
  Administered 2021-02-04 – 2021-02-05 (×2): 75 mg via ORAL
  Filled 2021-02-03 (×2): qty 1

## 2021-02-03 MED FILL — Heparin Sod (Porcine)-NaCl IV Soln 1000 Unit/500ML-0.9%: INTRAVENOUS | Qty: 500 | Status: AC

## 2021-02-03 NOTE — Progress Notes (Signed)
[place] for Heparin (Apixaban on hold) Indication: chest pain/ACS and atrial fibrillation  No Known Allergies  Patient Measurements: Height: '5\' 2"'$  (157.5 cm) Weight: 81 kg (178 lb 9.2 oz) IBW/kg (Calculated) : 54.6   Vital Signs: Temp: 98.3 F (36.8 C) (08/16 0309) Temp Source: Oral (08/16 0309) BP: 125/55 (08/16 0309) Pulse Rate: 74 (08/16 0309)  Labs: Recent Labs    01/31/21 0758 01/31/21 1656 02/01/21 0222 02/02/21 0200 02/02/21 0937 02/02/21 0938 02/03/21 0217  HGB 11.6*  --  11.5* 10.5* 10.9* 10.2* 11.5*  HCT 34.4*  --  34.8* 31.5* 32.0* 30.0* 34.1*  PLT 203  --  215 208  --   --  226  APTT 75*   < > 66* 80*  --   --  48*  HEPARINUNFRC >1.10*  --  >1.10* >1.10*  --   --   --   CREATININE  --   --  1.31* 1.43*  --   --  1.57*   < > = values in this interval not displayed.     Estimated Creatinine Clearance: 32.9 mL/min (A) (by C-G formula based on SCr of 1.57 mg/dL (H)).   Medical History: Past Medical History:  Diagnosis Date   Acute diastolic CHF (congestive heart failure) ([place]) 04/12/2019   Acute on chronic combined systolic and diastolic CHF (congestive heart failure) ([place]) 04/09/2019   Arthritis    Asthma    BMI 32.0-32.9,adult 04/17/2020   Chronic anemia    CKD (chronic kidney disease), stage III (HCC)    Complete atrioventricular block ([place]) 04/01/2015   Complete heart block ([place]) 04/01/2015   Coronary artery disease    a. moderate nonobstructive CAD by cath in 2018.   Coronary atherosclerosis of native coronary artery 04/09/2019   Mid RCA lesion, 60 %stenosed. Prox LAD lesion, 40 %stenosed. Prox LAD to Mid LAD lesion, 50 %stenosed. LPDA lesion, 60 %stenosed.   Disorder of carotid artery ([place]) 12/29/2016   Dyslipidemia 12/29/2016   Essential hypertension, benign 08/15/2017   Exertional angina ([place]) 02/08/2017   Gastritis and gastroduodenitis    GERD (gastroesophageal reflux disease)    Hypothyroidism     Iron deficiency anemia    Late effect of cerebrovascular accident (CVA) 05/25/2017   Late effects of cerebrovascular disease 05/25/2017   Malignant neoplasm of prostate ([place]) 08/17/2019   Mixed hyperlipidemia 12/14/2019   Obesity 01/25/2018   Osteoarthritis of both knees 08/17/2019   Pacemaker 08/08/2019   PAF (paroxysmal atrial fibrillation) (HCC)    Paroxysmal atrial fibrillation ([place]) 04/01/2015   Peripheral vascular disease ([place])    Polyp of cecum    Precordial pain 04/23/2015   Preinfarction syndrome ([place]) 02/08/2017   Right-sided carotid artery disease ([place]) 12/29/2016   Routine general medical examination at a health care facility 04/01/2015   Sequela of cerebrovascular accident 05/25/2017   Symptomatic anemia 04/12/2019   Assessment: 86 y/o M who presents to the ED with several days of chest pain, troponin is elevated. On apixaban PTA for afib, last dose >12 hours prior to arrival (01/30/21 '@0900'$ ). He is now s/p cath with multivessel CAD and moderate aortic stenosis. Plans for possible CABG/AVR -aPTT= 48 and below goal  Goal of Therapy:  Heparin level 0.3-0.7 units/ml aPTT 66-102 secs Monitor platelets by anticoagulation protocol: Yes   Plan:  -Increase heparin to 1100 uni.hr -aPTT in 8 hours  Hildred Laser, PharmD Clinical Pharmacist **Pharmacist phone directory can now be found on [email or web address] ([email or web address]).  Listed under  [facility].

## 2021-02-03 NOTE — Telephone Encounter (Signed)
Rodent is calling back agaging requesting a call for the health of his father at the hospital

## 2021-02-03 NOTE — Progress Notes (Signed)
[place] for Heparin (Apixaban on hold) Indication: chest pain/ACS and atrial fibrillation  No Known Allergies  Patient Measurements: Height: '5\' 2"'$  (157.5 cm) Weight: 81 kg (178 lb 9.2 oz) IBW/kg (Calculated) : 54.6   Vital Signs: Temp: 100.5 F (38.1 C) (08/16 1419) Temp Source: Axillary (08/16 1419) BP: 142/59 (08/16 1419) Pulse Rate: 77 (08/16 1419)  Labs: Recent Labs    02/01/21 0222 02/02/21 0200 02/02/21 0937 02/02/21 0938 02/03/21 0217 02/03/21 1621  HGB 11.5* 10.5* 10.9* 10.2* 11.5*  --   HCT 34.8* 31.5* 32.0* 30.0* 34.1*  --   PLT 215 208  --   --  226  --   APTT 66* 80*  --   --  48* 56*  HEPARINUNFRC >1.10* >1.10*  --   --   --   --   CREATININE 1.31* 1.43*  --   --  1.57*  --      Estimated Creatinine Clearance: 32.9 mL/min (A) (by C-G formula based on SCr of 1.57 mg/dL (H)).   Medical History: Past Medical History:  Diagnosis Date   Acute diastolic CHF (congestive heart failure) ([place]) 04/12/2019   Acute on chronic combined systolic and diastolic CHF (congestive heart failure) ([place]) 04/09/2019   Arthritis    Asthma    BMI 32.0-32.9,adult 04/17/2020   Chronic anemia    CKD (chronic kidney disease), stage III (HCC)    Complete atrioventricular block ([place]) 04/01/2015   Complete heart block ([place]) 04/01/2015   Coronary artery disease    a. moderate nonobstructive CAD by cath in 2018.   Coronary atherosclerosis of native coronary artery 04/09/2019   Mid RCA lesion, 60 %stenosed. Prox LAD lesion, 40 %stenosed. Prox LAD to Mid LAD lesion, 50 %stenosed. LPDA lesion, 60 %stenosed.   Disorder of carotid artery ([place]) 12/29/2016   Dyslipidemia 12/29/2016   Essential hypertension, benign 08/15/2017   Exertional angina ([place]) 02/08/2017   Gastritis and gastroduodenitis    GERD (gastroesophageal reflux disease)    Hypothyroidism    Iron deficiency anemia    Late effect of cerebrovascular accident (CVA) 05/25/2017   Late  effects of cerebrovascular disease 05/25/2017   Malignant neoplasm of prostate ([place]) 08/17/2019   Mixed hyperlipidemia 12/14/2019   Obesity 01/25/2018   Osteoarthritis of both knees 08/17/2019   Pacemaker 08/08/2019   PAF (paroxysmal atrial fibrillation) (HCC)    Paroxysmal atrial fibrillation ([place]) 04/01/2015   Peripheral vascular disease ([place])    Polyp of cecum    Precordial pain 04/23/2015   Preinfarction syndrome ([place]) 02/08/2017   Right-sided carotid artery disease ([place]) 12/29/2016   Routine general medical examination at a health care facility 04/01/2015   Sequela of cerebrovascular accident 05/25/2017   Symptomatic anemia 04/12/2019   Assessment: 86 y/o M who presents to the ED with several days of chest pain, troponin is elevated. On apixaban PTA for afib, last dose >12 hours prior to arrival (01/30/21 '@0900'$ ).  He is now s/p cath with multivessel CAD and moderate aortic stenosis.  Plan for PCI this week loading with clopidogrel Heparin drip 1100 uts/hr -aPTT=  56 seconds, below goal   Goal of Therapy:  Heparin level 0.3-0.7 units/ml aPTT 66-102 secs Monitor platelets by anticoagulation protocol: Yes   Plan:  -Increase heparin to 1200 units/hr Daily aptt and CBC   Bonnita Nasuti Pharm.D. CPP, BCPS Clinical Pharmacist [phone] 02/03/2021 5:41 PM

## 2021-02-03 NOTE — Progress Notes (Signed)
Interventional cardiology note: Asked to review cardiac catheterization films by Dr. Stanford Breed.  Reviewed patient's chart as well as his cath films.  The patient has been turned down for bypass surgery because of his age and comorbid medical conditions.  He presents with congestive heart failure and non-STEMI.  He has left dominant coronaries with severe calcific ostial LAD stenosis and severe mid LAD stenosis also with diffuse calcification.  The left circumflex has mild to moderate diffuse nonobstructive plaquing.  The nondominant RCA is patent.  The patient's LVEF is 35 to 40%.  Treatment options include palliative medical therapy versus high risk PCI.  Considerations for PCI include supported PCI with Impella, intra-aortic balloon pump, or proceeding without support.  I reviewed his CT angiogram from 2020 of the abdomen and pelvis.  The patient has diffuse calcific disease of both iliac arteries with areas of significant stenosis.  I do not think his iliac arteries will allow for passage of an Impella device.  I would favor treating him without support.  I reviewed the fact that we would need proceed with orbital atherectomy and stenting.  I suspect we would have to stent across the distal left main and circumflex into the proximal LAD.  A second stent will be required in the mid LAD.  While the procedural risk would be increased because of ostial LAD location and heavy calcification, as well as the need to stent across the circumflex, the circumflex itself is not diseased and I think there is a high likelihood it would remain patent.  After full discussion with the patient and his wife who is at the bedside, they would like to proceed with PCI.  I think the patient would have a high likelihood of a poor outcome with medical therapy as he has very critical stenosis of the ostial LAD which impacts a large area of his myocardium.  We will load the patient with clopidogrel tonight.  We will follow-up on his labs  tomorrow as he has chronic kidney disease that he understands that tomorrow will be 48 hours from his diagnostic catheterization when he will be at increased risk of contrast nephropathy.  Pending his renal function, will proceed with PCI either tomorrow or later in the week.  Sherren Mocha MD 02/03/2021 5:20 PM

## 2021-02-03 NOTE — Telephone Encounter (Signed)
Spoke to the patients son just now and he was wanting Dr. Agustin Cree to see the patient in the hospital at [facility]. I advised that Dr. Agustin Cree does not go to [facility] but he is able to see all of their notes and testing that is done there. He is going to call the hospital now to see if he can speak with the cardiologist that is caring for his father there.

## 2021-02-03 NOTE — Progress Notes (Signed)
Progress Note  Patient Name: [name] Date of Encounter: 02/03/2021  [facility] Cardiologist: Jenne Campus, MD   Subjective   Pt without CP or dyspnea  Inpatient Medications    Scheduled Meds:  amLODipine  5 mg Oral QHS   aspirin EC  81 mg Oral Daily   atorvastatin  40 mg Oral Daily   budesonide  0.5 mg Inhalation BID   ferrous sulfate  325 mg Oral Q breakfast   furosemide  40 mg Intravenous Daily   levothyroxine  50 mcg Oral Daily   metoprolol tartrate  25 mg Oral BID   multivitamin with minerals  1 tablet Oral Daily   nicotine  14 mg Transdermal Daily   pantoprazole  40 mg Oral Daily   ranolazine  500 mg Oral BID   sodium chloride flush  3 mL Intravenous Q12H   sodium chloride flush  3 mL Intravenous Q12H   vitamin B-12  1,000 mcg Oral Daily   Continuous Infusions:  sodium chloride 10 mL/hr at 02/02/21 1342   heparin 1,100 Units/hr (02/03/21 0718)   nitroGLYCERIN 15 mcg/min (02/03/21 0344)   PRN Meds: sodium chloride, acetaminophen, albuterol, HYDROcodone-acetaminophen, LORazepam, ondansetron (ZOFRAN) IV, sodium chloride flush   Vital Signs    Vitals:   02/02/21 1155 02/02/21 1820 02/02/21 1943 02/03/21 0309  BP: (!) 155/65 (!) 170/72 (!) 141/68 (!) 125/55  Pulse: 73 80 86 74  Resp:  '16 17 17  '$ Temp:  98.9 F (37.2 C) 98.6 F (37 C) 98.3 F (36.8 C)  TempSrc:  Oral Oral Oral  SpO2: 99% 95% 97% 96%  Weight:    81 kg  Height:        Intake/Output Summary (Last 24 hours) at 02/03/2021 0733 Last data filed at 02/03/2021 0315 Gross per 24 hour  Intake 855.37 ml  Output 875 ml  Net -19.63 ml    Last 3 Weights 02/03/2021 02/02/2021 02/01/2021  Weight (lbs) 178 lb 9.2 oz 183 lb 3.2 oz 186 lb 11.2 oz  Weight (kg) 81 kg 83.099 kg 84.687 kg     Telemetry-ventricular paced; personally reviewed.  Physical Exam   GEN: NAD WN WN Neck: supple Cardiac: RRR, 2/6 systolic murmur LSB; no DM, no rub Respiratory: CTA  GI: Soft, NT/ND MS: No  edema Neuro:  Grossly intact Psych: Normal affect   Labs    High Sensitivity Troponin:   Recent Labs  Lab 01/30/21 2042 01/30/21 2315  TROPONINIHS 1,689* 1,730*       Chemistry Recent Labs  Lab 01/30/21 2042 02/01/21 0222 02/02/21 0200 02/02/21 0937 02/02/21 0938 02/03/21 0217  NA 128* 133* 132* 135 135 135  K 3.6 4.0 4.0 4.0 3.9 3.7  CL 99 97* 96*  --   --  95*  CO2 18* 24 28  --   --  29  GLUCOSE 115* 107* 99  --   --  99  BUN '13 15 19  '$ --   --  19  CREATININE 1.30* 1.31* 1.43*  --   --  1.57*  CALCIUM 7.9* 8.8* 8.8*  --   --  9.2  PROT 6.2*  --   --   --   --   --   ALBUMIN 3.2*  --   --   --   --   --   AST 30  --   --   --   --   --   ALT 17  --   --   --   --   --  ALKPHOS 62  --   --   --   --   --   BILITOT 0.8  --   --   --   --   --   GFRNONAA 55* 54* 49*  --   --  43*  ANIONGAP '11 12 8  '$ --   --  11      Hematology Recent Labs  Lab 02/01/21 0222 02/02/21 0200 02/02/21 0937 02/02/21 0938 02/03/21 0217  WBC 10.6* 6.9  --   --  7.9  RBC 3.72* 3.38*  --   --  3.71*  HGB 11.5* 10.5* 10.9* 10.2* 11.5*  HCT 34.8* 31.5* 32.0* 30.0* 34.1*  MCV 93.5 93.2  --   --  91.9  MCH 30.9 31.1  --   --  31.0  MCHC 33.0 33.3  --   --  33.7  RDW 12.9 12.6  --   --  12.4  PLT 215 208  --   --  226     BNP Recent Labs  Lab 01/30/21 2042  BNP 735.1*      Radiology    CARDIAC CATHETERIZATION  Result Date: 02/02/2021 Conclusions: Multivessel coronary artery disease including 50% distal LMCA extending into the ostial LAD, where there is a heavy calcified 90% stenosis.  Mid LAD has sequential 50-80% stenoses with calcification.  Dominant LCx has sequential 40-50% proximal, mid, and distal lesions. Moderately to severely elevated left heart and right heart filling pressures. Moderate pulmonary hypertension. Moderate aortic stenosis. Normal Fick cardiac output/index. Recommendations: Cardiac surgery consultation for CABG +/- AVR. Gentle diuresis and escalation  of GDMT for acute on chronic HFrEF due to ischemic cardiomyopathy. Aggressive secondary prevention. Nelva Bush, MD [facility]  DG Chest [place] 1V same Day  Result Date: 02/01/2021 CLINICAL DATA:  Shortness of breath EXAM: PORTABLE CHEST 1 VIEW COMPARISON:  January 30, 2021 FINDINGS: Stable cardiomegaly and pacemaker. No pneumothorax. Mild patchy opacity in the right perihilar region. No overt edema. No other acute abnormalities. IMPRESSION: New mild patchy opacity in the right lung is concerning for developing infiltrate. Recommend clinical correlation and attention on follow-up. No other interval changes. Electronically Signed   By: Dorise Bullion III M.D.   On: 02/01/2021 09:11     Patient Profile     86 y.o. male with past medical history of paroxysmal atrial fibrillation, chronic diastolic congestive heart failure, chronic stage IIIa kidney disease, nonobstructive coronary disease admitted with unstable angina.  Cardiac catheterization 2018 showed 60% right, 50% LAD and 60% PDA.  Medical therapy recommended.  Last echocardiogram March 2022 showed normal LV function, moderate pulmonary hypertension, moderate left atrial enlargement, mild to moderate mitral regurgitation, mild to moderate tricuspid regurgitation, mild to moderate aortic insufficiency, moderate aortic stenosis with mean gradient 21 mmHg.  Assessment & Plan    1 CAD-cardiac catheterization reviewed.  Patient with 50% distal left main extending into the ostial LAD where there is a proximal 90% lesion.  Mid LAD has sequential 50 to 80% lesions.  Pulmonary capillary wedge pressure 30, aortic valve mean gradient 21 mmHg with aortic valve area 1.1 cm.   We will continue aspirin, nitroglycerin, heparin and statin.  Continue beta-blocker.  Patient states that he was seen by Dr. Kipp Brood yesterday and was told he was not a good surgical candidate.  No notes are in the chart at this point.  I will review his films with  interventionalists to see if he is a candidate for percutaneous intervention.  Otherwise would need  medical therapy.  2 aortic stenosis-moderate on cardiac catheterization.  Will need follow-up as an outpatient.  3 chronic diastolic congestive heart failure-pulmonary capillary wedge pressure 30 at time of catheterization.  We will continue diuresis and follow renal function.  4 chronic stage IIIa kidney disease-follow renal function closely with diuresis and following recent cardiac catheterization/dye.  5 paroxysmal atrial fibrillation-Continue beta-blocker and heparin.  Patient has been off apixaban since admission.  Will resume at discharge.  6 hypertension-continue present medications and follow.  7 prior pacemaker  8 low-grade fever-no further fevers.  Urinalysis unremarkable.  Chest x-ray with question right hilar infiltrate.  We will plan repeat PA and lateral chest x-ray prior to discharge.  He does not have symptoms of pneumonia including no productive cough.    9 Reduced LV function-amlodipine discontinued.  We will add Entresto and Jardiance once it is clear renal function is stable.  9 hyperlipidemia-given documented coronary disease increase Lipitor to 80 mg daily.  Check lipids and liver in 12 weeks.  For questions or updates, please contact [place] Please consult [email or web address] for contact info under        Signed, Kirk Ruths, MD  02/03/2021, 7:33 AM

## 2021-02-03 NOTE — Care Management Important Message (Signed)
Important Message  Patient Details  Name: [name] MRN: [id number] Date of Birth: [birth year]   Medicare Important Message Given:  Yes     [name] 02/03/2021, 11:41 AM

## 2021-02-04 ENCOUNTER — Encounter (HOSPITAL_COMMUNITY)
Admission: EM | Disposition: A | Discharge status: Home / Self Care | Payer: Self-pay | Source: Home / Self Care | Attending: Cardiology

## 2021-02-04 ENCOUNTER — Encounter (HOSPITAL_COMMUNITY): Payer: Self-pay | Admitting: Cardiovascular Disease

## 2021-02-04 ENCOUNTER — Other Ambulatory Visit (HOSPITAL_COMMUNITY): Payer: Self-pay

## 2021-02-04 DIAGNOSIS — I5023 Acute on chronic systolic (congestive) heart failure: Secondary | ICD-10-CM | POA: Diagnosis not present

## 2021-02-04 DIAGNOSIS — I214 Non-ST elevation (NSTEMI) myocardial infarction: Secondary | ICD-10-CM | POA: Diagnosis not present

## 2021-02-04 DIAGNOSIS — K635 Polyp of colon: Secondary | ICD-10-CM | POA: Insufficient documentation

## 2021-02-04 DIAGNOSIS — I2510 Atherosclerotic heart disease of native coronary artery without angina pectoris: Secondary | ICD-10-CM | POA: Diagnosis not present

## 2021-02-04 HISTORY — PX: CORONARY ATHERECTOMY: CATH118238

## 2021-02-04 HISTORY — PX: CORONARY STENT INTERVENTION: CATH118234

## 2021-02-04 HISTORY — PX: INTRAVASCULAR ULTRASOUND/IVUS: CATH118244

## 2021-02-04 LAB — BASIC METABOLIC PANEL
Anion gap: 10 (ref 5–15)
BUN: 21 mg/dL (ref 8–23)
CO2: 28 mmol/L (ref 22–32)
Calcium: 8.6 mg/dL — ABNORMAL LOW (ref 8.9–10.3)
Chloride: 95 mmol/L — ABNORMAL LOW (ref 98–111)
Creatinine, Ser: 1.54 mg/dL — ABNORMAL HIGH (ref 0.61–1.24)
GFR, Estimated: 44 mL/min — ABNORMAL LOW (ref >60)
Glucose, Bld: 110 mg/dL — ABNORMAL HIGH (ref 70–99)
Potassium: 3.4 mmol/L — ABNORMAL LOW (ref 3.5–5.1)
Sodium: 133 mmol/L — ABNORMAL LOW (ref 135–145)

## 2021-02-04 LAB — CBC
HCT: 32.9 % — ABNORMAL LOW (ref 39.0–52.0)
Hemoglobin: 11 g/dL — ABNORMAL LOW (ref 13.0–17.0)
MCH: 30.4 pg (ref 26.0–34.0)
MCHC: 33.4 g/dL (ref 30.0–36.0)
MCV: 90.9 fL (ref 80.0–100.0)
Platelets: 225 10*3/uL (ref 150–400)
RBC: 3.62 MIL/uL — ABNORMAL LOW (ref 4.22–5.81)
RDW: 12.2 % (ref 11.5–15.5)
WBC: 4.3 10*3/uL (ref 4.0–10.5)
nRBC: 0 % (ref 0.0–0.2)

## 2021-02-04 LAB — POCT ACTIVATED CLOTTING TIME: Activated Clotting Time: 433 seconds

## 2021-02-04 LAB — APTT: aPTT: 109 seconds — ABNORMAL HIGH (ref 24–36)

## 2021-02-04 LAB — HEPARIN LEVEL (UNFRACTIONATED): Heparin Unfractionated: 0.88 IU/mL — ABNORMAL HIGH (ref 0.30–0.70)

## 2021-02-04 SURGERY — CORONARY ATHERECTOMY
Anesthesia: LOCAL

## 2021-02-04 MED ORDER — ASPIRIN 81 MG PO CHEW
81.0000 mg | CHEWABLE_TABLET | ORAL | Status: AC
  Administered 2021-02-04: 81 mg via ORAL
  Filled 2021-02-04: qty 1

## 2021-02-04 MED ORDER — HYDRALAZINE HCL 20 MG/ML IJ SOLN: 10.0000 mg | INTRAMUSCULAR | Status: AC | PRN

## 2021-02-04 MED ORDER — FENTANYL CITRATE (PF) 100 MCG/2ML IJ SOLN
INTRAMUSCULAR | Status: DC | PRN
  Administered 2021-02-04 (×2): 25 ug via INTRAVENOUS

## 2021-02-04 MED ORDER — BIVALIRUDIN TRIFLUOROACETATE 250 MG IV SOLR
INTRAVENOUS | Status: AC
  Filled 2021-02-04: qty 250

## 2021-02-04 MED ORDER — IOHEXOL 350 MG/ML SOLN
INTRAVENOUS | Status: DC | PRN
  Administered 2021-02-04: 100 mL

## 2021-02-04 MED ORDER — SODIUM CHLORIDE 0.9 % IV SOLN
INTRAVENOUS | Status: DC | PRN
  Administered 2021-02-04: 1.75 mg/kg/h via INTRAVENOUS

## 2021-02-04 MED ORDER — POTASSIUM CHLORIDE CRYS ER 20 MEQ PO TBCR
40.0000 meq | EXTENDED_RELEASE_TABLET | Freq: Once | ORAL | Status: AC
  Administered 2021-02-04: 40 meq via ORAL
  Filled 2021-02-04: qty 2

## 2021-02-04 MED ORDER — NITROGLYCERIN 1 MG/10 ML FOR IR/CATH LAB
INTRA_ARTERIAL | Status: DC | PRN
  Administered 2021-02-04: 150 ug via INTRACORONARY
  Administered 2021-02-04 (×2): 200 ug via INTRACORONARY
  Administered 2021-02-04: 150 ug via INTRACORONARY

## 2021-02-04 MED ORDER — FENTANYL CITRATE (PF) 100 MCG/2ML IJ SOLN
INTRAMUSCULAR | Status: AC
  Filled 2021-02-04: qty 2

## 2021-02-04 MED ORDER — SODIUM CHLORIDE 0.9% FLUSH: 3.0000 mL | INTRAVENOUS | Status: DC | PRN

## 2021-02-04 MED ORDER — LABETALOL HCL 5 MG/ML IV SOLN: 10.0000 mg | INTRAVENOUS | Status: AC | PRN

## 2021-02-04 MED ORDER — LIDOCAINE HCL (PF) 1 % IJ SOLN
INTRAMUSCULAR | Status: AC
  Filled 2021-02-04: qty 30

## 2021-02-04 MED ORDER — BIVALIRUDIN BOLUS VIA INFUSION - CUPID
INTRAVENOUS | Status: DC | PRN
  Administered 2021-02-04: 60.75 mg via INTRAVENOUS

## 2021-02-04 MED ORDER — MIDAZOLAM HCL 2 MG/2ML IJ SOLN
INTRAMUSCULAR | Status: DC | PRN
  Administered 2021-02-04 (×2): 1 mg via INTRAVENOUS

## 2021-02-04 MED ORDER — LIDOCAINE HCL (PF) 1 % IJ SOLN
INTRAMUSCULAR | Status: DC | PRN
  Administered 2021-02-04: 15 mL

## 2021-02-04 MED ORDER — SODIUM CHLORIDE 0.9 % IV SOLN
INTRAVENOUS | Status: DC
  Administered 2021-02-04: 1000 mL via INTRAVENOUS

## 2021-02-04 MED ORDER — NITROGLYCERIN 1 MG/10 ML FOR IR/CATH LAB
INTRA_ARTERIAL | Status: AC
  Filled 2021-02-04: qty 10

## 2021-02-04 MED ORDER — HEPARIN (PORCINE) IN NACL 1000-0.9 UT/500ML-% IV SOLN
INTRAVENOUS | Status: DC | PRN
  Administered 2021-02-04 (×2): 500 mL

## 2021-02-04 MED ORDER — SODIUM CHLORIDE 0.9% FLUSH
3.0000 mL | Freq: Two times a day (BID) | INTRAVENOUS | Status: DC
  Administered 2021-02-04: 3 mL via INTRAVENOUS

## 2021-02-04 MED ORDER — SODIUM CHLORIDE 0.9 % IV SOLN: 250.0000 mL | INTRAVENOUS | Status: DC | PRN

## 2021-02-04 MED ORDER — LIDOCAINE-EPINEPHRINE 1 %-1:100000 IJ SOLN
INTRAMUSCULAR | Status: DC | PRN
  Administered 2021-02-04 (×2): 5 mL

## 2021-02-04 MED ORDER — APIXABAN 5 MG PO TABS
5.0000 mg | ORAL_TABLET | Freq: Two times a day (BID) | ORAL | Status: DC
  Administered 2021-02-05: 5 mg via ORAL
  Filled 2021-02-04: qty 1

## 2021-02-04 MED ORDER — HEPARIN (PORCINE) IN NACL 1000-0.9 UT/500ML-% IV SOLN
INTRAVENOUS | Status: AC
  Filled 2021-02-04: qty 1000

## 2021-02-04 MED ORDER — SODIUM CHLORIDE 0.9 % IV SOLN: INTRAVENOUS | Status: AC

## 2021-02-04 MED ORDER — MIDAZOLAM HCL 2 MG/2ML IJ SOLN
INTRAMUSCULAR | Status: AC
  Filled 2021-02-04: qty 2

## 2021-02-04 SURGICAL SUPPLY — 29 items
BALL SAPPHIRE NC24 2.75X22 (BALLOONS) ×2
BALLN SAPPHIRE 2.5X20 (BALLOONS) ×2
BALLN SAPPHIRE 3.0X12 (BALLOONS) ×2
BALLN SAPPHIRE ~~LOC~~ 3.25X15 (BALLOONS) ×2 IMPLANT
BALLN SAPPHIRE ~~LOC~~ 4.0X10 (BALLOONS) ×2 IMPLANT
BALLOON SAPPHIRE 2.5X20 (BALLOONS) ×1 IMPLANT
BALLOON SAPPHIRE 3.0X12 (BALLOONS) ×1 IMPLANT
BALLOON SAPPHIRE NC24 2.75X22 (BALLOONS) ×1 IMPLANT
CATH OPTICROSS HD (CATHETERS) ×2 IMPLANT
CATH VISTA GUIDE 7FR XB4 (CATHETERS) ×2 IMPLANT
CATH VISTA GUIDE 7FRXB LAD 3.5 (CATHETERS) ×2 IMPLANT
CLOSURE PERCLOSE PROSTYLE (VASCULAR PRODUCTS) ×2 IMPLANT
CROWN DIAMONDBACK CLASSIC 1.25 (BURR) ×2 IMPLANT
KIT ENCORE 26 ADVANTAGE (KITS) ×2 IMPLANT
KIT HEART LEFT (KITS) ×2 IMPLANT
KIT HEMO VALVE WATCHDOG (MISCELLANEOUS) ×2 IMPLANT
KIT MICROINTRODUCER 5F 7206 (SHEATH) ×2 IMPLANT
LUBRICANT VIPERSLIDE CORONARY (MISCELLANEOUS) ×2 IMPLANT
PACK CARDIAC CATHETERIZATION (CUSTOM PROCEDURE TRAY) ×2 IMPLANT
SHEATH PINNACLE 7F 10CM (SHEATH) ×2 IMPLANT
SHEATH PROBE COVER 6X72 (BAG) ×2 IMPLANT
SLED PULL BACK IVUS (MISCELLANEOUS) ×2 IMPLANT
STENT ONYX FRONTIER 2.5X34 (Permanent Stent) ×2 IMPLANT
STENT ONYX FRONTIER 3.5X18 (Permanent Stent) ×2 IMPLANT
TRANSDUCER W/STOPCOCK (MISCELLANEOUS) ×2 IMPLANT
TUBING CIL FLEX 10 FLL-RA (TUBING) ×2 IMPLANT
WIRE COUGAR XT STRL 190CM (WIRE) ×4 IMPLANT
WIRE EMERALD 3MM-J .035X150CM (WIRE) ×2 IMPLANT
WIRE VIPERWIRE COR FLEX .012 (WIRE) ×4 IMPLANT

## 2021-02-04 NOTE — Progress Notes (Signed)
Patient arrived from cath lab with femostop in place and not inflated. Dr. Burt Knack discussed with staff to keep it on for 2 hours for oozing from sheath track that has not responded to manual holding or epi injections. Monitored with no change. Remains a level 0 with no bleeding, bruising or hematoma noted. Removed femostop at [id number], rolled off insertion site and site remains level 0 with no noted complications. Dressing applied. CSM's wnl and right pedal pulse present.

## 2021-02-04 NOTE — Progress Notes (Addendum)
Progress Note  Patient Name: [name] Date of Encounter: 02/04/2021  [facility] Cardiologist: Jenne Campus, MD   Subjective   No CP or  dyspnea  Inpatient Medications    Scheduled Meds:  aspirin EC  81 mg Oral Daily   atorvastatin  80 mg Oral Daily   budesonide  0.5 mg Inhalation BID   clopidogrel  75 mg Oral Daily   ferrous sulfate  325 mg Oral Q breakfast   furosemide  40 mg Intravenous Daily   levothyroxine  50 mcg Oral Daily   metoprolol tartrate  25 mg Oral BID   multivitamin with minerals  1 tablet Oral Daily   nicotine  14 mg Transdermal Daily   pantoprazole  40 mg Oral Daily   ranolazine  500 mg Oral BID   sodium chloride flush  3 mL Intravenous Q12H   sodium chloride flush  3 mL Intravenous Q12H   vitamin B-12  1,000 mcg Oral Daily   Continuous Infusions:  sodium chloride 10 mL/hr at 02/02/21 1342   heparin 1,200 Units/hr (02/04/21 0616)   nitroGLYCERIN 15 mcg/min (02/04/21 0616)   PRN Meds: sodium chloride, acetaminophen, albuterol, HYDROcodone-acetaminophen, LORazepam, ondansetron (ZOFRAN) IV, sodium chloride flush   Vital Signs    Vitals:   02/03/21 0823 02/03/21 1419 02/03/21 2100 02/04/21 0451  BP:  (!) 142/59 (!) 155/57 (!) 128/49  Pulse:  77  62  Resp:  '18 18 16  '$ Temp:  (!) 100.5 F (38.1 C) 98.5 F (36.9 C) 98.3 F (36.8 C)  TempSrc:  Axillary Oral Oral  SpO2: 95% 98% 96% 92%  Weight:      Height:        Intake/Output Summary (Last 24 hours) at 02/04/2021 0720 Last data filed at 02/04/2021 0616 Gross per 24 hour  Intake 644.56 ml  Output 600 ml  Net 44.56 ml    Last 3 Weights 02/03/2021 02/02/2021 02/01/2021  Weight (lbs) 178 lb 9.2 oz 183 lb 3.2 oz 186 lb 11.2 oz  Weight (kg) 81 kg 83.099 kg 84.687 kg     Telemetry-sinus with ventricular pacing; personally reviewed.  Physical Exam   GEN: NAD Neck: supple, JVP difficult to assess Cardiac: RRR, 2/6 systolic murmur LSB Respiratory: CTA; no wheeze GI: Soft, NT/ND,  no masses MS: No edema Neuro:  No focal findings Psych: Normal affect   Labs    High Sensitivity Troponin:   Recent Labs  Lab 01/30/21 2042 01/30/21 2315  TROPONINIHS 1,689* 1,730*       Chemistry Recent Labs  Lab 01/30/21 2042 02/01/21 0222 02/02/21 0200 02/02/21 0937 02/02/21 0938 02/03/21 0217  NA 128* 133* 132* 135 135 135  K 3.6 4.0 4.0 4.0 3.9 3.7  CL 99 97* 96*  --   --  95*  CO2 18* 24 28  --   --  29  GLUCOSE 115* 107* 99  --   --  99  BUN '13 15 19  '$ --   --  19  CREATININE 1.30* 1.31* 1.43*  --   --  1.57*  CALCIUM 7.9* 8.8* 8.8*  --   --  9.2  PROT 6.2*  --   --   --   --   --   ALBUMIN 3.2*  --   --   --   --   --   AST 30  --   --   --   --   --   ALT 17  --   --   --   --   --  ALKPHOS 62  --   --   --   --   --   BILITOT 0.8  --   --   --   --   --   GFRNONAA 55* 54* 49*  --   --  43*  ANIONGAP '11 12 8  '$ --   --  11      Hematology Recent Labs  Lab 02/02/21 0200 02/02/21 0937 02/02/21 0938 02/03/21 0217 02/04/21 0224  WBC 6.9  --   --  7.9 4.3  RBC 3.38*  --   --  3.71* 3.62*  HGB 10.5*   < > 10.2* 11.5* 11.0*  HCT 31.5*   < > 30.0* 34.1* 32.9*  MCV 93.2  --   --  91.9 90.9  MCH 31.1  --   --  31.0 30.4  MCHC 33.3  --   --  33.7 33.4  RDW 12.6  --   --  12.4 12.2  PLT 208  --   --  226 225   < > = values in this interval not displayed.     BNP Recent Labs  Lab 01/30/21 2042  BNP 735.1*      Radiology    CARDIAC CATHETERIZATION  Result Date: 02/02/2021 Conclusions: Multivessel coronary artery disease including 50% distal LMCA extending into the ostial LAD, where there is a heavy calcified 90% stenosis.  Mid LAD has sequential 50-80% stenoses with calcification.  Dominant LCx has sequential 40-50% proximal, mid, and distal lesions. Moderately to severely elevated left heart and right heart filling pressures. Moderate pulmonary hypertension. Moderate aortic stenosis. Normal Fick cardiac output/index. Recommendations: Cardiac  surgery consultation for CABG +/- AVR. Gentle diuresis and escalation of GDMT for acute on chronic HFrEF due to ischemic cardiomyopathy. Aggressive secondary prevention. Nelva Bush, MD [facility]    Patient Profile     86 y.o. male with past medical history of paroxysmal atrial fibrillation, chronic diastolic congestive heart failure, chronic stage IIIa kidney disease, nonobstructive coronary disease admitted with unstable angina.  Cardiac catheterization 2018 showed 60% right, 50% LAD and 60% PDA.  Medical therapy recommended.  Last echocardiogram March 2022 showed normal LV function, moderate pulmonary hypertension, moderate left atrial enlargement, mild to moderate mitral regurgitation, mild to moderate tricuspid regurgitation, mild to moderate aortic insufficiency, moderate aortic stenosis with mean gradient 21 mmHg.  Assessment & Plan    1 CAD-cardiac catheterization films reviewed with Dr Burt Knack.  Patient with 50% distal left main extending into the ostial LAD where there is a proximal 90% lesion.  Mid LAD has sequential 50 to 80% lesions.  Pulmonary capillary wedge pressure 30, aortic valve mean gradient 21 mmHg with aortic valve area 1.1 cm.   We will continue aspirin, plavix, nitroglycerin, metoprolol, heparin and statin.   Patient felt not to be a good surgical candidate due to comorbidities.  Plan is to proceed with high risk PCI of LAD.  I would not hydrate prior to procedure given elevated pulmonary capillary wedge pressure at time of diagnostic catheterization.  2 aortic stenosis-moderate on cardiac catheterization.  Will need follow-up as an outpatient.  3 chronic diastolic congestive heart failure-pulmonary capillary wedge pressure 30 at time of catheterization.  However he is asymptomatic at present.  We will hold further diuresis in anticipation of upcoming PCI/dye load.  4 chronic stage IIIa kidney disease-Cr unchanged this AM; follow closely following PCI.  5 paroxysmal  atrial fibrillation-Continue beta-blocker and heparin.  Resume apixaban at discharge.  6 hypertension-continue present  medications and follow.  7 prior pacemaker  8 low-grade fever-Tmax 100.5 yesterday. Afebrile this AM; WBC normal. No localizing symptoms. Urinalysis unremarkable.  Chest x-ray with question right hilar infiltrate.  We will plan repeat PA and lateral chest x-ray prior to discharge.    9 Reduced LV function-amlodipine discontinued.  We will add Entresto and Jardiance once it is clear renal function is stable.  9 hyperlipidemia-continue lipitor 80 mg daily.  For questions or updates, please contact [place] Please consult [email or web address] for contact info under        Signed, Kirk Ruths, MD  02/04/2021, 7:20 AM

## 2021-02-04 NOTE — TOC Benefit Eligibility Note (Signed)
Patient Teacher, English as a foreign language completed.    The patient is currently admitted and upon discharge could be taking Jardiance 10 mg.  The current 30 day co-pay is, $139.07 due to being in Coverage Gap (donut hole).   The patient is currently admitted and upon discharge could be taking Farxiga 10 mg.  Non Formulary  The patient is insured through [employer], [place] Patient Advocate Specialist [place] Team Direct Number: [phone]  Fax: [phone]

## 2021-02-04 NOTE — Progress Notes (Signed)
Heart Failure Stewardship Pharmacist Progress Note   PCP: Lillard Anes, MD PCP-Cardiologist: Jenne Campus, MD    HPI:  86 yo M with PMH of afib, CHF, CKD IIIa, asthma, and nonobstructive CAD. He presented to the ED on 8/12 with chest pain and shortness of breath. CXR on admission with stable cardiomegaly and mild bibasilar atelectasis. An ECHO was doen on 8/13 and LVEF was 35-40% (previously 60-65% in 08/2020) with G3DD. Taken for [facility] on 8/15 and found to have multivessel CAD and moderate aortic stenosis with elevated filling pressures and preserved cardiac output. Referred to cardiac surgery for CABG +/- AVR evaluation but was turned down given age and comorbidities. He was then seen by interventional cardiology and is undergoing high risk PCI with atherectomy in cath lab today.   Current HF Medications: Metoprolol tartrate 25 mg BID  Prior to admission HF Medications: None  Pertinent Lab Values: Serum creatinine 1.54, BUN 21, Potassium 3.4, Sodium 133, BNP 735  Vital Signs: Weight: 178 lbs (admission weight: 188 lbs) Blood pressure: 120-140/70s  Heart rate: 60s   Medication Assistance / Insurance Benefits Check: Does the patient have prescription insurance?  Yes Type of insurance plan: [employer]  Outpatient Pharmacy:  Prior to admission outpatient pharmacy: [facility] Is the patient willing to use [facility] at discharge? Yes Is the patient willing to transition their outpatient pharmacy to utilize a [facility] outpatient pharmacy?   Pending    Assessment: 1. Acute on chronic systolic CHF (EF [id number]), due to ICM. NYHA class II symptoms. - Continue metoprolol tartrate 25 mg BID. Consider transitioning to metoprolol XL for HFrEF prior to discharge - Consider starting Entresto pending renal function stabilization post cath - Consider starting spironolactone if BP allows or add SGLT2i prior to discharge   Plan: 1) Medication changes  recommended at this time: - None pending cath today  2) Patient assistance: - Patient is in coverage gap (donut hole). Initial copays will be higher until he's out of the coverage gap.  - Wilder Glade is not on patient's formulary - Jardiance copay appears to be $0 once out of coverage gap - Entresto requires prior authorization   - Can help enroll in patient assistance if copays are unaffordable with coverage gap  3)  Education  - To be completed prior to discharge  Kerby Nora, PharmD, BCPS Heart Failure Cytogeneticist Phone [phone]

## 2021-02-04 NOTE — TOC Progression Note (Signed)
Transition of Care [facility]) - Progression Note    Patient Details  Name: [name] MRN: [id number] Date of Birth: January 24, 1935  Transition of [facility]) CM/SW Contact  Zenon Mayo, RN Phone Number: 02/04/2021, 12:48 PM  Clinical Narrative:    NCM spoke with wife, she states they are active with [facility] and he has a wound vac at home that he uses.  They would like to stay with [facility].  NCM notified Amy with [facility].          Expected Discharge Plan and Services                                                 Social Determinants of Health (SDOH) Interventions    Readmission Risk Interventions No flowsheet data found.

## 2021-02-04 NOTE — Progress Notes (Signed)
[place] for Heparin (Apixaban on hold) Indication: chest pain/ACS and atrial fibrillation  No Known Allergies  Patient Measurements: Height: '5\' 2"'$  (157.5 cm) Weight: 81 kg (178 lb 9.2 oz) IBW/kg (Calculated) : 54.6   Vital Signs: Temp: 98.3 F (36.8 C) (08/17 0451) Temp Source: Oral (08/17 0451) BP: 128/49 (08/17 0451) Pulse Rate: 62 (08/17 0451)  Labs: Recent Labs    02/02/21 0200 02/02/21 0937 02/02/21 0938 02/03/21 0217 02/03/21 1621 02/04/21 0224  HGB 10.5*   < > 10.2* 11.5*  --  11.0*  HCT 31.5*   < > 30.0* 34.1*  --  32.9*  PLT 208  --   --  226  --  225  APTT 80*  --   --  48* 56* 109*  HEPARINUNFRC >1.10*  --   --   --   --  0.88*  CREATININE 1.43*  --   --  1.57*  --  1.54*   < > = values in this interval not displayed.     Estimated Creatinine Clearance: 33.5 mL/min (A) (by C-G formula based on SCr of 1.54 mg/dL (H)).   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 moderate nonobstructive CAD by cath in 2018.   Coronary atherosclerosis of native coronary artery 04/09/2019   Mid RCA lesion, 60 %stenosed. Prox LAD lesion, 40 %stenosed. Prox LAD to Mid LAD lesion, 50 %stenosed. LPDA lesion, 60 %stenosed.   Disorder of carotid artery ([place]) 12/29/2016   Dyslipidemia 12/29/2016   Essential hypertension, benign 08/15/2017   Exertional angina ([place]) 02/08/2017   Gastritis and gastroduodenitis    GERD (gastroesophageal reflux disease)    Hypothyroidism    Iron deficiency anemia    Late effect of  cerebrovascular accident (CVA) 05/25/2017   Late effects of cerebrovascular disease 05/25/2017   Malignant neoplasm of prostate ([place]) 08/17/2019   Mixed hyperlipidemia 12/14/2019   Obesity 01/25/2018   Osteoarthritis of both knees 08/17/2019   Pacemaker 08/08/2019   PAF (paroxysmal atrial fibrillation) (HCC)    Paroxysmal atrial fibrillation ([place]) 04/01/2015   Peripheral vascular disease ([place])    Polyp of cecum    Precordial pain 04/23/2015   Preinfarction syndrome ([place]) 02/08/2017   Right-sided carotid artery disease ([place]) 12/29/2016   Routine general medical examination at a health care facility 04/01/2015   Sequela of cerebrovascular accident 05/25/2017   Symptomatic anemia 04/12/2019   Assessment: 86 y/o M who presents to the ED with several days of chest pain, troponin is elevated. On apixaban PTA for afib, last dose >12 hours prior to arrival (01/30/21 '@0900'$ ). He is now s/p cath with multivessel CAD. Plan for PCI this week  -aPTT= 109, hg= 11    Goal of Therapy:  Heparin level 0.3-0.7 units/ml aPTT 66-102 secs Monitor platelets by anticoagulation protocol: Yes   Plan:  -Decrease heparin to 1150 units/hr -Heparin level, aPTT and CBC in am  Hildred Laser, PharmD Clinical Pharmacist **Pharmacist phone directory can now be found on [email or web address] ([email or web address]).  Listed under [place].

## 2021-02-05 ENCOUNTER — Telehealth: Payer: Self-pay

## 2021-02-05 ENCOUNTER — Inpatient Hospital Stay (HOSPITAL_COMMUNITY): Payer: PPO

## 2021-02-05 ENCOUNTER — Encounter (HOSPITAL_COMMUNITY): Payer: Self-pay | Admitting: Internal Medicine

## 2021-02-05 ENCOUNTER — Other Ambulatory Visit (HOSPITAL_COMMUNITY): Payer: Self-pay

## 2021-02-05 ENCOUNTER — Ambulatory Visit: Payer: PPO | Admitting: Cardiology

## 2021-02-05 DIAGNOSIS — N183 Chronic kidney disease, stage 3 unspecified: Secondary | ICD-10-CM

## 2021-02-05 DIAGNOSIS — I214 Non-ST elevation (NSTEMI) myocardial infarction: Secondary | ICD-10-CM | POA: Diagnosis not present

## 2021-02-05 DIAGNOSIS — I350 Nonrheumatic aortic (valve) stenosis: Secondary | ICD-10-CM

## 2021-02-05 DIAGNOSIS — I255 Ischemic cardiomyopathy: Secondary | ICD-10-CM

## 2021-02-05 LAB — CBC
HCT: 33.6 % — ABNORMAL LOW (ref 39.0–52.0)
Hemoglobin: 11.5 g/dL — ABNORMAL LOW (ref 13.0–17.0)
MCH: 31 pg (ref 26.0–34.0)
MCHC: 34.2 g/dL (ref 30.0–36.0)
MCV: 90.6 fL (ref 80.0–100.0)
Platelets: 238 10*3/uL (ref 150–400)
RBC: 3.71 MIL/uL — ABNORMAL LOW (ref 4.22–5.81)
RDW: 12.4 % (ref 11.5–15.5)
WBC: 6.6 10*3/uL (ref 4.0–10.5)
nRBC: 0 % (ref 0.0–0.2)

## 2021-02-05 LAB — BASIC METABOLIC PANEL
Anion gap: 11 (ref 5–15)
BUN: 17 mg/dL (ref 8–23)
CO2: 27 mmol/L (ref 22–32)
Calcium: 9.1 mg/dL (ref 8.9–10.3)
Chloride: 95 mmol/L — ABNORMAL LOW (ref 98–111)
Creatinine, Ser: 1.28 mg/dL — ABNORMAL HIGH (ref 0.61–1.24)
GFR, Estimated: 56 mL/min — ABNORMAL LOW (ref >60)
Glucose, Bld: 96 mg/dL (ref 70–99)
Potassium: 4 mmol/L (ref 3.5–5.1)
Sodium: 133 mmol/L — ABNORMAL LOW (ref 135–145)

## 2021-02-05 MED ORDER — ASPIRIN 81 MG PO TBEC
81.0000 mg | DELAYED_RELEASE_TABLET | Freq: Every day | ORAL | 11 refills | Status: AC
  Filled 2021-02-05: qty 30, 30d supply, fill #0

## 2021-02-05 MED ORDER — NICOTINE 14 MG/24HR TD PT24
14.0000 mg | MEDICATED_PATCH | Freq: Every day | TRANSDERMAL | 0 refills | Status: DC
  Filled 2021-02-05: qty 28, 28d supply, fill #0

## 2021-02-05 MED ORDER — PANTOPRAZOLE SODIUM 40 MG PO TBEC
40.0000 mg | DELAYED_RELEASE_TABLET | Freq: Every day | ORAL | 3 refills | Status: AC
  Filled 2021-02-05: qty 90, 90d supply, fill #0

## 2021-02-05 MED ORDER — CLOPIDOGREL BISULFATE 75 MG PO TABS
75.0000 mg | ORAL_TABLET | Freq: Every day | ORAL | 3 refills | Status: AC
  Filled 2021-02-05: qty 90, 90d supply, fill #0

## 2021-02-05 MED ORDER — ATORVASTATIN CALCIUM 80 MG PO TABS
80.0000 mg | ORAL_TABLET | Freq: Every day | ORAL | 3 refills | Status: DC
  Filled 2021-02-05: qty 90, 90d supply, fill #0

## 2021-02-05 MED ORDER — MORPHINE SULFATE (PF) 2 MG/ML IV SOLN
1.0000 mg | INTRAVENOUS | Status: DC | PRN
Start: 2021-02-05 — End: 2021-02-05
  Filled 2021-02-05: qty 1

## 2021-02-05 MED ORDER — FUROSEMIDE 40 MG PO TABS
40.0000 mg | ORAL_TABLET | Freq: Every day | ORAL | 11 refills | Status: DC
  Filled 2021-02-05: qty 30, 30d supply, fill #0

## 2021-02-05 MED ORDER — METOPROLOL TARTRATE 25 MG PO TABS
25.0000 mg | ORAL_TABLET | Freq: Two times a day (BID) | ORAL | 3 refills | Status: DC
  Filled 2021-02-05: qty 180, 90d supply, fill #0

## 2021-02-05 NOTE — Progress Notes (Signed)
Progress Note  Patient Name: [name] Date of Encounter: 02/05/2021  [facility] Cardiologist: Jenne Campus, MD   Subjective   Pt denies CP or dyspnea  Inpatient Medications    Scheduled Meds:  apixaban  5 mg Oral BID   aspirin EC  81 mg Oral Daily   atorvastatin  80 mg Oral Daily   budesonide  0.5 mg Inhalation BID   clopidogrel  75 mg Oral Daily   ferrous sulfate  325 mg Oral Q breakfast   levothyroxine  50 mcg Oral Daily   metoprolol tartrate  25 mg Oral BID   multivitamin with minerals  1 tablet Oral Daily   nicotine  14 mg Transdermal Daily   pantoprazole  40 mg Oral Daily   ranolazine  500 mg Oral BID   sodium chloride flush  3 mL Intravenous Q12H   sodium chloride flush  3 mL Intravenous Q12H   sodium chloride flush  3 mL Intravenous Q12H   sodium chloride flush  3 mL Intravenous Q12H   vitamin B-12  1,000 mcg Oral Daily   Continuous Infusions:  sodium chloride 10 mL/hr at 02/02/21 1342   sodium chloride     PRN Meds: sodium chloride, sodium chloride, acetaminophen, albuterol, HYDROcodone-acetaminophen, LORazepam, morphine injection, ondansetron (ZOFRAN) IV, sodium chloride flush, sodium chloride flush   Vital Signs    Vitals:   02/04/21 2001 02/04/21 2004 02/04/21 2020 02/05/21 0500  BP: (!) 131/45  (!) 100/57 (!) 122/35  Pulse:   73   Resp: 18  19   Temp: 97.7 F (36.5 C)  97.7 F (36.5 C) 98.4 F (36.9 C)  TempSrc: Oral  Oral Oral  SpO2:  96% 94%   Weight:      Height:        Intake/Output Summary (Last 24 hours) at 02/05/2021 0848 Last data filed at 02/05/2021 0422 Gross per 24 hour  Intake 768.1 ml  Output --  Net 768.1 ml    Last 3 Weights 02/03/2021 02/02/2021 02/01/2021  Weight (lbs) 178 lb 9.2 oz 183 lb 3.2 oz 186 lb 11.2 oz  Weight (kg) 81 kg 83.099 kg 84.687 kg     Telemetry-sinus with ventricular pacing; personally reviewed.  Physical Exam   GEN: NAD WD WN Neck: supple Cardiac: RRR, 2/6 systolic murmur LSB; no  gallop Respiratory: CTA GI: Soft, NT/ND MS: No edema Neuro:  Grossly intact Psych: Normal affect   Labs    High Sensitivity Troponin:   Recent Labs  Lab 01/30/21 2042 01/30/21 2315  TROPONINIHS 1,689* 1,730*       Chemistry Recent Labs  Lab 01/30/21 2042 02/01/21 0222 02/03/21 0217 02/04/21 0224 02/05/21 0314  NA 128*   < > 135 133* 133*  K 3.6   < > 3.7 3.4* 4.0  CL 99   < > 95* 95* 95*  CO2 18*   < > '29 28 27  '$ GLUCOSE 115*   < > 99 110* 96  BUN 13   < > '19 21 17  '$ CREATININE 1.30*   < > 1.57* 1.54* 1.28*  CALCIUM 7.9*   < > 9.2 8.6* 9.1  PROT 6.2*  --   --   --   --   ALBUMIN 3.2*  --   --   --   --   AST 30  --   --   --   --   ALT 17  --   --   --   --  ALKPHOS 62  --   --   --   --   BILITOT 0.8  --   --   --   --   GFRNONAA 55*   < > 43* 44* 56*  ANIONGAP 11   < > '11 10 11   '$ < > = values in this interval not displayed.      Hematology Recent Labs  Lab 02/03/21 0217 02/04/21 0224 02/05/21 0314  WBC 7.9 4.3 6.6  RBC 3.71* 3.62* 3.71*  HGB 11.5* 11.0* 11.5*  HCT 34.1* 32.9* 33.6*  MCV 91.9 90.9 90.6  MCH 31.0 30.4 31.0  MCHC 33.7 33.4 34.2  RDW 12.4 12.2 12.4  PLT 226 225 238     BNP Recent Labs  Lab 01/30/21 2042  BNP 735.1*      Radiology    CARDIAC CATHETERIZATION  Result Date: 02/04/2021   Dist LM lesion is 50% stenosed.   Ost LAD lesion is 90% stenosed.   Mid LAD-1 lesion is 50% stenosed.   Mid LAD-2 lesion is 80% stenosed.   Mid Cx lesion is 50% stenosed.   Ost Cx to Prox Cx lesion is 50% stenosed.   Dist RCA lesion is 50% stenosed.   LPAV lesion is 40% stenosed.   A drug-eluting stent was successfully placed using a STENT ONYX FRONTIER 2.5X34.   A drug-eluting stent was successfully placed using a STENT ONYX FRONTIER 3.5X18.   Post intervention, there is a 0% residual stenosis.   Post intervention, there is a 0% residual stenosis.   Post intervention, there is a 0% residual stenosis.   Post intervention, there is a 0% residual  stenosis. Successful orbital atherectomy and stenting of the mid LAD, ostial LAD, and distal left mainstem using 2 resolute Onyx overlapping drug-eluting stents with intravascular ultrasound guidance Recommend: Resume apixaban tomorrow Clopidogrel 75 mg daily times minimum of 12 months Aspirin 81 mg daily x30 days Patient should remain on some type of antiplatelet therapy beyond 12 months (either long-term clopidogrel or transition to aspirin 81 mg daily) Repeat metabolic panel tomorrow morning inpatient with stage IIIb chronic kidney disease     Patient Profile     86 y.o. male with past medical history of paroxysmal atrial fibrillation, chronic diastolic congestive heart failure, chronic stage IIIa kidney disease, nonobstructive coronary disease admitted with unstable angina.  Cardiac catheterization 2018 showed 60% right, 50% LAD and 60% PDA.  Medical therapy recommended.  Last echocardiogram March 2022 showed normal LV function, moderate pulmonary hypertension, moderate left atrial enlargement, mild to moderate mitral regurgitation, mild to moderate tricuspid regurgitation, mild to moderate aortic insufficiency, moderate aortic stenosis with mean gradient 21 mmHg.  Assessment & Plan    1 CAD-patient is now status post PCI of distal left main, ostial LAD and mid LAD.  Continue aspirin 81 mg daily for 30 days (patient will require apixaban for history of paroxysmal atrial fibrillation).  Continue Plavix 75 mg daily long-term.  Continue low-dose beta-blocker and statin.    2 aortic stenosis-moderate on cardiac catheterization.  Will need follow-up as an outpatient.  3 chronic diastolic congestive heart failure-pulmonary capillary wedge pressure 30 at time of catheterization.  He remains asymptomatic today.  We will plan to resume Lasix 20 mg daily in 3 days (I would like to avoid diuresis given recent dye load and chronic renal insufficiency).  4 chronic stage IIIa kidney disease-creatinine unchanged  this morning.  We will need to repeat bmet on Monday to make sure his renal  function is stable.  5 paroxysmal atrial fibrillation-patient remains in sinus rhythm.  We will continue beta-blocker.  Resume apixaban 5 mg twice daily.  6 hypertension-continue present medications and follow.  7 prior pacemaker  8 low-grade fever-no further fevers.  We will repeat PA and lateral chest x-ray prior to discharge given question of right lung infiltrate.  9 Reduced LV function-amlodipine discontinued.  We will add Entresto and Jardiance as an outpatient once it is clear renal function is stable.  9 hyperlipidemia-continue lipitor 80 mg daily.  If chest x-ray unremarkable we will plan discharge later today.  Check potassium and renal function on Monday.  We will arrange follow-up in [place] in approximately 2 weeks. Greater than 30 minutes PA and physician time. D2  For questions or updates, please contact [place] Please consult [email or web address] for contact info under        Signed, Kirk Ruths, MD  02/05/2021, 8:48 AM

## 2021-02-05 NOTE — Progress Notes (Signed)
CARDIAC REHAB PHASE I   PRE:  Rate/Rhythm: 81 paced  BP:  Supine: 97/76  leg  Sitting:   Standing:    SaO2: 97%RA  MODE:  Ambulation: 100 ft   POST:  Rate/Rhythm: 107 paced  BP:  Supine: 125/56  Sitting:   Standing:    SaO2: 97%RA [id number] Helped pt get his pants on and asked RN to give pain med prior to walk. Pt walked with cane outside in hall and stated he needed to have BM. Assisted to bathroom and waited until finished and helped him get clean. Then we walked 100  ft on RA with his cane and my hand on his arm. Pt stated he had not been able to walk this far prior to procedure. To bed with bed alarm and he got breathing treatment. Denied CP. Sats were good on RA. Gave pt MI booklet and stressed importance of plavix with stents. Reviewed NTG use stressing to call 911 if no relief with second one. Reviewed MI restrictions. Encouraged him to watch salt and to weigh every day with low EF. Pt stated he does weigh every day. Gave low sodium diets and discussed 2000 mg restriction. Also gave heart healthy diet. Encouraged walking as tolerated with cane but did not give written guidelines due to mobility issues. Has scooter at home he uses as needed. Encouraged tobacco cessation and gave handout (snuff). 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 Discussed CRP 2 [place]. Pt not interested. Order to meet protocol. Bed alarm on and call bell in reach.   Graylon Good, RN BSN  02/05/2021 9:22 AM

## 2021-02-05 NOTE — Discharge Summary (Signed)
Discharge Summary    Patient ID: [name] MRN: [id number]; DOB: 1935/04/16  Admit date: 01/30/2021 Discharge date: 02/05/2021  PCP:  Lillard Anes, MD   [facility] Providers Cardiologist:  Jenne Campus, MD  Electrophysiologist:  Constance Haw, MD  {    Discharge Diagnoses    Principal Problem:   NSTEMI (non-ST elevated myocardial infarction) [facility]) Active Problems:   Paroxysmal atrial fibrillation [facility])   Peripheral vascular disease ([place])   Coronary atherosclerosis of native coronary artery   Acute on chronic HFrEF (heart failure with reduced ejection fraction) ([place])   Iron deficiency anemia   CKD (chronic kidney disease), stage III ([place])   Pacemaker   Mixed hyperlipidemia   Ischemic cardiomyopathy   Moderate aortic stenosis   Diagnostic Studies/Procedures    Cardiac cath 02/04/21     Dist LM lesion is 50% stenosed.   Ost LAD lesion is 90% stenosed.   Mid LAD-1 lesion is 50% stenosed.   Mid LAD-2 lesion is 80% stenosed.   Mid Cx lesion is 50% stenosed.   Ost Cx to Prox Cx lesion is 50% stenosed.   Dist RCA lesion is 50% stenosed.   LPAV lesion is 40% stenosed.   A drug-eluting stent was successfully placed using a STENT ONYX FRONTIER 2.5X34.   A drug-eluting stent was successfully placed using a STENT ONYX FRONTIER 3.5X18.   Post intervention, there is a 0% residual stenosis.   Post intervention, there is a 0% residual stenosis.   Post intervention, there is a 0% residual stenosis.   Post intervention, there is a 0% residual stenosis.   Successful orbital atherectomy and stenting of the mid LAD, ostial LAD, and distal left mainstem using 2 resolute Onyx overlapping drug-eluting stents with intravascular ultrasound guidance   Recommend: Resume apixaban tomorrow Clopidogrel 75 mg daily times minimum of 12 months Aspirin 81 mg daily x30 days Patient should remain on some type of antiplatelet therapy beyond 12 months (either long-term  clopidogrel or transition to aspirin 81 mg daily) Repeat metabolic panel tomorrow morning inpatient with stage IIIb chronic kidney disease Antiplatelet/Anticoag Recommend to resume Apixaban, at currently prescribed dose and frequency on 02/05/2021. Recommend concurrent antiplatelet therapy of Aspirin 81 mg for 1 month and Clopidogrel '75mg'$  daily for 12 months .   Coronary Diagrams  Diagnostic Dominance: Left Intervention      Cardiac cath 02/02/21 Conclusions: Multivessel coronary artery disease including 50% distal LMCA extending into the ostial LAD, where there is a heavy calcified 90% stenosis.  Mid LAD has sequential 50-80% stenoses with calcification.  Dominant LCx has sequential 40-50% proximal, mid, and distal lesions.  Moderately to severely elevated left heart and right heart filling pressures. Moderate pulmonary hypertension. Moderate aortic stenosis. Normal Fick cardiac output/index.   Recommendations: Cardiac surgery consultation for CABG +/- AVR. Gentle diuresis and escalation of GDMT for acute on chronic HFrEF due to ischemic cardiomyopathy. Aggressive secondary prevention.   Nelva Bush, MD [facility]  Echo 01/31/21 1. Left ventricular ejection fraction, by estimation, is 35 to 40%. The  left ventricle has moderately decreased function. The left ventricle  demonstrates regional wall motion abnormalities (see scoring  diagram/findings for description). Left ventricular   diastolic parameters are consistent with Grade III diastolic dysfunction  (restrictive).   2. Right ventricular systolic function is normal. The right ventricular  size is normal. There is moderately elevated pulmonary artery systolic  pressure. The estimated right ventricular systolic pressure is 123XX123 mmHg.   3. A small pericardial  effusion is present. The pericardial effusion is  posterior and lateral to the left ventricle. There is no evidence of  cardiac tamponade.   4. The mitral  valve is degenerative. Mild to moderate mitral valve  regurgitation. No evidence of mitral stenosis.   5. Tricuspid valve regurgitation is moderate to severe.   6. The aortic valve is tricuspid with moderate calcifications. V max 2.1  m/s, MG 10 mmHG, EOA 1.24 cm2, DI 0.39. SV noted to be low in setting of  reduced EF. Suspect this is moderate aortic stenosis is present similar to  prior echo. The aortic valve is  tricuspid. There is moderate calcification of the aortic valve. There is  moderate thickening of the aortic valve. Aortic valve regurgitation is  mild. Moderate aortic valve stenosis.   7. The inferior vena cava is normal in size with greater than 50%  respiratory variability, suggesting right atrial pressure of 3 mmHg.   Comparison(s): Changes from prior study are noted. EF is now 35-40%. WMA  concerning for LAD infarction.   _____________   History of Present Illness     [name] is a 86 y.o. male with history of nonobstructive coronary sclerotic heart disease, CHB s/p permanent pacemaker followed by EP, carotid artery disease, A. fib on Eliquis, iron deficiency anemia, moderate AS, HFpEF,h/o CVA, CKD stage 3 who presented with chest pain and shortness of breath.  Chest pain was worse on exertion and relieved with SL NTG.  When the pain worsened he went to the ER on 01/30/21.   Hospital Course     Consultants: Cardiothoracic surgery  In the ER HS troponin was elevated to 1,730 and IV heparin was started. Eliqus was held. He was continued on aspirin, Nitro gtt. Metoprolol '25mg'$  BID was started. He had transient fevers, although infectious work-up was negative. No leukocytosis. He underwent cardiac cath which showed multivessel CAD including 50% stenosis of the distal LMCA extending into the ostial LAD where there is a heavily calcified 90% stenosis. The mid LAD has sequential 50-80% stenosis with calcification. Dominant left Cx has 40-50% proximal, mid, and distal lesions.  Also noted cardia cath with moderate pulmonary hypertension, moderate AS, and ischemic cardiomyopathy. CT surgery was consulted for possible CABG and AVR. He was seen by Dr Kipp Brood and was told he was not a good surgical candidate. Catheter images were reviewed by internationalists, and he underwent high risk PCI with successful orbital atherectomy and stenting of distal Left main (DESx2), ostial LAD and mid LAD. He was started on DAPT with Aspirin and Plavix. He remained in SR and he was restarted on Eliquis '5mg'$  BID. Plan for triple therapy for 1 month followed by dual therapy with Plavix and Eliquis thereafter . Due to elevated wedge pressure he was given IV lasix, however this was held for CKD and patient remained asymptomatic. Plan to follow aortic stenosis as outpatient. Continue with GDMT at follow-up as kidney function allows. Follow-up CXR showed CHF, plan to start lasix '40mg'$  daily tomorrow 02/06/21.  The patient was evaluated by Dr. Stanford Breed on 02/05/21 and felt to be stable for discharge.   Did the patient have an acute coronary syndrome (MI, NSTEMI, STEMI, etc) this admission?:  Yes                               AHA/ACC Clinical Performance & Quality Measures: Aspirin prescribed? - Yes ADP Receptor Inhibitor (Plavix/Clopidogrel, Brilinta/Ticagrelor or Effient/Prasugrel) prescribed (  includes medically managed patients)? - Yes Beta Blocker prescribed? - Yes High Intensity Statin (Lipitor 40-'80mg'$  or Crestor 20-'40mg'$ ) prescribed? - Yes EF assessed during THIS hospitalization? - Yes For EF <40%, was ACEI/ARB prescribed? - No - Reason:  transient hypotension For EF <40%, Aldosterone Antagonist (Spironolactone or Eplerenone) prescribed? - No - Reason:  transient hypotension Cardiac Rehab Phase II ordered (including medically managed patients)? - Yes      _____________  Discharge Vitals Blood pressure (!) 145/72, pulse 70, temperature 97.6 F (36.4 C), temperature source Oral, resp. rate 18,  height '5\' 2"'$  (1.575 m), weight 81 kg, SpO2 97 %.  Filed Weights   02/01/21 0500 02/02/21 0415 02/03/21 0309  Weight: 84.7 kg 83.1 kg 81 kg    Labs & Radiologic Studies    CBC Recent Labs    02/04/21 0224 02/05/21 0314  WBC 4.3 6.6  HGB 11.0* 11.5*  HCT 32.9* 33.6*  MCV 90.9 90.6  PLT 225 [id number]   Basic Metabolic Panel Recent Labs    02/04/21 0224 02/05/21 0314  NA 133* 133*  K 3.4* 4.0  CL 95* 95*  CO2 28 27  GLUCOSE 110* 96  BUN 21 17  CREATININE 1.54* 1.28*  CALCIUM 8.6* 9.1   Liver Function Tests No results for input(s): AST, ALT, ALKPHOS, BILITOT, PROT, ALBUMIN in the last 72 hours. No results for input(s): LIPASE, AMYLASE in the last 72 hours. High Sensitivity Troponin:   Recent Labs  Lab 01/30/21 2042 01/30/21 2315  TROPONINIHS 1,689* 1,730*    BNP Invalid input(s): POCBNP D-Dimer No results for input(s): DDIMER in the last 72 hours. Hemoglobin A1C No results for input(s): HGBA1C in the last 72 hours. Fasting Lipid Panel No results for input(s): CHOL, HDL, LDLCALC, TRIG, CHOLHDL, LDLDIRECT in the last 72 hours. Thyroid Function Tests No results for input(s): TSH, T4TOTAL, T3FREE, THYROIDAB in the last 72 hours.  Invalid input(s): FREET3 _____________  DG Chest 2 View  Result Date: 02/05/2021 CLINICAL DATA:  Dyspnea,cardiac hx,pacemaker,cad,CHF,asthma EXAM: CHEST - 2 VIEW COMPARISON:  02/01/2021 FINDINGS: Some worsening of left retrocardiac consolidation/atelectasis. Mild perihilar interstitial prominence. Fluid inter lobar fissures. Heart size stable. Aortic Atherosclerosis (ICD10-170.0). Stable left subclavian dual lead transvenous pacemaker. Small bilateral pleural effusions.  No pneumothorax. Possible mild compression deformity in the upper thoracic spine. Anterior vertebral endplate spurring at multiple levels in the lower thoracic spine. At least a 2 compression deformities near the thoracolumbar junction are noted, age indeterminate. IMPRESSION: 1.  Interstitial edema and pleural effusions suggesting CHF. 2. Left retrocardiac consolidation/atelectasis. 3. Thoracolumbar compression deformities, age indeterminate. Electronically Signed   By: Lucrezia Europe M.D.   On: 02/05/2021 12:26   CARDIAC CATHETERIZATION  Result Date: 02/04/2021   Dist LM lesion is 50% stenosed.   Ost LAD lesion is 90% stenosed.   Mid LAD-1 lesion is 50% stenosed.   Mid LAD-2 lesion is 80% stenosed.   Mid Cx lesion is 50% stenosed.   Ost Cx to Prox Cx lesion is 50% stenosed.   Dist RCA lesion is 50% stenosed.   LPAV lesion is 40% stenosed.   A drug-eluting stent was successfully placed using a STENT ONYX FRONTIER 2.5X34.   A drug-eluting stent was successfully placed using a STENT ONYX FRONTIER 3.5X18.   Post intervention, there is a 0% residual stenosis.   Post intervention, there is a 0% residual stenosis.   Post intervention, there is a 0% residual stenosis.   Post intervention, there is a 0% residual stenosis. Successful orbital atherectomy  and stenting of the mid LAD, ostial LAD, and distal left mainstem using 2 resolute Onyx overlapping drug-eluting stents with intravascular ultrasound guidance Recommend: Resume apixaban tomorrow Clopidogrel 75 mg daily times minimum of 12 months Aspirin 81 mg daily x30 days Patient should remain on some type of antiplatelet therapy beyond 12 months (either long-term clopidogrel or transition to aspirin 81 mg daily) Repeat metabolic panel tomorrow morning inpatient with stage IIIb chronic kidney disease   CARDIAC CATHETERIZATION  Result Date: 02/02/2021 Conclusions: Multivessel coronary artery disease including 50% distal LMCA extending into the ostial LAD, where there is a heavy calcified 90% stenosis.  Mid LAD has sequential 50-80% stenoses with calcification.  Dominant LCx has sequential 40-50% proximal, mid, and distal lesions. Moderately to severely elevated left heart and right heart filling pressures. Moderate pulmonary hypertension.  Moderate aortic stenosis. Normal Fick cardiac output/index. Recommendations: Cardiac surgery consultation for CABG +/- AVR. Gentle diuresis and escalation of GDMT for acute on chronic HFrEF due to ischemic cardiomyopathy. Aggressive secondary prevention. Nelva Bush, MD [facility]  DG Chest Port 1 View  Result Date: 01/30/2021 CLINICAL DATA:  Left-sided chest pain x1 week. EXAM: PORTABLE CHEST 1 VIEW COMPARISON:  April 09, 2019 FINDINGS: There is a dual lead AICD. Stable, chronic appearing increased lung markings are noted with mild, stable areas of bibasilar atelectasis. There is no evidence of a pleural effusion or pneumothorax. The cardiac silhouette is moderately enlarged and unchanged in size. A chronic deformity is seen involving the mid to distal right clavicle. IMPRESSION: Stable cardiomegaly with mild bibasilar atelectasis. Electronically Signed   By: Virgina Norfolk M.D.   On: 01/30/2021 21:19   DG Chest Port 1V same Day  Result Date: 02/01/2021 CLINICAL DATA:  Shortness of breath EXAM: PORTABLE CHEST 1 VIEW COMPARISON:  January 30, 2021 FINDINGS: Stable cardiomegaly and pacemaker. No pneumothorax. Mild patchy opacity in the right perihilar region. No overt edema. No other acute abnormalities. IMPRESSION: New mild patchy opacity in the right lung is concerning for developing infiltrate. Recommend clinical correlation and attention on follow-up. No other interval changes. Electronically Signed   By: Dorise Bullion III M.D.   On: 02/01/2021 09:11   ECHOCARDIOGRAM LIMITED  Result Date: 01/31/2021    ECHOCARDIOGRAM LIMITED REPORT   Patient Name:   [name] Date of Exam: 01/31/2021 Medical Rec #:  [id number]        Height:       62.0 in Accession #:    [id number]       Weight:       188.9 lb Date of Birth:  [birth year]        BSA:          1.866 m Patient Age:    74 years         BP:           124/67 mmHg Patient Gender: M                HR:           68 bpm. Exam Location:   Inpatient Procedure: Limited Echo, Color Doppler and Cardiac Doppler Indications:    acute myocardial infarction  History:        Patient has prior history of Echocardiogram examinations, most                 recent 08/27/2020. Chronic kidney disease, Arrythmias:complete                 heart  block; Risk Factors:Hypertension and Dyslipidemia.  Sonographer:    Johny Chess RDCS Referring Phys: [id number] Letitia Libra A WILMOT IMPRESSIONS  1. Left ventricular ejection fraction, by estimation, is 35 to 40%. The left ventricle has moderately decreased function. The left ventricle demonstrates regional wall motion abnormalities (see scoring diagram/findings for description). Left ventricular  diastolic parameters are consistent with Grade III diastolic dysfunction (restrictive).  2. Right ventricular systolic function is normal. The right ventricular size is normal. There is moderately elevated pulmonary artery systolic pressure. The estimated right ventricular systolic pressure is 123XX123 mmHg.  3. A small pericardial effusion is present. The pericardial effusion is posterior and lateral to the left ventricle. There is no evidence of cardiac tamponade.  4. The mitral valve is degenerative. Mild to moderate mitral valve regurgitation. No evidence of mitral stenosis.  5. Tricuspid valve regurgitation is moderate to severe.  6. The aortic valve is tricuspid with moderate calcifications. V max 2.1 m/s, MG 10 mmHG, EOA 1.24 cm2, DI 0.39. SV noted to be low in setting of reduced EF. Suspect this is moderate aortic stenosis is present similar to prior echo. The aortic valve is tricuspid. There is moderate calcification of the aortic valve. There is moderate thickening of the aortic valve. Aortic valve regurgitation is mild. Moderate aortic valve stenosis.  7. The inferior vena cava is normal in size with greater than 50% respiratory variability, suggesting right atrial pressure of 3 mmHg. Comparison(s): Changes from prior study are  noted. EF is now 35-40%. WMA concerning for LAD infarction. FINDINGS  Left Ventricle: Left ventricular ejection fraction, by estimation, is 35 to 40%. The left ventricle has moderately decreased function. The left ventricle demonstrates regional wall motion abnormalities. The left ventricular internal cavity size was normal in size. There is no left ventricular hypertrophy. Left ventricular diastolic parameters are consistent with Grade III diastolic dysfunction (restrictive).  LV Wall Scoring: The apical septal segment, apical inferior segment, and apex are akinetic. Right Ventricle: The right ventricular size is normal. No increase in right ventricular wall thickness. Right ventricular systolic function is normal. There is moderately elevated pulmonary artery systolic pressure. The tricuspid regurgitant velocity is 3.54 m/s, and with an assumed right atrial pressure of 3 mmHg, the estimated right ventricular systolic pressure is 123XX123 mmHg. Pericardium: A small pericardial effusion is present. The pericardial effusion is posterior and lateral to the left ventricle. There is no evidence of cardiac tamponade. Presence of pericardial fat pad. Mitral Valve: The mitral valve is degenerative in appearance. Mild to moderate mitral annular calcification. Mild to moderate mitral valve regurgitation. No evidence of mitral valve stenosis. Tricuspid Valve: Tricuspid valve regurgitation is moderate to severe. Aortic Valve: The aortic valve is tricuspid with moderate calcifications. V max 2.1 m/s, MG 10 mmHG, EOA 1.24 cm2, DI 0.39. SV noted to be low in setting of reduced EF. Suspect this is moderate aortic stenosis is present similar to prior echo. The aortic  valve is tricuspid. There is moderate calcification of the aortic valve. There is moderate thickening of the aortic valve. Aortic valve regurgitation is mild. Aortic regurgitation PHT measures 342 msec. Moderate aortic stenosis is present. Aortic valve mean gradient  measures 10.0 mmHg. Aortic valve peak gradient measures 18.7 mmHg. Aortic valve area, by VTI measures 1.24 cm. Aorta: The aortic root and ascending aorta are structurally normal, with no evidence of dilitation. Venous: The inferior vena cava is normal in size with greater than 50% respiratory variability, suggesting right atrial pressure of 3 mmHg. Additional Comments:  A device lead is visualized in the right atrium and right ventricle. LEFT VENTRICLE PLAX 2D LVIDd:         5.10 cm     Diastology LVIDs:         3.90 cm     LV e' medial:    4.79 cm/s LV PW:         1.10 cm     LV E/e' medial:  26.5 LV IVS:        1.00 cm     LV e' lateral:   5.00 cm/s LVOT diam:     2.00 cm     LV E/e' lateral: 25.4 LV SV:         55 LV SV Index:   29 LVOT Area:     3.14 cm  LV Volumes (MOD) LV vol d, MOD A2C: 79.8 ml LV vol d, MOD A4C: 82.4 ml LV vol s, MOD A2C: 46.1 ml LV vol s, MOD A4C: 52.2 ml LV SV MOD A2C:     33.7 ml LV SV MOD A4C:     82.4 ml LV SV MOD BP:      32.5 ml IVC IVC diam: 2.00 cm LEFT ATRIUM         Index LA diam:    4.50 cm 2.41 cm/m  AORTIC VALVE AV Area (Vmax):    1.10 cm AV Area (Vmean):   1.07 cm AV Area (VTI):     1.24 cm AV Vmax:           216.00 cm/s AV Vmean:          147.000 cm/s AV VTI:            0.445 m AV Peak Grad:      18.7 mmHg AV Mean Grad:      10.0 mmHg LVOT Vmax:         75.80 cm/s LVOT Vmean:        50.100 cm/s LVOT VTI:          0.175 m LVOT/AV VTI ratio: 0.39 AI PHT:            342 msec  AORTA Ao Asc diam: 2.90 cm MITRAL VALVE                TRICUSPID VALVE MV Area (PHT): 3.51 cm     TR Peak grad:   50.1 mmHg MV Decel Time: 216 msec     TR Vmax:        354.00 cm/s MV E velocity: 127.00 cm/s MV A velocity: 49.40 cm/s   SHUNTS MV E/A ratio:  2.57         Systemic VTI:  0.18 m                             Systemic Diam: 2.00 cm Eleonore Chiquito MD Electronically signed by Eleonore Chiquito MD Signature Date/Time: 01/31/2021/5:11:08 PM    Final    Disposition   Pt is being discharged home today  in good condition.  Follow-up Plans & Appointments     Follow-up Information     [facility] Follow up on 02/10/2021.   Specialty: Cardiology Contact information: [address] [id number] [place]        [place] Office Follow up on 02/09/2021.   Specialty: Cardiology Why: Go to clinic for labs, BMET Contact  information: [address] [place] [phone]               Discharge Instructions     (HEART FAILURE PATIENTS) Call MD:  Anytime you have any of the following symptoms: 1) 3 pound weight gain in 24 hours or 5 pounds in 1 week 2) shortness of breath, with or without a dry hacking cough 3) swelling in the hands, feet or stomach 4) if you have to sleep on extra pillows at night in order to breathe.   Complete by: As directed    Amb Referral to Cardiac Rehabilitation   Complete by: As directed    Referring to [place] [facility] 2   Diagnosis:  Coronary Stents NSTEMI     After initial evaluation and assessments completed: Virtual Based Care may be provided alone or in conjunction with Phase 2 Cardiac Rehab based on patient barriers.: Yes   Call MD for:  persistant nausea and vomiting   Complete by: As directed    Call MD for:  redness, tenderness, or signs of infection (pain, swelling, redness, odor or green/yellow discharge around incision site)   Complete by: As directed    Call MD for:  severe uncontrolled pain   Complete by: As directed    Call MD for:  temperature >100.4   Complete by: As directed    Diet - low sodium heart healthy   Complete by: As directed    Discharge instructions   Complete by: As directed    No driving for 1 week. No lifting over 10 lbs for 2 weeks. No sexual activity for 2 weeks.  Keep procedure site clean & dry. If you notice increased pain, swelling, bleeding or pus, call/return!  You may  shower, but no soaking baths/hot tubs/pools for 1 week.   Increase activity slowly   Complete by: As directed        Discharge Medications   Allergies as of 02/05/2021   No Known Allergies      Medication List     STOP taking these medications    amLODipine 5 MG tablet Commonly known as: NORVASC   isosorbide mononitrate 60 MG 24 hr tablet Commonly known as: IMDUR   omeprazole 20 MG capsule Commonly known as: PRILOSEC Replaced by: pantoprazole 40 MG tablet       TAKE these medications    albuterol 108 (90 Base) MCG/ACT inhaler Commonly known as: VENTOLIN HFA INHALE 2 PUFFS BY MOUTH EVERY 6 HOURS AS NEEDED What changed: reasons to take this   apixaban 5 MG Tabs tablet Commonly known as: Eliquis Take 1 tablet (5 mg total) by mouth 2 (two) times daily.   aspirin 81 MG EC tablet Take 1 tablet (81 mg total) by mouth daily. Swallow whole. Start taking on: February 06, 2021   atorvastatin 80 MG tablet Commonly known as: LIPITOR Take 1 tablet (80 mg total) by mouth daily. Start taking on: February 06, 2021 What changed:  medication strength how much to take   clopidogrel 75 MG tablet Commonly known as: PLAVIX Take 1 tablet (75 mg total) by mouth daily. Start taking on: February 06, 2021   FeroSul 325 (65 FE) MG tablet Generic drug: ferrous sulfate TAKE 1 TABLET(325 MG) BY MOUTH TWICE DAILY   fluticasone 110 MCG/ACT inhaler Commonly known as: Flovent HFA Inhale 2 puffs into the lungs 2 (two) times daily.   furosemide 40 MG tablet Commonly known as: Lasix Take 1 tablet (40 mg  total) by mouth daily. Start taking on: February 06, 2021   HYDROcodone-acetaminophen 5-325 MG tablet Commonly known as: NORCO/VICODIN Take 1 tablet by mouth 2 (two) times daily as needed (pain).   levothyroxine 50 MCG tablet Commonly known as: SYNTHROID TAKE 1 TABLET BY MOUTH DAILY   metoprolol tartrate 25 MG tablet Commonly known as: LOPRESSOR Take 1 tablet (25 mg total) by mouth  2 (two) times daily.   multivitamin with minerals Tabs tablet Take 1 tablet by mouth daily.   nicotine 14 mg/24hr patch Commonly known as: NICODERM CQ - dosed in mg/24 hours Place 1 patch (14 mg total) onto the skin daily. Start taking on: February 06, 2021   nitroGLYCERIN 0.4 MG SL tablet Commonly known as: NITROSTAT DISSOLVE 1 TABLET UNDER THE TONGUE EVERY 5 MINUTES AS NEEDED FOR CHEST PAIN. MAX OF 3 DOSES IN 15 MINUTES   pantoprazole 40 MG tablet Commonly known as: PROTONIX Take 1 tablet (40 mg total) by mouth daily. Start taking on: February 06, 2021 Replaces: omeprazole 20 MG capsule   ranolazine 500 MG 12 hr tablet Commonly known as: RANEXA TAKE 1 TABLET(500 MG) BY MOUTH TWICE DAILY What changed: See the new instructions.   vitamin B-12 1000 MCG tablet Commonly known as: CYANOCOBALAMIN Take 1,000 mcg by mouth daily.           Outstanding Labs/Studies   BMET on Monday, February 09, 2021  Duration of Discharge Encounter   Greater than 30 minutes including physician time.  Signed, Chiyoko Torrico Ninfa Meeker, PA-C 02/05/2021, 2:24 PM

## 2021-02-05 NOTE — Progress Notes (Signed)
Heart Failure Nurse Navigator Progress Note  PCP: Lillard Anes, MD PCP-Cardiologist: Nelta Numbers., MD Admission Diagnosis: NSTEMI Admitted from: home with spouse  Presentation:   [name] presented on 8/12 with chest pain and SOB. Cardiac cath on 8/15, MVD. Not surgical candidate. Pt and spouse interactive with interview process. Pt completed 6th grade, has some difficulty reading. Explained plan for HV TOC and HF education. Pt stated he is ready to get IVs out and go home.   ECHO/ LVEF: 35-40%, G3DD.   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 moderate nonobstructive CAD by cath in 2018.   Coronary atherosclerosis of native coronary artery 04/09/2019   Mid RCA lesion, 60 %stenosed. Prox LAD lesion, 40 %stenosed. Prox LAD to Mid LAD lesion, 50 %stenosed. LPDA lesion, 60 %stenosed.   Disorder of carotid artery ([place]) 12/29/2016   Dyslipidemia 12/29/2016   Essential hypertension, benign 08/15/2017   Exertional angina ([place]) 02/08/2017   Gastritis and gastroduodenitis    GERD (gastroesophageal reflux disease)    Hypothyroidism    Iron deficiency anemia    Late effect of cerebrovascular accident (CVA) 05/25/2017   Late effects of cerebrovascular disease 05/25/2017   Malignant neoplasm of prostate ([place]) 08/17/2019   Mixed hyperlipidemia 12/14/2019   Obesity 01/25/2018   Osteoarthritis of both knees 08/17/2019   Pacemaker 08/08/2019   PAF (paroxysmal atrial fibrillation) (HCC)    Paroxysmal atrial fibrillation ([place]) 04/01/2015   Peripheral vascular  disease ([place])    Polyp of cecum    Precordial pain 04/23/2015   Preinfarction syndrome ([place]) 02/08/2017   Right-sided carotid artery disease ([place]) 12/29/2016   Routine general medical examination at a health care facility 04/01/2015   Sequela of cerebrovascular accident 05/25/2017   Symptomatic anemia 04/12/2019     Social History   Socioeconomic History   Marital status: Married    Spouse name: Chapman Trettin   Number of children: 2   Years of education: Not on file   Highest education level: 6th grade  Occupational History   Occupation: Retired  Tobacco Use   Smoking status: Never   Smokeless tobacco: Current    Types: Snuff   Tobacco comments:    1 can per day  Vaping Use   Vaping Use: Never used  Substance and Sexual Activity   Alcohol use: Yes    Alcohol/week: 3.0 standard drinks    Types: 3 Cans of beer per week    Comment: regular use 4 times a week   Drug use: No   Sexual activity: Not Currently    Partners: Female    Birth control/protection: None  Other Topics Concern   Not on file  Social History Narrative   Not on file   Social Determinants of Health   Financial Resource Strain: Low Risk    Difficulty of Paying Living Expenses: Not hard at all  Food Insecurity: No Food Insecurity   Worried About Charity fundraiser in the Last Year: Never true   [place] in the Last Year: Never  true  Transportation Needs: No Transportation Needs   Lack of Transportation (Medical): No   Lack of Transportation (Non-Medical): No  Physical Activity: Sufficiently Active   Days of Exercise per Week: 4 days   Minutes of Exercise per Session: 60 min  Stress: No Stress Concern Present   Feeling of Stress : Only a little  Social Connections: Moderately Isolated   Frequency of Communication with Friends and Family: Never   Frequency of Social Gatherings with Friends and Family: Never   Attends Religious Services: 1 to 4 times per year   Active Member of [employer] or  Organizations: No   Attends Music therapist: Never   Marital Status: Married    High Risk Criteria for Readmission and/or Poor Patient Outcomes: Heart failure hospital admissions (last 6 months): 1  No Show rate: 2% Difficult social situation: no Demonstrates medication adherence: yes Primary Language: English Literacy level: 6th grade, some difficulty reading.   Education Assessment and Provision:  Detailed education and instructions provided on heart failure disease management including the following:  Signs and symptoms of Heart Failure When to call the physician Importance of daily weights Low sodium diet Fluid restriction Medication management Anticipated future follow-up appointments  Patient education given on each of the above topics.  Patient acknowledges understanding via teach back method and acceptance of all instructions.  Education Materials:  "Living Better With Heart Failure" Booklet, HF zone tool, & Daily Weight Tracker Tool.  Patient has scale at home: yes Patient has pill box at home: yes   Barriers of Care:   -new HF dx  Considerations/Referrals:   Referral made to Heart Failure Pharmacist Stewardship: yes, appreciated. Referral made to Heart & Vascular TOC clinic: yes, 8/23 @ 3pm.  Items for Follow-up on DC/TOC: -medication optimization -medication cost -medication compliance -continue education  Pricilla Holm, MSN, RN Heart Failure Nurse Navigator [phone]

## 2021-02-05 NOTE — Telephone Encounter (Signed)
-----   Message from Graham, PA-C sent at 02/05/2021  2:04 PM EDT ----- Regarding: hospital follow-up labs Pt needs BMET on Monday, February 09, 2021 for CKD. Please order and schedule and let the patient know. Thanks

## 2021-02-05 NOTE — Progress Notes (Addendum)
Heart Failure Stewardship Pharmacist Progress Note   PCP: Lillard Anes, MD PCP-Cardiologist: Jenne Campus, MD    HPI:  86 yo M with PMH of afib, CHF, CKD IIIa, asthma, and nonobstructive CAD. He presented to the ED on 8/12 with chest pain and shortness of breath. CXR on admission with stable cardiomegaly and mild bibasilar atelectasis. An ECHO was doen on 8/13 and LVEF was 35-40% (previously 60-65% in 08/2020) with G3DD. Taken for [facility] on 8/15 and found to have multivessel CAD and moderate aortic stenosis with elevated filling pressures and preserved cardiac output. Referred to cardiac surgery for CABG +/- AVR evaluation but was turned down given age and comorbidities. He was then seen by interventional cardiology and is s/p successful high risk PCI to mid LAD, ostial LAD, and distal LM with atherectomy on 8/17.   Current HF Medications: Metoprolol tartrate 25 mg BID  Prior to admission HF Medications: None  Pertinent Lab Values: Serum creatinine 1.28, BUN 17, Potassium 4.0, Sodium 133, BNP 735  Vital Signs: Weight: 178 lbs (admission weight: 188 lbs) Blood pressure: 120/60s  Heart rate: 60-70s   Medication Assistance / Insurance Benefits Check: Does the patient have prescription insurance?  Yes Type of insurance plan: [employer]  Outpatient Pharmacy:  Prior to admission outpatient pharmacy: [facility] Is the patient willing to use [facility] at discharge? Yes Is the patient willing to transition their outpatient pharmacy to utilize a [facility] outpatient pharmacy?   Pending    Assessment: 1. Acute on chronic systolic CHF (EF 123456), due to ICM s/p high risk PCI on 8/17. NYHA class II symptoms. - Continue metoprolol tartrate 25 mg BID. Consider transitioning to metoprolol XL for HFrEF prior to discharge - Consider starting Entresto 24/26 mg BID at follow up - Consider starting spironolactone  - Consider adding Jardiance 10 mg daily at  follow up    Plan: 1) Medication changes recommended at this time: - Change metoprolol tartrate to metoprolol XL   2) Patient assistance: - Patient is in coverage gap (donut hole). Initial copays will be higher until he's out of the coverage gap.  - Wilder Glade is not on patient's formulary - Jardiance copay appears to be $0 once out of coverage gap - Entresto requires prior authorization   - Can help enroll in patient assistance if copays are unaffordable with coverage gap - HF TOC appt scheduled for 8/23  3)  Education  - To be completed prior to discharge  Kerby Nora, PharmD, BCPS Heart Failure Stewardship Pharmacist Phone [phone]

## 2021-02-05 NOTE — Telephone Encounter (Signed)
Attempted to call Pt. No answer.  Pt has appt with [facility] on 02/10/21.  Added need for BMP to office note.

## 2021-02-09 ENCOUNTER — Telehealth: Payer: Self-pay | Admitting: *Deleted

## 2021-02-09 ENCOUNTER — Other Ambulatory Visit: Payer: Self-pay

## 2021-02-09 DIAGNOSIS — N183 Chronic kidney disease, stage 3 unspecified: Secondary | ICD-10-CM

## 2021-02-09 NOTE — Telephone Encounter (Signed)
*  STAT* If patient is at the pharmacy, call can be transferred to refill team.   1. Which medications need to be refilled? (please list name of each medication and dose if known) Nitroglycerine  2. Which pharmacy/location (including street and city if local pharmacy) is medication to be sent to? [facility], [place]  3. Do they need a 30 day or 90 day supply? Asking for refill, just got out of [facility] got rx in July for 25 with 3 refills. Will let pt know they have refills. Don't need new Rx.

## 2021-02-10 ENCOUNTER — Telehealth (HOSPITAL_COMMUNITY): Payer: Self-pay

## 2021-02-10 ENCOUNTER — Telehealth: Payer: Self-pay

## 2021-02-10 ENCOUNTER — Encounter (HOSPITAL_COMMUNITY): Payer: PPO

## 2021-02-10 ENCOUNTER — Encounter (HOSPITAL_COMMUNITY): Payer: Self-pay

## 2021-02-10 ENCOUNTER — Ambulatory Visit (HOSPITAL_COMMUNITY)
Admission: RE | Admit: 2021-02-10 | Discharge: 2021-02-10 | Disposition: A | Discharge status: Home / Self Care | Payer: PPO | Source: Ambulatory Visit | Attending: Internal Medicine | Admitting: Internal Medicine

## 2021-02-10 ENCOUNTER — Other Ambulatory Visit (HOSPITAL_COMMUNITY): Payer: Self-pay

## 2021-02-10 ENCOUNTER — Other Ambulatory Visit: Payer: Self-pay

## 2021-02-10 VITALS — BP 130/70 | HR 70 | Wt 174.6 lb

## 2021-02-10 DIAGNOSIS — Z950 Presence of cardiac pacemaker: Secondary | ICD-10-CM | POA: Insufficient documentation

## 2021-02-10 DIAGNOSIS — R531 Weakness: Secondary | ICD-10-CM | POA: Diagnosis not present

## 2021-02-10 DIAGNOSIS — I5042 Chronic combined systolic (congestive) and diastolic (congestive) heart failure: Secondary | ICD-10-CM | POA: Insufficient documentation

## 2021-02-10 DIAGNOSIS — Z8249 Family history of ischemic heart disease and other diseases of the circulatory system: Secondary | ICD-10-CM | POA: Diagnosis not present

## 2021-02-10 DIAGNOSIS — Z79899 Other long term (current) drug therapy: Secondary | ICD-10-CM | POA: Diagnosis not present

## 2021-02-10 DIAGNOSIS — Z7901 Long term (current) use of anticoagulants: Secondary | ICD-10-CM | POA: Diagnosis not present

## 2021-02-10 DIAGNOSIS — I313 Pericardial effusion (noninflammatory): Secondary | ICD-10-CM | POA: Diagnosis not present

## 2021-02-10 DIAGNOSIS — N1832 Chronic kidney disease, stage 3b: Secondary | ICD-10-CM | POA: Insufficient documentation

## 2021-02-10 DIAGNOSIS — I480 Paroxysmal atrial fibrillation: Secondary | ICD-10-CM | POA: Diagnosis not present

## 2021-02-10 DIAGNOSIS — N1831 Chronic kidney disease, stage 3a: Secondary | ICD-10-CM

## 2021-02-10 DIAGNOSIS — I2510 Atherosclerotic heart disease of native coronary artery without angina pectoris: Secondary | ICD-10-CM | POA: Insufficient documentation

## 2021-02-10 DIAGNOSIS — Z8673 Personal history of transient ischemic attack (TIA), and cerebral infarction without residual deficits: Secondary | ICD-10-CM | POA: Insufficient documentation

## 2021-02-10 DIAGNOSIS — R42 Dizziness and giddiness: Secondary | ICD-10-CM | POA: Diagnosis not present

## 2021-02-10 DIAGNOSIS — I442 Atrioventricular block, complete: Secondary | ICD-10-CM | POA: Diagnosis not present

## 2021-02-10 DIAGNOSIS — I130 Hypertensive heart and chronic kidney disease with heart failure and stage 1 through stage 4 chronic kidney disease, or unspecified chronic kidney disease: Secondary | ICD-10-CM | POA: Diagnosis not present

## 2021-02-10 DIAGNOSIS — Z7982 Long term (current) use of aspirin: Secondary | ICD-10-CM | POA: Diagnosis not present

## 2021-02-10 DIAGNOSIS — Z955 Presence of coronary angioplasty implant and graft: Secondary | ICD-10-CM | POA: Diagnosis not present

## 2021-02-10 DIAGNOSIS — Z7902 Long term (current) use of antithrombotics/antiplatelets: Secondary | ICD-10-CM | POA: Diagnosis not present

## 2021-02-10 DIAGNOSIS — I255 Ischemic cardiomyopathy: Secondary | ICD-10-CM | POA: Diagnosis not present

## 2021-02-10 LAB — BASIC METABOLIC PANEL
BUN/Creatinine Ratio: 16 (ref 10–24)
BUN: 24 mg/dL (ref 8–27)
CO2: 25 mmol/L (ref 20–29)
Calcium: 9.2 mg/dL (ref 8.6–10.2)
Chloride: 93 mmol/L — ABNORMAL LOW (ref 96–106)
Creatinine, Ser: 1.54 mg/dL — ABNORMAL HIGH (ref 0.76–1.27)
Glucose: 130 mg/dL — ABNORMAL HIGH (ref 65–99)
Potassium: 4.1 mmol/L (ref 3.5–5.2)
Sodium: 133 mmol/L — ABNORMAL LOW (ref 134–144)
eGFR: 44 mL/min/{1.73_m2} — ABNORMAL LOW (ref >59)

## 2021-02-10 MED ORDER — FUROSEMIDE 20 MG PO TABS: 20.0000 mg | ORAL_TABLET | Freq: Every day | ORAL | 11 refills | Status: DC

## 2021-02-10 MED ORDER — EMPAGLIFLOZIN 10 MG PO TABS: 10.0000 mg | ORAL_TABLET | Freq: Every day | ORAL | 11 refills | Status: AC

## 2021-02-10 NOTE — Chronic Care Management (AMB) (Signed)
Chronic Care Management Pharmacy Assistant   Name: [name]  MRN: [id number] DOB: 08-27-1934  Reason for Encounter: Hospital Follow up   Hospital visits:  Medication Reconciliation was completed by comparing discharge summary, patient's EMR and Pharmacy list, and upon discussion with patient.  Admitted to the hospital on 01/30/2021 due to NSTEMI (non-ST elevated myocardial infarction). Discharge date was 02/05/2021. Discharged from [facility]?Medications Started at [facility] Discharge:?? -started None  Medication Changes at Hospital Discharge: -Changed None  Medications Discontinued at Hospital Discharge: -Stopped None   Medications that remain the same after Hospital Discharge:??  -All other medications will remain the same.    02/11/2021- Called patient, spoke with wife checking on patients status and if there were any medication or medical needs that I could help with or relay to Primary care office with Donette Larry, Pharm D. Per wife Lucita Ferrara, patient is doing well, still has some weakness but is improving. Wife confirmed patient has all medication needed. Wife confirmed follow up visit scheduled with Dr Henrene Pastor on 02/17/2021 at 9:00 AM. Wife also confirmed Heart Device Remote check 02/16/2021 at 10:10 AM.   Medications: Outpatient Encounter Medications as of 02/10/2021  Medication Sig   albuterol (VENTOLIN HFA) 108 (90 Base) MCG/ACT inhaler INHALE 2 PUFFS BY MOUTH EVERY 6 HOURS AS NEEDED (Patient taking differently: Inhale 2 puffs into the lungs every 6 (six) hours as needed for wheezing or shortness of breath.)   apixaban (ELIQUIS) 5 MG TABS tablet Take 1 tablet (5 mg total) by mouth 2 (two) times daily.   aspirin 81 MG EC tablet Take 1 tablet (81 mg total) by mouth daily. Swallow whole.   atorvastatin (LIPITOR) 80 MG tablet Take 1 tablet (80 mg total) by mouth daily.   clopidogrel (PLAVIX) 75 MG tablet Take 1 tablet (75 mg total) by mouth daily.    empagliflozin (JARDIANCE) 10 MG TABS tablet Take 1 tablet (10 mg total) by mouth daily before breakfast.   FEROSUL 325 (65 Fe) MG tablet TAKE 1 TABLET(325 MG) BY MOUTH TWICE DAILY   fluticasone (FLOVENT HFA) 110 MCG/ACT inhaler Inhale 2 puffs into the lungs 2 (two) times daily.   furosemide (LASIX) 20 MG tablet Take 1 tablet (20 mg total) by mouth daily.   HYDROcodone-acetaminophen (NORCO/VICODIN) 5-325 MG tablet Take 1 tablet by mouth 2 (two) times daily as needed (pain).   levothyroxine (SYNTHROID) 50 MCG tablet TAKE 1 TABLET BY MOUTH DAILY   metoprolol tartrate (LOPRESSOR) 25 MG tablet Take 1 tablet (25 mg total) by mouth 2 (two) times daily.   Multiple Vitamin (MULTIVITAMIN WITH MINERALS) TABS tablet Take 1 tablet by mouth daily.   nitroGLYCERIN (NITROSTAT) 0.4 MG SL tablet DISSOLVE 1 TABLET UNDER THE TONGUE EVERY 5 MINUTES AS NEEDED FOR CHEST PAIN. MAX OF 3 DOSES IN 15 MINUTES   pantoprazole (PROTONIX) 40 MG tablet Take 1 tablet (40 mg total) by mouth daily.   ranolazine (RANEXA) 500 MG 12 hr tablet TAKE 1 TABLET(500 MG) BY MOUTH TWICE DAILY (Patient taking differently: Take 500 mg by mouth 2 (two) times daily. TAKE 1 TABLET(500 MG) BY MOUTH TWICE DAILY)   vitamin B-12 (CYANOCOBALAMIN) 1000 MCG tablet Take 1,000 mcg by mouth daily.    No facility-administered encounter medications on file as of 02/10/2021.    Care Gaps: TETANUS/TDAP- Overdue - never done  Zoster Vaccines- Shingrix (1 of 2)- never done PNA vac Low Risk Adult (1 of 2 - PCV13)- never done COVID-19  Vaccine (2 - Janssen risk series)-Last completed: Sep 27, 2019 INFLUENZA VACCINE - Due Annual Wellness Visit  Star Rating Drugs: Atorvastatin '80mg'$ - Last filled 12/25/2020 for 90 DS at [facility] 10 mg- Fill history not found.   Pattricia Boss, [place] Pharmacist Assistant [phone]

## 2021-02-10 NOTE — Progress Notes (Signed)
Heart and Vascular Center Transitions of [facility] Heart Failure Pharmacist Encounter  PCP: Lillard Anes, MD PCP-Cardiologist: Jenne Campus, MD  The patient was approved for a Jardiance grant through the [facility] foundation.  Member ID: [id number] Group ID: [id number] RxBin ID: [id number] PCN: [id number]  Eligibility End Date: 02/09/2022 Assistance Amount: $1,000.00 Phone Number: [phone].   Kerby Nora, PharmD, BCPS Heart Failure Transitions of Care Clinic Pharmacist [phone]

## 2021-02-10 NOTE — Telephone Encounter (Signed)
Called to confirm Heart & Vascular Transitions of Care appointment at 3pm today. Patient reminded to bring all medications and pill box organizer with them. Confirmed patient has transportation. Gave directions, instructed to utilize [facility] parking.  Confirmed appointment prior to ending call.   Pricilla Holm, MSN, RN Heart Failure Nurse Navigator [phone]

## 2021-02-10 NOTE — Telephone Encounter (Signed)
LVM

## 2021-02-10 NOTE — Patient Instructions (Addendum)
No Labs done today.  START Jardiance '10mg'$  (1 tablet) by mouth daily.(Please take the handout provided to you during today's visit to the pharmacy, your copay should be $0. if you have any problems getting this medication please contact our office at [phone] option 2)   DECREASE Lasix to'20mg'$  (1 tablet) by mouth daily.   No other medication changes were made. Please continue all current medications as prescribed.  Your physician recommends that you schedule a follow-up appointment in 3-4 weeks with Cardiology in [place].

## 2021-02-10 NOTE — Progress Notes (Signed)
[facility]  [id number], [place] Primary Cardiologist: Jenne Campus   HPI: [name] is a 86 y.o.  male  with a PMH significant for PAF, chronic diastolic congestive heart failure, CKDIIIb , nonobstructive CAD, peripheral vascular disease, CHB s/p PPM.  Recently diagnosed ischemic CM.     CHB s/p PPM placement 02/2014     Cardiac catheterization 2018 showed 60% right, 50% LAD and 60% PDA.     Echo 08/2020 showed normal LV function, moderate pulmonary hypertension, moderate left atrial enlargement, mild to moderate mitral regurgitation, mild to moderate tricuspid regurgitation, mild to moderate aortic insufficiency, moderate aortic stenosis with mean gradient 21 mmHg   He developed severe chest pain tightness presented to [facility] emergency department for evaluation.  His work-up was consistent with elevated troponin up to 1730 at a peak, BNP elevated to 735, creatinine 1.3, EKG was consistent with V paced rhythm at 92 bpm.  He was started on IV heparin.  He had a repeat echo 01/31/2021 EF 35 to 40%, RWMA, grade 3 diastolic dysfunction, normal RV size and function, moderately elevated PASP estimate.  RVSP estimate 53.1 mmHg, small pericardial effusion without evidence of tamponade, mild to moderate MR, likely moderate aortic stenosis.  He was taken for left/right heart cath which demonstrated multivessel CAD  including 50% distal LMCA extending into the ostial LAD, where there is a heavy calcified 90% stenosis.  Mid LAD has sequential 50-80% stenoses with calcification.  Dominant LCx has sequential 40-50% proximal, mid, and distal lesions.  Moderately elevated left and right heart filling pressures, moderate pulmonary hypertension, moderate aortic stenosis, normal cardiac output/index.     Recommendation for CT surgery consultation for consideration of CABG +/- AVR.  Patient was deemed to not be a good candidate for bypass surgery given his age  and comorbid medical conditions.  He was then evaluated by Dr. Burt Knack for consideration of high risk PCI.  Patient was loaded with clopidogrel and he was taken for high risk PCI.  Given his vascular anatomy it was not feasible to do this with Impella support.  02/04/2021 patient underwent successful orbital atherectomy and stenting of the mid LAD, ostial LAD, and distal left mainstem using 2 overlapping DES with ultrasound guidance.     GDMT limited by significant dye load during admission in the context of CKD3b.    Feeling much better since discharge.  Denies any chest pain.  Says he is breathing better than he has in a long time.  Main complaint is that he is still very weak from hospitalization.  Not checking bp at home.  Weight continues to go down.  Having dizziness and light headedness after taking his medicines in the morning or when getting up out of his chair.    ROS: All systems negative except as listed in HPI, PMH and Problem List.  SH:  Social History   Socioeconomic History   Marital status: Married    Spouse name: [name]   Number of children: 2   Years of education: Not on file   Highest education level: 6th grade  Occupational History   Occupation: Retired  Tobacco Use   Smoking status: Never   Smokeless tobacco: Current    Types: Snuff   Tobacco comments:    1 can a month  Vaping Use   Vaping Use: Never used  Substance and Sexual Activity   Alcohol use: Yes    Alcohol/week: 3.0 standard drinks  Types: 3 Cans of beer per week    Comment: regular use 4 times a week   Drug use: No   Sexual activity: Not Currently    Partners: Female    Birth control/protection: None  Other Topics Concern   Not on file  Social History Narrative   Not on file   Social Determinants of Health   Financial Resource Strain: Low Risk    Difficulty of Paying Living Expenses: Not hard at all  Food Insecurity: No Food Insecurity   Worried About Charity fundraiser in the Last  Year: Never true   [place] in the Last Year: Never true  Transportation Needs: No Transportation Needs   Lack of Transportation (Medical): No   Lack of Transportation (Non-Medical): No  Physical Activity: Sufficiently Active   Days of Exercise per Week: 4 days   Minutes of Exercise per Session: 60 min  Stress: No Stress Concern Present   Feeling of Stress : Only a little  Social Connections: Moderately Isolated   Frequency of Communication with Friends and Family: Never   Frequency of Social Gatherings with Friends and Family: Never   Attends Religious Services: 1 to 4 times per year   Active Member of [employer] or Organizations: No   Attends Music therapist: Never   Marital Status: Married  Human resources officer Violence: Not At Risk   Fear of Current or Ex-Partner: No   Emotionally Abused: No   Physically Abused: No   Sexually Abused: No    FH:  Family History  Problem Relation Age of Onset   Heart attack Mother     Past Medical History:  Diagnosis Date   Acute diastolic CHF (congestive heart failure) ([place]) 04/12/2019   Acute on chronic combined systolic and diastolic CHF (congestive heart failure) ([place]) 04/09/2019   Arthritis    Asthma    BMI 32.0-32.9,adult 04/17/2020   Chronic anemia    CKD (chronic kidney disease), stage III ([place])    Complete atrioventricular block ([place]) 04/01/2015   Complete heart block ([place]) 04/01/2015   Coronary artery disease    a. moderate nonobstructive CAD by cath in 2018.   Coronary atherosclerosis of native coronary artery 04/09/2019   Mid RCA lesion, 60 %stenosed. Prox LAD lesion, 40 %stenosed. Prox LAD to Mid LAD lesion, 50 %stenosed. LPDA lesion, 60 %stenosed.   Disorder of carotid artery ([place]) 12/29/2016   Dyslipidemia 12/29/2016   Essential hypertension, benign 08/15/2017   Exertional angina ([place]) 02/08/2017   Gastritis and gastroduodenitis    GERD (gastroesophageal reflux disease)    Hypothyroidism    Iron deficiency  anemia    Late effect of cerebrovascular accident (CVA) 05/25/2017   Late effects of cerebrovascular disease 05/25/2017   Malignant neoplasm of prostate ([place]) 08/17/2019   Mixed hyperlipidemia 12/14/2019   Obesity 01/25/2018   Osteoarthritis of both knees 08/17/2019   Pacemaker 08/08/2019   PAF (paroxysmal atrial fibrillation) (HCC)    Paroxysmal atrial fibrillation ([place]) 04/01/2015   Peripheral vascular disease ([place])    Polyp of cecum    Precordial pain 04/23/2015   Preinfarction syndrome ([place]) 02/08/2017   Right-sided carotid artery disease ([place]) 12/29/2016   Routine general medical examination at a health care facility 04/01/2015   [place] of cerebrovascular accident 05/25/2017   Symptomatic anemia 04/12/2019    Current Outpatient Medications  Medication Sig Dispense Refill   albuterol (VENTOLIN HFA) 108 (90 Base) MCG/ACT inhaler INHALE 2 PUFFS BY MOUTH EVERY 6 HOURS AS  NEEDED (Patient taking differently: Inhale 2 puffs into the lungs every 6 (six) hours as needed for wheezing or shortness of breath.) 8.5 g 6   apixaban (ELIQUIS) 5 MG TABS tablet Take 1 tablet (5 mg total) by mouth 2 (two) times daily. 60 tablet 6   aspirin 81 MG EC tablet Take 1 tablet (81 mg total) by mouth daily. Swallow whole. 30 tablet 11   atorvastatin (LIPITOR) 80 MG tablet Take 1 tablet (80 mg total) by mouth daily. 90 tablet 3   clopidogrel (PLAVIX) 75 MG tablet Take 1 tablet (75 mg total) by mouth daily. 90 tablet 3   FEROSUL 325 (65 Fe) MG tablet TAKE 1 TABLET(325 MG) BY MOUTH TWICE DAILY 100 tablet 2   fluticasone (FLOVENT HFA) 110 MCG/ACT inhaler Inhale 2 puffs into the lungs 2 (two) times daily. 12 g 6   furosemide (LASIX) 40 MG tablet Take 1 tablet (40 mg total) by mouth daily. 30 tablet 11   HYDROcodone-acetaminophen (NORCO/VICODIN) 5-325 MG tablet Take 1 tablet by mouth 2 (two) times daily as needed (pain). 60 tablet 0   levothyroxine (SYNTHROID) 50 MCG tablet TAKE 1 TABLET BY MOUTH DAILY 90 tablet 2    metoprolol tartrate (LOPRESSOR) 25 MG tablet Take 1 tablet (25 mg total) by mouth 2 (two) times daily. 180 tablet 3   Multiple Vitamin (MULTIVITAMIN WITH MINERALS) TABS tablet Take 1 tablet by mouth daily.     nitroGLYCERIN (NITROSTAT) 0.4 MG SL tablet DISSOLVE 1 TABLET UNDER THE TONGUE EVERY 5 MINUTES AS NEEDED FOR CHEST PAIN. MAX OF 3 DOSES IN 15 MINUTES 25 tablet 3   pantoprazole (PROTONIX) 40 MG tablet Take 1 tablet (40 mg total) by mouth daily. 90 tablet 3   ranolazine (RANEXA) 500 MG 12 hr tablet TAKE 1 TABLET(500 MG) BY MOUTH TWICE DAILY (Patient taking differently: Take 500 mg by mouth 2 (two) times daily. TAKE 1 TABLET(500 MG) BY MOUTH TWICE DAILY) 180 tablet 2   vitamin B-12 (CYANOCOBALAMIN) 1000 MCG tablet Take 1,000 mcg by mouth daily.      No current facility-administered medications for this encounter.    Vitals:   02/10/21 1431  BP: 130/70  Pulse: 70  SpO2: 98%  Weight: 79.2 kg (174 lb 9.6 oz)    PHYSICAL EXAM: Cardiac: JVD flat, normal rate and rhythm, clear s1 and s2, no murmurs, rubs or gallops, no LE edema Pulmonary: CTAB, not in distress Abdominal: non distended abdomen, soft and nontender Psych: Alert, conversant, in good spirits  ASSESSMENT & PLAN:  Combined Systolic and Diastolic CHF Ischemic CM -EF normal 08/2020, Echo 01/31/2021 EF 35 to 40%, RWMA, grade 3 diastolic dysfunction, normal RV size and function, moderately elevated PASP estimate.  RVSP estimate 53.1 mmHg, small pericardial effusion without evidence of tamponade, mild to moderate MR, likely moderate aortic stenosis -01/2021 NSTEMI, multivessel disease by LHC not CABG candidate underwent high risk PCI orbital atherectomy and stenting of the mid LAD, ostial LAD, and distal left mainstem using 2 overlapping DES -NYHA Class III but could be a component of weakness and deconditioning, volume status okay -with dizziness will not escalate GDMT too aggressively.  Will start by adding jardiance and decreasing  lasix to '20mg'$  daily.   -on metoprolol tartrate can switch to succinate next visit -discussed cardiac rehab due to his deconditioning, he had a bad experience with physical therapy in the past and would not like to proceed with this at this time.     CAD -CAD hx outlined above -  no s/s of bleeding and no further chest pain -continue triple therapy then drop asa after one month and continue eliquis/plavix   CKD3a -repeat bmp today   CHB s/p PPM -functioning appropriately could not interrogate today based on ecg A sensed V paced   PAF -regular on exam and NSR on most recent ecg -continue eliquis   Follow up w/Dr. Agustin Cree

## 2021-02-11 ENCOUNTER — Encounter (HOSPITAL_COMMUNITY): Payer: Self-pay

## 2021-02-12 ENCOUNTER — Telehealth (HOSPITAL_COMMUNITY): Payer: Self-pay

## 2021-02-12 ENCOUNTER — Telehealth: Payer: Self-pay

## 2021-02-12 NOTE — Chronic Care Management (AMB) (Signed)
    Chronic Care Management Pharmacy Assistant   Name: [name]  MRN: [id number] DOB: May 31, 1935  Reason for Encounter: Patient Assistance Coordination  02/12/2021- Patient assistance application filled out for Jardiance 10 mg with [employer] patient assistance program.   02/16/2021-  Patient has appointment with Dr. Henrene Pastor on 02/16/2021, called wife to inform about application and income documentation needed. Per Wife patient was able to get a grant for [employer]. Patient assistance no longer needed. Donette Larry, Pharm D. Notified.    Pattricia Boss, [place] Pharmacist Assistant [phone]

## 2021-02-12 NOTE — Telephone Encounter (Signed)
Per cardiac rehab phase I, fax cardiac rehab referral to [facility] cardiac rehab.

## 2021-02-16 ENCOUNTER — Ambulatory Visit (INDEPENDENT_AMBULATORY_CARE_PROVIDER_SITE_OTHER): Payer: PPO

## 2021-02-16 DIAGNOSIS — I442 Atrioventricular block, complete: Secondary | ICD-10-CM | POA: Diagnosis not present

## 2021-02-17 ENCOUNTER — Other Ambulatory Visit: Payer: Self-pay

## 2021-02-17 ENCOUNTER — Encounter: Payer: Self-pay | Admitting: Legal Medicine

## 2021-02-17 ENCOUNTER — Ambulatory Visit (INDEPENDENT_AMBULATORY_CARE_PROVIDER_SITE_OTHER): Payer: PPO | Admitting: Legal Medicine

## 2021-02-17 VITALS — BP 100/50 | HR 96 | Temp 96.1°F | Resp 18 | Ht 62.0 in | Wt 175.0 lb

## 2021-02-17 DIAGNOSIS — I350 Nonrheumatic aortic (valve) stenosis: Secondary | ICD-10-CM

## 2021-02-17 DIAGNOSIS — E782 Mixed hyperlipidemia: Secondary | ICD-10-CM

## 2021-02-17 DIAGNOSIS — N1831 Chronic kidney disease, stage 3a: Secondary | ICD-10-CM

## 2021-02-17 DIAGNOSIS — C61 Malignant neoplasm of prostate: Secondary | ICD-10-CM | POA: Diagnosis not present

## 2021-02-17 DIAGNOSIS — Z6832 Body mass index (BMI) 32.0-32.9, adult: Secondary | ICD-10-CM

## 2021-02-17 DIAGNOSIS — I255 Ischemic cardiomyopathy: Secondary | ICD-10-CM | POA: Diagnosis not present

## 2021-02-17 DIAGNOSIS — D6869 Other thrombophilia: Secondary | ICD-10-CM

## 2021-02-17 DIAGNOSIS — I5023 Acute on chronic systolic (congestive) heart failure: Secondary | ICD-10-CM

## 2021-02-17 DIAGNOSIS — E039 Hypothyroidism, unspecified: Secondary | ICD-10-CM

## 2021-02-17 DIAGNOSIS — I10 Essential (primary) hypertension: Secondary | ICD-10-CM | POA: Diagnosis not present

## 2021-02-17 DIAGNOSIS — I6930 Unspecified sequelae of cerebral infarction: Secondary | ICD-10-CM | POA: Diagnosis not present

## 2021-02-17 DIAGNOSIS — Z950 Presence of cardiac pacemaker: Secondary | ICD-10-CM

## 2021-02-17 LAB — CUP PACEART REMOTE DEVICE CHECK
Battery Impedance: 1493 Ohm
Battery Remaining Longevity: 29 mo
Battery Voltage: 2.75 V
Brady Statistic AP VP Percent: 17 %
Brady Statistic AP VS Percent: 0 %
Brady Statistic AS VP Percent: 83 %
Brady Statistic AS VS Percent: 0 %
Date Time Interrogation Session: 20220829115708
Implantable Lead Implant Date: 20150319
Implantable Lead Implant Date: 20150319
Implantable Lead Location: 753859
Implantable Lead Location: 753860
Implantable Lead Model: 4092
Implantable Lead Model: 5076
Implantable Pulse Generator Implant Date: 20150319
Lead Channel Impedance Value: 431 Ohm
Lead Channel Impedance Value: 624 Ohm
Lead Channel Pacing Threshold Amplitude: 0.875 V
Lead Channel Pacing Threshold Amplitude: 1.25 V
Lead Channel Pacing Threshold Pulse Width: 0.4 ms
Lead Channel Pacing Threshold Pulse Width: 0.4 ms
Lead Channel Setting Pacing Amplitude: 1.75 V
Lead Channel Setting Pacing Amplitude: 4.5 V
Lead Channel Setting Pacing Pulse Width: 0.46 ms
Lead Channel Setting Sensing Sensitivity: 2 mV

## 2021-02-17 LAB — POCT URINALYSIS DIP (CLINITEK)
Bilirubin, UA: NEGATIVE
Blood, UA: NEGATIVE
Glucose, UA: 500 mg/dL — AB
Ketones, POC UA: NEGATIVE mg/dL
Leukocytes, UA: NEGATIVE
Nitrite, UA: NEGATIVE
Spec Grav, UA: 1.015 (ref 1.010–1.025)
Urobilinogen, UA: 0.2 E.U./dL
pH, UA: 5.5 (ref 5.0–8.0)

## 2021-02-17 MED ORDER — TAMSULOSIN HCL 0.4 MG PO CAPS: 0.4000 mg | ORAL_CAPSULE | Freq: Every day | ORAL | 3 refills | Status: AC

## 2021-02-17 NOTE — Patient Instructions (Signed)
Cut down metoprolol to once a day

## 2021-02-17 NOTE — Progress Notes (Addendum)
Established Patient Office Visit  Subjective:  Patient ID: [name], male    DOB: 02-Nov-1934  Age: 86 y.o. MRN: [id number]  CC:  Chief Complaint  Patient presents with   Transitions Of Care    Patient was admitted at [facility] on 01/30/2021 to 02/05/2021.     HPI [name] presents for transition of care and reconciliation of medicines.  He wa admitted to [facility] 01/30/2021 with angina.  He has catheterization 2 stents placed. EF % 35-40%. Grade 3 diastolic. He was discharge 02/05/2021.  No chest pain at home but he is weak with low EF .  We discussed.  He gets DOE and once went to paramedics to get O2.  From hospital visit Cardiac cath 02/04/21     Dist LM lesion is 50% stenosed.   Ost LAD lesion is 90% stenosed.   Mid LAD-1 lesion is 50% stenosed.   Mid LAD-2 lesion is 80% stenosed.   Mid Cx lesion is 50% stenosed.   Ost Cx to Prox Cx lesion is 50% stenosed.   Dist RCA lesion is 50% stenosed.   LPAV lesion is 40% stenosed.   A drug-eluting stent was successfully placed using a STENT ONYX FRONTIER 2.5X34.   A drug-eluting stent was successfully placed using a STENT ONYX FRONTIER 3.5X18.   Post intervention, there is a 0% residual stenosis.   Post intervention, there is a 0% residual stenosis.   Post intervention, there is a 0% residual stenosis.   Post intervention, there is a 0% residual stenosis.   Successful orbital atherectomy and stenting of the mid LAD, ostial LAD, and distal left mainstem using 2 resolute Onyx overlapping drug-eluting stents with intravascular ultrasound guidance  Patient presents for follow up of hypertension.  Patient tolerating metoprolol  well with side effects.  Patient was diagnosed with hypertension 2010 so has been treated for hypertension for 10 years.Patient is working on maintaining diet and exercise regimen and follows up as directed. Complication include CAD  Patient presents with hyperlipidemia.  Compliance with  treatment has been good; patient takes medicines as directed, maintains low cholesterol diet, follows up as directed, and maintains exercise regimen.  Patient is using atorvastatin without problems.   Patient presents with HDrEF  that is stable. Diagnosis made 2020.  The course of the disease is stable.  Current medicines include metoprolol, Nitroglycerine, Ranexa. Patient follows a low cholesterol diet and maintains a weight diary.  Patient is on low salt, low cholesterol diet and avoids alcohol.  Patient denies adverse effects of medicines. Patient is monitoring weight and has lost 3 lbs of weight.  Patient is having no pedal edema, some PND and some PND.  Patient is continuing to see cardiology.  . Patients O2 sat at rest was 98% on RA, he was abe t walk 3 minutes but stopped due to fatigue, O2 was still 98%.  He still may need O2 at home .  I cannot document falling O2 sat at present.   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 moderate  nonobstructive CAD by cath in 2018.   Coronary atherosclerosis of native coronary artery 04/09/2019   Mid RCA lesion, 60 %stenosed. Prox LAD lesion, 40 %stenosed. Prox LAD to Mid LAD lesion, 50 %stenosed. LPDA lesion, 60 %stenosed.   Disorder of carotid artery ([name]) 12/29/2016   Dyslipidemia 12/29/2016   Essential hypertension, benign 08/15/2017   Exertional angina ([name]) 02/08/2017   Gastritis and gastroduodenitis    GERD (gastroesophageal reflux disease)    Hypothyroidism    Iron deficiency anemia    Late effect of cerebrovascular accident (CVA) 05/25/2017    Late effects of cerebrovascular disease 05/25/2017   Malignant neoplasm of prostate ([name]) 08/17/2019   Mixed hyperlipidemia 12/14/2019   Obesity 01/25/2018   Osteoarthritis of both knees 08/17/2019   Pacemaker 08/08/2019   PAF (paroxysmal atrial fibrillation) (HCC)    Paroxysmal atrial fibrillation ([place]) 04/01/2015   Peripheral vascular disease ([name])    Polyp of cecum    Precordial pain 04/23/2015   Preinfarction syndrome ([place]) 02/08/2017   Right-sided carotid artery disease ([name]) 12/29/2016   Routine general medical examination at a health care facility 04/01/2015   Sequela of cerebrovascular accident 05/25/2017   Symptomatic anemia 04/12/2019    Past Surgical History:  Procedure Laterality Date   BIOPSY  04/11/2019   Procedure: BIOPSY;  Surgeon: Milus Banister, MD;  Location: [place];  Service: Endoscopy;;   CATARACT EXTRACTION     COLONOSCOPY WITH PROPOFOL N/A 04/11/2019   Procedure: COLONOSCOPY WITH PROPOFOL;  Surgeon: Milus Banister, MD;  Location: [facility] ENDOSCOPY;  Service: Endoscopy;  Laterality: N/A;   CORONARY ATHERECTOMY N/A 02/04/2021   Procedure: CORONARY ATHERECTOMY;  Surgeon: Sherren Mocha, MD;  Location: [place] CV LAB;  Service: Cardiovascular;  Laterality: N/A;   CORONARY STENT INTERVENTION N/A 02/04/2021   Procedure: CORONARY STENT INTERVENTION;  Surgeon: Sherren Mocha, MD;  Location: [place] [facility] LAB;  Service: Cardiovascular;  Laterality: N/A;   ESOPHAGOGASTRODUODENOSCOPY (EGD) WITH PROPOFOL N/A 04/11/2019   Procedure: ESOPHAGOGASTRODUODENOSCOPY (EGD) WITH PROPOFOL;  Surgeon: Milus Banister, MD;  Location: [facility] ENDOSCOPY;  Service: Endoscopy;  Laterality: N/A;   HIP SURGERY     INSERT / REPLACE / REMOVE PACEMAKER     [employer]   INTRAVASCULAR ULTRASOUND/IVUS N/A 02/04/2021   Procedure: Intravascular Ultrasound/IVUS;  Surgeon: Sherren Mocha, MD;  Location: [place] [facility];  Service: Cardiovascular;  Laterality: N/A;   JOINT REPLACEMENT Right 10/20/1994    Hip  (Left Hip in 10/96)   LEFT HEART CATH AND CORONARY ANGIOGRAPHY N/A 02/08/2017   Procedure: LEFT HEART CATH AND CORONARY ANGIOGRAPHY;  Surgeon: Martinique, Peter M, MD;  Location: [facility] LAB;  Service: Cardiovascular;  Laterality: N/A;   ORIF PERIPROSTHETIC FRACTURE Right 08/17/2017   Procedure: OPEN REDUCTION INTERNAL FIXATION (ORIF) PERIPROSTHETIC FRACTURE RIGHT FEMUR;  Surgeon: Paralee Cancel, MD;  Location: [facility];  Service: Orthopedics;  Laterality: Right;   ORIF PERIPROSTHETIC FRACTURE Right 01/23/2018   Procedure: Redo open reduction internal fixation right periprosthetic femur fracture;  Surgeon: Paralee Cancel, MD;  Location: [facility];  Service: Orthopedics;  Laterality: Right;  120 mins   POLYPECTOMY  04/11/2019   Procedure: POLYPECTOMY;  Surgeon: Milus Banister, MD;  Location: [facility] ENDOSCOPY;  Service: Endoscopy;;   RIGHT/LEFT HEART CATH AND CORONARY ANGIOGRAPHY N/A 02/02/2021   Procedure: RIGHT/LEFT HEART CATH AND CORONARY ANGIOGRAPHY;  Surgeon: Nelva Bush, MD;  Location: [place] [facility] LAB;  Service: Cardiovascular;  Laterality: N/A;    Family History  Problem Relation Age of Onset  Heart attack Mother     Social History   Socioeconomic History   Marital status: Married    Spouse name: Trung Wenzl   Number of children: 2   Years of education: Not on file   Highest education level: 6th grade  Occupational History   Occupation: Retired  Tobacco Use   Smoking status: Never   Smokeless tobacco: Current    Types: Snuff   Tobacco comments:    1 can a month  Vaping Use   Vaping Use: Never used  Substance and Sexual Activity   Alcohol use: Yes    Alcohol/week: 3.0 standard drinks    Types: 3 Cans of beer per week    Comment: regular use 4 times a week   Drug use: No   Sexual activity: Not Currently    Partners: Female    Birth control/protection: None  Other Topics Concern   Not on file  Social History Narrative   Not on file   Social Determinants of  Health   Financial Resource Strain: Low Risk    Difficulty of Paying Living Expenses: Not hard at all  Food Insecurity: No Food Insecurity   Worried About Charity fundraiser in the Last Year: Never true   Ran Out of Food in the Last Year: Never true  Transportation Needs: No Transportation Needs   Lack of Transportation (Medical): No   Lack of Transportation (Non-Medical): No  Physical Activity: Sufficiently Active   Days of Exercise per Week: 4 days   Minutes of Exercise per Session: 60 min  Stress: No Stress Concern Present   Feeling of Stress : Only a little  Social Connections: Moderately Isolated   Frequency of Communication with Friends and Family: Never   Frequency of Social Gatherings with Friends and Family: Never   Attends Religious Services: 1 to 4 times per year   Active Member of [employer] or Organizations: No   Attends Music therapist: Never   Marital Status: Married  Human resources officer Violence: Not At Risk   Fear of Current or Ex-Partner: No   Emotionally Abused: No   Physically Abused: No   Sexually Abused: No    Outpatient Medications Prior to Visit  Medication Sig Dispense Refill   albuterol (VENTOLIN HFA) 108 (90 Base) MCG/ACT inhaler INHALE 2 PUFFS BY MOUTH EVERY 6 HOURS AS NEEDED (Patient taking differently: Inhale 2 puffs into the lungs every 6 (six) hours as needed for wheezing or shortness of breath.) 8.5 g 6   apixaban (ELIQUIS) 5 MG TABS tablet Take 1 tablet (5 mg total) by mouth 2 (two) times daily. 60 tablet 6   aspirin 81 MG EC tablet Take 1 tablet (81 mg total) by mouth daily. Swallow whole. 30 tablet 11   atorvastatin (LIPITOR) 80 MG tablet Take 1 tablet (80 mg total) by mouth daily. 90 tablet 3   clopidogrel (PLAVIX) 75 MG tablet Take 1 tablet (75 mg total) by mouth daily. 90 tablet 3   empagliflozin (JARDIANCE) 10 MG TABS tablet Take 1 tablet (10 mg total) by mouth daily before breakfast. 30 tablet 11   FEROSUL 325 (65 Fe) MG tablet TAKE  1 TABLET(325 MG) BY MOUTH TWICE DAILY 100 tablet 2   fluticasone (FLOVENT HFA) 110 MCG/ACT inhaler Inhale 2 puffs into the lungs 2 (two) times daily. 12 g 6   furosemide (LASIX) 20 MG tablet Take 1 tablet (20 mg total) by mouth daily. 30 tablet 11   HYDROcodone-acetaminophen (NORCO/VICODIN) 5-325 MG  tablet Take 1 tablet by mouth 2 (two) times daily as needed for moderate pain.     levothyroxine (SYNTHROID) 50 MCG tablet TAKE 1 TABLET BY MOUTH DAILY 90 tablet 2   metoprolol tartrate (LOPRESSOR) 25 MG tablet Take 1 tablet (25 mg total) by mouth 2 (two) times daily. 180 tablet 3   Multiple Vitamin (MULTIVITAMIN WITH MINERALS) TABS tablet Take 1 tablet by mouth daily.     nitroGLYCERIN (NITROSTAT) 0.4 MG SL tablet DISSOLVE 1 TABLET UNDER THE TONGUE EVERY 5 MINUTES AS NEEDED FOR CHEST PAIN. MAX OF 3 DOSES IN 15 MINUTES 25 tablet 3   pantoprazole (PROTONIX) 40 MG tablet Take 1 tablet (40 mg total) by mouth daily. 90 tablet 3   ranolazine (RANEXA) 500 MG 12 hr tablet TAKE 1 TABLET(500 MG) BY MOUTH TWICE DAILY (Patient taking differently: Take 500 mg by mouth 2 (two) times daily. TAKE 1 TABLET(500 MG) BY MOUTH TWICE DAILY) 180 tablet 2   vitamin B-12 (CYANOCOBALAMIN) 1000 MCG tablet Take 1,000 mcg by mouth daily.      HYDROcodone-acetaminophen (NORCO/VICODIN) 5-325 MG tablet Take 1 tablet by mouth 2 (two) times daily as needed (pain). 60 tablet 0   No facility-administered medications prior to visit.    No Known Allergies  ROS Review of Systems  Constitutional:  Positive for fatigue. Negative for chills and fever.  HENT:  Negative for congestion, ear pain and sore throat.   Respiratory:  Positive for shortness of breath. Negative for cough.   Cardiovascular:  Negative for chest pain, palpitations and leg swelling.  Gastrointestinal:  Negative for abdominal pain, constipation, diarrhea, nausea and vomiting.  Endocrine: Negative for polydipsia, polyphagia and polyuria.  Genitourinary:  Negative for  dysuria and frequency.  Musculoskeletal:  Negative for arthralgias and myalgias.  Neurological:  Negative for dizziness and headaches.  Psychiatric/Behavioral:  Negative for dysphoric mood.        No dysphoria     Objective:    Physical Exam Vitals reviewed.  Constitutional:      Appearance: He is ill-appearing.  HENT:     Head: Normocephalic.     Right Ear: Tympanic membrane normal.     Left Ear: Tympanic membrane normal.     Mouth/Throat:     Mouth: Mucous membranes are moist.     Pharynx: Oropharynx is clear.  Eyes:     Extraocular Movements: Extraocular movements intact.     Conjunctiva/sclera: Conjunctivae normal.     Pupils: Pupils are equal, round, and reactive to light.  Cardiovascular:     Rate and Rhythm: Normal rate and regular rhythm.     Pulses: Normal pulses.     Heart sounds: Murmur (aortic stenosis) heard.  Pulmonary:     Effort: Pulmonary effort is normal.     Breath sounds: Normal breath sounds.  Abdominal:     General: Abdomen is flat. Bowel sounds are normal. There is no distension.     Palpations: Abdomen is soft.     Tenderness: There is no abdominal tenderness.  Musculoskeletal:     Right lower leg: No edema.     Left lower leg: No edema.     Comments: Muscle wasting  Skin:    General: Skin is warm.     Capillary Refill: Capillary refill takes less than 2 seconds.  Neurological:     General: No focal deficit present.     Mental Status: He is alert and oriented to person, place, and time.     Motor: Weakness  present.  Psychiatric:        Mood and Affect: Mood normal.    BP (!) 100/50   Pulse 96   Temp (!) 96.1 F (35.6 C)   Resp 18   Ht _0  (1.575 m)   Wt 175 lb (79.4 kg)   SpO2 97% Comment: after walking for 150 feet approximately  BMI 32.01 kg/m  Wt Readings from Last 3 Encounters:  02/17/21 175 lb (79.4 kg)  02/10/21 174 lb 9.6 oz (79.2 kg)  02/03/21 178 lb 9.2 oz (81 kg)     Health Maintenance Due  Topic Date Due    TETANUS/TDAP  Never done   Zoster Vaccines- Shingrix (1 of 2) Never done   PNA vac Low Risk Adult (1 of 2 - PCV13) Never done   COVID-19 Vaccine (2 - Janssen risk series) 10/25/2019   INFLUENZA VACCINE  01/19/2021    There are no preventive care reminders to display for this patient.  Lab Results  Component Value Date   TSH 3.000 08/15/2020   Lab Results  Component Value Date   WBC 6.6 02/05/2021   HGB 11.5 (L) 02/05/2021   HCT 33.6 (L) 02/05/2021   MCV 90.6 02/05/2021   PLT 238 02/05/2021   Lab Results  Component Value Date   NA 133 (L) 02/09/2021   K 4.1 02/09/2021   CO2 25 02/09/2021   GLUCOSE 130 (H) 02/09/2021   BUN 24 02/09/2021   CREATININE 1.54 (H) 02/09/2021   BILITOT 0.8 01/30/2021   ALKPHOS 62 01/30/2021   AST 30 01/30/2021   ALT 17 01/30/2021   PROT 6.2 (L) 01/30/2021   ALBUMIN 3.2 (L) 01/30/2021   CALCIUM 9.2 02/09/2021   ANIONGAP 11 02/05/2021   EGFR 44 (L) 02/09/2021   Lab Results  Component Value Date   CHOL 164 01/30/2021   Lab Results  Component Value Date   HDL 55 01/30/2021   Lab Results  Component Value Date   LDLCALC 92 01/30/2021   Lab Results  Component Value Date   TRIG 83 01/30/2021   Lab Results  Component Value Date   CHOLHDL 3.0 01/30/2021   Lab Results  Component Value Date   HGBA1C  07/07/2008    5.8 (NOTE)   The ADA recommends the following therapeutic goal for glycemic   control related to Hgb A1C measurement:   Goal of Therapy:   < 7.0% Hgb A1C   Reference: American Diabetes Association: Clinical Practice   Recommendations 2008, Diabetes Care,  2008, 31:(Suppl 1).      Assessment & Plan:   Diagnoses and all orders for this visit: Essential hypertension, benign -     CBC with Differential/Platelet -     Comprehensive metabolic panel Bp is low 397/67, try decreasing metoprolol to once a day to allow increase BP which may help fatigue  Ischemic cardiomyopathy Patient has chronic cardiomegaly with low EF and  diastolic relaxation  Moderate aortic stenosis Patient has moderate AS  Acute on chronic HFrEF (heart failure with reduced ejection fraction) (HCC) -     Pro b natriuretic peptide Patient has chronic congestive heart failure with reduced ejection fraction ejection fraction is between 30 and 35% he also has grade 3 diastolic failure I tried to explain to the patient that this is the main reason for his fatigue and I am not certain he understands that discussed it also with his wife.  Hopefully his ejection fraction may increase after he has had the stents long  enough  Pacemaker Patient has a chronic pacemaker related to third-degree AV block and it is capturing and doing well.  Mixed hyperlipidemia -     Lipid panel AN INDIVIDUAL CARE PLAN for hyperlipidemia/ cholesterol was established and reinforced today.  The patient's status was assessed using clinical findings on exam, lab and other diagnostic tests. The patient's disease status was assessed based on evidence-based guidelines and found to be fair controlled. MEDICATIONS were reviewed. SELF MANAGEMENT GOALS have been discussed and patient's success at attaining the goal of low cholesterol was assessed. RECOMMENDATION given include regular exercise 3 days a week and low cholesterol/low fat diet. CLINICAL SUMMARY including written plan to identify barriers unique to the patient due to social or economic  reasons was discussed.   BMI 32.0-32.9,adult An individualize plan was formulated for obesity using patient history and physical exam to encourage weight loss.  An evidence based program was formulated.  Patient is to watch portion size with meals and to plan physical exercise 3 days a week at least 20 minutes.  Weight watchers and other programs are helpful.  Planned amount of weight loss - none  Hypothyroidism, unspecified type -     TSH Patient is known to have hypothyroidism and is n treatment with levothyroxine 91mg.  Patient was  diagnosed 10 years ago.  Other treatment includes none.  Patient is compliant with medicines and last TSH 6 months ago.  Last TSH was normal.   Malignant neoplasm of prostate (HCC) -     tamsulosin (FLOMAX) 0.4 MG CAPS capsule; Take 1 capsule (0.4 mg total) by mouth daily. -     POCT URINALYSIS DIP (CLINITEK) Patient has a history of malignant neoplasm of the prostate but I am unable to get his old records at the present time his urologist has retired.  I tried to discussed this with the family but they did not seem to know much about it.  Stage 3a chronic kidney disease (HDover Patient has chronic renal failure which I believe is worse since he has had his cardiac intervention we will need to see what level is at the present time.  Sequela of cerebrovascular accident  Patient had a stroke in the past which made him weak in the lower extremity he does not seem to have a particular side that is weak though.  Acquired thrombphila patient is on eliquis   Follow-up: Return in about 2 weeks (around 03/03/2021) for chf.    LReinaldo Meeker MD

## 2021-02-18 DIAGNOSIS — D6869 Other thrombophilia: Secondary | ICD-10-CM | POA: Insufficient documentation

## 2021-02-18 LAB — CBC WITH DIFFERENTIAL/PLATELET
Basophils Absolute: 0.1 10*3/uL (ref 0.0–0.2)
Basos: 1 %
EOS (ABSOLUTE): 0.3 10*3/uL (ref 0.0–0.4)
Eos: 4 %
Hematocrit: 36.1 % — ABNORMAL LOW (ref 37.5–51.0)
Hemoglobin: 11.8 g/dL — ABNORMAL LOW (ref 13.0–17.7)
Immature Grans (Abs): 0 10*3/uL (ref 0.0–0.1)
Immature Granulocytes: 1 %
Lymphocytes Absolute: 1.5 10*3/uL (ref 0.7–3.1)
Lymphs: 18 %
MCH: 29.4 pg (ref 26.6–33.0)
MCHC: 32.7 g/dL (ref 31.5–35.7)
MCV: 90 fL (ref 79–97)
Monocytes Absolute: 0.8 10*3/uL (ref 0.1–0.9)
Monocytes: 10 %
Neutrophils Absolute: 5.4 10*3/uL (ref 1.4–7.0)
Neutrophils: 66 %
Platelets: 427 10*3/uL (ref 150–450)
RBC: 4.01 x10E6/uL — ABNORMAL LOW (ref 4.14–5.80)
RDW: 11.9 % (ref 11.6–15.4)
WBC: 8.2 10*3/uL (ref 3.4–10.8)

## 2021-02-18 LAB — COMPREHENSIVE METABOLIC PANEL
ALT: 15 IU/L (ref 0–44)
AST: 18 IU/L (ref 0–40)
Albumin/Globulin Ratio: 1.6 (ref 1.2–2.2)
Albumin: 4.3 g/dL (ref 3.6–4.6)
Alkaline Phosphatase: 79 IU/L (ref 44–121)
BUN/Creatinine Ratio: 18 (ref 10–24)
BUN: 29 mg/dL — ABNORMAL HIGH (ref 8–27)
Bilirubin Total: 0.4 mg/dL (ref 0.0–1.2)
CO2: 23 mmol/L (ref 20–29)
Calcium: 9.8 mg/dL (ref 8.6–10.2)
Chloride: 93 mmol/L — ABNORMAL LOW (ref 96–106)
Creatinine, Ser: 1.63 mg/dL — ABNORMAL HIGH (ref 0.76–1.27)
Globulin, Total: 2.7 g/dL (ref 1.5–4.5)
Glucose: 118 mg/dL — ABNORMAL HIGH (ref 65–99)
Potassium: 4.8 mmol/L (ref 3.5–5.2)
Sodium: 133 mmol/L — ABNORMAL LOW (ref 134–144)
Total Protein: 7 g/dL (ref 6.0–8.5)
eGFR: 42 mL/min/{1.73_m2} — ABNORMAL LOW (ref >59)

## 2021-02-18 LAB — LIPID PANEL
Chol/HDL Ratio: 3.9 ratio (ref 0.0–5.0)
Cholesterol, Total: 168 mg/dL (ref 100–199)
HDL: 43 mg/dL (ref >39)
LDL Chol Calc (NIH): 92 mg/dL (ref 0–99)
Triglycerides: 195 mg/dL — ABNORMAL HIGH (ref 0–149)
VLDL Cholesterol Cal: 33 mg/dL (ref 5–40)

## 2021-02-18 LAB — TSH: TSH: 4.73 u[IU]/mL — ABNORMAL HIGH (ref 0.450–4.500)

## 2021-02-18 LAB — CARDIOVASCULAR RISK ASSESSMENT

## 2021-02-18 LAB — PRO B NATRIURETIC PEPTIDE: NT-Pro BNP: 4270 pg/mL — ABNORMAL HIGH (ref 0–486)

## 2021-02-18 NOTE — Progress Notes (Signed)
Anemia stable, glucose 118, creatinine rising 1.63, sodium is low, liberalize table salt for 2 weeks, , triglycerides high 195, Pro BNP 4, 270 increase furosemide to 40 mg -2 pills for one week- follow daily weights.  He is in more heart failure. Follow up one to two weeks lp

## 2021-02-19 ENCOUNTER — Telehealth: Payer: Self-pay

## 2021-02-19 ENCOUNTER — Other Ambulatory Visit: Payer: Self-pay | Admitting: Legal Medicine

## 2021-02-19 DIAGNOSIS — Z955 Presence of coronary angioplasty implant and graft: Secondary | ICD-10-CM | POA: Diagnosis not present

## 2021-02-19 DIAGNOSIS — R0902 Hypoxemia: Secondary | ICD-10-CM | POA: Diagnosis not present

## 2021-02-19 DIAGNOSIS — Z7902 Long term (current) use of antithrombotics/antiplatelets: Secondary | ICD-10-CM | POA: Diagnosis not present

## 2021-02-19 DIAGNOSIS — Z7982 Long term (current) use of aspirin: Secondary | ICD-10-CM | POA: Diagnosis not present

## 2021-02-19 DIAGNOSIS — M1990 Unspecified osteoarthritis, unspecified site: Secondary | ICD-10-CM | POA: Diagnosis not present

## 2021-02-19 DIAGNOSIS — Z79899 Other long term (current) drug therapy: Secondary | ICD-10-CM | POA: Diagnosis not present

## 2021-02-19 DIAGNOSIS — I255 Ischemic cardiomyopathy: Secondary | ICD-10-CM | POA: Diagnosis not present

## 2021-02-19 DIAGNOSIS — J45909 Unspecified asthma, uncomplicated: Secondary | ICD-10-CM | POA: Diagnosis not present

## 2021-02-19 DIAGNOSIS — E785 Hyperlipidemia, unspecified: Secondary | ICD-10-CM | POA: Diagnosis not present

## 2021-02-19 DIAGNOSIS — E860 Dehydration: Secondary | ICD-10-CM | POA: Diagnosis not present

## 2021-02-19 DIAGNOSIS — I951 Orthostatic hypotension: Secondary | ICD-10-CM | POA: Diagnosis not present

## 2021-02-19 DIAGNOSIS — R42 Dizziness and giddiness: Secondary | ICD-10-CM | POA: Diagnosis not present

## 2021-02-19 DIAGNOSIS — I10 Essential (primary) hypertension: Secondary | ICD-10-CM | POA: Diagnosis not present

## 2021-02-19 DIAGNOSIS — I959 Hypotension, unspecified: Secondary | ICD-10-CM | POA: Diagnosis not present

## 2021-02-19 DIAGNOSIS — I2510 Atherosclerotic heart disease of native coronary artery without angina pectoris: Secondary | ICD-10-CM | POA: Diagnosis not present

## 2021-02-19 DIAGNOSIS — R531 Weakness: Secondary | ICD-10-CM | POA: Diagnosis not present

## 2021-02-19 DIAGNOSIS — N179 Acute kidney failure, unspecified: Secondary | ICD-10-CM | POA: Diagnosis not present

## 2021-02-19 DIAGNOSIS — Z8673 Personal history of transient ischemic attack (TIA), and cerebral infarction without residual deficits: Secondary | ICD-10-CM | POA: Diagnosis not present

## 2021-02-19 DIAGNOSIS — I5023 Acute on chronic systolic (congestive) heart failure: Secondary | ICD-10-CM

## 2021-02-19 DIAGNOSIS — Z950 Presence of cardiac pacemaker: Secondary | ICD-10-CM | POA: Diagnosis not present

## 2021-02-19 DIAGNOSIS — N289 Disorder of kidney and ureter, unspecified: Secondary | ICD-10-CM | POA: Diagnosis not present

## 2021-02-19 MED ORDER — SPIRONOLACTONE 25 MG PO TABS: 25.0000 mg | ORAL_TABLET | Freq: Every day | ORAL | 1 refills | Status: DC

## 2021-02-19 NOTE — Progress Notes (Signed)
Sodium is low from too much fluid in body, it should come up with getting rid of fluid.  Refer to renal for stage 3b renal disease. Add spironolactone qd. To get rid of fluid lp

## 2021-02-19 NOTE — Telephone Encounter (Signed)
Pt's spouse calling states pt's BP has been abnormal all day. States his BP is 104/40s. He cannot get up w/o dizziness, therefore not walking. Also states this was happening yesterday and did pass out. Wife questioned if he needed to go to hospital. Advised wife that he should be taken by EMS. Wife VU and will have him transported to [facility].   Royce Macadamia, [place] 02/19/21 3:09 PM

## 2021-02-20 ENCOUNTER — Other Ambulatory Visit: Payer: Self-pay

## 2021-02-20 DIAGNOSIS — N179 Acute kidney failure, unspecified: Secondary | ICD-10-CM | POA: Diagnosis not present

## 2021-02-20 DIAGNOSIS — Z950 Presence of cardiac pacemaker: Secondary | ICD-10-CM

## 2021-02-20 DIAGNOSIS — I951 Orthostatic hypotension: Secondary | ICD-10-CM | POA: Diagnosis not present

## 2021-02-20 DIAGNOSIS — E785 Hyperlipidemia, unspecified: Secondary | ICD-10-CM

## 2021-02-20 DIAGNOSIS — N1832 Chronic kidney disease, stage 3b: Secondary | ICD-10-CM

## 2021-02-20 DIAGNOSIS — I5023 Acute on chronic systolic (congestive) heart failure: Secondary | ICD-10-CM

## 2021-02-20 DIAGNOSIS — I10 Essential (primary) hypertension: Secondary | ICD-10-CM

## 2021-02-20 DIAGNOSIS — I255 Ischemic cardiomyopathy: Secondary | ICD-10-CM | POA: Diagnosis not present

## 2021-02-20 DIAGNOSIS — I2510 Atherosclerotic heart disease of native coronary artery without angina pectoris: Secondary | ICD-10-CM | POA: Diagnosis not present

## 2021-02-20 MED ORDER — SPIRONOLACTONE 25 MG PO TABS: 25.0000 mg | ORAL_TABLET | Freq: Every day | ORAL | 1 refills | Status: DC

## 2021-02-20 NOTE — Telephone Encounter (Signed)
Refill sent to pharmacy.   

## 2021-02-21 DIAGNOSIS — Z950 Presence of cardiac pacemaker: Secondary | ICD-10-CM

## 2021-02-21 DIAGNOSIS — I2510 Atherosclerotic heart disease of native coronary artery without angina pectoris: Secondary | ICD-10-CM | POA: Diagnosis not present

## 2021-02-21 DIAGNOSIS — I951 Orthostatic hypotension: Secondary | ICD-10-CM | POA: Diagnosis not present

## 2021-02-21 DIAGNOSIS — N289 Disorder of kidney and ureter, unspecified: Secondary | ICD-10-CM | POA: Diagnosis not present

## 2021-02-21 DIAGNOSIS — I255 Ischemic cardiomyopathy: Secondary | ICD-10-CM | POA: Diagnosis not present

## 2021-02-22 DIAGNOSIS — I2510 Atherosclerotic heart disease of native coronary artery without angina pectoris: Secondary | ICD-10-CM | POA: Diagnosis not present

## 2021-02-22 DIAGNOSIS — I951 Orthostatic hypotension: Secondary | ICD-10-CM | POA: Diagnosis not present

## 2021-02-22 DIAGNOSIS — N289 Disorder of kidney and ureter, unspecified: Secondary | ICD-10-CM | POA: Diagnosis not present

## 2021-02-22 DIAGNOSIS — I255 Ischemic cardiomyopathy: Secondary | ICD-10-CM | POA: Diagnosis not present

## 2021-02-24 ENCOUNTER — Other Ambulatory Visit: Payer: Self-pay | Admitting: Cardiology

## 2021-02-26 ENCOUNTER — Other Ambulatory Visit: Payer: Self-pay

## 2021-02-26 MED ORDER — HYDROCODONE-ACETAMINOPHEN 5-325 MG PO TABS: 1.0000 | ORAL_TABLET | Freq: Two times a day (BID) | ORAL | 0 refills | Status: DC | PRN

## 2021-02-26 NOTE — Progress Notes (Signed)
Remote pacemaker transmission.   

## 2021-03-02 ENCOUNTER — Other Ambulatory Visit: Payer: Self-pay

## 2021-03-02 ENCOUNTER — Ambulatory Visit (INDEPENDENT_AMBULATORY_CARE_PROVIDER_SITE_OTHER): Payer: PPO | Admitting: Legal Medicine

## 2021-03-02 ENCOUNTER — Encounter: Payer: Self-pay | Admitting: Legal Medicine

## 2021-03-02 VITALS — BP 152/58 | HR 83 | Temp 97.5°F | Ht 62.0 in | Wt 180.8 lb

## 2021-03-02 DIAGNOSIS — D6869 Other thrombophilia: Secondary | ICD-10-CM

## 2021-03-02 DIAGNOSIS — Z6833 Body mass index (BMI) 33.0-33.9, adult: Secondary | ICD-10-CM | POA: Diagnosis not present

## 2021-03-02 DIAGNOSIS — E039 Hypothyroidism, unspecified: Secondary | ICD-10-CM | POA: Diagnosis not present

## 2021-03-02 DIAGNOSIS — I10 Essential (primary) hypertension: Secondary | ICD-10-CM | POA: Diagnosis not present

## 2021-03-02 DIAGNOSIS — S22060A Wedge compression fracture of T7-T8 vertebra, initial encounter for closed fracture: Secondary | ICD-10-CM | POA: Diagnosis not present

## 2021-03-02 DIAGNOSIS — S22069A Unspecified fracture of T7-T8 vertebra, initial encounter for closed fracture: Secondary | ICD-10-CM | POA: Insufficient documentation

## 2021-03-02 DIAGNOSIS — I5023 Acute on chronic systolic (congestive) heart failure: Secondary | ICD-10-CM

## 2021-03-02 MED ORDER — ATORVASTATIN CALCIUM 80 MG PO TABS
40.0000 mg | ORAL_TABLET | Freq: Every day | ORAL | 3 refills | Status: AC
Start: 2021-03-02 — End: ?

## 2021-03-02 MED ORDER — MORPHINE SULFATE 30 MG PO TABS
30.0000 mg | ORAL_TABLET | ORAL | 0 refills | Status: AC | PRN
Start: 2021-03-02 — End: ?

## 2021-03-02 NOTE — Progress Notes (Signed)
Established Patient Office Visit  Subjective:  Patient ID: [name], male    DOB: Sep 19, 1934  Age: 86 y.o. MRN: [id number]  CC:  Chief Complaint  Patient presents with   Follow-up    2 week follow up    HPI [name] presents for hypertension.he is here for transition of care and reconciliation of medicines.  He was hospitalized 02/19/2021 for othrostatic hypotension, ARF, CHF. BNP 10200.  Spironolactone stopped and BP increased. Sodium 131, creatinine 1.60.gave fluids and sent home.  Dr Harriet Masson saw him. He was discharges on 02/22/2021.  H has pain 10/10 and sleeps in recliner.  He is using hydrcodone 2 day. Weak and only walking with [place].  He continues in chronic failure.  Patient remains in a very poor state he has only his wife to take care of him and does not want anyone else.  He had [facility] as a nurse help but they discharged him so we tried to get [facility] but they were told he was still on [facility] as roster.  I discussed with him the possibility of hospice since he is definitely moribund and has less than 6 months to live.  Both patient and wife says he does have advanced directives but I am unable to find any of these in the chart.  I have been unable to get cardiology to see him before mid November since they are going through staffing changes.  He is in chronic pain has he says 100 out of 10 he is using 2-2-1/2 hydrocodone a day to simplify his care I am going to try to put him on morphine sulfate 30 mg twice a day if it is too much his wife start time 15 mg twice a day and use hydrocodone as needed.  He has 3 thoracic compression fractures which are causing the major part of the pain and I referred her to Dr. Donivan Scull hopefully he may be able to get kyphoplasty or some sort of nerve block to assist this.  He is sleeping in a recliner is having great difficulty walking at all due to weakness even with his Rollator.  His wife is very concerned about it and patient is  refusing extra help.  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 moderate nonobstructive CAD by cath in 2018.   Coronary atherosclerosis of native coronary artery 04/09/2019   Mid RCA lesion, 60 %stenosed. Prox LAD lesion, 40 %stenosed. Prox LAD to Mid LAD lesion, 50 %stenosed. LPDA lesion, 60 %stenosed.   Disorder of carotid artery ([place]) 12/29/2016   Dyslipidemia 12/29/2016   Essential hypertension, benign 08/15/2017   Exertional angina ([place]) 02/08/2017   Gastritis and gastroduodenitis    GERD (gastroesophageal reflux disease)    Hypothyroidism    Iron deficiency anemia    Late effect of cerebrovascular accident (CVA) 05/25/2017   Late effects of cerebrovascular disease 05/25/2017   Malignant neoplasm of prostate ([place]) 08/17/2019   Mixed hyperlipidemia 12/14/2019   Obesity 01/25/2018   Osteoarthritis of both knees 08/17/2019  Pacemaker 08/08/2019   PAF (paroxysmal atrial fibrillation) (HCC)    Paroxysmal atrial fibrillation ([place]) 04/01/2015   Peripheral vascular disease ([place])    Polyp of cecum    Precordial pain 04/23/2015   Preinfarction syndrome ([place]) 02/08/2017   Right-sided carotid artery disease ([place]) 12/29/2016   Routine general medical examination at a health care facility 04/01/2015   Sequela of cerebrovascular accident 05/25/2017   Symptomatic anemia 04/12/2019    Past Surgical History:  Procedure Laterality Date   BIOPSY  04/11/2019   Procedure: BIOPSY;  Surgeon: Milus Banister, MD;  Location: [facility];  Service: Endoscopy;;   CATARACT  EXTRACTION     COLONOSCOPY WITH PROPOFOL N/A 04/11/2019   Procedure: COLONOSCOPY WITH PROPOFOL;  Surgeon: Milus Banister, MD;  Location: [facility] ENDOSCOPY;  Service: Endoscopy;  Laterality: N/A;   CORONARY ATHERECTOMY N/A 02/04/2021   Procedure: CORONARY ATHERECTOMY;  Surgeon: Sherren Mocha, MD;  Location: [facility];  Service: Cardiovascular;  Laterality: N/A;   CORONARY STENT INTERVENTION N/A 02/04/2021   Procedure: CORONARY STENT INTERVENTION;  Surgeon: Sherren Mocha, MD;  Location: [place] [facility];  Service: Cardiovascular;  Laterality: N/A;   ESOPHAGOGASTRODUODENOSCOPY (EGD) WITH PROPOFOL N/A 04/11/2019   Procedure: ESOPHAGOGASTRODUODENOSCOPY (EGD) WITH PROPOFOL;  Surgeon: Milus Banister, MD;  Location: [facility] ENDOSCOPY;  Service: Endoscopy;  Laterality: N/A;   HIP SURGERY     INSERT / REPLACE / REMOVE PACEMAKER     [employer]   INTRAVASCULAR ULTRASOUND/IVUS N/A 02/04/2021   Procedure: Intravascular Ultrasound/IVUS;  Surgeon: Sherren Mocha, MD;  Location: [facility];  Service: Cardiovascular;  Laterality: N/A;   JOINT REPLACEMENT Right 10/20/1994   Hip  (Left Hip in 10/96)   LEFT HEART CATH AND CORONARY ANGIOGRAPHY N/A 02/08/2017   Procedure: LEFT HEART CATH AND CORONARY ANGIOGRAPHY;  Surgeon: Martinique, Peter M, MD;  Location: [facility] LAB;  Service: Cardiovascular;  Laterality: N/A;   ORIF PERIPROSTHETIC FRACTURE Right 08/17/2017   Procedure: OPEN REDUCTION INTERNAL FIXATION (ORIF) PERIPROSTHETIC FRACTURE RIGHT FEMUR;  Surgeon: Paralee Cancel, MD;  Location: [facility];  Service: Orthopedics;  Laterality: Right;   ORIF PERIPROSTHETIC FRACTURE Right 01/23/2018   Procedure: Redo open reduction internal fixation right periprosthetic femur fracture;  Surgeon: Paralee Cancel, MD;  Location: [facility];  Service: Orthopedics;  Laterality: Right;  120 mins   POLYPECTOMY  04/11/2019   Procedure: POLYPECTOMY;  Surgeon: Milus Banister, MD;  Location: [facility] ENDOSCOPY;  Service: Endoscopy;;    RIGHT/LEFT HEART CATH AND CORONARY ANGIOGRAPHY N/A 02/02/2021   Procedure: RIGHT/LEFT HEART CATH AND CORONARY ANGIOGRAPHY;  Surgeon: Nelva Bush, MD;  Location: [place] [facility] LAB;  Service: Cardiovascular;  Laterality: N/A;    Family History  Problem Relation Age of Onset   Heart attack Mother     Social History   Socioeconomic History   Marital status: Married    Spouse name: Angie Hogg   Number of children: 2   Years of education: Not on file   Highest education level: 6th grade  Occupational History   Occupation: Retired  Tobacco Use   Smoking status: Never   Smokeless tobacco: Current    Types: Snuff   Tobacco comments:    1 can a month  Vaping Use   Vaping Use: Never used  Substance and Sexual Activity   Alcohol use: Yes    Alcohol/week: 3.0 standard drinks    Types: 3 Cans of beer per week    Comment: regular use 4 times a week  Drug use: No   Sexual activity: Not Currently    Partners: Female    Birth control/protection: None  Other Topics Concern   Not on file  Social History Narrative   Not on file   Social Determinants of Health   Financial Resource Strain: Low Risk    Difficulty of Paying Living Expenses: Not hard at all  Food Insecurity: No Food Insecurity   Worried About Charity fundraiser in the Last Year: Never true   [place] in the Last Year: Never true  Transportation Needs: No Transportation Needs   Lack of Transportation (Medical): No   Lack of Transportation (Non-Medical): No  Physical Activity: Sufficiently Active   Days of Exercise per Week: 4 days   Minutes of Exercise per Session: 60 min  Stress: No Stress Concern Present   Feeling of Stress : Only a little  Social Connections: Moderately Isolated   Frequency of Communication with Friends and Family: Never   Frequency of Social Gatherings with Friends and Family: Never   Attends Religious Services: 1 to 4 times per year   Active Member of [employer] or Organizations: No    Attends Music therapist: Never   Marital Status: Married  Human resources officer Violence: Not At Risk   Fear of Current or Ex-Partner: No   Emotionally Abused: No   Physically Abused: No   Sexually Abused: No    Outpatient Medications Prior to Visit  Medication Sig Dispense Refill   albuterol (VENTOLIN HFA) 108 (90 Base) MCG/ACT inhaler INHALE 2 PUFFS BY MOUTH EVERY 6 HOURS AS NEEDED (Patient taking differently: Inhale 2 puffs into the lungs every 6 (six) hours as needed for wheezing or shortness of breath.) 8.5 g 6   apixaban (ELIQUIS) 5 MG TABS tablet Take 1 tablet (5 mg total) by mouth 2 (two) times daily. 60 tablet 6   aspirin 81 MG EC tablet Take 1 tablet (81 mg total) by mouth daily. Swallow whole. 30 tablet 11   clopidogrel (PLAVIX) 75 MG tablet Take 1 tablet (75 mg total) by mouth daily. 90 tablet 3   empagliflozin (JARDIANCE) 10 MG TABS tablet Take 1 tablet (10 mg total) by mouth daily before breakfast. 30 tablet 11   FEROSUL 325 (65 Fe) MG tablet TAKE 1 TABLET(325 MG) BY MOUTH TWICE DAILY 100 tablet 2   fluticasone (FLOVENT HFA) 110 MCG/ACT inhaler Inhale 2 puffs into the lungs 2 (two) times daily. 12 g 6   HYDROcodone-acetaminophen (NORCO/VICODIN) 5-325 MG tablet Take 1 tablet by mouth 2 (two) times daily as needed for moderate pain. 60 tablet 0   levothyroxine (SYNTHROID) 50 MCG tablet TAKE 1 TABLET BY MOUTH DAILY 90 tablet 2   Multiple Vitamin (MULTIVITAMIN WITH MINERALS) TABS tablet Take 1 tablet by mouth daily.     nitroGLYCERIN (NITROSTAT) 0.4 MG SL tablet DISSOLVE 1 TABLET UNDER THE TONGUE EVERY 5 MINUTES AS NEEDED FOR CHEST PAIN. MAX OF 3 DOSES IN 15 MINUTES 25 tablet 3   pantoprazole (PROTONIX) 40 MG tablet Take 1 tablet (40 mg total) by mouth daily. 90 tablet 3   ranolazine (RANEXA) 500 MG 12 hr tablet TAKE 1 TABLET(500 MG) BY MOUTH TWICE DAILY 180 tablet 2   tamsulosin (FLOMAX) 0.4 MG CAPS capsule Take 1 capsule (0.4 mg total) by mouth daily. 30 capsule 3    vitamin B-12 (CYANOCOBALAMIN) 1000 MCG tablet Take 1,000 mcg by mouth daily.      atorvastatin (LIPITOR) 80 MG tablet Take 1  tablet (80 mg total) by mouth daily. 90 tablet 3   furosemide (LASIX) 20 MG tablet Take 1 tablet (20 mg total) by mouth daily. 30 tablet 11   metoprolol tartrate (LOPRESSOR) 25 MG tablet Take 1 tablet (25 mg total) by mouth 2 (two) times daily. 180 tablet 3   spironolactone (ALDACTONE) 25 MG tablet Take 1 tablet (25 mg total) by mouth daily. 90 tablet 1   metoprolol succinate (TOPROL-XL) 25 MG 24 hr tablet Take 12.5 mg by mouth at bedtime.     No facility-administered medications prior to visit.    No Known Allergies  ROS Review of Systems  Constitutional:  Negative for chills, fatigue and fever.  HENT:  Negative for congestion, ear pain and sore throat.   Respiratory:  Positive for cough and shortness of breath.   Cardiovascular:  Positive for leg swelling. Negative for chest pain.  Gastrointestinal:  Negative for abdominal pain, constipation, diarrhea, nausea and vomiting.  Endocrine: Negative for polydipsia, polyphagia and polyuria.  Genitourinary:  Negative for dysuria and frequency.  Musculoskeletal:  Negative for arthralgias, joint swelling and myalgias.  Neurological:  Negative for dizziness and headaches.  Psychiatric/Behavioral:  Negative for dysphoric mood.        No dysphoria     Objective:    Physical Exam Vitals reviewed.  Constitutional:      Appearance: Normal appearance.  HENT:     Right Ear: Tympanic membrane normal.     Left Ear: Tympanic membrane normal.     Mouth/Throat:     Mouth: Mucous membranes are moist.     Pharynx: Oropharynx is clear.  Eyes:     Extraocular Movements: Extraocular movements intact.     Conjunctiva/sclera: Conjunctivae normal.     Pupils: Pupils are equal, round, and reactive to light.  Cardiovascular:     Rate and Rhythm: Normal rate and regular rhythm.     Pulses: Normal pulses.     Heart sounds: Normal  heart sounds. No murmur heard.   No gallop.  Pulmonary:     Effort: Pulmonary effort is normal. No respiratory distress.     Breath sounds: Rhonchi present. No rales.  Abdominal:     General: Abdomen is flat. Bowel sounds are normal. There is no distension.     Tenderness: There is no abdominal tenderness.  Musculoskeletal:        General: Normal range of motion.     Cervical back: Normal range of motion and neck supple.     Right lower leg: No edema.     Left lower leg: No edema.  Skin:    Capillary Refill: Capillary refill takes less than 2 seconds.  Neurological:     General: No focal deficit present.     Mental Status: He is alert and oriented to person, place, and time.    BP (!) 152/58   Pulse 83   Temp (!) 97.5 F (36.4 C)   Ht 5' 2"  (1.575 m)   Wt 180 lb 12.8 oz (82 kg)   SpO2 95%   BMI 33.07 kg/m  Wt Readings from Last 3 Encounters:  03/02/21 180 lb 12.8 oz (82 kg)  02/17/21 175 lb (79.4 kg)  02/10/21 174 lb 9.6 oz (79.2 kg)     Health Maintenance Due  Topic Date Due   TETANUS/TDAP  Never done   Zoster Vaccines- Shingrix (1 of 2) Never done   PNA vac Low Risk Adult (1 of 2 - PCV13) Never done  COVID-19 Vaccine (2 - Janssen risk series) 10/25/2019   INFLUENZA VACCINE  Never done    There are no preventive care reminders to display for this patient.  Lab Results  Component Value Date   TSH 4.730 (H) 02/17/2021   Lab Results  Component Value Date   WBC 8.2 02/17/2021   HGB 11.8 (L) 02/17/2021   HCT 36.1 (L) 02/17/2021   MCV 90 02/17/2021   PLT 427 02/17/2021   Lab Results  Component Value Date   NA 133 (L) 02/17/2021   K 4.8 02/17/2021   CO2 23 02/17/2021   GLUCOSE 118 (H) 02/17/2021   BUN 29 (H) 02/17/2021   CREATININE 1.63 (H) 02/17/2021   BILITOT 0.4 02/17/2021   ALKPHOS 79 02/17/2021   AST 18 02/17/2021   ALT 15 02/17/2021   PROT 7.0 02/17/2021   ALBUMIN 4.3 02/17/2021   CALCIUM 9.8 02/17/2021   ANIONGAP 11 02/05/2021   EGFR 42 (L)  02/17/2021   Lab Results  Component Value Date   CHOL 168 02/17/2021   Lab Results  Component Value Date   HDL 43 02/17/2021   Lab Results  Component Value Date   LDLCALC 92 02/17/2021   Lab Results  Component Value Date   TRIG 195 (H) 02/17/2021   Lab Results  Component Value Date   CHOLHDL 3.9 02/17/2021   Lab Results  Component Value Date   HGBA1C  07/07/2008    5.8 (NOTE)   The ADA recommends the following therapeutic goal for glycemic   control related to Hgb A1C measurement:   Goal of Therapy:   < 7.0% Hgb A1C   Reference: American Diabetes Association: Clinical Practice   Recommendations 2008, Diabetes Care,  2008, 31:(Suppl 1).      Assessment & Plan:   Diagnoses and all orders for this visit: Essential hypertension, benign -     atorvastatin (LIPITOR) 80 MG tablet; Take 0.5 tablets (40 mg total) by mouth daily. Patient's blood pressure has risen and he has been stable with a systolic of 710.  Apparently had a syncopal episode prior to the other hospitalization was load noted to have low blood pressure.  We will sustain this blood pressure at the present time since it he seems to be getting good perfusion. Closed wedge compression fracture of T8 vertebra, initial encounter (HCC) -     AMB referral to orthopedics -     morphine (MSIR) 30 MG tablet; Take 1 tablet (30 mg total) by mouth every 4 (four) hours as needed for severe pain. Patient was found on chest x-ray to have 1 high thoracic vertebrae compression fracture into low around T8-12.  He is on hydrocodone with very little relief.  He does have Linzess for diarrhea we discussed Relistor for constipation and is is out of his price range  Acute on chronic HFrEF (heart failure with reduced ejection fraction) (HCC) -     Comprehensive metabolic panel -     Pro b natriuretic peptide Left ventricular ejection fraction by 2D echo was estimated to be 35 to 40% he has multiple 50% lesions in his coronary arteries and  he has grade 3 diastolic dysfunction.  This would make him extremely weak and have very poor exercise tolerance Hypothyroidism, unspecified type -     TSH Patient was found to have an elevated TSH in the last hospitalization and we will repeat. Acquired thrombophilia ([place]) Has acquired thrombophilia is on Eliquis 5 mg twice a day for his chronic atrial  fibrillation.  BMI 33.0-33.9,adult  An individualize plan was formulated for obesity using patient history and physical exam to encourage weight loss.  An evidence based program was formulated.  Patient is to cut portion size with meals and to plan physical exercise 3 days a week at least 20 minutes.  Weight watchers and other programs are helpful.  Planned amount of weight loss 0 lbs.   Overall patient is in very poor condition and really could use hospice care I discussed this at length with them he presently is a full code and we will need to discuss changing this also.  I will continue to try to get some home health to help [place] but patient seems to be refusing this.  His but especially difficult burden on his wife.  Patient does have a life expectancy of less than 6 months due to cardiac conditions.  We will try to keep him comfortable trying to use morphine sulfate and see if we can get him a kyphoplasty to help out with his compression fractures.  Follow-up in 1 week.  I have spent 45 minutes with patient today trying to review his records and give him some relief and understanding of the situation.  Reviewed his old records extensively also.   Follow-up: Return in about 1 week (around 03/09/2021) for chf.    Reinaldo Meeker, MD

## 2021-03-03 ENCOUNTER — Other Ambulatory Visit: Payer: Self-pay | Admitting: Legal Medicine

## 2021-03-03 DIAGNOSIS — E039 Hypothyroidism, unspecified: Secondary | ICD-10-CM

## 2021-03-03 LAB — COMPREHENSIVE METABOLIC PANEL
ALT: 8 IU/L (ref 0–44)
AST: 15 IU/L (ref 0–40)
Albumin/Globulin Ratio: 1.7 (ref 1.2–2.2)
Albumin: 3.9 g/dL (ref 3.6–4.6)
Alkaline Phosphatase: 94 IU/L (ref 44–121)
BUN/Creatinine Ratio: 11 (ref 10–24)
BUN: 17 mg/dL (ref 8–27)
Bilirubin Total: 0.3 mg/dL (ref 0.0–1.2)
CO2: 22 mmol/L (ref 20–29)
Calcium: 9.1 mg/dL (ref 8.6–10.2)
Chloride: 99 mmol/L (ref 96–106)
Creatinine, Ser: 1.55 mg/dL — ABNORMAL HIGH (ref 0.76–1.27)
Globulin, Total: 2.3 g/dL (ref 1.5–4.5)
Glucose: 127 mg/dL — ABNORMAL HIGH (ref 65–99)
Potassium: 5.2 mmol/L (ref 3.5–5.2)
Sodium: 134 mmol/L (ref 134–144)
Total Protein: 6.2 g/dL (ref 6.0–8.5)
eGFR: 44 mL/min/{1.73_m2} — ABNORMAL LOW (ref >59)

## 2021-03-03 LAB — TSH: TSH: 3.59 u[IU]/mL (ref 0.450–4.500)

## 2021-03-03 LAB — PRO B NATRIURETIC PEPTIDE: NT-Pro BNP: 6311 pg/mL — ABNORMAL HIGH (ref 0–486)

## 2021-03-03 MED ORDER — LEVOTHYROXINE SODIUM 75 MCG PO TABS
75.0000 ug | ORAL_TABLET | Freq: Every day | ORAL | 2 refills | Status: AC
Start: 2021-03-03 — End: ?

## 2021-03-03 NOTE — Progress Notes (Signed)
Glucose 127, kidney stage 3b, sodium 134, potassium,, 5.2, liver tests good, TSH 4.730 high, NT-Pro BNP 6,311 down. New dose thyroid called in lp

## 2021-03-05 DIAGNOSIS — S22080A Wedge compression fracture of T11-T12 vertebra, initial encounter for closed fracture: Secondary | ICD-10-CM | POA: Diagnosis not present

## 2021-03-05 DIAGNOSIS — S32010A Wedge compression fracture of first lumbar vertebra, initial encounter for closed fracture: Secondary | ICD-10-CM | POA: Diagnosis not present

## 2021-03-06 DIAGNOSIS — M545 Low back pain, unspecified: Secondary | ICD-10-CM | POA: Diagnosis not present

## 2021-03-06 DIAGNOSIS — S32010A Wedge compression fracture of first lumbar vertebra, initial encounter for closed fracture: Secondary | ICD-10-CM | POA: Diagnosis not present

## 2021-03-06 DIAGNOSIS — S22080A Wedge compression fracture of T11-T12 vertebra, initial encounter for closed fracture: Secondary | ICD-10-CM | POA: Diagnosis not present

## 2021-03-09 ENCOUNTER — Ambulatory Visit: Payer: PPO | Admitting: Legal Medicine

## 2021-03-10 DIAGNOSIS — S22080A Wedge compression fracture of T11-T12 vertebra, initial encounter for closed fracture: Secondary | ICD-10-CM | POA: Diagnosis not present

## 2021-03-10 DIAGNOSIS — S32010A Wedge compression fracture of first lumbar vertebra, initial encounter for closed fracture: Secondary | ICD-10-CM | POA: Diagnosis not present

## 2021-03-20 ENCOUNTER — Other Ambulatory Visit: Payer: Self-pay

## 2021-03-20 MED ORDER — HYDROCODONE-ACETAMINOPHEN 5-325 MG PO TABS: 1.0000 | ORAL_TABLET | Freq: Two times a day (BID) | ORAL | 0 refills | Status: AC | PRN

## 2021-03-25 ENCOUNTER — Other Ambulatory Visit: Payer: Self-pay | Admitting: Cardiology

## 2021-03-26 NOTE — Telephone Encounter (Signed)
Nitrostat 0.4 mg SL tablet # 25 only, patient has appointment 05/13/21 .  sent to   [facility] [place] - [address] AT [address]

## 2021-03-30 ENCOUNTER — Telehealth: Payer: Self-pay

## 2021-03-30 NOTE — Chronic Care Management (AMB) (Signed)
Chronic Care Management Pharmacy Assistant   Name: [name]  MRN: [id number] DOB: 17-Oct-1934  Reason for Encounter: Disease State call for COPD and check on Patient Assistance for inhalers   Recent office visits:  03/03/21 Orders Only. Scott Meeker MD. Ordered Levothyroxine 75 mcg daily.  03/02/21 Scott Meeker MD. Seen for HTN.  Referral made to orthopedic. Started Morphine Sulfate 30 mg every 4 hours prn. Decreased atorvastatin calcium 80 mg daily to 40 mg daily. Stopped the Spironolactone 25 mg daily. Stopped the furosemide 20 mg daily. Changed the metoprolol 25 mg 2 times daily to 25 mg take 12.5 mg at bedtime. Follow up in 1 week.  02/17/21 Scott Meeker MD. Seen for routine visit. Started Tamsulosin 0.4 mg daily. Reordered Hydrocodone-Acetaminophen 5-325 mg 1 2 times daily prn. Instructions included to cut down metoprolol to once a day. Follow up in 2 weeks.  Recent consult visits:  None since 02/12/21  Hospital visits:  None since 02/12/21  Medications: Outpatient Encounter Medications as of 03/30/2021  Medication Sig   albuterol (VENTOLIN HFA) 108 (90 Base) MCG/ACT inhaler INHALE 2 PUFFS BY MOUTH EVERY 6 HOURS AS NEEDED (Patient taking differently: Inhale 2 puffs into the lungs every 6 (six) hours as needed for wheezing or shortness of breath.)   apixaban (ELIQUIS) 5 MG TABS tablet Take 1 tablet (5 mg total) by mouth 2 (two) times daily.   aspirin 81 MG EC tablet Take 1 tablet (81 mg total) by mouth daily. Swallow whole.   atorvastatin (LIPITOR) 80 MG tablet Take 0.5 tablets (40 mg total) by mouth daily.   clopidogrel (PLAVIX) 75 MG tablet Take 1 tablet (75 mg total) by mouth daily.   empagliflozin (JARDIANCE) 10 MG TABS tablet Take 1 tablet (10 mg total) by mouth daily before breakfast.   FEROSUL 325 (65 Fe) MG tablet TAKE 1 TABLET(325 MG) BY MOUTH TWICE DAILY   fluticasone (FLOVENT HFA) 110 MCG/ACT inhaler Inhale 2 puffs into the lungs 2 (two) times daily.    HYDROcodone-acetaminophen (NORCO/VICODIN) 5-325 MG tablet Take 1 tablet by mouth 2 (two) times daily as needed for moderate pain.   levothyroxine (SYNTHROID) 75 MCG tablet Take 1 tablet (75 mcg total) by mouth daily.   metoprolol succinate (TOPROL-XL) 25 MG 24 hr tablet Take 12.5 mg by mouth at bedtime.   morphine (MSIR) 30 MG tablet Take 1 tablet (30 mg total) by mouth every 4 (four) hours as needed for severe pain.   Multiple Vitamin (MULTIVITAMIN WITH MINERALS) TABS tablet Take 1 tablet by mouth daily.   nitroGLYCERIN (NITROSTAT) 0.4 MG SL tablet DISSOLVE 1 TABLET UNDER THE TONGUE EVERY 5 MINUTES AS NEEDED FOR CHEST PAIN. MAX OF 3 DOSES IN 15 MINUTES   pantoprazole (PROTONIX) 40 MG tablet Take 1 tablet (40 mg total) by mouth daily.   ranolazine (RANEXA) 500 MG 12 hr tablet TAKE 1 TABLET(500 MG) BY MOUTH TWICE DAILY   tamsulosin (FLOMAX) 0.4 MG CAPS capsule Take 1 capsule (0.4 mg total) by mouth daily.   vitamin B-12 (CYANOCOBALAMIN) 1000 MCG tablet Take 1,000 mcg by mouth daily.    No facility-administered encounter medications on file as of 03/30/2021.    Current COPD regimen:  Flovent inhaler 110 MCG/ACT inhale 2 puffs 2 times daily  Albuterol Inhaler 108 MCG/ACT Inhale 2 puffs every 6 hours as needed  Any recent hospitalizations or ED visits since last visit with CPP? No  reports the following COPD symptoms, including Increased shortness of breath but it has  resolved    What recent interventions/DTPs have been made by any provider to improve breathing since last visit: No changes    Have you had exacerbation/flare-up since last visit? Yes  What do you do when you are short of breath?  Rescue medication, rest  Current tobacco use? No  Interested in cessation? No  Respiratory Devices/Equipment Do you have a nebulizer? No Do you use a Peak Flow Meter? No Do you use a maintenance inhaler? Yes How often do you forget to use your daily inhaler? Never  Do you use a rescue inhaler?  Yes How often do you use your rescue inhaler?  prn Do you use a spacer with your inhaler? No  Adherence Review: Does the patient have >5 day gap between last estimated fill date for maintenance inhaler medications? No last fill was on 03/26/21 for 25 days for Albuterol inhaler and Flovent Inhaler was filled on 03/26/21 for 30 days  Care Gaps: Last annual wellness visit? Done on 06/25/20  Patient Assistance update: Pt has met his deductible but cannot process his application due to the signature on Page 4 is outdated. It cannot be more than 27 days old. So we need to mail out page 4 of the application and have patient sign and date and refax it back to [place]. Pt wife has been informed.    [name], [place] Pharmacist Assistant  [phone]

## 2021-04-21 DEATH — deceased

## 2021-05-13 ENCOUNTER — Ambulatory Visit: Payer: PPO | Admitting: Cardiology

## 2021-09-09 ENCOUNTER — Telehealth: Payer: PPO
